# Patient Record
Sex: Female | Born: 1955
Health system: Southern US, Community
[De-identification: ages and names within clinical notes are randomized; demographics above are authoritative.]

## PROBLEM LIST (undated history)

## (undated) DIAGNOSIS — A048 Other specified bacterial intestinal infections: Secondary | ICD-10-CM

## (undated) DIAGNOSIS — H9313 Tinnitus, bilateral: Secondary | ICD-10-CM

## (undated) DIAGNOSIS — Z85038 Personal history of other malignant neoplasm of large intestine: Secondary | ICD-10-CM

## (undated) DIAGNOSIS — M5030 Other cervical disc degeneration, unspecified cervical region: Secondary | ICD-10-CM

## (undated) DIAGNOSIS — K2100 Gastro-esophageal reflux disease with esophagitis, without bleeding: Secondary | ICD-10-CM

## (undated) DIAGNOSIS — S5290XA Unspecified fracture of unspecified forearm, initial encounter for closed fracture: Secondary | ICD-10-CM

## (undated) DIAGNOSIS — C50919 Malignant neoplasm of unspecified site of unspecified female breast: Secondary | ICD-10-CM

## (undated) DIAGNOSIS — C189 Malignant neoplasm of colon, unspecified: Secondary | ICD-10-CM

## (undated) DIAGNOSIS — M51369 Other intervertebral disc degeneration, lumbar region without mention of lumbar back pain or lower extremity pain: Secondary | ICD-10-CM

## (undated) DIAGNOSIS — H409 Unspecified glaucoma: Secondary | ICD-10-CM

## (undated) DIAGNOSIS — K648 Other hemorrhoids: Secondary | ICD-10-CM

## (undated) DIAGNOSIS — M5136 Other intervertebral disc degeneration, lumbar region: Secondary | ICD-10-CM

## (undated) DIAGNOSIS — E785 Hyperlipidemia, unspecified: Secondary | ICD-10-CM

## (undated) DIAGNOSIS — Z86010 Personal history of colonic polyps: Secondary | ICD-10-CM

## (undated) DIAGNOSIS — M47814 Spondylosis without myelopathy or radiculopathy, thoracic region: Secondary | ICD-10-CM

## (undated) HISTORY — DX: Personal history of other malignant neoplasm of large intestine: Z85.038

## (undated) HISTORY — DX: Malignant neoplasm of unspecified site of unspecified female breast: C50.919

## (undated) HISTORY — DX: Gastro-esophageal reflux disease with esophagitis: K21.0

## (undated) HISTORY — PX: TONSILLECTOMY: SUR1361

## (undated) HISTORY — DX: Malignant neoplasm of colon, unspecified: C18.9

## (undated) HISTORY — DX: Gastro-esophageal reflux disease with esophagitis, without bleeding: K21.00

## (undated) HISTORY — DX: Other specified bacterial intestinal infections: A04.8

## (undated) HISTORY — DX: Other hemorrhoids: K64.8

## (undated) HISTORY — DX: Unspecified glaucoma: H40.9

---

## 1998-12-06 ENCOUNTER — Other Ambulatory Visit: Admission: RE | Admit: 1998-12-06 | Discharge: 1998-12-06 | Payer: Self-pay | Admitting: Obstetrics and Gynecology

## 1999-07-05 ENCOUNTER — Inpatient Hospital Stay (HOSPITAL_COMMUNITY): Admission: AD | Admit: 1999-07-05 | Discharge: 1999-07-07 | Payer: Self-pay | Admitting: Obstetrics and Gynecology

## 1999-08-06 ENCOUNTER — Other Ambulatory Visit: Admission: RE | Admit: 1999-08-06 | Discharge: 1999-08-06 | Payer: Self-pay | Admitting: Obstetrics and Gynecology

## 2000-08-12 ENCOUNTER — Other Ambulatory Visit: Admission: RE | Admit: 2000-08-12 | Discharge: 2000-08-12 | Payer: Self-pay | Admitting: Obstetrics and Gynecology

## 2000-08-21 ENCOUNTER — Encounter: Admission: RE | Admit: 2000-08-21 | Discharge: 2000-08-21 | Payer: Self-pay | Admitting: Obstetrics and Gynecology

## 2000-08-21 ENCOUNTER — Encounter: Payer: Self-pay | Admitting: Obstetrics and Gynecology

## 2001-09-13 ENCOUNTER — Other Ambulatory Visit: Admission: RE | Admit: 2001-09-13 | Discharge: 2001-09-13 | Payer: Self-pay | Admitting: Obstetrics and Gynecology

## 2001-12-08 ENCOUNTER — Encounter: Admission: RE | Admit: 2001-12-08 | Discharge: 2001-12-08 | Payer: Self-pay | Admitting: Obstetrics and Gynecology

## 2001-12-08 ENCOUNTER — Encounter: Payer: Self-pay | Admitting: Obstetrics and Gynecology

## 2002-09-26 ENCOUNTER — Other Ambulatory Visit: Admission: RE | Admit: 2002-09-26 | Discharge: 2002-09-26 | Payer: Self-pay | Admitting: Obstetrics and Gynecology

## 2003-05-08 ENCOUNTER — Encounter: Admission: RE | Admit: 2003-05-08 | Discharge: 2003-05-08 | Payer: Self-pay | Admitting: Obstetrics and Gynecology

## 2003-09-22 ENCOUNTER — Encounter: Payer: Self-pay | Admitting: Internal Medicine

## 2003-09-22 DIAGNOSIS — K648 Other hemorrhoids: Secondary | ICD-10-CM | POA: Insufficient documentation

## 2003-12-13 ENCOUNTER — Other Ambulatory Visit: Admission: RE | Admit: 2003-12-13 | Discharge: 2003-12-13 | Payer: Self-pay | Admitting: Obstetrics and Gynecology

## 2004-07-07 DIAGNOSIS — S5290XA Unspecified fracture of unspecified forearm, initial encounter for closed fracture: Secondary | ICD-10-CM

## 2004-07-07 HISTORY — DX: Unspecified fracture of unspecified forearm, initial encounter for closed fracture: S52.90XA

## 2004-07-07 HISTORY — PX: WRIST FRACTURE SURGERY: SHX121

## 2004-09-15 ENCOUNTER — Emergency Department (HOSPITAL_COMMUNITY): Admission: EM | Admit: 2004-09-15 | Discharge: 2004-09-15 | Payer: Self-pay | Admitting: Emergency Medicine

## 2005-09-02 ENCOUNTER — Encounter: Admission: RE | Admit: 2005-09-02 | Discharge: 2005-09-02 | Payer: Self-pay | Admitting: Obstetrics and Gynecology

## 2006-12-15 ENCOUNTER — Encounter: Admission: RE | Admit: 2006-12-15 | Discharge: 2006-12-15 | Payer: Self-pay | Admitting: Obstetrics and Gynecology

## 2007-05-11 ENCOUNTER — Ambulatory Visit: Payer: Self-pay | Admitting: Internal Medicine

## 2007-05-11 LAB — CONVERTED CEMR LAB
ALT: 20 units/L (ref 0–35)
Albumin: 3.4 g/dL — ABNORMAL LOW (ref 3.5–5.2)
Alkaline Phosphatase: 66 units/L (ref 39–117)
Lipase: 27 units/L (ref 11.0–59.0)

## 2007-05-17 ENCOUNTER — Ambulatory Visit: Payer: Self-pay | Admitting: Internal Medicine

## 2007-05-17 ENCOUNTER — Encounter: Payer: Self-pay | Admitting: Internal Medicine

## 2007-05-17 DIAGNOSIS — K2100 Gastro-esophageal reflux disease with esophagitis, without bleeding: Secondary | ICD-10-CM | POA: Insufficient documentation

## 2007-08-16 ENCOUNTER — Ambulatory Visit: Payer: Self-pay | Admitting: Internal Medicine

## 2007-08-27 DIAGNOSIS — K219 Gastro-esophageal reflux disease without esophagitis: Secondary | ICD-10-CM | POA: Insufficient documentation

## 2007-08-27 DIAGNOSIS — A048 Other specified bacterial intestinal infections: Secondary | ICD-10-CM | POA: Insufficient documentation

## 2007-12-22 ENCOUNTER — Encounter: Admission: RE | Admit: 2007-12-22 | Discharge: 2007-12-22 | Payer: Self-pay | Admitting: Obstetrics and Gynecology

## 2009-01-17 ENCOUNTER — Encounter: Admission: RE | Admit: 2009-01-17 | Discharge: 2009-01-17 | Payer: Self-pay | Admitting: Obstetrics and Gynecology

## 2010-06-04 ENCOUNTER — Encounter: Admission: RE | Admit: 2010-06-04 | Discharge: 2010-06-04 | Payer: Self-pay | Admitting: Obstetrics and Gynecology

## 2010-09-25 ENCOUNTER — Encounter: Payer: Self-pay | Admitting: Internal Medicine

## 2010-10-03 NOTE — Letter (Signed)
Summary: Colonoscopy Letter  [facility]  [address] [place], [place] [address]   Phone: [phone]  Fax: [phone]      September 25, 2010 MRN: [id number]   Wendy [name] [address] [place], [place]  [address]   Dear Ms. Hafford,   According to your medical record, it is time for you to schedule a Colonoscopy. The American Cancer Society recommends this procedure as a method to detect early colon cancer. Patients with a family history of colon cancer, or a personal history of colon polyps or inflammatory bowel disease are at increased risk.  This letter has been generated based on the recommendations made at the time of your procedure. If you feel that in your particular situation this may no longer apply, please contact our office.  Please call our office at [phone] to schedule this appointment or to update your records at your earliest convenience.  Thank you for cooperating with [place] to provide you with the very best care possible.   Sincerely,  Hedwig Morton. Juanda Chance, M.D.  [facility] [phone]

## 2010-11-19 NOTE — Assessment & Plan Note (Signed)
[facility]                         GASTROENTEROLOGY OFFICE NOTE   BERNESE, DOFFING                     MRN:          [id number]  DATE:05/11/2007                            DOB:          02/16/1956    GASTROENTEROLOGY CONSULTATION   HISTORY OF PRESENT ILLNESS:  Ms. Scheu is a 55 year old female who is  referred by Dr. Neva Seat for evaluation of epigastric pain of several  months' duration.  Also, it is somewhat right upper quadrant without  radiation to the back.  She was treated for positive H. Pylori antibody  in August of 2008 with double therapy of Biaxin and Amoxicillin.  The  therapy, which lasted two weeks, did not have any impact on the presence  of her pain.  She denies dysphagia, symptoms of reflux, or change in  bowel habits.  The pain is sometimes  crampy and develops within two to  three hours of waking up in the  morning.  It may feel hot and it may  feel rather sensitive.  It is not associated with meals.  She denies any  food intolerance.  There is no family history of gallbladder disease.  Nexium did not help the pain.   MEDICATIONS:  1. Birth control pills.  2. Nexium 40 mg p.o. daily was discontinued.   PAST MEDICAL HISTORY:  Significant for colon cancer in her maternal  grandmother.  She underwent colonoscopy in March of 2005 by me which was  essentially normal.  Her past history is significant for tonsillectomy  and Cesarean section.  She has left arm fracture.   FAMILY HISTORY:  Positive for diabetes in mother and father.  Heart  disease in father.  Colon cancer in maternal grandmother.   SOCIAL HISTORY:  She is divorced with two boys; one of them died two  years ago in a motor vehicle accident.  She is a Engineer, civil (consulting).  She does not  drink alcohol.  She does not smoke.   REVIEW OF SYSTEMS:  Positive for  sleeping problems.   PHYSICAL EXAMINATION:  VITAL SIGNS:  Blood pressure  118/82. Pulse 64.  Weight 201 pounds.  GENERAL  APPEARANCE:  She was in no distress.  NECK:  Supple without lymphadenopathy.  LUNGS:  Clear to auscultation.  CARDIOVASCULAR: Normal S1 and S2.  ABDOMEN:  Soft.  Tender in the midline above the umbilicus without  radiation to left or right upper quadrant.  Liver edge was at costal  margin and there was no tenderness in the right upper quadrant.  No  costovertebral angle tenderness.  Lower abdomen was normal.  There were  no scars.  EXTREMITIES:  No edema.   IMPRESSION:  1. This is a 55 year old female with epigastric discomfort, not      responsive to H. Pylori treatment or acid-suppressing agents.  Rule      out spasm.  Rule out nonfunctional dyspepsia.  Rule out biliary      disease.  The patient thinks she had an upper abdominal ultrasound      in the past which showed normal  gallbladder.  There is no family  history of gallbladder disease.  2. Positive H. Pylori antibody, treated in August 2008.  3. Positive family history of colon cancer in grandparent.  She had a      normal colonoscopy in 2005.  4. Slightly abnormal serum amylase on testing by Dr. Neva Seat showing a      level of 107.   PLAN:  1. Upper endoscopy scheduled.  We will repeat the H. Pylori test.  2. Begin Levbid 0.375 mg p.o. b.i.d. for antispasmodic effect.  3. Depending upon the results of the endoscopy, she may need further      evaluation, repeat serum amylase level     Dora M. Juanda Chance, MD  Electronically Signed    DMB/MedQ  DD: 05/11/2007  DT: 05/12/2007  Job #: [id number]   cc:   Silas Sacramento, M.D.

## 2010-11-19 NOTE — Assessment & Plan Note (Signed)
[facility]                         GASTROENTEROLOGY OFFICE NOTE   Wendy, [name]                     MRN:          [id number]  DATE:08/16/2007                            DOB:          06/01/1956    HISTORY:  [name] is a 55 year old African/American female with a  history of gastroesophageal reflux.  She was treated for H. pylori in  August 2008.  An upper endoscopy in November 2008, showed mild reflux  esophagitis.  Her H. pylori at that time was negative.  She has switched  from Nexium to Protonix 40 mg daily, with good results.  She is  currently essentially asymptomatic.   PHYSICAL EXAMINATION:  VITAL SIGNS:  Blood pressure 130/82, pulse 72,  weight 201 pounds.  The patient was not examined today.   She has felt quite satisfied with her progress and was asking as to  whether she should continue the Protonix indefinitely, or try to taper  it off.  My recommendation is for her to stay on the Protonix daily for  another month, and then slowly taper to three times a week, down to a  two times a week regimen.  In the future, Dr. Asencion Partridge. Neva Seat could  refill her medication. __________ in one year.     Hedwig Morton. Juanda Chance, MD  Electronically Signed    DMB/MedQ  DD: 08/16/2007  DT: 08/17/2007  Job #: [id number]   cc:   Shade Flood, M.D.

## 2010-11-22 NOTE — H&P (Signed)
[facility]  Patient:    Wendy [name]                        MRN: [id number] Adm. Date:  [id number] Attending:  Lenoard Aden CC:         Lenoard Aden, M.D.                         History and Physical  CHIEF COMPLAINT:  Labor.  HISTORY OF PRESENT ILLNESS:  The patient is a 55 year old black female, G3, P0-1-1-1, EDD July 16, 1999 at 38+ weeks who presents in active labor.  PAST OBSTETRIC HISTORY:  Remarkable for preterm premature rupture of membranes nd delivery of 4 pound fetus at 28 weeks by emergency cesarean section.  History of uncomplicated miscarriage in February 2000.  ALLERGIES:  No known drug allergies.  MEDICATIONS ON ADMISSION:  Prenatal vitamins and iron.  FAMILY HISTORY:  Remarkable for hypertension, phlebitis, diabetes, lung cancer.  The patient has a personal history of wisdom tooth removal and tonsillectomy and previous cesarean section.  GYN HISTORY:  Remarkable for HSV and Chlamydia.  PRENATAL LAB DATA:  Remarkable for blood type B+, Rh antibody negative, sickle ell trait normal.  VDRL nonreactive.  Rubella immune.  Hepatitis B surface antigen negative.  HIV nonreactive.  Prenatal course complicated by preterm cervical change and early positive GBS which was treated antepartum and patient had otherwise successful amniocentesis for advanced maternal age.  PHYSICAL EXAMINATION:  GENERAL:  Well-developed, well-nourished white female in no apparent distress.  HEENT:  Normal.  LUNGS:  Clear.  HEART:  Regular rate and rhythm.  ABDOMEN:  Soft, gravid, nontender.  Estimated fetal weight on ultrasound about  pounds.  Cervix is 4 cm, 100% vertex and 0 to -1.  Artificial rupture of membranes reveals meconium stained fluid.  EXTREMITIES:  No cords.  NEUROLOGIC:  Nonfocal.  Fetal heart rate was reactive.  IMPRESSION: 1. 39-week intrauterine pregnancy and active labor. 2. Previous cesarean section  for VBAC.  PLAN:  Proceed with careful monitoring, DeLee suctioning and delivery.  Peds in  attendance, anticipate vaginal delivery. DD:  07/05/99 TD:  07/05/99 Job: [id number]

## 2011-07-08 DIAGNOSIS — H9313 Tinnitus, bilateral: Secondary | ICD-10-CM

## 2011-07-08 HISTORY — DX: Tinnitus, bilateral: H93.13

## 2011-07-25 ENCOUNTER — Encounter (INDEPENDENT_AMBULATORY_CARE_PROVIDER_SITE_OTHER): Payer: BC Managed Care – PPO | Admitting: Family Medicine

## 2011-07-25 DIAGNOSIS — Z0000 Encounter for general adult medical examination without abnormal findings: Secondary | ICD-10-CM

## 2011-07-25 DIAGNOSIS — H61109 Unspecified noninfective disorders of pinna, unspecified ear: Secondary | ICD-10-CM

## 2011-08-14 ENCOUNTER — Other Ambulatory Visit: Payer: Self-pay | Admitting: Otolaryngology

## 2011-08-18 ENCOUNTER — Ambulatory Visit
Admission: RE | Admit: 2011-08-18 | Discharge: 2011-08-18 | Disposition: A | Discharge status: Home / Self Care | Payer: Self-pay | Source: Ambulatory Visit | Attending: Otolaryngology | Admitting: Otolaryngology

## 2011-08-18 MED ORDER — GADOBENATE DIMEGLUMINE 529 MG/ML IV SOLN
20.0000 mL | Freq: Once | INTRAVENOUS | Status: AC | PRN
  Administered 2011-08-18: 20 mL via INTRAVENOUS

## 2011-09-29 ENCOUNTER — Other Ambulatory Visit: Payer: Self-pay | Admitting: Obstetrics and Gynecology

## 2011-09-29 DIAGNOSIS — Z1231 Encounter for screening mammogram for malignant neoplasm of breast: Secondary | ICD-10-CM

## 2011-10-07 ENCOUNTER — Ambulatory Visit
Admission: RE | Admit: 2011-10-07 | Discharge: 2011-10-07 | Disposition: A | Discharge status: Home / Self Care | Payer: Self-pay | Source: Ambulatory Visit | Attending: Obstetrics and Gynecology | Admitting: Obstetrics and Gynecology

## 2011-10-07 DIAGNOSIS — Z1231 Encounter for screening mammogram for malignant neoplasm of breast: Secondary | ICD-10-CM

## 2011-10-12 ENCOUNTER — Telehealth: Payer: Self-pay | Admitting: Family Medicine

## 2011-10-12 NOTE — Telephone Encounter (Signed)
Patient states that Dr Neva Seat wanted her to return to clinic just to get blood drawn for her cholesterol and would like for dr Neva Seat to put in an order for blood work only

## 2011-10-12 NOTE — Telephone Encounter (Signed)
Dr. Neva Seat, Chart in your box

## 2011-10-12 NOTE — Telephone Encounter (Signed)
Pull chart

## 2011-10-15 ENCOUNTER — Telehealth: Payer: Self-pay

## 2011-10-15 DIAGNOSIS — Z79899 Other long term (current) drug therapy: Secondary | ICD-10-CM

## 2011-10-15 DIAGNOSIS — E785 Hyperlipidemia, unspecified: Secondary | ICD-10-CM

## 2011-10-15 NOTE — Telephone Encounter (Signed)
Pt wants to know if Dr. Neva Seat wrote an order for her to have her cholesterol levels tested.   Two, she will bring in a letter for health assessment for completion.   Please call

## 2011-10-19 NOTE — Telephone Encounter (Signed)
See previous phone message. 

## 2011-10-20 NOTE — Telephone Encounter (Signed)
I need her chart and it is not in his box.  Please pull.  No order in epic as of now.

## 2011-10-20 NOTE — Telephone Encounter (Signed)
Please pull chart and put in PA phone message stack and then forward this back to PA pool.

## 2011-10-21 NOTE — Telephone Encounter (Signed)
I have entered an order for Lipid and ALT.  It's possible Dr. Neva Seat may need to see the pt but will start with lab first.

## 2011-10-21 NOTE — Telephone Encounter (Signed)
Gave pt info about labs being ordered. Pt agreed to come in for them

## 2011-10-22 ENCOUNTER — Other Ambulatory Visit (INDEPENDENT_AMBULATORY_CARE_PROVIDER_SITE_OTHER): Payer: BC Managed Care – PPO

## 2011-10-22 DIAGNOSIS — E785 Hyperlipidemia, unspecified: Secondary | ICD-10-CM

## 2011-10-22 DIAGNOSIS — E669 Obesity, unspecified: Secondary | ICD-10-CM

## 2011-10-22 DIAGNOSIS — Z833 Family history of diabetes mellitus: Secondary | ICD-10-CM

## 2011-10-23 LAB — COMPREHENSIVE METABOLIC PANEL
ALT: 11 U/L (ref 0–35)
Albumin: 3.8 g/dL (ref 3.5–5.2)
CO2: 24 mEq/L (ref 19–32)
Glucose, Bld: 96 mg/dL (ref 70–99)
Potassium: 4.2 mEq/L (ref 3.5–5.3)
Sodium: 141 mEq/L (ref 135–145)
Total Protein: 6.8 g/dL (ref 6.0–8.3)

## 2011-10-23 LAB — LIPID PANEL
Cholesterol: 206 mg/dL — ABNORMAL HIGH (ref 0–200)
LDL Cholesterol: 120 mg/dL — ABNORMAL HIGH (ref 0–99)
Triglycerides: 69 mg/dL (ref <150)

## 2011-10-28 NOTE — Telephone Encounter (Signed)
Pt calling clinical nurse back about lab results and trying to find out about if ppwk that she gave is filled out.

## 2011-12-31 ENCOUNTER — Telehealth: Payer: Self-pay

## 2011-12-31 NOTE — Telephone Encounter (Signed)
Pt had dropped off a [facility] [employer] Screening form to be filled out and signed by MD. Wendy [name] pt and LMOM that the form was completed and faxed w/confirmation, and that I left a copy in p/up drawer if she wants her copy.

## 2012-05-25 ENCOUNTER — Telehealth: Payer: Self-pay

## 2012-05-25 DIAGNOSIS — H409 Unspecified glaucoma: Secondary | ICD-10-CM

## 2012-05-25 NOTE — Telephone Encounter (Signed)
Please pull paper chart, pt not seen in epic

## 2012-05-25 NOTE — Telephone Encounter (Signed)
Pt is a patient of dr greene's that has glaucoma and would like for [place] to refer her to [facility] [phone]  Best number  [phone]

## 2012-05-25 NOTE — Telephone Encounter (Signed)
Chart pulled to PA pool at nurses station [phone]

## 2012-05-26 NOTE — Telephone Encounter (Signed)
Referral made 

## 2012-05-26 NOTE — Telephone Encounter (Signed)
Patient advised referral made.

## 2012-07-26 ENCOUNTER — Encounter: Payer: BC Managed Care – PPO | Admitting: Family Medicine

## 2012-08-02 ENCOUNTER — Encounter: Payer: Self-pay | Admitting: Family Medicine

## 2012-08-02 ENCOUNTER — Ambulatory Visit (INDEPENDENT_AMBULATORY_CARE_PROVIDER_SITE_OTHER): Payer: BC Managed Care – PPO | Admitting: Family Medicine

## 2012-08-02 VITALS — BP 132/82 | HR 68 | Temp 98.0°F | Resp 16 | Ht 63.5 in | Wt 205.8 lb

## 2012-08-02 DIAGNOSIS — Z0000 Encounter for general adult medical examination without abnormal findings: Secondary | ICD-10-CM

## 2012-08-02 LAB — COMPREHENSIVE METABOLIC PANEL
ALT: 19 U/L (ref 0–35)
AST: 27 U/L (ref 0–37)
Albumin: 4.2 g/dL (ref 3.5–5.2)
BUN: 11 mg/dL (ref 6–23)
CO2: 27 mEq/L (ref 19–32)
Calcium: 9.4 mg/dL (ref 8.4–10.5)
Chloride: 109 mEq/L (ref 96–112)
Creat: 0.67 mg/dL (ref 0.50–1.10)
Potassium: 4.5 mEq/L (ref 3.5–5.3)

## 2012-08-02 LAB — LIPID PANEL
Cholesterol: 238 mg/dL — ABNORMAL HIGH (ref 0–200)
Total CHOL/HDL Ratio: 3.5 Ratio

## 2012-08-02 LAB — CBC
HCT: 44.2 % (ref 36.0–46.0)
MCH: 28.1 pg (ref 26.0–34.0)
MCV: 83.2 fL (ref 78.0–100.0)
Platelets: 182 10*3/uL (ref 150–400)
RBC: 5.31 MIL/uL — ABNORMAL HIGH (ref 3.87–5.11)
WBC: 6.9 10*3/uL (ref 4.0–10.5)

## 2012-08-02 NOTE — Patient Instructions (Signed)
Your should receive a call or letter about your lab results within the next week to 10 days.  Call your gastroenterologist to make sure your repeat colonoscopy should be in 10 years, and call Dr. Billy Coast about need for Pap testing. Schedule mammogram in March.  Keeping You Healthy  Get These Tests  Blood Pressure- Have your blood pressure checked by your healthcare provider at least once a year.  Normal blood pressure is 120/80.  Weight- Have your body mass index (BMI) calculated to screen for obesity.  BMI is a measure of body fat based on height and weight.  You can calculate your own BMI at https://www.west-esparza.com/  Cholesterol- Have your cholesterol checked every year.  Diabetes- Have your blood sugar checked every year if you have high blood pressure, high cholesterol, a family history of diabetes or if you are overweight.  Pap Smear- Have a pap smear every 1 to 3 years if you have been sexually active.  If you are older than 65 and recent pap smears have been normal you may not need additional pap smears.  In addition, if you have had a hysterectomy  For benign disease additional pap smears are not necessary.  Mammogram-Yearly mammograms are essential for early detection of breast cancer  Screening for Colon Cancer- Colonoscopy starting at age 64. Screening may begin sooner depending on your family history and other health conditions.  Follow up colonoscopy as directed by your Gastroenterologist.  Screening for Osteoporosis- Screening begins at age 59 with bone density scanning, sooner if you are at higher risk for developing Osteoporosis.  Get these medicines  Calcium with Vitamin D- Your body requires 1200-1500 mg of Calcium a day and [phone] IU of Vitamin D a day.  You can only absorb 500 mg of Calcium at a time therefore Calcium must be taken in 2 or 3 separate doses throughout the day.  Hormones- Hormone therapy has been associated with increased risk for certain cancers and  heart disease.  Talk to your healthcare provider about if you need relief from menopausal symptoms.  Aspirin- Ask your healthcare provider about taking Aspirin to prevent Heart Disease and Stroke.  Get these Immuniztions  Flu shot- Every fall  Pneumonia shot- Once after the age of 70; if you are younger ask your healthcare provider if you need a pneumonia shot.  Tetanus- Every ten years.  Zostavax- Once after the age of 12 to prevent shingles.  Take these steps  Don't smoke- Your healthcare provider can help you quit. For tips on how to quit, ask your healthcare provider or go to www.smokefree.gov or call 1-800 QUIT-NOW.  Be physically active- Exercise 5 days a week for a minimum of 30 minutes.  If you are not already physically active, start slow and gradually work up to 30 minutes of moderate physical activity.  Try walking, dancing, bike riding, swimming, etc.  Eat a healthy diet- Eat a variety of healthy foods such as fruits, vegetables, whole grains, low fat milk, low fat cheeses, yogurt, lean meats, chicken, fish, eggs, dried beans, tofu, etc.  For more information go to www.thenutritionsource.org  Dental visit- Brush and floss teeth twice daily; visit your dentist twice a year.  Eye exam- Visit your Optometrist or Ophthalmologist yearly.  Drink alcohol in moderation- Limit alcohol intake to one drink or less a day.  Never drink and drive.  Depression- Your emotional health is as important as your physical health.  If you're feeling down or losing interest in things you  normally enjoy, please talk to your healthcare provider.  Seat Belts- can save your life; always wear one  Smoke/Carbon Monoxide detectors- These detectors need to be installed on the appropriate level of your home.  Replace batteries at least once a year.  Violence- If anyone is threatening or hurting you, please tell your healthcare provider.  Living Will/ Health care power of attorney- Discuss with your  healthcare provider and family.

## 2012-08-02 NOTE — Progress Notes (Signed)
  Subjective:    Patient ID: Wendy [name], female    DOB: 1955-11-16, 57 y.o.   MRN: [id number]  HPI    Review of Systems  Constitutional: Negative.   HENT: Positive for tinnitus.   Eyes: Negative.   Respiratory: Negative.   Cardiovascular: Negative.   Gastrointestinal: Negative.   Genitourinary: Negative.   Musculoskeletal: Negative.   Skin: Negative.   Neurological: Negative.   Hematological: Negative.   Psychiatric/Behavioral: Positive for dysphoric mood.       Occasional       Objective:   Physical Exam        Assessment & Plan:

## 2012-08-02 NOTE — Progress Notes (Signed)
Subjective:    Patient ID: Wendy [name], female    DOB: 09/17/1955, 57 y.o.   MRN: [id number]  HPI Wendy [name] is a 57 y.o. female Here for CPE. Flu vaccine 10/13.  Colonoscopy 5 years ago.  Mammogram 3/13 -normal tdap 8 years ago.  Will be scheduling Pap at Dr. Billy Coast - normal in 2012. (may be able to delay if normal prior - she will call their office). Last colonoscopy 2008 - normal - plan to repeat in 10 years, but she will call to verify this.   Still with [employer] RN.   appt at [facility] - FEb 11th for Glaucoma - currently followed by [facility] optho.   Brother with CHF, father with CAD in 59's but no intervention.  Hyperlipidemia - LDL 139 07/25/11. Lab only visit in April 2013.  Results for orders placed in visit on 10/22/11  COMPREHENSIVE METABOLIC PANEL      Component Value Range   Sodium 141  135 - 145 mEq/L   Potassium 4.2  3.5 - 5.3 mEq/L   Chloride 109  96 - 112 mEq/L   CO2 24  19 - 32 mEq/L   Glucose, Bld 96  70 - 99 mg/dL   BUN 11  6 - 23 mg/dL   Creat 1.47  8.29 - 5.62 mg/dL   Total Bilirubin 0.5  0.3 - 1.2 mg/dL   Alkaline Phosphatase 73  39 - 117 U/L   AST 18  0 - 37 U/L   ALT 11  0 - 35 U/L   Total Protein 6.8  6.0 - 8.3 g/dL   Albumin 3.8  3.5 - 5.2 g/dL   Calcium 9.1  8.4 - 13.0 mg/dL  LIPID PANEL      Component Value Range   Cholesterol 206 (*) 0 - 200 mg/dL   Triglycerides 69  <865 mg/dL   HDL 72  >78 mg/dL   Total CHOL/HDL Ratio 2.9     VLDL 14  0 - 40 mg/dL   LDL Cholesterol 469 (*) 0 - 99 mg/dL   Denies any depression.  Some stressful times, but no depressed mood, no anhedonia.   Tinnitus - followed by Dr. Dorma Russell, CT scan negative. No new medicines - not overly bothersome.    Review of Systems Per CMA note. Otherwise negative on 13 point ros from patient health survey.     Objective:   Physical Exam  Vitals reviewed. Constitutional: She is oriented to person, place, and time. She appears well-developed and well-nourished.    HENT:  Head: Normocephalic and atraumatic.  Right Ear: External ear normal.  Left Ear: External ear normal.  Mouth/Throat: Oropharynx is clear and moist.  Eyes: Conjunctivae normal are normal. Pupils are equal, round, and reactive to light.  Neck: Normal range of motion. Neck supple. No thyromegaly present.  Cardiovascular: Normal rate, regular rhythm, normal heart sounds and intact distal pulses.   No murmur heard. Pulmonary/Chest: Effort normal and breath sounds normal. No respiratory distress. She has no wheezes.  Abdominal: Soft. Bowel sounds are normal. There is no tenderness.  Musculoskeletal: Normal range of motion. She exhibits no edema and no tenderness.  Lymphadenopathy:    She has no cervical adenopathy.  Neurological: She is alert and oriented to person, place, and time.  Skin: Skin is warm and dry. No rash noted.  Psychiatric: She has a normal mood and affect. Her behavior is normal. Thought content normal.       Assessment & Plan:  Wendy [name] is a 57 y.o. female 1. Annual physical exam  Comprehensive metabolic panel, Lipid panel, CBC  labs above, anticipatory guidance reviewed as below.  Plans on follow up with OBGYN to discuss pap testing. Also to clarify follow up schedule for colonoscopy, and to schedule mammogram.  Prior borderline hyperlipidemia - recheck labs.   Patient Instructions  Your should receive a call or letter about your lab results within the next week to 10 days.  Call your gastroenterologist to make sure your repeat colonoscopy should be in 10 years, and call Dr. Billy Coast about need for Pap testing. Schedule mammogram in March.  Keeping You Healthy  Get These Tests  Blood Pressure- Have your blood pressure checked by your healthcare provider at least once a year.  Normal blood pressure is 120/80.  Weight- Have your body mass index (BMI) calculated to screen for obesity.  BMI is a measure of body fat based on height and weight.  You can  calculate your own BMI at https://www.west-esparza.com/  Cholesterol- Have your cholesterol checked every year.  Diabetes- Have your blood sugar checked every year if you have high blood pressure, high cholesterol, a family history of diabetes or if you are overweight.  Pap Smear- Have a pap smear every 1 to 3 years if you have been sexually active.  If you are older than 65 and recent pap smears have been normal you may not need additional pap smears.  In addition, if you have had a hysterectomy  For benign disease additional pap smears are not necessary.  Mammogram-Yearly mammograms are essential for early detection of breast cancer  Screening for Colon Cancer- Colonoscopy starting at age 41. Screening may begin sooner depending on your family history and other health conditions.  Follow up colonoscopy as directed by your Gastroenterologist.  Screening for Osteoporosis- Screening begins at age 44 with bone density scanning, sooner if you are at higher risk for developing Osteoporosis.  Get these medicines  Calcium with Vitamin D- Your body requires 1200-1500 mg of Calcium a day and [phone] IU of Vitamin D a day.  You can only absorb 500 mg of Calcium at a time therefore Calcium must be taken in 2 or 3 separate doses throughout the day.  Hormones- Hormone therapy has been associated with increased risk for certain cancers and heart disease.  Talk to your healthcare provider about if you need relief from menopausal symptoms.  Aspirin- Ask your healthcare provider about taking Aspirin to prevent Heart Disease and Stroke.  Get these Immuniztions  Flu shot- Every fall  Pneumonia shot- Once after the age of 35; if you are younger ask your healthcare provider if you need a pneumonia shot.  Tetanus- Every ten years.  Zostavax- Once after the age of 40 to prevent shingles.  Take these steps  Don't smoke- Your healthcare provider can help you quit. For tips on how to quit, ask your healthcare  provider or go to www.smokefree.gov or call 1-800 QUIT-NOW.  Be physically active- Exercise 5 days a week for a minimum of 30 minutes.  If you are not already physically active, start slow and gradually work up to 30 minutes of moderate physical activity.  Try walking, dancing, bike riding, swimming, etc.  Eat a healthy diet- Eat a variety of healthy foods such as fruits, vegetables, whole grains, low fat milk, low fat cheeses, yogurt, lean meats, chicken, fish, eggs, dried beans, tofu, etc.  For more information go to www.thenutritionsource.org  Dental visit- Brush  and floss teeth twice daily; visit your dentist twice a year.  Eye exam- Visit your Optometrist or Ophthalmologist yearly.  Drink alcohol in moderation- Limit alcohol intake to one drink or less a day.  Never drink and drive.  Depression- Your emotional health is as important as your physical health.  If you're feeling down or losing interest in things you normally enjoy, please talk to your healthcare provider.  Seat Belts- can save your life; always wear one  Smoke/Carbon Monoxide detectors- These detectors need to be installed on the appropriate level of your home.  Replace batteries at least once a year.  Violence- If anyone is threatening or hurting you, please tell your healthcare provider.  Living Will/ Health care power of attorney- Discuss with your healthcare provider and family.

## 2012-08-10 ENCOUNTER — Telehealth: Payer: Self-pay

## 2012-08-10 DIAGNOSIS — E785 Hyperlipidemia, unspecified: Secondary | ICD-10-CM

## 2012-08-10 NOTE — Telephone Encounter (Signed)
Pt called back and I notified her that she can come in for fasting labs only in about 3 mos. Discussed diet exercise for lowering chol.

## 2012-08-10 NOTE — Telephone Encounter (Signed)
LMOM to CB. 

## 2012-08-10 NOTE — Telephone Encounter (Signed)
That is fine.  I will put in the orders for her lipids, CMP for fasting labs in about next 3 months.

## 2012-08-10 NOTE — Telephone Encounter (Signed)
Dr. Neva Seat: patient called to make appt. With you for repeat cholesterol labs.  However, patient doesn't want to actually have an appointment with you, she simply wants to come in sometime in April and have her labs rechecked in the morning.  Please advise if you can put in lab orders for this or if the patient needs to be seen. Thank you! The patients number is [phone]

## 2012-08-10 NOTE — Telephone Encounter (Signed)
PATIENT CALLED AND STATED SHE RECEIVED A MESSAGE TO CALL [facility] ABOUT SOME TEST RESULTS.  PLEASE CALL [phone]

## 2012-08-10 NOTE — Telephone Encounter (Signed)
Lab has already been in contact with her. See lab

## 2012-10-12 ENCOUNTER — Other Ambulatory Visit: Payer: Self-pay

## 2012-10-12 DIAGNOSIS — Z1231 Encounter for screening mammogram for malignant neoplasm of breast: Secondary | ICD-10-CM

## 2012-11-08 ENCOUNTER — Ambulatory Visit
Admission: RE | Admit: 2012-11-08 | Discharge: 2012-11-08 | Disposition: A | Discharge status: Home / Self Care | Payer: BC Managed Care – PPO | Source: Ambulatory Visit

## 2012-11-08 DIAGNOSIS — Z1231 Encounter for screening mammogram for malignant neoplasm of breast: Secondary | ICD-10-CM

## 2013-02-14 ENCOUNTER — Encounter: Payer: Self-pay | Admitting: Family Medicine

## 2013-02-14 ENCOUNTER — Ambulatory Visit (INDEPENDENT_AMBULATORY_CARE_PROVIDER_SITE_OTHER): Payer: BC Managed Care – PPO | Admitting: Family Medicine

## 2013-02-14 VITALS — BP 118/66 | HR 65 | Temp 98.3°F | Resp 16 | Ht 63.5 in | Wt 207.0 lb

## 2013-02-14 DIAGNOSIS — M25569 Pain in unspecified knee: Secondary | ICD-10-CM

## 2013-02-14 DIAGNOSIS — M222X9 Patellofemoral disorders, unspecified knee: Secondary | ICD-10-CM

## 2013-02-14 MED ORDER — MELOXICAM 7.5 MG PO TABS: 7.5000 mg | ORAL_TABLET | Freq: Every day | ORAL | Status: DC

## 2013-02-14 NOTE — Progress Notes (Signed)
  Subjective:    Patient ID: BAHJA BENCE, female    DOB: 03-Aug-1955, 57 y.o.   MRN: [id number]  HPI DANIAL SISLEY is a 57 y.o. female  Here for myalgias/arthralgias.  Started about 2 weeks ago - knees and calves intially, then some in thighs - both sides.  No swelling. No known change in activities. NKI.  Mostly knees and calves - sometimes to thighs. Ache sensation. No prior similar sx's.  Same intensity. Sporadic sx's.  Stiffness in am or after sitting for long time.  No chest pain. No shortness of breath. No fever, no rash. Stress with work/home - relocated to different home. Lost close friend few weeks ago. Some trouble staying asleep - tired at times. Not depressed  No recent prolonged car travel or air travel, no calf swelling. Had rash earlier this summer on hands only - has resolved.  Busy with work, has been packing up house and moving month or so, into new house 2 weeks ago. Now with stairs and 2nd floor. Pain with going down stairs in front of knee.   Tx: ibuprofen, alleve, tylenol. Helps temporarily.   No recent GERD, no hx PUD.   Review of Systems  Constitutional: Positive for fatigue. Negative for fever, chills, activity change and unexpected weight change.       Objective:   Physical Exam  Vitals reviewed. Constitutional: She appears well-developed and well-nourished. No distress.  Musculoskeletal: Normal range of motion.       Right knee: She exhibits normal range of motion, no swelling, no effusion and normal patellar mobility. Tenderness (clark testing/patellar grind. ) found.       Left knee: She exhibits normal range of motion, no swelling, no effusion and normal patellar mobility. Tenderness (patellar grind. ) found.       Right upper leg: She exhibits no tenderness.       Left upper leg: She exhibits no tenderness.       Right lower leg: Normal. She exhibits no swelling.       Left lower leg: Normal. She exhibits no swelling.       Assessment & Plan:   JEFFERY GAMMELL is a 57 y.o. female Knee pain, acute, unspecified laterality - Plan: meloxicam (MOBIC) 7.5 MG tablet  Patellofemoral pain syndrome, unspecified laterality - Plan: meloxicam (MOBIC) 7.5 MG tablet  Suspect PFPS and other myalgias with recent move, and change in activity. Trial of HEP by handout, tylenol as needed, sx care. rtc precautions.   Patient Instructions  Try the exercises, mobic, heat or ice as needed.  If not improving in next month - recheck. Return to the clinic or go to the nearest emergency room if any of your symptoms worsen or new symptoms occur.

## 2013-02-14 NOTE — Patient Instructions (Signed)
Try the exercises, mobic, heat or ice as needed.  If not improving in next month - recheck. Return to the clinic or go to the nearest emergency room if any of your symptoms worsen or new symptoms occur.

## 2013-03-04 ENCOUNTER — Ambulatory Visit (INDEPENDENT_AMBULATORY_CARE_PROVIDER_SITE_OTHER): Payer: BC Managed Care – PPO | Admitting: Family Medicine

## 2013-03-04 ENCOUNTER — Ambulatory Visit: Payer: BC Managed Care – PPO

## 2013-03-04 ENCOUNTER — Encounter: Payer: Self-pay | Admitting: Family Medicine

## 2013-03-04 VITALS — BP 138/90 | HR 65 | Temp 98.4°F | Resp 16 | Ht 64.0 in | Wt 207.0 lb

## 2013-03-04 DIAGNOSIS — M25512 Pain in left shoulder: Secondary | ICD-10-CM

## 2013-03-04 DIAGNOSIS — R079 Chest pain, unspecified: Secondary | ICD-10-CM

## 2013-03-04 DIAGNOSIS — M25519 Pain in unspecified shoulder: Secondary | ICD-10-CM

## 2013-03-04 LAB — POCT SEDIMENTATION RATE: POCT SED RATE: 13 mm/hr (ref 0–22)

## 2013-03-04 MED ORDER — CYCLOBENZAPRINE HCL 10 MG PO TABS: 10.0000 mg | ORAL_TABLET | Freq: Three times a day (TID) | ORAL | Status: DC | PRN

## 2013-03-04 NOTE — Patient Instructions (Signed)
Do not use meloxicam with any other otc pain medication other than tylenol/acetaminophen - so no aleve, ibuprofen, motrin, advil, etc.  It is fine to take 1-2 tabs of tylenol p.m. at night to help sleep and pain.  Lets do a therapeutic trial of a muscle relaxant, cyclobenzaprine, to see if it helps. Try it at night first as it can make you sleepy.

## 2013-03-04 NOTE — Progress Notes (Signed)
Subjective:    Patient ID: Wendy [name], female    DOB: January 24, 1956, 57 y.o.   MRN: [id number] Chief Complaint  Patient presents with  . Pain chest, shoulder and arm    x 1wk   HPI  Intermittent pain over left chest going to left arm and left shoulder, down left arm and to left upper back.  Feels muscular and has tried ice/heat, but doesn't want to go away.  Finds a lot of stress which makes it worse. Tried aleve and tylenol w/o relief.  Is intermittent. Worse when lays on side, feels better when she lays flat on back. Not worse when she moves her arms or lifts anything.  Doing normal activity, not connected to exertion at all - just seems to come on out of the blue - not connected to anything.  After it comes all day will seem to last all day - and steadily worsen with nagging aching pain before fading away after many hours. Ice did help a little a night.  No neck pain.  No right shoulder pain.  Sometimes feels like someone is stabbing her in the back radiating through the chest well and a heaviness occasionally radiating to bicep sporadically.  Wonders if this all could be caused by the stress she has been under recently.  Was seen 2 weeks ago with leg pain - diagnosed with PTFS by Dr. Neva Seat and started on meloxicam which helped immensely.  Pt has no sig h/o MSK pain prior.  Mammogram done in April was nml.  Had stress test through [facility] cardiology around 2012 that was normal.  Past Medical History  Diagnosis Date  . Glaucoma    Allergies  Allergen Reactions  . Vicodin [Hydrocodone-Acetaminophen] Itching    SEVERE     Current Outpatient Prescriptions on File Prior to Visit  Medication Sig Dispense Refill  . dorzolamide (TRUSOPT) 2 % ophthalmic solution Place 1 drop into both eyes 3 (three) times daily.      Marland Kitchen latanoprost (XALATAN) 0.005 % ophthalmic solution Place 1 drop into both eyes at bedtime.      . meloxicam (MOBIC) 7.5 MG tablet Take 1 tablet (7.5 mg total) by mouth  daily.  30 tablet  0  . Multiple Vitamin (MULTIVITAMIN) tablet Take 1 tablet by mouth.      . fish oil-omega-3 fatty acids 1000 MG capsule Take 2 g by mouth daily.       No current facility-administered medications on file prior to visit.   Family History  Problem Relation Age of Onset  . Diabetes Mother   . Brain cancer Mother   . Cancer Mother     breast-right  . Diabetes Father   . Hypertension Father   . Cancer Father     lung  . Heart disease Father   . Diabetes Sister   . Hypertension Sister   . Glaucoma Sister   . Diabetes Brother   . Hypertension Brother   . Heart disease Brother     congestive heart failure  . Cancer Maternal Grandmother     colon    Review of Systems  Constitutional: Negative for fever, chills, diaphoresis, appetite change and fatigue.  Eyes: Negative for visual disturbance.  Respiratory: Negative for cough and shortness of breath.   Cardiovascular: Positive for chest pain. Negative for palpitations and leg swelling.  Genitourinary: Negative for decreased urine volume.  Musculoskeletal: Positive for myalgias and arthralgias. Negative for back pain and gait problem.  Skin: Negative for  rash.  Neurological: Negative for dizziness, syncope, light-headedness and headaches.  Hematological: Does not bruise/bleed easily.  Psychiatric/Behavioral: Positive for sleep disturbance.      BP 138/90  Pulse 65  Temp(Src) 98.4 F (36.9 C) (Oral)  Resp 16  Ht 5\' 4"  (1.626 m)  Wt 207 lb (93.895 kg)  BMI 35.51 kg/m2  SpO2 98% Objective:   Physical Exam  Constitutional: She is oriented to person, place, and time. She appears well-developed and well-nourished. No distress.  HENT:  Head: Normocephalic and atraumatic.  Right Ear: External ear normal.  Left Ear: External ear normal.  Eyes: Conjunctivae are normal. No scleral icterus.  Neck: Normal range of motion. Neck supple. No thyromegaly present.  Cardiovascular: Normal rate, regular rhythm, normal  heart sounds and intact distal pulses.   Pulmonary/Chest: Effort normal and breath sounds normal. No respiratory distress.  Musculoskeletal: She exhibits no edema.       Right shoulder: Normal. She exhibits normal range of motion, no tenderness and no bony tenderness.       Left shoulder: Normal. She exhibits normal range of motion, no tenderness, no bony tenderness, no swelling, no effusion, no crepitus, no pain, no spasm, normal pulse and normal strength.       Cervical back: Normal. She exhibits normal range of motion, no tenderness, no bony tenderness, no pain and no spasm.       Thoracic back: Normal. She exhibits normal range of motion, no tenderness, no bony tenderness, no pain and no spasm.  Lymphadenopathy:    She has no cervical adenopathy.  Neurological: She is alert and oriented to person, place, and time. She has normal strength. She displays no atrophy and no tremor. No sensory deficit. She exhibits normal muscle tone.  Reflex Scores:      Bicep reflexes are 1+ on the right side and 1+ on the left side.      Brachioradialis reflexes are 1+ on the right side and 1+ on the left side. Skin: Skin is warm and dry. She is not diaphoretic. No erythema.  Psychiatric: She has a normal mood and affect. Her behavior is normal.     [facility] reading (PRIMARY) by  Dr. Clelia Croft. CXR: No acute abnormality. Normal lung fields, Diaphragms slightly flattened, mild thoracic spondylosis. EKG: NSR, no ischemic changes Assessment & Plan:  Chest pain - Plan: Ambulatory referral to Cardiology, CBC with Differential, TSH, CK, C-reactive protein, Comprehensive metabolic panel, POCT SEDIMENTATION RATE, EKG 12-Lead, Troponin I - unknown etiology and MSK and neuro exam nml, will check stat trop now since pt is currently having the pain now in office but very reassured by nml EKG so do not suspect this is cardiac in etiology - however, as no other cause can be readily determined and she does have risk factors of age,  HPL, and fam hx will ask for urgent cardiac eval and if worsens -> call 911 and to ER.  Will do therapeutic trial of flexeril while labs and cardiology eval are pending.  Pain in joint, shoulder region, left - Plan: DG Chest 2 View  Meds ordered this encounter  Medications  . cyclobenzaprine (FLEXERIL) 10 MG tablet    Sig: Take 1 tablet (10 mg total) by mouth 3 (three) times daily as needed for muscle spasms.    Dispense:  30 tablet    Refill:  0

## 2013-03-04 NOTE — Addendum Note (Signed)
Addended by: Mervin Kung on: 03/04/2013 01:21 PM   Modules accepted: Orders

## 2013-03-05 ENCOUNTER — Encounter: Payer: Self-pay | Admitting: Family Medicine

## 2013-03-05 LAB — CBC WITH DIFFERENTIAL/PLATELET
Basophils Absolute: 0 10*3/uL (ref 0.0–0.1)
Basophils Relative: 0 % (ref 0–1)
Eosinophils Absolute: 0 10*3/uL (ref 0.0–0.7)
Eosinophils Relative: 1 % (ref 0–5)
HCT: 44.8 % (ref 36.0–46.0)
Hemoglobin: 14.9 g/dL (ref 12.0–15.0)
MCH: 27.6 pg (ref 26.0–34.0)
MCHC: 33.3 g/dL (ref 30.0–36.0)
MCV: 83.1 fL (ref 78.0–100.0)
Monocytes Absolute: 0.5 10*3/uL (ref 0.1–1.0)
Monocytes Relative: 7 % (ref 3–12)
RDW: 13.8 % (ref 11.5–15.5)

## 2013-03-05 LAB — COMPREHENSIVE METABOLIC PANEL
AST: 30 U/L (ref 0–37)
Albumin: 4.4 g/dL (ref 3.5–5.2)
BUN: 13 mg/dL (ref 6–23)
CO2: 28 mEq/L (ref 19–32)
Calcium: 9.4 mg/dL (ref 8.4–10.5)
Chloride: 105 mEq/L (ref 96–112)
Creat: 0.7 mg/dL (ref 0.50–1.10)
Potassium: 4.2 mEq/L (ref 3.5–5.3)

## 2013-03-05 LAB — CK: Total CK: 86 U/L (ref 7–177)

## 2013-03-08 ENCOUNTER — Encounter: Payer: Self-pay | Admitting: *Deleted

## 2013-03-08 ENCOUNTER — Telehealth: Payer: Self-pay

## 2013-03-08 NOTE — Telephone Encounter (Signed)
Pt has questions about why we went ahead with the referral to a cardiologist when dr Clelia Croft told her that everything came back fine  Best number [phone] pt has an appt with cardiologist on Wednesday.

## 2013-03-09 ENCOUNTER — Ambulatory Visit: Payer: BC Managed Care – PPO | Admitting: Cardiovascular Disease

## 2013-03-09 NOTE — Telephone Encounter (Signed)
From the testing we did in the office, I can be reassured that Wendy [name] has not had a heart attack recently - such as earlier that morning.  However, it does not tell us for sure whether her chest pains are signs of IMPENDING heart attack or the heart not getting enough blood (known as angina) without additional cardiology evaluation and stress test.

## 2013-03-09 NOTE — Telephone Encounter (Signed)
Thanks I have called her to advise. Left message for her to call me back.  

## 2013-03-12 NOTE — Telephone Encounter (Signed)
Called pt, explained message from Dr Clelia Croft. Pt understood.

## 2013-03-16 ENCOUNTER — Encounter: Payer: Self-pay | Admitting: Cardiovascular Disease

## 2013-08-11 DIAGNOSIS — H401190 Primary open-angle glaucoma, unspecified eye, stage unspecified: Secondary | ICD-10-CM | POA: Insufficient documentation

## 2013-11-22 ENCOUNTER — Other Ambulatory Visit: Payer: Self-pay

## 2013-11-22 DIAGNOSIS — Z1231 Encounter for screening mammogram for malignant neoplasm of breast: Secondary | ICD-10-CM

## 2013-11-29 ENCOUNTER — Ambulatory Visit
Admission: RE | Admit: 2013-11-29 | Discharge: 2013-11-29 | Disposition: A | Discharge status: Home / Self Care | Payer: BC Managed Care – PPO | Source: Ambulatory Visit

## 2013-11-29 DIAGNOSIS — Z1231 Encounter for screening mammogram for malignant neoplasm of breast: Secondary | ICD-10-CM

## 2013-11-30 ENCOUNTER — Ambulatory Visit: Payer: BC Managed Care – PPO

## 2014-04-11 ENCOUNTER — Ambulatory Visit (INDEPENDENT_AMBULATORY_CARE_PROVIDER_SITE_OTHER): Payer: BC Managed Care – PPO | Admitting: Family Medicine

## 2014-04-11 ENCOUNTER — Encounter: Payer: Self-pay | Admitting: Family Medicine

## 2014-04-11 ENCOUNTER — Ambulatory Visit: Payer: BC Managed Care – PPO | Admitting: Family Medicine

## 2014-04-11 VITALS — BP 142/78 | HR 65 | Temp 98.7°F | Resp 16 | Ht 63.5 in | Wt 216.8 lb

## 2014-04-11 DIAGNOSIS — R6884 Jaw pain: Secondary | ICD-10-CM

## 2014-04-11 MED ORDER — CYCLOBENZAPRINE HCL 10 MG PO TABS: ORAL_TABLET | ORAL | Status: DC

## 2014-04-11 NOTE — Progress Notes (Signed)
   Subjective:    Patient ID: Wendy [name], female    DOB: 15-Jul-1955, 58 y.o.   MRN: [id number]  HPI Patient presents today with right sided neck and jaw pain. It started 2 days ago and was worse last night. The pain has been steady with some tingling. She saw her dentist today and there was no problem found with her teeth (negative xrays/exam). She found a small pimple at the back of her hairline on the right yesterday and thought this might be related. Pain follows jaw up to ear and down neck. She has been grinding her teeth at night which is new for her. She has broken off a piece of her crown and will be having it fixed by her dentist next week.  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 She works long hours, not taking breaks to eat or get up from her desk.   She has regular care with her gyn. She admits that her diet and exercise need some work.  Review of Systems No fever, no chills, no sore throat, no pain with chewing, no popping, feels swollen, no headache.    Objective:   Physical Exam  Vitals reviewed. Constitutional: She is oriented to person, place, and time. She appears well-developed and well-nourished. She appears distressed.  HENT:  Head: Normocephalic and atraumatic.  Right Ear: Ear canal normal. No mastoid tenderness.  Left Ear: Tympanic membrane, external ear and ear canal normal. No mastoid tenderness.  Nose: Nose normal. Right sinus exhibits no maxillary sinus tenderness and no frontal sinus tenderness. Left sinus exhibits no maxillary sinus tenderness and no frontal sinus tenderness.  Mouth/Throat: Uvula is midline, oropharynx is clear and moist and mucous membranes are normal.  Right TM slightly dull. Normal sensation on face. No tenderness, popping with chewing.    Eyes: Conjunctivae and EOM are normal. Pupils are equal, round, and reactive to light.  Neck: Normal range of motion. Neck supple. No spinous process tenderness present. Muscular  tenderness: slightly tender along right jaw line. No edema, no erythema and normal range of motion present.  Cardiovascular: Normal rate, regular rhythm and normal heart sounds.   Pulmonary/Chest: Effort normal and breath sounds normal.  Musculoskeletal: Normal range of motion.  Lymphadenopathy:    She has no cervical adenopathy.  Neurological: She is alert and oriented to person, place, and time. No cranial nerve deficit.  Skin: Skin is warm and dry. She is not diaphoretic.  Psychiatric: She has a normal mood and affect. Her behavior is normal. Judgment and thought content normal.      Assessment & Plan:  1. Jaw pain -Not sure if this is related to new teeth grinding. She prefers to discuss mouth guard with her dentist next week - CBC with Differential - Sedimentation Rate - cyclobenzaprine (FLEXERIL) 10 MG tablet; Take 1/2- 1 tablet as needed at bedtime for muscle spasm/jaw clenching  Dispense: 20 tablet; Refill: 0 -ibuprofen 400-600 mg q8 hours prn pain -RTC if no improvement or if worsening symptoms.  Elby Beck, FNP-BC  [facility]  04/11/2014 4:50 PM

## 2014-04-11 NOTE — Patient Instructions (Signed)
Ibuprofen 2-3 tablets every 8-12 hours as needed Return to clinic if you have any worsening of your symptoms or if new symptoms occur   Teeth Grinding (Bruxism) Many people grind or clench their teeth during the day or night. Most are unaware they are doing it. Teeth grinding may be triggered by teeth being improperly aligned (malocclusion). Other causes may be:  Stress.  Worry.  Sleep disorders. Teeth grinding sometimes causes muscle spasms and headaches. Most often, these headaches are classified as tension type. Teeth grinding may also lead to:  Jaw problems (temporomandibular joint disorder [TMJ]).  Facial pain.  Damaged teeth. Teeth grinding is quite common in children, usually without pain. Most of the time it will disappear by adolescence. TREATMENT  Seek dental care if you or your child has:  Pain.  Trouble when opening the mouth.  Damage to teeth. A dentist often can ease those symptoms by fitting a small splint or mouth guard (appliance) to the upper or lower teeth. It is usually worn during sleep. Medications and other stress reduction techniques are sometimes used. Document Released: 06/26/2003 Document Revised: 09/15/2011 Document Reviewed: 09/19/2009 [facility] Patient Information 2015 [place], [place]. This information is not intended to replace advice given to you by your health care provider. Make sure you discuss any questions you have with your health care provider.

## 2014-04-12 LAB — CBC WITH DIFFERENTIAL/PLATELET
BASOS PCT: 0 % (ref 0–1)
Basophils Absolute: 0 10*3/uL (ref 0.0–0.1)
EOS ABS: 0.1 10*3/uL (ref 0.0–0.7)
Eosinophils Relative: 1 % (ref 0–5)
HEMATOCRIT: 43.6 % (ref 36.0–46.0)
HEMOGLOBIN: 14.7 g/dL (ref 12.0–15.0)
Lymphocytes Relative: 46 % (ref 12–46)
Lymphs Abs: 3.5 10*3/uL (ref 0.7–4.0)
MCH: 28 pg (ref 26.0–34.0)
MCHC: 33.7 g/dL (ref 30.0–36.0)
MCV: 83 fL (ref 78.0–100.0)
MONO ABS: 0.7 10*3/uL (ref 0.1–1.0)
MONOS PCT: 9 % (ref 3–12)
Neutro Abs: 3.4 10*3/uL (ref 1.7–7.7)
Neutrophils Relative %: 44 % (ref 43–77)
Platelets: 211 10*3/uL (ref 150–400)
RBC: 5.25 MIL/uL — ABNORMAL HIGH (ref 3.87–5.11)
RDW: 13.7 % (ref 11.5–15.5)
WBC: 7.7 10*3/uL (ref 4.0–10.5)

## 2014-04-12 LAB — SEDIMENTATION RATE: Sed Rate: 4 mm/hr (ref 0–22)

## 2014-04-29 ENCOUNTER — Other Ambulatory Visit: Payer: Self-pay | Admitting: Family Medicine

## 2014-05-01 NOTE — Telephone Encounter (Signed)
Debbie,do you want to RF for pt?

## 2014-11-16 ENCOUNTER — Other Ambulatory Visit: Payer: Self-pay

## 2014-11-16 DIAGNOSIS — Z1231 Encounter for screening mammogram for malignant neoplasm of breast: Secondary | ICD-10-CM

## 2014-12-01 ENCOUNTER — Ambulatory Visit
Admission: RE | Admit: 2014-12-01 | Discharge: 2014-12-01 | Disposition: A | Discharge status: Home / Self Care | Payer: BLUE CROSS/BLUE SHIELD | Source: Ambulatory Visit

## 2014-12-01 DIAGNOSIS — Z1231 Encounter for screening mammogram for malignant neoplasm of breast: Secondary | ICD-10-CM

## 2015-11-26 ENCOUNTER — Other Ambulatory Visit: Payer: Self-pay

## 2015-11-26 DIAGNOSIS — Z1231 Encounter for screening mammogram for malignant neoplasm of breast: Secondary | ICD-10-CM

## 2015-12-05 ENCOUNTER — Ambulatory Visit
Admission: RE | Admit: 2015-12-05 | Discharge: 2015-12-05 | Disposition: A | Discharge status: Home / Self Care | Payer: 59 | Source: Ambulatory Visit

## 2015-12-05 DIAGNOSIS — Z1231 Encounter for screening mammogram for malignant neoplasm of breast: Secondary | ICD-10-CM

## 2016-01-25 ENCOUNTER — Encounter (HOSPITAL_COMMUNITY): Payer: Self-pay | Admitting: Emergency Medicine

## 2016-01-25 ENCOUNTER — Emergency Department (HOSPITAL_COMMUNITY)
Admission: EM | Admit: 2016-01-25 | Discharge: 2016-01-25 | Disposition: A | Discharge status: Home / Self Care | Payer: 59 | Attending: Emergency Medicine | Admitting: Emergency Medicine

## 2016-01-25 DIAGNOSIS — M542 Cervicalgia: Secondary | ICD-10-CM | POA: Insufficient documentation

## 2016-01-25 DIAGNOSIS — Y92481 Parking lot as the place of occurrence of the external cause: Secondary | ICD-10-CM | POA: Diagnosis not present

## 2016-01-25 DIAGNOSIS — Y999 Unspecified external cause status: Secondary | ICD-10-CM | POA: Insufficient documentation

## 2016-01-25 DIAGNOSIS — Y939 Activity, unspecified: Secondary | ICD-10-CM | POA: Insufficient documentation

## 2016-01-25 DIAGNOSIS — M25571 Pain in right ankle and joints of right foot: Secondary | ICD-10-CM | POA: Diagnosis not present

## 2016-01-25 MED ORDER — METHOCARBAMOL 500 MG PO TABS: 500.0000 mg | ORAL_TABLET | Freq: Two times a day (BID) | ORAL | Status: DC

## 2016-01-25 MED ORDER — IBUPROFEN 400 MG PO TABS
600.0000 mg | ORAL_TABLET | Freq: Once | ORAL | Status: AC
  Administered 2016-01-25: 600 mg via ORAL
  Filled 2016-01-25: qty 1

## 2016-01-25 MED ORDER — IBUPROFEN 600 MG PO TABS: 600.0000 mg | ORAL_TABLET | Freq: Four times a day (QID) | ORAL | Status: DC | PRN
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
## 2016-01-25 NOTE — Discharge Instructions (Signed)
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
## 2016-02-05 ENCOUNTER — Ambulatory Visit (INDEPENDENT_AMBULATORY_CARE_PROVIDER_SITE_OTHER): Payer: 59

## 2016-02-05 ENCOUNTER — Ambulatory Visit (INDEPENDENT_AMBULATORY_CARE_PROVIDER_SITE_OTHER): Payer: 59 | Admitting: Family Medicine

## 2016-02-05 ENCOUNTER — Encounter: Payer: Self-pay | Admitting: Family Medicine

## 2016-02-05 VITALS — BP 138/86 | HR 73 | Temp 98.4°F | Resp 16 | Ht 64.0 in | Wt 217.4 lb

## 2016-02-05 DIAGNOSIS — M542 Cervicalgia: Secondary | ICD-10-CM | POA: Diagnosis not present

## 2016-02-05 DIAGNOSIS — M545 Low back pain, unspecified: Secondary | ICD-10-CM

## 2016-02-05 DIAGNOSIS — M5412 Radiculopathy, cervical region: Secondary | ICD-10-CM | POA: Diagnosis not present

## 2016-02-05 DIAGNOSIS — IMO0002 Reserved for concepts with insufficient information to code with codable children: Secondary | ICD-10-CM

## 2016-02-05 MED ORDER — CYCLOBENZAPRINE HCL 5 MG PO TABS
ORAL_TABLET | ORAL | 0 refills | Status: DC
Start: 2016-02-05 — End: 2016-04-14
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
## 2016-02-12 ENCOUNTER — Ambulatory Visit (INDEPENDENT_AMBULATORY_CARE_PROVIDER_SITE_OTHER): Payer: 59

## 2016-02-12 ENCOUNTER — Ambulatory Visit (INDEPENDENT_AMBULATORY_CARE_PROVIDER_SITE_OTHER): Payer: 59 | Admitting: Family Medicine

## 2016-02-12 VITALS — BP 118/80 | HR 85 | Temp 98.3°F | Resp 17 | Ht 64.0 in | Wt 217.0 lb

## 2016-02-12 DIAGNOSIS — IMO0002 Reserved for concepts with insufficient information to code with codable children: Secondary | ICD-10-CM

## 2016-02-12 DIAGNOSIS — S161XXD Strain of muscle, fascia and tendon at neck level, subsequent encounter: Secondary | ICD-10-CM

## 2016-02-12 DIAGNOSIS — R0789 Other chest pain: Secondary | ICD-10-CM

## 2016-02-12 DIAGNOSIS — M546 Pain in thoracic spine: Secondary | ICD-10-CM | POA: Diagnosis not present

## 2016-02-12 DIAGNOSIS — S39012D Strain of muscle, fascia and tendon of lower back, subsequent encounter: Secondary | ICD-10-CM
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
## 2016-02-12 NOTE — Patient Instructions (Addendum)
I will refer you to physical therapy for the neck and back pain. Okay to continue ibuprofen up to every 6 hours as needed with food. Use lowest effective dose. Muscle relaxant at bedtime if needed. Heat, ice, and range of motion as needed.  See information below on chest wall pain. If this increases, return to discuss further.  Return to the clinic or go to the nearest emergency room if any of your symptoms worsen or new symptoms occur.   Chest Wall Pain Chest wall pain is pain in or around the bones and muscles of your chest. Sometimes, an injury causes this pain. Sometimes, the cause may not be known. This pain may take several weeks or longer to get better. HOME CARE INSTRUCTIONS  Pay attention to any changes in your symptoms. Take these actions to help with your pain:   Rest as told by your health care provider.   Avoid activities that cause pain. These include any activities that use your chest muscles or your abdominal and side muscles to lift heavy items.   If directed, apply ice to the painful area:  Put ice in a plastic bag.  Place a towel between your skin and the bag.  Leave the ice on for 20 minutes, 2-3 times per day.  Take over-the-counter and prescription medicines only as told by your health care provider.  Do not use tobacco products, including cigarettes, chewing tobacco, and e-cigarettes. If you need help quitting, ask your health care provider.  Keep all follow-up visits as told by your health care provider. This is important. SEEK MEDICAL CARE IF:  You have a fever.  Your chest pain becomes worse.  You have new symptoms. SEEK IMMEDIATE MEDICAL CARE IF:  You have nausea or vomiting.  You feel sweaty or light-headed.  You have a cough with phlegm (sputum) or you cough up blood.  You develop shortness of breath.   This information is not intended to replace advice given to you by your health care provider. Make sure you discuss any questions you have  with your health care provider.   Document Released: 06/23/2005 Document Revised: 03/14/2015 Document Reviewed: 09/18/2014 Elsevier Interactive Patient Education 2016 [employer].   IF you received an x-ray today, you will receive an invoice from [facility]. Please contact [facility] at [phone] with questions or concerns regarding your invoice.   IF you received labwork today, you will receive an invoice from [employer]. Please contact [facility] at [phone] with questions or concerns regarding your invoice.   Our billing staff will not be able to assist you with questions regarding bills from these companies.  You will be contacted with the lab results as soon as they are available. The fastest way to get your results is to activate your My Chart account. Instructions are located on the last page of this paperwork. If you have not heard from [place] regarding the results in 2 weeks, please contact this office.

## 2016-02-19 ENCOUNTER — Ambulatory Visit: Payer: 59 | Attending: Family Medicine

## 2016-02-19 DIAGNOSIS — X58XXXA Exposure to other specified factors, initial encounter: Secondary | ICD-10-CM | POA: Diagnosis not present

## 2016-02-19 DIAGNOSIS — M545 Low back pain, unspecified: Secondary | ICD-10-CM

## 2016-02-19 DIAGNOSIS — S161XXA Strain of muscle, fascia and tendon at neck level, initial encounter: Secondary | ICD-10-CM | POA: Diagnosis present

## 2016-02-19 DIAGNOSIS — R252 Cramp and spasm: Secondary | ICD-10-CM | POA: Insufficient documentation

## 2016-02-19 DIAGNOSIS — M542 Cervicalgia: Secondary | ICD-10-CM | POA: Insufficient documentation

## 2016-02-19 DIAGNOSIS — R293 Abnormal posture: Secondary | ICD-10-CM | POA: Diagnosis present

## 2016-02-19 NOTE — Patient Instructions (Signed)
Issude from cabinet posture info , use of support , neck rotation and side bend and LTR and knee to chest  All 3x/day , 3 reps RT and LT 30 sec hold.
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
## 2016-02-25 ENCOUNTER — Ambulatory Visit: Payer: 59 | Admitting: Physical Therapy

## 2016-02-25 DIAGNOSIS — R293 Abnormal posture: Secondary | ICD-10-CM

## 2016-02-25 DIAGNOSIS — M545 Low back pain, unspecified: Secondary | ICD-10-CM

## 2016-02-25 DIAGNOSIS — M542 Cervicalgia: Secondary | ICD-10-CM | POA: Diagnosis not present

## 2016-02-25 DIAGNOSIS — R252 Cramp and spasm: Secondary | ICD-10-CM

## 2016-02-25 NOTE — Patient Instructions (Signed)
Scapular Retraction (Standing)    With arms at sides, pinch shoulder blades together. Repeat ___10-20_ times per set. Do ___1_ sets per session. Do __2__ sessions per day.  http://orth.exer.us/945   Copyright  VHI. All rights reserved.    Piriformis Stretch    Lying on back, pull right knee toward opposite shoulder. Hold __30__ seconds. OR try crossing R knee over L and rotating lower body to the L.  Switch sides.  Repeat __2-3__ times. Do _2___ sessions per day.  http://gt2.exer.us/258   Copyright  VHI. All rights reserved.

## 2016-02-25 NOTE — Therapy (Signed)
[place], [place], [address] Phone: [phone]   Fax:  [phone]  Physical Therapy Treatment  Patient Details  Name: Wendy [name] MRN: [id number] Date of Birth: 1955-12-14 Referring Provider: Corliss Parish, MD  Encounter Date: 02/25/2016      PT End of Session - 02/25/16 1347    Visit Number 2   Number of Visits 12   Date for PT Re-Evaluation 04/01/16   PT Start Time 1332   PT Stop Time 1433   PT Time Calculation (min) 61 min   Activity Tolerance Patient tolerated treatment well   Behavior During Therapy [facility] for tasks assessed/performed      Past Medical History:  Diagnosis Date  . ESOPHAGITIS, REFLUX   . Glaucoma   . HELICOBACTER PYLORI INFECTION   . HEMORRHOIDS, INTERNAL     Past Surgical History:  Procedure Laterality Date  . CESAREAN SECTION  1989  . TONSILLECTOMY     age 60 years old  . WRIST FRACTURE SURGERY  2006    There were no vitals filed for this visit.      Subjective Assessment - 02/25/16 1334    Subjective Does not take meds consistently.  HAs been doing her HEP. Pain in low back with grocery shopping > 1 hour.    How long can you sit comfortably? not a problem up to an hour.    How long can you stand comfortably? 60 min    How long can you walk comfortably? 45-60 min    Currently in Pain? Yes   Pain Score 4    Pain Location Neck   Pain Orientation Posterior;Right;Left  R>L    Pain Descriptors / Indicators Aching   Pain Type Acute pain;Chronic pain   Pain Onset More than a month ago   Pain Frequency Intermittent   Pain Score 3   Pain Location Back   Pain Orientation Right;Lower   Pain Type Acute pain;Chronic pain   Pain Onset More than a month ago   Aggravating Factors  standing reaching, twisting    Pain Relieving Factors heat, meds                          [facility] Adult PT Treatment/Exercise - 02/25/16 1350      Lumbar Exercises:  Stretches   Active Hamstring Stretch 3 reps;30 seconds   Single Knee to Chest Stretch 3 reps;30 seconds   Lower Trunk Rotation 10 seconds   Lower Trunk Rotation Limitations x 10    Piriformis Stretch 2 reps;30 seconds   Piriformis Stretch Limitations R , knee over knee and rotate      Shoulder Exercises: Seated   Retraction Strengthening;Both;10 reps     Moist Heat Therapy   Number Minutes Moist Heat 15 Minutes   Moist Heat Location Cervical;Lumbar Spine     Electrical Stimulation   Electrical Stimulation Location lumbar    Electrical Stimulation Action IFC   Electrical Stimulation Parameters L 7   Electrical Stimulation Goals Pain     Manual Therapy   Manual Therapy Soft tissue mobilization;Myofascial release  in massage chair   Soft tissue mobilization upper traps, levator scap and into Rt. rhomboids, light work on suboccipitals    Myofascial Release neck and upper back      Neck Exercises: Stretches   Upper Trapezius Stretch 2 reps;30 seconds   Other Neck Stretches rotation, AROM C spine  PT Education - 02/25/16 1526    Education provided Yes   Education Details massage, dry needling, ther ex   Person(s) Educated Patient   Methods Explanation;Verbal cues   Comprehension Verbalized understanding;Returned demonstration          PT Short Term Goals - 02/25/16 1529      PT SHORT TERM GOAL #1   Title She will be independent with inital HEP   Status Achieved     PT SHORT TERM GOAL #2   Title She will report pain decr 30% or more with reaching and twisting   Status Achieved           PT Long Term Goals - 02/25/16 1530      PT LONG TERM GOAL #1   Title she will be independent with all HEP issued   Status On-going     PT LONG TERM GOAL #2   Title She will report pain as 1-2 max with all ALD's and with bending and twisting with home tasks and daily activity   Status On-going               Plan - 02/25/16 1527    Clinical  Impression Statement Patient I with HEP for light stretching, added on today.  She felt relief of neck pain with manual, felt more flexible post soft tissue work.  Mod sized trigger point in Rt. levator scap mm.  She would benefit from dry needling but would like to postpone to see how she does without it.    PT Next Visit Plan Manual with modalities and stretching   PT Home Exercise Plan C AROM, scap retract, LTR, piriformis stretch    Consulted and Agree with Plan of Care Patient      Patient will benefit from skilled therapeutic intervention in order to improve the following deficits and impairments:  Pain, Postural dysfunction, Decreased range of motion, Increased muscle spasms  Visit Diagnosis: Cervicalgia  Midline low back pain without sciatica  Cramp and spasm  Abnormal posture     Problem List Patient Active Problem List   Diagnosis Date Noted  . Chronic glaucoma 08/11/2013  . HELICOBACTER PYLORI INFECTION 08/27/2007  . GERD 08/27/2007  . ESOPHAGITIS, REFLUX 05/17/2007  . HEMORRHOIDS, INTERNAL 09/22/2003    Jaxsun Ciampi 02/25/2016, 3:32 PM  [facility] [address] [place], [place], [address] Phone: [phone]   Fax:  [phone]  Name: Wendy [name] MRN: [id number] Date of Birth: 12-29-1955   Raeford Razor, PT 02/25/16 3:32 PM Phone: [phone] Fax: [phone]

## 2016-02-26 ENCOUNTER — Encounter: Payer: 59 | Admitting: Physical Therapy

## 2016-02-28 ENCOUNTER — Encounter: Payer: 59 | Admitting: Physical Therapy

## 2016-03-04 ENCOUNTER — Ambulatory Visit: Payer: 59

## 2016-03-04 DIAGNOSIS — M545 Low back pain, unspecified: Secondary | ICD-10-CM

## 2016-03-04 DIAGNOSIS — M542 Cervicalgia: Secondary | ICD-10-CM | POA: Diagnosis not present

## 2016-03-04 DIAGNOSIS — R293 Abnormal posture: Secondary | ICD-10-CM

## 2016-03-04 DIAGNOSIS — S161XXA Strain of muscle, fascia and tendon at neck level, initial encounter: Secondary | ICD-10-CM

## 2016-03-04 DIAGNOSIS — R252 Cramp and spasm: Secondary | ICD-10-CM

## 2016-03-04 NOTE — Therapy (Signed)
[place], [place], [address] Phone: [phone]   Fax:  [phone]  Physical Therapy Treatment  Patient Details  Name: Wendy [name] MRN: [id number] Date of Birth: 1956-05-09 Referring Provider: Corliss Parish, MD  Encounter Date: 03/04/2016      PT End of Session - 03/04/16 1619    Visit Number 3   Number of Visits 12   Date for PT Re-Evaluation 04/01/16   Authorization Type UHC   PT Start Time 0418   PT Stop Time 0515   PT Time Calculation (min) 57 min   Activity Tolerance Patient tolerated treatment well   Behavior During Therapy [facility] for tasks assessed/performed      Past Medical History:  Diagnosis Date  . ESOPHAGITIS, REFLUX   . Glaucoma   . HELICOBACTER PYLORI INFECTION   . HEMORRHOIDS, INTERNAL     Past Surgical History:  Procedure Laterality Date  . CESAREAN SECTION  1989  . TONSILLECTOMY     age 68 years old  . WRIST FRACTURE SURGERY  2006    There were no vitals filed for this visit.      Subjective Assessment - 03/04/16 1619    Subjective She reports pain improved 15-20%  in neck and back but varies.     Currently in Pain? Yes   Pain Score 3    Pain Location Neck   Pain Orientation Right;Left;Posterior   Pain Descriptors / Indicators Aching   Pain Type Chronic pain   Pain Onset More than a month ago   Pain Frequency Intermittent   Aggravating Factors  extended reach , sit, twist   Pain Relieving Factors heat , meds   Multiple Pain Sites Yes   Pain Score 4   Pain Location Back   Pain Orientation Right;Lower   Pain Descriptors / Indicators Dull;Aching   Pain Type Chronic pain   Pain Onset More than a month ago   Pain Frequency Intermittent  last week   Aggravating Factors  stand reach twist   Pain Relieving Factors heat meds                         [facility] Adult PT Treatment/Exercise - 03/04/16 0001      Neck Exercises: Theraband   Shoulder  External Rotation 10 reps;Limitations;Other (comment)  yellow band   Horizontal ABduction 10 reps   Horizontal ABduction Limitations yellow band     Neck Exercises: Seated   Cervical Rotation Limitations 3 reps RT and Lt  gently   Lateral Flexion Limitations 3 reps gentle RT and LT      Lumbar Exercises: Stretches   Active Hamstring Stretch 2 reps   Single Knee to Chest Stretch 2 reps;30 seconds  RT and LT   Lower Trunk Rotation 10 seconds   Lower Trunk Rotation Limitations x3  to LT    Pelvic Tilt 5 reps   Piriformis Stretch 2 reps;30 seconds   Piriformis Stretch Limitations R , knee over knee and rotate      Moist Heat Therapy   Number Minutes Moist Heat 15 Minutes   Moist Heat Location Cervical;Lumbar Spine     Manual Therapy   Soft tissue mobilization With rock tool to upper traps, levator scap and into Rt. rhomboids,  work on suboccipitals  and RT quadratus and paraspinals    Manual Traction cervical traction 30 sec x 5  PT Short Term Goals - 02/25/16 1529      PT SHORT TERM GOAL #1   Title She will be independent with inital HEP   Status Achieved     PT SHORT TERM GOAL #2   Title She will report pain decr 30% or more with reaching and twisting   Status Achieved           PT Long Term Goals - 02/25/16 1530      PT LONG TERM GOAL #1   Title she will be independent with all HEP issued   Status On-going     PT LONG TERM GOAL #2   Title She will report pain as 1-2 max with all ALD's and with bending and twisting with home tasks and daily activity   Status On-going               Plan - 03/04/16 1718    Clinical Impression Statement She reports improvement in pain and appears to be doing well with exercise and understands how to correct posture.    PT Treatment/Interventions Cryotherapy;Electrical Stimulation;Iontophoresis 4mg /ml Dexamethasone;Moist Heat;Traction;Ultrasound;Passive range of motion;Patient/family  education;Manual techniques;Taping;Therapeutic exercise;Therapeutic activities;Dry needling   PT Next Visit Plan Manual with modalities and stretching, add electric stim if wanted. check ROM neck   Consulted and Agree with Plan of Care Patient      Patient will benefit from skilled therapeutic intervention in order to improve the following deficits and impairments:  Pain, Postural dysfunction, Decreased range of motion, Increased muscle spasms  Visit Diagnosis: Cervicalgia  Midline low back pain without sciatica  Cramp and spasm  Abnormal posture  Neck strain, initial encounter     Problem List Patient Active Problem List   Diagnosis Date Noted  . Chronic glaucoma 08/11/2013  . HELICOBACTER PYLORI INFECTION 08/27/2007  . GERD 08/27/2007  . ESOPHAGITIS, REFLUX 05/17/2007  . HEMORRHOIDS, INTERNAL 09/22/2003    Darrel Hoover PT 03/04/2016, 5:23 PM  [facility] [address] [place], [place], [address] Phone: [phone]   Fax:  [phone]  Name: Wendy [name] MRN: [id number] Date of Birth: [birth year]

## 2016-03-06 ENCOUNTER — Ambulatory Visit: Payer: 59 | Admitting: Physical Therapy

## 2016-03-06 DIAGNOSIS — R252 Cramp and spasm: Secondary | ICD-10-CM

## 2016-03-06 DIAGNOSIS — M542 Cervicalgia: Secondary | ICD-10-CM

## 2016-03-06 DIAGNOSIS — M545 Low back pain, unspecified: Secondary | ICD-10-CM

## 2016-03-06 DIAGNOSIS — S161XXA Strain of muscle, fascia and tendon at neck level, initial encounter: Secondary | ICD-10-CM

## 2016-03-06 DIAGNOSIS — R293 Abnormal posture: Secondary | ICD-10-CM

## 2016-03-06 NOTE — Patient Instructions (Signed)
Scoot hips to side prior to stand to decrease spasm Quadratus. Handout issued from [facility] drawer.

## 2016-03-06 NOTE — Therapy (Signed)
[place], [place], [address] Phone: [phone]   Fax:  [phone]  Physical Therapy Treatment  Patient Details  Name: Wendy [name] MRN: [id number] Date of Birth: 11/19/1955 Referring Provider: Corliss Parish, MD  Encounter Date: 03/06/2016      PT End of Session - 03/06/16 1755    Visit Number 4   Number of Visits 12   Date for PT Re-Evaluation 04/01/16   PT Start Time 1634   PT Stop Time 1735   PT Time Calculation (min) 61 min   Activity Tolerance Patient tolerated treatment well   Behavior During Therapy [facility] for tasks assessed/performed      Past Medical History:  Diagnosis Date  . ESOPHAGITIS, REFLUX   . Glaucoma   . HELICOBACTER PYLORI INFECTION   . HEMORRHOIDS, INTERNAL     Past Surgical History:  Procedure Laterality Date  . CESAREAN SECTION  1989  . TONSILLECTOMY     age 60 years old  . WRIST FRACTURE SURGERY  2006    There were no vitals filed for this visit.      Subjective Assessment - 03/06/16 1638    Subjective Neck and along paraspinals.  Paraspinal pain new, started 1 hour ago.     Currently in Pain? Yes   Pain Score 4    Pain Location Abdomen   Pain Orientation Mid   Pain Descriptors / Indicators Aching   Pain Frequency Intermittent   Aggravating Factors  sitting   Pain Relieving Factors heat ,  lumbar support,  stretching.(Pop)   Pain Score 2   Pain Location Back   Pain Orientation Lower   Pain Descriptors / Indicators Aching;Dull   Aggravating Factors  all day sitting   Pain Relieving Factors stretch 1.5 hours.  getting up and around.    Effect of Pain on Daily Activities limits ADL to 1 hour at a time.              [facility] PT Assessment - 03/06/16 0001      AROM   Cervical Flexion 35  2 fingers width from chest   Cervical Extension 45   Cervical - Right Side Bend 45   Cervical - Left Side Bend 32  2/10 end range   Cervical - Right Rotation 64    Cervical - Left Rotation 62   Lumbar Flexion --  3 inch reach from floor   Lumbar Extension pain end range ,  limited 45 %                     [facility] Adult PT Treatment/Exercise - 03/06/16 0001      Lumbar Exercises: Stretches   Lower Trunk Rotation Limitations Bookopener 3 X stopped due to increased Pain quadratus lumborum   Standing Side Bend Limitations advised to stretch away from right only to open ribs.on right.    discussed sit to stand techniques with scoot  to stand to      Moist Heat Therapy   Number Minutes Moist Heat 15 Minutes   Moist Heat Location Cervical;Lumbar Spine     Electrical Stimulation   Electrical Stimulation Location cervical/lumbar   Electrical Stimulation Action IFC   Electrical Stimulation Parameters 6   Electrical Stimulation Goals Pain     Manual Therapy   Manual therapy comments light pressure tolerated,  any attempt of deeper is met with resistance and grimaces.  Patient does not tell you when pressure is too  much.  Skin clear today,  Instrument assist used initially, but stopped after about 3 minutes and manual continued with myofascial release from neck to hip/glutes.  > less pain and tightness reported post session.                  PT Education - 03/06/16 1755    Education provided Yes   Education Details Scooting   Person(s) Educated Patient   Methods Explanation;Demonstration;Handout   Comprehension Verbalized understanding          PT Short Term Goals - 03/06/16 1805      PT SHORT TERM GOAL #1   Title She will be independent with inital HEP   Time 3   Period Weeks   Status Achieved     PT SHORT TERM GOAL #2   Title She will report pain decr 30% or more with reaching and twisting   Time 3   Period Weeks   Status Achieved           PT Long Term Goals - 03/06/16 1805      PT LONG TERM GOAL #1   Title she will be independent with all HEP issued   Time 6   Period Weeks   Status On-going     PT  LONG TERM GOAL #2   Title She will report pain as 1-2 max with all ALD's and with bending and twisting with home tasks and daily activity   Time 6   Period Weeks   Status On-going               Plan - 03/06/16 1757    Clinical Impression Statement Patient noted she had red skin on neck yesterday from Instrument assist.  Skin was clear non red today.  Light pressure tolerated with myofascial releases from neck to hip RT.  She has a system where she gets up frequently from desk to avoid extra pain.  See ROM for gains. No new goals met   PT Next Visit Plan Manual with modalities and stretching, add electric stim if wanted.    PT Home Exercise Plan continue,  Stretch TRUNK rt only  , to the left   Consulted and Agree with Plan of Care Patient      Patient will benefit from skilled therapeutic intervention in order to improve the following deficits and impairments:     Visit Diagnosis: Cervicalgia  Midline low back pain without sciatica  Cramp and spasm  Abnormal posture  Neck strain, initial encounter     Problem List Patient Active Problem List   Diagnosis Date Noted  . Chronic glaucoma 08/11/2013  . HELICOBACTER PYLORI INFECTION 08/27/2007  . GERD 08/27/2007  . ESOPHAGITIS, REFLUX 05/17/2007  . HEMORRHOIDS, INTERNAL 09/22/2003    [facility] 03/06/2016, 6:09 PM  [facility] [address] [place], [place], [address] Phone: [phone]   Fax:  [phone]  Name: Wendy [name] MRN: [id number] Date of Birth: 04-23-1956   Melvenia Needles, PTA 03/06/16 6:09 PM Phone: [phone] Fax: [phone]

## 2016-03-11 ENCOUNTER — Ambulatory Visit: Payer: 59 | Attending: Family Medicine

## 2016-03-11 ENCOUNTER — Ambulatory Visit (INDEPENDENT_AMBULATORY_CARE_PROVIDER_SITE_OTHER): Payer: 59 | Admitting: Family Medicine

## 2016-03-11 VITALS — BP 128/82 | HR 71 | Temp 98.1°F | Resp 17 | Ht 64.0 in | Wt 214.0 lb

## 2016-03-11 DIAGNOSIS — R293 Abnormal posture: Secondary | ICD-10-CM | POA: Insufficient documentation

## 2016-03-11 DIAGNOSIS — M545 Low back pain, unspecified: Secondary | ICD-10-CM

## 2016-03-11 DIAGNOSIS — S161XXA Strain of muscle, fascia and tendon at neck level, initial encounter: Secondary | ICD-10-CM | POA: Insufficient documentation

## 2016-03-11 DIAGNOSIS — M542 Cervicalgia: Secondary | ICD-10-CM | POA: Diagnosis not present

## 2016-03-11 NOTE — Patient Instructions (Addendum)
Continue physical therapy. Motrin as needed, and Flexeril at bedtime if needed. Follow-up with me in one month, sooner if any worsening of your symptoms. Let me know if you have any questions in the meantime.    IF you received an x-ray today, you will receive an invoice from [facility]. Please contact [facility] at [phone] with questions or concerns regarding your invoice.   IF you received labwork today, you will receive an invoice from [employer]. Please contact [facility] at [phone] with questions or concerns regarding your invoice.   Our billing staff will not be able to assist you with questions regarding bills from these companies.  You will be contacted with the lab results as soon as they are available. The fastest way to get your results is to activate your My Chart account. Instructions are located on the last page of this paperwork. If you have not heard from [place] regarding the results in 2 weeks, please contact this office.
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
## 2016-03-13 ENCOUNTER — Ambulatory Visit: Payer: 59

## 2016-03-18 ENCOUNTER — Ambulatory Visit: Payer: 59 | Admitting: Physical Therapy

## 2016-03-18 DIAGNOSIS — M542 Cervicalgia: Secondary | ICD-10-CM | POA: Diagnosis present

## 2016-03-18 DIAGNOSIS — R293 Abnormal posture: Secondary | ICD-10-CM

## 2016-03-18 DIAGNOSIS — S161XXA Strain of muscle, fascia and tendon at neck level, initial encounter: Secondary | ICD-10-CM | POA: Diagnosis present

## 2016-03-18 DIAGNOSIS — M545 Low back pain: Secondary | ICD-10-CM | POA: Diagnosis present

## 2016-03-18 NOTE — Therapy (Signed)
[place], [place], [address] Phone: [phone]   Fax:  [phone]  Physical Therapy Treatment  Patient Details  Name: Wendy [name] MRN: [id number] Date of Birth: 11/01/1955 Referring Provider: Corliss Parish, MD  Encounter Date: 03/18/2016      PT End of Session - 03/18/16 1739    Visit Number 5   Number of Visits 12   Date for PT Re-Evaluation 04/01/16   PT Start Time [id number]   PT Stop Time 1743   PT Time Calculation (min) 67 min   Activity Tolerance Patient tolerated treatment well   Behavior During Therapy [facility] for tasks assessed/performed      Past Medical History:  Diagnosis Date  . ESOPHAGITIS, REFLUX   . Glaucoma   . HELICOBACTER PYLORI INFECTION   . HEMORRHOIDS, INTERNAL     Past Surgical History:  Procedure Laterality Date  . CESAREAN SECTION  1989  . TONSILLECTOMY     age 19 years old  . WRIST FRACTURE SURGERY  2006    There were no vitals filed for this visit.      Subjective Assessment - 03/18/16 1637    Subjective neck and back were 2/10 until yesterday.  Wether insed to 4/10.   Currently in Pain? Yes   Pain Score 0-No pain   Multiple Pain Sites Yes   Pain Score 4   Pain Location Back   Pain Orientation Lower;Right   Pain Descriptors / Indicators Aching;Dull   Pain Frequency Intermittent   Aggravating Factors  weather, sitting   Pain Relieving Factors moving   Pain Score 4   Pain Location Neck   Pain Orientation Posterior;Mid   Pain Descriptors / Indicators Tender   Pain Radiating Towards right upper trap   Pain Onset In the past 7 days   Aggravating Factors  moving head certain ways.    Pain Relieving Factors not moving,  posture improvements,  avoiding bending too far                         [facility] Adult PT Treatment/Exercise - 03/18/16 0001      Moist Heat Therapy   Number Minutes Moist Heat 15 Minutes   Moist Heat Location  Cervical;Lumbar Spine  sidelying.     Acupuncturist Location cervical rt, lumbar lower   Electrical Stimulation Action IFC   Electrical Stimulation Parameters 8   Electrical Stimulation Goals Pain     Manual Therapy   Manual Therapy Soft tissue mobilization   Manual therapy comments neck mid to lower back rt, and right gluteal soft tissue work,  light pressure to slightly deeper press ure tolerated.  Followed by strumming neck musculature and pec stretch to decrease upper trap pain,  gluteal piriformis strumming in sidelying during stretch.  tissue softened,                  PT Education - 03/18/16 1735    Education provided Yes   Education Details toilet posture   Person(s) Educated Patient   Methods Explanation   Comprehension Verbalized understanding          PT Short Term Goals - 03/18/16 1754      PT SHORT TERM GOAL #1   Title She will be independent with inital HEP   Time 3   Period Weeks   Status Achieved     PT SHORT TERM GOAL #2  Title She will report pain decr 30% or more with reaching and twisting   Baseline pain with this is less % not given   Time 3   Period Weeks   Status On-going           PT Long Term Goals - 03/06/16 1805      PT LONG TERM GOAL #1   Title she will be independent with all HEP issued   Time 6   Period Weeks   Status On-going     PT LONG TERM GOAL #2   Title She will report pain as 1-2 max with all ALD's and with bending and twisting with home tasks and daily activity   Time 6   Period Weeks   Status On-going               Plan - 03/18/16 1740    Clinical Impression Statement Pain had been improving until weather change yesterday.  Pain 2/10 upper trap post ,manual and then decreased after stretch to Pec right.  Tissue softened.  She noted increased effort to defficate (not due to pain)  Posture ed continued. Abdominal pain resolved.    PT Next Visit Plan manual as needed,   check specific goals.    PT Home Exercise Plan continue no new exercises added,  feet on step stool on toilet   Consulted and Agree with Plan of Care Patient      Patient will benefit from skilled therapeutic intervention in order to improve the following deficits and impairments:  Pain, Postural dysfunction, Decreased range of motion, Increased muscle spasms  Visit Diagnosis: Abnormal posture  Neck strain, initial encounter     Problem List Patient Active Problem List   Diagnosis Date Noted  . Chronic glaucoma 08/11/2013  . HELICOBACTER PYLORI INFECTION 08/27/2007  . GERD 08/27/2007  . ESOPHAGITIS, REFLUX 05/17/2007  . HEMORRHOIDS, INTERNAL 09/22/2003    [facility] 03/18/2016, 5:56 PM  [facility] [address] [place], [place], [address] Phone: [phone]   Fax:  [phone]  Name: Wendy [name] MRN: [id number] Date of Birth: 1955/12/16   Melvenia Needles, PTA 03/18/16 5:56 PM Phone: [phone] Fax: [phone]

## 2016-03-20 ENCOUNTER — Ambulatory Visit: Payer: 59

## 2016-04-03 ENCOUNTER — Ambulatory Visit: Payer: 59 | Admitting: Physical Therapy

## 2016-04-03 DIAGNOSIS — M542 Cervicalgia: Secondary | ICD-10-CM

## 2016-04-03 DIAGNOSIS — M545 Low back pain, unspecified: Secondary | ICD-10-CM

## 2016-04-03 DIAGNOSIS — R293 Abnormal posture: Secondary | ICD-10-CM | POA: Diagnosis not present

## 2016-04-03 DIAGNOSIS — S161XXA Strain of muscle, fascia and tendon at neck level, initial encounter: Secondary | ICD-10-CM

## 2016-04-04 NOTE — Therapy (Signed)
[place], [place], [address] Phone: [phone]   Fax:  [phone]  Physical Therapy Treatment  Patient Details  Name: Wendy [name] MRN: [id number] Date of Birth: 02-Sep-1955 Referring Provider: Corliss Parish, MD  Encounter Date: 04/03/2016      PT End of Session - 04/04/16 1236    Visit Number 6   Number of Visits 12   Date for PT Re-Evaluation 05/16/16   Authorization Type [employer]   PT Start Time 0430   PT Stop Time 0525   PT Time Calculation (min) 55 min   Activity Tolerance Patient tolerated treatment well   Behavior During Therapy [facility] for tasks assessed/performed      Past Medical History:  Diagnosis Date  . ESOPHAGITIS, REFLUX   . Glaucoma   . HELICOBACTER PYLORI INFECTION   . HEMORRHOIDS, INTERNAL     Past Surgical History:  Procedure Laterality Date  . CESAREAN SECTION  1989  . TONSILLECTOMY     age 60 years old  . WRIST FRACTURE SURGERY  2006    There were no vitals filed for this visit.      Subjective Assessment - 04/03/16 1637    Subjective Patient's neck pain remains about a 3/10 and her lower back pain is about a 3/10. She feesl the exercises help. she has not been to therapy for a few weeks. She continue to have pain with prolonged poistioning    How long can you sit comfortably? not a problem up to an hour.    How long can you stand comfortably? 60 min    How long can you walk comfortably? 45-60 min    Diagnostic tests xrays: negative    Currently in Pain? Yes   Pain Score 3    Pain Orientation Mid   Pain Descriptors / Indicators Aching   Pain Type Chronic pain   Pain Onset More than a month ago   Pain Frequency Intermittent   Aggravating Factors  sitting    Pain Relieving Factors heat, lumbar support, stretching    Multiple Pain Sites No   Pain Score 3   Pain Location Back   Pain Orientation Right;Lower   Pain Descriptors / Indicators Aching;Dull   Pain Type  Chronic pain   Pain Onset More than a month ago   Pain Frequency Intermittent   Aggravating Factors  weather and sitting    Pain Relieving Factors moving    Effect of Pain on Daily Activities limitsADL time                          [facility] Adult PT Treatment/Exercise - 04/04/16 0001      Lumbar Exercises: Stretches   Single Knee to Chest Stretch 2 reps;30 seconds  RT and LT   Lower Trunk Rotation 10 seconds   Piriformis Stretch 2 reps;30 seconds   Piriformis Stretch Limitations R , knee over knee and rotate      Moist Heat Therapy   Moist Heat Location Cervical;Lumbar Spine  sidelying.     Acupuncturist Location cervical rt, lumbar lower   Electrical Stimulation Goals Pain     Manual Therapy   Manual Therapy Soft tissue mobilization   Manual therapy comments Lumbar soft tissue release; to lumbar spine with focus on the right side.                 PT Education - 04/04/16  1235    Education provided Yes   Education Details continue with HEP    Person(s) Educated Patient   Methods Explanation   Comprehension Verbalized understanding          PT Short Term Goals - 04/03/16 1641      PT SHORT TERM GOAL #1   Title She will be independent with inital HEP   Time 3   Period Weeks   Status Achieved     PT SHORT TERM GOAL #2   Title She will report pain decr 30% or more with reaching and twisting   Baseline No pain with reaching    Time 3   Period Weeks   Status Achieved           PT Long Term Goals - 04/03/16 1642      PT LONG TERM GOAL #1   Title she will be independent with all HEP issued   Time 6   Period Weeks   Status On-going     PT LONG TERM GOAL #2   Baseline continues to have pain when reaching and stretching the lower back   Time 6   Period Weeks   Status On-going               Plan - 04/04/16 1237    Clinical Impression Statement Patient is improving but she is still having  pain. She would benefit from furter therapy for 2w6 to work towards improving cervical and lumbar pain. she has improved cervical ROM, shoulder strength and decreased spasming out the upper traps. She has spasing still on the right side of her lumbar spine.    Rehab Potential Good   PT Frequency 2x / week   PT Duration 8 weeks   PT Treatment/Interventions Cryotherapy;Electrical Stimulation;Iontophoresis 4mg /ml Dexamethasone;Moist Heat;Traction;Ultrasound;Passive range of motion;Patient/family education;Manual techniques;Taping;Therapeutic exercise;Therapeutic activities;Dry needling   PT Next Visit Plan manual as needed,  check specific goals.    PT Home Exercise Plan continue no new exercises added,  feet on step stool on toilet   Consulted and Agree with Plan of Care Patient      Patient will benefit from skilled therapeutic intervention in order to improve the following deficits and impairments:  Pain, Postural dysfunction, Decreased range of motion, Increased muscle spasms  Visit Diagnosis: Abnormal posture - Plan: PT PLAN OF CARE CERT/RE-CERT  Neck strain, initial encounter - Plan: PT PLAN OF CARE CERT/RE-CERT  Cervicalgia - Plan: PT PLAN OF CARE CERT/RE-CERT  Midline low back pain without sciatica - Plan: PT PLAN OF CARE CERT/RE-CERT     Problem List Patient Active Problem List   Diagnosis Date Noted  . Chronic glaucoma 08/11/2013  . HELICOBACTER PYLORI INFECTION 08/27/2007  . GERD 08/27/2007  . ESOPHAGITIS, REFLUX 05/17/2007  . HEMORRHOIDS, INTERNAL 09/22/2003    Carney Living PT DPT  04/04/2016, 12:44 PM  [facility] [address] [place], [place], [address] Phone: [phone]   Fax:  [phone]  Name: Wendy [name] MRN: [id number] Date of Birth: 02/14/1956

## 2016-04-10 ENCOUNTER — Ambulatory Visit: Payer: 59 | Attending: Family Medicine | Admitting: Physical Therapy

## 2016-04-10 DIAGNOSIS — S161XXA Strain of muscle, fascia and tendon at neck level, initial encounter: Secondary | ICD-10-CM | POA: Insufficient documentation

## 2016-04-10 DIAGNOSIS — M542 Cervicalgia: Secondary | ICD-10-CM | POA: Diagnosis present

## 2016-04-10 DIAGNOSIS — R252 Cramp and spasm: Secondary | ICD-10-CM

## 2016-04-10 DIAGNOSIS — M545 Low back pain, unspecified: Secondary | ICD-10-CM

## 2016-04-10 DIAGNOSIS — R293 Abnormal posture: Secondary | ICD-10-CM

## 2016-04-10 NOTE — Therapy (Signed)
[place], [place], [address] Phone: [phone]   Fax:  [phone]  Physical Therapy Treatment  Patient Details  Name: Wendy [name] MRN: [id number] Date of Birth: 18-Aug-1955 Referring Provider: Corliss Parish, MD  Encounter Date: 04/10/2016      PT End of Session - 04/10/16 1733    Visit Number 7   Number of Visits 12   Date for PT Re-Evaluation 05/16/16   PT Start Time 2703   PT Stop Time 1730   PT Time Calculation (min) 48 min   Activity Tolerance Patient tolerated treatment well   Behavior During Therapy [facility] for tasks assessed/performed      Past Medical History:  Diagnosis Date  . ESOPHAGITIS, REFLUX   . Glaucoma   . HELICOBACTER PYLORI INFECTION   . HEMORRHOIDS, INTERNAL     Past Surgical History:  Procedure Laterality Date  . CESAREAN SECTION  1989  . TONSILLECTOMY     age 51 years old  . WRIST FRACTURE SURGERY  2006    There were no vitals filed for this visit.      Subjective Assessment - 04/10/16 1648    Subjective 3-4/10,  Neck   Currently in Pain? Yes   Pain Score 3                          [facility] Adult PT Treatment/Exercise - 04/10/16 0001      Lumbar Exercises: Stretches   Passive Hamstring Stretch 20 seconds;30 seconds   Passive Hamstring Stretch Limitations RT/LT   Single Knee to Chest Stretch 2 reps;20 seconds  AA, ROM much improved   Lower Trunk Rotation 5 reps;10 seconds     Lumbar Exercises: Supine   Ab Set 5 reps   Clam 5 reps   Clam Limitations cues intermittantly for abdominal control   Heel Slides 5 reps   Heel Slides Limitations shorter motions, cues   Bent Knee Raise 5 reps   Bent Knee Raise Limitations cues initially, HEP   Bridge 10 reps   Bridge Limitations HEP small lifts.                PT Education - 04/10/16 1733    Education provided Yes   Education Details exercises   Person(s) Educated Patient    Methods Explanation;Demonstration;Tactile cues;Verbal cues;Handout   Comprehension Verbalized understanding;Returned demonstration          PT Short Term Goals - 04/03/16 1641      PT SHORT TERM GOAL #1   Title She will be independent with inital HEP   Time 3   Period Weeks   Status Achieved     PT SHORT TERM GOAL #2   Title She will report pain decr 30% or more with reaching and twisting   Baseline No pain with reaching    Time 3   Period Weeks   Status Achieved           PT Long Term Goals - 04/10/16 1743      PT LONG TERM GOAL #1   Title she will be independent with all HEP issued   Baseline Able to progress today   Time 6   Period Weeks   Status On-going     PT LONG TERM GOAL #2   Title She will report pain as 1-2 max with all ALD's and with bending and twisting with home tasks and daily activity   Baseline  3-4/10 lower back.  No pain with reaching   Time 6   Period Weeks   Status Partially Met               Plan - 04/10/16 1734    Clinical Impression Statement Pain now mostly at the end of the day and with sitting too long.  Able to progress home exercises for stabilization. Patient moved with less guarding.  Left hamstrings 90 PROM,  RT 85 PROM.  Hip flexion improved at least 8-10 degrees bilaterally.  Pain has increased " A little" in low back at the end of the session.  (From standing bAND work)  She feels she has all her cervical ROM now. LTG#2 partially met.   PT Next Visit Plan review home ex,  Measure ROM   PT Home Exercise Plan bridge, Lumbar stabilization 1, rowsred/blue band   Consulted and Agree with Plan of Care Patient      Patient will benefit from skilled therapeutic intervention in order to improve the following deficits and impairments:  Pain, Postural dysfunction, Decreased range of motion, Increased muscle spasms  Visit Diagnosis: Abnormal posture  Cervicalgia  Midline low back pain without sciatica, unspecified  chronicity  Neck strain, initial encounter  Cramp and spasm     Problem List Patient Active Problem List   Diagnosis Date Noted  . Chronic glaucoma 08/11/2013  . HELICOBACTER PYLORI INFECTION 08/27/2007  . GERD 08/27/2007  . ESOPHAGITIS, REFLUX 05/17/2007  . HEMORRHOIDS, INTERNAL 09/22/2003    Anapaola Kinsel PTA 04/10/2016, 5:46 PM  [facility] [address] [place], [place], [address] Phone: [phone]   Fax:  [phone]  Name: AAMIRA BISCHOFF MRN: [id number] Date of Birth: May 07, 1956

## 2016-04-10 NOTE — Patient Instructions (Signed)
From Exercise drawer: Bridge 10 X  Lumbar stabilization 1  Row bent- blue band, straight - red band   Daily to 3-4 X a week

## 2016-04-14 ENCOUNTER — Ambulatory Visit (INDEPENDENT_AMBULATORY_CARE_PROVIDER_SITE_OTHER): Payer: 59 | Admitting: Family Medicine

## 2016-04-14 VITALS — BP 122/74 | HR 80 | Temp 98.1°F | Resp 16 | Ht 63.5 in | Wt 216.0 lb

## 2016-04-14 DIAGNOSIS — M545 Low back pain, unspecified: Secondary | ICD-10-CM

## 2016-04-14 DIAGNOSIS — R202 Paresthesia of skin: Secondary | ICD-10-CM

## 2016-04-14 DIAGNOSIS — M542 Cervicalgia: Secondary | ICD-10-CM

## 2016-04-14 DIAGNOSIS — R200 Anesthesia of skin: Secondary | ICD-10-CM

## 2016-04-14 MED ORDER — CYCLOBENZAPRINE HCL 5 MG PO TABS
ORAL_TABLET | ORAL | 0 refills | Status: DC
Start: 2016-04-14 — End: 2018-04-30
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
## 2016-04-14 NOTE — Patient Instructions (Addendum)
  Okay to continue ibuprofen as needed, Flexeril as needed at bedtime. Continue physical therapy for now but I will refer you to orthopedics for further evaluation of the continued neck and back pain, numbness in your arm and leg, as well as ankle pain. If you have any pain with weightbearing of ankle, return for x-rays sooner.    IF you received an x-ray today, you will receive an invoice from [facility]. Please contact [facility] at [phone] with questions or concerns regarding your invoice.   IF you received labwork today, you will receive an invoice from [employer]. Please contact [facility] at [phone] with questions or concerns regarding your invoice.   Our billing staff will not be able to assist you with questions regarding bills from these companies.  You will be contacted with the lab results as soon as they are available. The fastest way to get your results is to activate your My Chart account. Instructions are located on the last page of this paperwork. If you have not heard from [place] regarding the results in 2 weeks, please contact this office.

## 2016-04-15 ENCOUNTER — Ambulatory Visit: Payer: 59 | Admitting: Physical Therapy

## 2016-04-22 ENCOUNTER — Ambulatory Visit: Payer: 59 | Admitting: Physical Therapy

## 2016-04-24 ENCOUNTER — Ambulatory Visit: Payer: 59 | Admitting: Physical Therapy

## 2016-04-24 DIAGNOSIS — R293 Abnormal posture: Secondary | ICD-10-CM | POA: Diagnosis not present

## 2016-04-24 DIAGNOSIS — R252 Cramp and spasm: Secondary | ICD-10-CM

## 2016-04-24 DIAGNOSIS — M545 Low back pain, unspecified: Secondary | ICD-10-CM

## 2016-04-24 DIAGNOSIS — M542 Cervicalgia: Secondary | ICD-10-CM

## 2016-04-24 DIAGNOSIS — S161XXA Strain of muscle, fascia and tendon at neck level, initial encounter: Secondary | ICD-10-CM

## 2016-04-24 NOTE — Patient Instructions (Addendum)
Over Head Pull: Narrow Grip       On back, knees bent, feet flat, band across thighs, elbows straight but relaxed. Pull hands apart (start). Keeping elbows straight, bring arms up and over head, hands toward floor. Keep pull steady on band. Hold momentarily. Return slowly, keeping pull steady, back to start. Repeat _10__ times. Band color yellow______   Side Pull: Double Arm   On back, knees bent, feet flat. Arms perpendicular to body, shoulder level, elbows straight but relaxed. Pull arms out to sides, elbows straight. Resistance band comes across collarbones, hands toward floor. Hold momentarily. Slowly return to starting position. Repeat _5-10__ times. Band color ___yellow__   Sash   On back, knees bent, feet flat, left hand on left hip, right hand above left. Pull right arm DIAGONALLY (hip to shoulder) across chest. Bring right arm along head toward floor. Hold momentarily. Slowly return to starting position. Repeat 5-10 ___ times. Do with left arm. Band color __yellow____   Shoulder Rotation: Double Arm   On back, knees bent, feet flat, elbows tucked at sides, bent 90, hands palms up. Pull hands apart and down toward floor, keeping elbows near sides. Hold momentarily. Slowly return to starting position. Repeat _5 -10__ times. Band color _yellow_____    Remove tape if irritating.

## 2016-04-24 NOTE — Therapy (Addendum)
[place], [place], [address] Phone: [phone]   Fax:  [phone]  Physical Therapy Treatment/ Discharge   Patient Details  Name: Wendy [name] MRN: [id number] Date of Birth: [birth year] Referring Provider: Corliss Parish, MD  Encounter Date: 04/24/2016      PT End of Session - 04/24/16 1753    Visit Number 8   Number of Visits 12   Date for PT Re-Evaluation 05/16/16   PT Start Time 6553   PT Stop Time 1740   PT Time Calculation (min) 65 min   Activity Tolerance Patient tolerated treatment well   Behavior During Therapy [employer] for tasks assessed/performed      Past Medical History:  Diagnosis Date  . ESOPHAGITIS, REFLUX   . Glaucoma   . HELICOBACTER PYLORI INFECTION   . HEMORRHOIDS, INTERNAL     Past Surgical History:  Procedure Laterality Date  . CESAREAN SECTION  1989  . TONSILLECTOMY     age 47 years old  . WRIST FRACTURE SURGERY  2006    There were no vitals filed for this visit.      Subjective Assessment - 04/24/16 1638    Subjective Neck 2/10,  Back 1/10 .  Does her exercises, working on her posture at work. Ankle pain 2-3/10 intermittant. worse with uneven walking , walking a lot,  using steps at work.  getting off it.    Currently in Pain? No/denies   Pain Location Abdomen   Pain Score 1   Pain Location Back   Pain Orientation Posterior   Pain Descriptors / Indicators Heaviness   Pain Frequency Intermittent   Aggravating Factors  end of the day worse,  vacuming, scrubbing floor.   Pain Relieving Factors heat, rest Motrin, flexeril   Effect of Pain on Daily Activities limits time she can do things.  Needs frequent rests   Pain Score 2   Pain Location Neck   Pain Orientation Mid;Posterior;Right   Pain Descriptors / Indicators Stabbing;Tingling  pain that catches me   Pain Radiating Towards wrist, not everyday  Occasionallyinto left to forearm.    Pain Frequency --   intermittantly rt, occasionally left   Aggravating Factors  using arms,  end of day, sometimes driving.    Pain Relieving Factors medication,  rest  change of posture stretching exerciseds                         [facility] Adult PT Treatment/Exercise - 04/24/16 0001      Neck Exercises: Supine   Neck Retraction 5 reps   Other Supine Exercise supine scapular stabilization series,: Narrow grip flexion, ER, Sash, Horizontal pulls 5-10 x each with yellow band.  Red band issued for self progression   Other Supine Exercise Cervical stabilization 1 series,  chin tuck, tuck with eyese over head, tongue to roof of mouth,.  tuck with shoulder supination/pronation,  Head press with Fist to hand press  5X each, cues. mild pain intermittantly.     Manual Therapy   Manual Therapy Soft tissue mobilization   Soft tissue mobilization upper back, neck musculature.  Lymph system activation to help decrease edema in area tape to decrease edema and to decrease pain with face down, inhibition.                  PT Education - 04/24/16 1713    Education provided Yes   Education Details supine scapular stabilization  Person(s) Educated Patient   Methods Explanation;Demonstration;Tactile cues;Verbal cues;Handout   Comprehension Verbalized understanding;Returned demonstration          PT Short Term Goals - 04/03/16 1641      PT SHORT TERM GOAL #1   Title She will be independent with inital HEP   Time 3   Period Weeks   Status Achieved     PT SHORT TERM GOAL #2   Title She will report pain decr 30% or more with reaching and twisting   Baseline No pain with reaching    Time 3   Period Weeks   Status Achieved           PT Long Term Goals - 04/24/16 1800      PT LONG TERM GOAL #1   Title she will be independent with all HEP issued   Baseline Able to progress today   Time 6   Period Weeks   Status On-going     PT LONG TERM GOAL #2   Title She will report pain as 1-2  max with all ALD's and with bending and twisting with home tasks and daily activity   Baseline 3-4/10 lower back.  No pain with reaching most of the time.   Time 6   Period Weeks   Status Partially Met               Plan - 04/24/16 1754    Clinical Impression Statement Grip LT 53 LBS,  RT 49 LBS.  Progress toward home exercise goals.  Patient is able to grade herself with Body mechanics:  B. Gradual improvement with pain in nesc and back.  Numbness intermittantly in to forearms .  She is scheduled to see an Ortho MD, DR Cay Schillings on the 30th.   No new goals met,   PT Next Visit Plan review home ex,  Measure ROM   PT Home Exercise Plan bridge, Lumbar stabilization 1, rowsred/blue band, (04/24/2016)  supine scapular stabilization.   Consulted and Agree with Plan of Care Patient      Patient will benefit from skilled therapeutic intervention in order to improve the following deficits and impairments:  Pain, Postural dysfunction, Decreased range of motion, Increased muscle spasms  Visit Diagnosis: Abnormal posture  Cervicalgia  Neck strain, initial encounter  Cramp and spasm  Midline low back pain without sciatica, unspecified chronicity  PHYSICAL THERAPY DISCHARGE SUMMARY  Visits from Start of Care:8  Current functional level related to goals / functional outcomes: Improved function but still has pain    Remaining deficits: unknown    Education / Equipment: Unknown  Plan: Patient agrees to discharge.  Patient goals were not met. Patient is being discharged due to not returning since the last visit.  ?????       Problem List Patient Active Problem List   Diagnosis Date Noted  . Chronic glaucoma 08/11/2013  . HELICOBACTER PYLORI INFECTION 08/27/2007  . GERD 08/27/2007  . ESOPHAGITIS, REFLUX 05/17/2007  . HEMORRHOIDS, INTERNAL 09/22/2003    Burnis Medin PT DPT 09/16/2016  1402 HARRIS,KAREN PTA 04/24/2016, 6:03 PM  [facility] [address] [place], [place], [address] Phone: [phone]   Fax:  [phone]  Name: SHARESE MANRIQUE MRN: [id number] Date of Birth: July 28, 1955

## 2016-08-08 DIAGNOSIS — Z1159 Encounter for screening for other viral diseases: Secondary | ICD-10-CM | POA: Diagnosis not present

## 2016-08-08 DIAGNOSIS — Z01419 Encounter for gynecological examination (general) (routine) without abnormal findings: Secondary | ICD-10-CM | POA: Diagnosis not present

## 2016-08-20 DIAGNOSIS — H25013 Cortical age-related cataract, bilateral: Secondary | ICD-10-CM | POA: Diagnosis not present

## 2016-08-20 DIAGNOSIS — H2513 Age-related nuclear cataract, bilateral: Secondary | ICD-10-CM | POA: Diagnosis not present

## 2016-08-20 DIAGNOSIS — H401121 Primary open-angle glaucoma, left eye, mild stage: Secondary | ICD-10-CM | POA: Diagnosis not present

## 2016-11-05 DIAGNOSIS — H401121 Primary open-angle glaucoma, left eye, mild stage: Secondary | ICD-10-CM | POA: Diagnosis not present

## 2016-11-05 DIAGNOSIS — H401113 Primary open-angle glaucoma, right eye, severe stage: Secondary | ICD-10-CM | POA: Diagnosis not present

## 2016-11-05 DIAGNOSIS — H04123 Dry eye syndrome of bilateral lacrimal glands: Secondary | ICD-10-CM | POA: Diagnosis not present

## 2017-02-23 DIAGNOSIS — H401121 Primary open-angle glaucoma, left eye, mild stage: Secondary | ICD-10-CM | POA: Diagnosis not present

## 2017-02-23 DIAGNOSIS — H47233 Glaucomatous optic atrophy, bilateral: Secondary | ICD-10-CM | POA: Diagnosis not present

## 2017-02-23 DIAGNOSIS — H2513 Age-related nuclear cataract, bilateral: Secondary | ICD-10-CM | POA: Diagnosis not present

## 2017-03-11 ENCOUNTER — Other Ambulatory Visit: Payer: Self-pay | Admitting: Obstetrics and Gynecology

## 2017-03-11 DIAGNOSIS — Z1231 Encounter for screening mammogram for malignant neoplasm of breast: Secondary | ICD-10-CM

## 2017-04-13 ENCOUNTER — Ambulatory Visit
Admission: RE | Admit: 2017-04-13 | Discharge: 2017-04-13 | Disposition: A | Discharge status: Home / Self Care | Payer: 59 | Source: Ambulatory Visit | Attending: Obstetrics and Gynecology | Admitting: Obstetrics and Gynecology

## 2017-04-13 DIAGNOSIS — Z1231 Encounter for screening mammogram for malignant neoplasm of breast: Secondary | ICD-10-CM | POA: Diagnosis not present

## 2017-06-10 DIAGNOSIS — H353131 Nonexudative age-related macular degeneration, bilateral, early dry stage: Secondary | ICD-10-CM | POA: Diagnosis not present

## 2017-06-10 DIAGNOSIS — H401121 Primary open-angle glaucoma, left eye, mild stage: Secondary | ICD-10-CM | POA: Diagnosis not present

## 2017-06-10 DIAGNOSIS — H401113 Primary open-angle glaucoma, right eye, severe stage: Secondary | ICD-10-CM | POA: Diagnosis not present

## 2017-07-07 DIAGNOSIS — Z860100 Personal history of colon polyps, unspecified: Secondary | ICD-10-CM

## 2017-07-07 HISTORY — DX: Personal history of colonic polyps: Z86.010

## 2017-07-07 HISTORY — PX: COLONOSCOPY: SHX5424

## 2017-07-07 HISTORY — DX: Personal history of colon polyps, unspecified: Z86.0100

## 2017-08-31 ENCOUNTER — Other Ambulatory Visit: Payer: Self-pay

## 2017-08-31 ENCOUNTER — Ambulatory Visit (INDEPENDENT_AMBULATORY_CARE_PROVIDER_SITE_OTHER): Payer: 59 | Admitting: Family Medicine

## 2017-08-31 ENCOUNTER — Telehealth: Payer: Self-pay | Admitting: *Deleted

## 2017-08-31 ENCOUNTER — Encounter: Payer: Self-pay | Admitting: Family Medicine

## 2017-08-31 VITALS — BP 122/78 | HR 78 | Temp 98.2°F | Resp 16 | Ht 63.5 in | Wt 211.4 lb

## 2017-08-31 DIAGNOSIS — Z23 Encounter for immunization: Secondary | ICD-10-CM | POA: Diagnosis not present

## 2017-08-31 DIAGNOSIS — Z0000 Encounter for general adult medical examination without abnormal findings: Secondary | ICD-10-CM | POA: Diagnosis not present

## 2017-08-31 DIAGNOSIS — Z1211 Encounter for screening for malignant neoplasm of colon: Secondary | ICD-10-CM | POA: Diagnosis not present

## 2017-08-31 DIAGNOSIS — Z1329 Encounter for screening for other suspected endocrine disorder: Secondary | ICD-10-CM | POA: Diagnosis not present

## 2017-08-31 DIAGNOSIS — E785 Hyperlipidemia, unspecified: Secondary | ICD-10-CM | POA: Diagnosis not present

## 2017-08-31 DIAGNOSIS — Z131 Encounter for screening for diabetes mellitus: Secondary | ICD-10-CM | POA: Diagnosis not present

## 2017-08-31 DIAGNOSIS — Z130 Encounter for screening for diseases of the blood and blood-forming organs and certain disorders involving the immune mechanism: Secondary | ICD-10-CM

## 2017-08-31 MED ORDER — ZOSTER VAC RECOMB ADJUVANTED 50 MCG/0.5ML IM SUSR: 0.5000 mL | Freq: Once | INTRAMUSCULAR | 1 refills | Status: AC

## 2017-08-31 NOTE — Progress Notes (Signed)
Subjective:  This chart was scribed for Wendy Agreste, MD by Tamsen Roers, at [place] at [facility].  This patient was seen in room 10 and the patient's care was started at 8:50 AM.   Chief Complaint  Patient presents with  . Annual Exam     Patient ID: Wendy [name], female    DOB: 1955-09-01, 62 y.o.   MRN: [id number]  HPI HPI Comments: Wendy [name] is a 62 y.o. female who presents to Primary Care at [facility] for a complete physical.    Cancer screening:  Colon cancer screening: last colonoscopy: March 2005. Dr. Olevia Perches- gastroenterologist (now retired). Patients maternal grandmother had colon cancer.  She would like to be referred to Dr. Therisa Doyne [facility]).  Breast cancer screening: mammogram 04-13-17. Negative, repeat in one year.  Cervical cancer screening: gynecologist: Dr. Ronita Hipps, patient had a pap smear last year (normal) and will be having one again this year.    Immunizations:  Immunization History  Administered Date(s) Administered  . Influenza Split 04/06/2012  . Influenza-Unspecified 04/18/2015  Patient would like a tetanus, flu and shingles vaccine today.    Vision: Patient has an eye doctor.  She is currently using eye drops for glaucoma and cataracts.   Visual Acuity Screening   Right eye Left eye Both eyes  Without correction: 20/25 20/40 20/25   With correction:       Depression:  Depression screen [facility] 2/9 08/31/2017 04/14/2016 03/11/2016 02/12/2016 02/05/2016  Decreased Interest 0 0 0 0 0  Down, Depressed, Hopeless 0 0 0 0 0  PHQ - 2 Score 0 0 0 0 0    Exercise/diet:Patient does not exercise. She would like to push herself to find time to be more physically active. She doesn't drink sodas or sweet teas. She cooks at home as often as she can and drinks plenty of water.    Dentist: She is planning on making an appointment with her dentist.    Lipid screening: She has not had testing in the last five years.  Lab Results  Component Value Date   CHOL 238 (H) 08/02/2012   HDL 68 08/02/2012   LDLCALC 156 (H) 08/02/2012   TRIG 69 08/02/2012   CHOLHDL 3.5 08/02/2012    Occasional joint pain: Patient has joint pain periodically.  She has not tried chondroitin or glucosamine.    Patient has an 47 year old son.    Patient Active Problem List   Diagnosis Date Noted  . Chronic glaucoma 08/11/2013  . HELICOBACTER PYLORI INFECTION 08/27/2007  . GERD 08/27/2007  . ESOPHAGITIS, REFLUX 05/17/2007  . HEMORRHOIDS, INTERNAL 09/22/2003   Past Medical History:  Diagnosis Date  . ESOPHAGITIS, REFLUX   . Glaucoma   . HELICOBACTER PYLORI INFECTION   . HEMORRHOIDS, INTERNAL    Past Surgical History:  Procedure Laterality Date  . CESAREAN SECTION  1989  . TONSILLECTOMY     age 16 years old  . WRIST FRACTURE SURGERY  2006   Allergies  Allergen Reactions  . Vicodin [Hydrocodone-Acetaminophen] Itching    SEVERE   . Brimonidine Rash   Prior to Admission medications   Medication Sig Start Date End Date Taking? Authorizing Provider  dorzolamide-timolol (COSOPT) 22.3-6.8 MG/ML ophthalmic solution PLACE 1 DROP INTO BOTH EYES 2 (TWO) TIMES DAILY. 09/28/13  Yes [provider]  fish oil-omega-3 fatty acids 1000 MG capsule Take 2 g by mouth daily.   Yes [provider]  ibuprofen (ADVIL,MOTRIN) 600 MG tablet Take 1 tablet (  600 mg total) by mouth every 6 (six) hours as needed. 01/25/16  Yes [name], Serena Y, PA-C  Latanoprostene Bunod (VYZULTA) 0.024 % SOLN Apply to eye at bedtime.   Yes [provider]  Multiple Vitamin (MULTIVITAMIN) tablet Take 1 tablet by mouth.   Yes [provider]  cyclobenzaprine (FLEXERIL) 5 MG tablet 1 pill by mouth up to every 8 hours as needed. Start with one pill by mouth each bedtime as needed due to sedation Patient not taking: Reported on 08/31/2017 04/14/16   Wendy Agreste, MD  dorzolamide (TRUSOPT) 2 % ophthalmic solution Place 1 drop into both eyes 3 (three) times daily.     [provider]  latanoprost (XALATAN) 0.005 % ophthalmic solution Place 1 drop into both eyes at bedtime.    [provider]   Social History   Socioeconomic History  . Marital status: Divorced    Spouse name: Not on file  . Number of children: Not on file  . Years of education: Not on file  . Highest education level: Not on file  Social Needs  . Financial resource strain: Not on file  . Food insecurity - worry: Not on file  . Food insecurity - inability: Not on file  . Transportation needs - medical: Not on file  . Transportation needs - non-medical: Not on file  Occupational History  . Occupation: medical    Employer: [employer]  Tobacco Use  . Smoking status: Never Smoker  . Smokeless tobacco: Never Used  Substance and Sexual Activity  . Alcohol use: No  . Drug use: No  . Sexual activity: No  Other Topics Concern  . Not on file  Social History Narrative  . Not on file    Review of Systems  HENT: Positive for tinnitus.   Musculoskeletal: Positive for arthralgias and back pain.  All other systems reviewed and are negative.      Objective:   Physical Exam  Constitutional: She is oriented to person, place, and time. She appears well-developed and well-nourished.  HENT:  Head: Normocephalic and atraumatic.  Right Ear: External ear normal.  Left Ear: External ear normal.  Mouth/Throat: Oropharynx is clear and moist.  Eyes: Conjunctivae are normal. Pupils are equal, round, and reactive to light.  Neck: Normal range of motion. Neck supple. No thyromegaly present.  Cardiovascular: Normal rate, regular rhythm, normal heart sounds and intact distal pulses.  No murmur heard. Trace pedal edema  Pulmonary/Chest: Effort normal and breath sounds normal. No respiratory distress. She has no wheezes.  Abdominal: Soft. Bowel sounds are normal. There is no tenderness.  Musculoskeletal: Normal range of motion. She exhibits no edema or tenderness.    Lymphadenopathy:    She has no cervical adenopathy.  Neurological: She is alert and oriented to person, place, and time.  Skin: Skin is warm and dry. No rash noted.  Psychiatric: She has a normal mood and affect. Her behavior is normal. Thought content normal.      Vitals:   08/31/17 0834  BP: 122/78  Pulse: 78  Resp: 16  Temp: 98.2 F (36.8 C)  TempSrc: Oral  SpO2: 97%  Weight: 211 lb 6.4 oz (95.9 kg)  Height: 5' 3.5" (1.613 m)      Assessment & Plan:   NYAZIA CANEVARI is a 62 y.o. female Annual physical exam  - -anticipatory guidance as below in AVS, screening labs above. Health maintenance items as above in HPI discussed/recommended as applicable.   Needs  flu shot - Plan: Flu Vaccine QUAD 36+ mos IM  Need for Tdap vaccination - Plan: Tdap vaccine greater than or equal to 7yo IM  Need for shingles vaccine - Plan: Zoster Vaccine Adjuvanted [facility]) injection  Special screening for malignant neoplasms, colon - Plan: Ambulatory referral to Gastroenterology  Hyperlipidemia, unspecified hyperlipidemia type - Plan: Comprehensive metabolic panel, Lipid panel  - Last tested years ago, elevated at that time. Diet/exercise discussed as part of treatment, but can decide on statin based on ASCVD risk  Screening for diabetes mellitus - Plan: Comprehensive metabolic panel  Screening, anemia, deficiency, iron - Plan: CBC  Screening for thyroid disorder - Plan: TSH  Hx mild arthralgias, osteoarthritis by history.  - over-the-counter Tylenol, trial of glucosamine/chondroitin for 6 weeks, follow-up if one specific area or sore/swollen. Meds ordered this encounter  Medications  . Zoster Vaccine Adjuvanted [facility]) injection    Sig: Inject 0.5 mLs into the muscle once for 1 dose. Repeat in 2-6 months.    Dispense:  0.5 mL    Refill:  1   Patient Instructions    Thank you for coming in today.  Shingles vaccine was sent to your pharmacy. Flu vaccine and tdap were given  today.  I will check blood work for diabetes screening, anemia screening, thyroid testing, electrolytes, and cholesterol tests.   Some form of activity/exercise most days per week with minimum goal for 150 minutes per week.  Tylenol if needed for episodic joint pain, and can try glucosamine/chondroitin over-the-counter. If that is not helping in 6 weeks, would stop that medication. Follow-up if any specific areas more sore or limiting activity.  Recheck 6 months, sooner if needed.  Keeping You Healthy  Get These Tests  Blood Pressure- Have your blood pressure checked by your healthcare provider at least once a year.  Normal blood pressure is 120/80.  Weight- Have your body mass index (BMI) calculated to screen for obesity.  BMI is a measure of body fat based on height and weight.  You can calculate your own BMI at [employer].it  Cholesterol- Have your cholesterol checked every year.  Diabetes- Have your blood sugar checked every year if you have high blood pressure, high cholesterol, a family history of diabetes or if you are overweight.  Pap Test - Have a pap test every 1 to 5 years if you have been sexually active.  If you are older than 65 and recent pap tests have been normal you may not need additional pap tests.  In addition, if you have had a hysterectomy  for benign disease additional pap tests are not necessary.  Mammogram-Yearly mammograms are essential for early detection of breast cancer  Screening for Colon Cancer- Colonoscopy starting at age 42. Screening may begin sooner depending on your family history and other health conditions.  Follow up colonoscopy as directed by your Gastroenterologist.  Screening for Osteoporosis- Screening begins at age 80 with bone density scanning, sooner if you are at higher risk for developing Osteoporosis.  Get these medicines  Calcium with Vitamin D- Your body requires 1200-1500 mg of Calcium a day and [phone] IU of Vitamin D  a day.  You can only absorb 500 mg of Calcium at a time therefore Calcium must be taken in 2 or 3 separate doses throughout the day.  Hormones- Hormone therapy has been associated with increased risk for certain cancers and heart disease.  Talk to your healthcare provider about if you need relief from menopausal symptoms.  Aspirin- Ask  your healthcare provider about taking Aspirin to prevent Heart Disease and Stroke.  Get these Immuniztions  Flu shot- Every fall  Pneumonia shot- Once after the age of 46; if you are younger ask your healthcare provider if you need a pneumonia shot.  Tetanus- Every ten years.  Zostavax- Once after the age of 28 to prevent shingles.  Take these steps  Don't smoke- Your healthcare provider can help you quit. For tips on how to quit, ask your healthcare provider or go to www.smokefree.gov or call 1-800 QUIT-NOW.  Be physically active- Exercise 5 days a week for a minimum of 30 minutes.  If you are not already physically active, start slow and gradually work up to 30 minutes of moderate physical activity.  Try walking, dancing, bike riding, swimming, etc.  Eat a healthy diet- Eat a variety of healthy foods such as fruits, vegetables, whole grains, low fat milk, low fat cheeses, yogurt, lean meats, chicken, fish, eggs, dried beans, tofu, etc.  For more information go to www.thenutritionsource.org  Dental visit- Brush and floss teeth twice daily; visit your dentist twice a year.  Eye exam- Visit your Optometrist or Ophthalmologist yearly.  Drink alcohol in moderation- Limit alcohol intake to one drink or less a day.  Never drink and drive.  Depression- Your emotional health is as important as your physical health.  If you're feeling down or losing interest in things you normally enjoy, please talk to your healthcare provider.  Seat Belts- can save your life; always wear one  Smoke/Carbon Monoxide detectors- These detectors need to be installed on the  appropriate level of your home.  Replace batteries at least once a year.  Violence- If anyone is threatening or hurting you, please tell your healthcare provider.  Living Will/ Health care power of attorney- Discuss with your healthcare provider and family.    IF you received an x-ray today, you will receive an invoice from [facility]. Please contact [facility] at [phone] with questions or concerns regarding your invoice.   IF you received labwork today, you will receive an invoice from [place]. Please contact [facility] at [phone] with questions or concerns regarding your invoice.   Our billing staff will not be able to assist you with questions regarding bills from these companies.  You will be contacted with the lab results as soon as they are available. The fastest way to get your results is to activate your My Chart account. Instructions are located on the last page of this paperwork. If you have not heard from [place] regarding the results in 2 weeks, please contact this office.      I personally performed the services described in this documentation, which was scribed in my presence. The recorded information has been reviewed and considered for accuracy and completeness, addended by me as needed, and agree with information above.  Signed,   [name], MD Primary Care at [place].  08/31/17 10:17 AM

## 2017-08-31 NOTE — Telephone Encounter (Signed)
Called [facility] on [address] spoke to [place] to get mediation name of Rx. The patient is taking estradiol-noreth 0.5-0.1 mg and entered on medication list in Epic. Dr Carlota Raspberry advised.

## 2017-08-31 NOTE — Patient Instructions (Addendum)
Thank you for coming in today.  Shingles vaccine was sent to your pharmacy. Flu vaccine and tdap were given today.  I will check blood work for diabetes screening, anemia screening, thyroid testing, electrolytes, and cholesterol tests.   Some form of activity/exercise most days per week with minimum goal for 150 minutes per week.  Tylenol if needed for episodic joint pain, and can try glucosamine/chondroitin over-the-counter. If that is not helping in 6 weeks, would stop that medication. Follow-up if any specific areas more sore or limiting activity.  Recheck 6 months, sooner if needed.  Keeping You Healthy  Get These Tests  Blood Pressure- Have your blood pressure checked by your healthcare provider at least once a year.  Normal blood pressure is 120/80.  Weight- Have your body mass index (BMI) calculated to screen for obesity.  BMI is a measure of body fat based on height and weight.  You can calculate your own BMI at [employer]  Cholesterol- Have your cholesterol checked every year.  Diabetes- Have your blood sugar checked every year if you have high blood pressure, high cholesterol, a family history of diabetes or if you are overweight.  Pap Test - Have a pap test every 1 to 5 years if you have been sexually active.  If you are older than 65 and recent pap tests have been normal you may not need additional pap tests.  In addition, if you have had a hysterectomy  for benign disease additional pap tests are not necessary.  Mammogram-Yearly mammograms are essential for early detection of breast cancer  Screening for Colon Cancer- Colonoscopy starting at age 24. Screening may begin sooner depending on your family history and other health conditions.  Follow up colonoscopy as directed by your Gastroenterologist.  Screening for Osteoporosis- Screening begins at age 16 with bone density scanning, sooner if you are at higher risk for developing Osteoporosis.  Get these  medicines  Calcium with Vitamin D- Your body requires 1200-1500 mg of Calcium a day and [phone] IU of Vitamin D a day.  You can only absorb 500 mg of Calcium at a time therefore Calcium must be taken in 2 or 3 separate doses throughout the day.  Hormones- Hormone therapy has been associated with increased risk for certain cancers and heart disease.  Talk to your healthcare provider about if you need relief from menopausal symptoms.  Aspirin- Ask your healthcare provider about taking Aspirin to prevent Heart Disease and Stroke.  Get these Immuniztions  Flu shot- Every fall  Pneumonia shot- Once after the age of 43; if you are younger ask your healthcare provider if you need a pneumonia shot.  Tetanus- Every ten years.  Zostavax- Once after the age of 58 to prevent shingles.  Take these steps  Don't smoke- Your healthcare provider can help you quit. For tips on how to quit, ask your healthcare provider or go to www.smokefree.gov or call 1-800 QUIT-NOW.  Be physically active- Exercise 5 days a week for a minimum of 30 minutes.  If you are not already physically active, start slow and gradually work up to 30 minutes of moderate physical activity.  Try walking, dancing, bike riding, swimming, etc.  Eat a healthy diet- Eat a variety of healthy foods such as fruits, vegetables, whole grains, low fat milk, low fat cheeses, yogurt, lean meats, chicken, fish, eggs, dried beans, tofu, etc.  For more information go to www.thenutritionsource.org  Dental visit- Brush and floss teeth twice daily; visit your dentist twice a  year.  Eye exam- Visit your Optometrist or Ophthalmologist yearly.  Drink alcohol in moderation- Limit alcohol intake to one drink or less a day.  Never drink and drive.  Depression- Your emotional health is as important as your physical health.  If you're feeling down or losing interest in things you normally enjoy, please talk to your healthcare provider.  Seat Belts- can  save your life; always wear one  Smoke/Carbon Monoxide detectors- These detectors need to be installed on the appropriate level of your home.  Replace batteries at least once a year.  Violence- If anyone is threatening or hurting you, please tell your healthcare provider.  Living Will/ Health care power of attorney- Discuss with your healthcare provider and family.    IF you received an x-ray today, you will receive an invoice from [facility]. Please contact [facility] at [phone] with questions or concerns regarding your invoice.   IF you received labwork today, you will receive an invoice from [place]. Please contact [facility] at [phone] with questions or concerns regarding your invoice.   Our billing staff will not be able to assist you with questions regarding bills from these companies.  You will be contacted with the lab results as soon as they are available. The fastest way to get your results is to activate your My Chart account. Instructions are located on the last page of this paperwork. If you have not heard from [place] regarding the results in 2 weeks, please contact this office.

## 2017-09-01 LAB — LIPID PANEL
Chol/HDL Ratio: 3 ratio (ref 0.0–4.4)
Cholesterol, Total: 225 mg/dL — ABNORMAL HIGH (ref 100–199)
HDL: 75 mg/dL (ref >39)
LDL Calculated: 137 mg/dL — ABNORMAL HIGH (ref 0–99)
Triglycerides: 67 mg/dL (ref 0–149)
VLDL Cholesterol Cal: 13 mg/dL (ref 5–40)

## 2017-09-01 LAB — CBC
HEMATOCRIT: 45.7 % (ref 34.0–46.6)
Hemoglobin: 15.3 g/dL (ref 11.1–15.9)
MCH: 28.2 pg (ref 26.6–33.0)
MCHC: 33.5 g/dL (ref 31.5–35.7)
MCV: 84 fL (ref 79–97)
PLATELETS: 208 10*3/uL (ref 150–379)
RBC: 5.42 x10E6/uL — ABNORMAL HIGH (ref 3.77–5.28)
RDW: 14.2 % (ref 12.3–15.4)
WBC: 6.2 10*3/uL (ref 3.4–10.8)

## 2017-09-01 LAB — COMPREHENSIVE METABOLIC PANEL
ALT: 11 IU/L (ref 0–32)
AST: 17 IU/L (ref 0–40)
Albumin/Globulin Ratio: 1.4 (ref 1.2–2.2)
Albumin: 4.3 g/dL (ref 3.5–5.5)
Alkaline Phosphatase: 77 IU/L (ref 39–117)
BILIRUBIN TOTAL: 0.3 mg/dL (ref 0.0–1.2)
BUN/Creatinine Ratio: 14 (ref 9–23)
BUN: 10 mg/dL (ref 6–24)
CALCIUM: 9.4 mg/dL (ref 8.7–10.2)
CHLORIDE: 106 mmol/L (ref 96–106)
CO2: 21 mmol/L (ref 20–29)
Creatinine, Ser: 0.72 mg/dL (ref 0.57–1.00)
GFR calc Af Amer: 106 mL/min/{1.73_m2} (ref >59)
GFR calc non Af Amer: 92 mL/min/{1.73_m2} (ref >59)
Globulin, Total: 3 g/dL (ref 1.5–4.5)
Glucose: 98 mg/dL (ref 65–99)
POTASSIUM: 4.1 mmol/L (ref 3.5–5.2)
Sodium: 140 mmol/L (ref 134–144)
Total Protein: 7.3 g/dL (ref 6.0–8.5)

## 2017-09-01 LAB — TSH: TSH: 1.38 u[IU]/mL (ref 0.450–4.500)

## 2017-09-16 DIAGNOSIS — H04123 Dry eye syndrome of bilateral lacrimal glands: Secondary | ICD-10-CM | POA: Diagnosis not present

## 2017-09-16 DIAGNOSIS — H401113 Primary open-angle glaucoma, right eye, severe stage: Secondary | ICD-10-CM | POA: Diagnosis not present

## 2017-09-16 DIAGNOSIS — H25013 Cortical age-related cataract, bilateral: Secondary | ICD-10-CM | POA: Diagnosis not present

## 2017-10-06 DIAGNOSIS — Z1211 Encounter for screening for malignant neoplasm of colon: Secondary | ICD-10-CM | POA: Diagnosis not present

## 2017-10-30 ENCOUNTER — Encounter: Payer: Self-pay | Admitting: Family Medicine

## 2017-10-30 DIAGNOSIS — K648 Other hemorrhoids: Secondary | ICD-10-CM | POA: Diagnosis not present

## 2017-10-30 DIAGNOSIS — R933 Abnormal findings on diagnostic imaging of other parts of digestive tract: Secondary | ICD-10-CM | POA: Insufficient documentation

## 2017-10-30 DIAGNOSIS — Z1211 Encounter for screening for malignant neoplasm of colon: Secondary | ICD-10-CM | POA: Diagnosis not present

## 2017-10-30 DIAGNOSIS — D126 Benign neoplasm of colon, unspecified: Secondary | ICD-10-CM | POA: Diagnosis not present

## 2017-11-05 DIAGNOSIS — Z01419 Encounter for gynecological examination (general) (routine) without abnormal findings: Secondary | ICD-10-CM | POA: Diagnosis not present

## 2017-11-13 ENCOUNTER — Encounter: Payer: Self-pay | Admitting: Family Medicine

## 2017-12-04 DIAGNOSIS — H401113 Primary open-angle glaucoma, right eye, severe stage: Secondary | ICD-10-CM | POA: Diagnosis not present

## 2017-12-04 DIAGNOSIS — H2513 Age-related nuclear cataract, bilateral: Secondary | ICD-10-CM | POA: Diagnosis not present

## 2017-12-04 DIAGNOSIS — H04123 Dry eye syndrome of bilateral lacrimal glands: Secondary | ICD-10-CM | POA: Diagnosis not present

## 2017-12-23 DIAGNOSIS — H401113 Primary open-angle glaucoma, right eye, severe stage: Secondary | ICD-10-CM | POA: Diagnosis not present

## 2017-12-23 DIAGNOSIS — H01009 Unspecified blepharitis unspecified eye, unspecified eyelid: Secondary | ICD-10-CM | POA: Diagnosis not present

## 2017-12-23 DIAGNOSIS — H47233 Glaucomatous optic atrophy, bilateral: Secondary | ICD-10-CM | POA: Diagnosis not present

## 2018-01-06 DIAGNOSIS — K648 Other hemorrhoids: Secondary | ICD-10-CM | POA: Diagnosis not present

## 2018-01-06 DIAGNOSIS — Z85038 Personal history of other malignant neoplasm of large intestine: Secondary | ICD-10-CM | POA: Diagnosis not present

## 2018-01-06 DIAGNOSIS — D126 Benign neoplasm of colon, unspecified: Secondary | ICD-10-CM | POA: Diagnosis not present

## 2018-03-01 ENCOUNTER — Ambulatory Visit: Payer: 59 | Admitting: Family Medicine

## 2018-03-17 DIAGNOSIS — N950 Postmenopausal bleeding: Secondary | ICD-10-CM | POA: Diagnosis not present

## 2018-04-02 DIAGNOSIS — N950 Postmenopausal bleeding: Secondary | ICD-10-CM | POA: Diagnosis not present

## 2018-04-06 DIAGNOSIS — H47233 Glaucomatous optic atrophy, bilateral: Secondary | ICD-10-CM | POA: Diagnosis not present

## 2018-04-06 DIAGNOSIS — H401121 Primary open-angle glaucoma, left eye, mild stage: Secondary | ICD-10-CM | POA: Diagnosis not present

## 2018-04-06 DIAGNOSIS — H401113 Primary open-angle glaucoma, right eye, severe stage: Secondary | ICD-10-CM | POA: Diagnosis not present

## 2018-04-10 ENCOUNTER — Telehealth: Payer: Self-pay | Admitting: Family Medicine

## 2018-04-10 NOTE — Telephone Encounter (Signed)
Copied from [place] [phone]. Topic: General - Other >> Apr 09, 2018  5:23 PM Keene Breath wrote: Reason for CRM: Patient wanted to ask nurse or doctor if she could go to an independent lab to have the blood work instead of coming in for labs.  Please advise.  CB# [phone]

## 2018-04-12 ENCOUNTER — Ambulatory Visit: Payer: 59 | Admitting: Family Medicine

## 2018-04-19 NOTE — Telephone Encounter (Signed)
Pt message sent to Dr. Carlota Raspberry. Pt questioned if she should go to independent lab for blood work?

## 2018-04-20 NOTE — Telephone Encounter (Signed)
Tried to call pt with on success and VM is full, will try again later.

## 2018-04-20 NOTE — Telephone Encounter (Signed)
More info - why independent lab? Due for repeat labs, and has appt scheduled on the 25th.

## 2018-04-21 NOTE — Telephone Encounter (Signed)
Attempted to call pt. Mailbox full Called work and no answer

## 2018-04-25 NOTE — Telephone Encounter (Signed)
Finally able to leave VM for pt with Dr. Vonna Kotyk message Why does she feel the need to go to independent lab?  Due for follow up labs.  Asked pt to return call to the office with her response.  Advised we could do labs and send to [facility] or [facility] depending on her insur participation.

## 2018-04-29 NOTE — Telephone Encounter (Signed)
Pt calling back to advise she thinks she was misunderstood. Pt states she is closer to a lab that is over near [facility]. (off [address], [facility]?) pt has gotten labs there before. Pt states she does not necessarily not want to come in.  Pt states she is not symptomatic, does not really need an appt, just repeat lab) Wants to know if that is OK? Please call the pt back asap, because she has the fup tomorrow.  Pt will follow up after labs if needed, but a call back with results would be good.

## 2018-04-29 NOTE — Telephone Encounter (Signed)
Returned call to pt.  Advised she needed to be seen (overdue since August 2019) for evaluation. Dr. Carlota Raspberry will review her diet, exercise and labs will be drawn to evaluate cholesterol.  Pt states she wants labs drawn and doesn't want office visit.  Later states labs will not be available at an evaluation if drawn on the day of the evaluation.   Offered appt or if pt unhappy with provider would need to be evaluated by another provider. Pt continued to state she wanted labs drawn before appt.  Later states she has appt tomorrow 2:40pm Offered to have pt come in at 8am and get labs drawn.  Pt challenged why that would be offered- had not been offered previously.  Offered when an appt is as late as 2:40pm we offer to have pt come in early am for labs so they will not be NPO for so long.  Pt continued to challenge that offer and decided to come in for appt only.

## 2018-04-30 ENCOUNTER — Encounter: Payer: Self-pay | Admitting: Family Medicine

## 2018-04-30 ENCOUNTER — Ambulatory Visit (INDEPENDENT_AMBULATORY_CARE_PROVIDER_SITE_OTHER): Payer: 59 | Admitting: Family Medicine

## 2018-04-30 ENCOUNTER — Other Ambulatory Visit: Payer: Self-pay

## 2018-04-30 VITALS — BP 136/84 | HR 76 | Temp 97.7°F | Resp 16 | Ht 63.39 in | Wt 212.0 lb

## 2018-04-30 DIAGNOSIS — E785 Hyperlipidemia, unspecified: Secondary | ICD-10-CM | POA: Diagnosis not present

## 2018-04-30 DIAGNOSIS — Z23 Encounter for immunization: Secondary | ICD-10-CM

## 2018-04-30 DIAGNOSIS — Z1159 Encounter for screening for other viral diseases: Secondary | ICD-10-CM | POA: Diagnosis not present

## 2018-04-30 NOTE — Patient Instructions (Addendum)
I will check cholesterol levels again today, but based on last test numbers would indicate need for statin.  As we discussed meeting with a lipid specialist is an option given your family history of heart issues, but without hypertension or diabetes may be less of a risk for you.  Depending on today's readings, can repeat testing again in 6 months.  I placed the future lab orders for that time so we can have those to review in office.    If you have lab work done today you will be contacted with your lab results within the next 2 weeks.  If you have not heard from [place] then please contact us. The fastest way to get your results is to register for My Chart.   IF you received an x-ray today, you will receive an invoice from [facility]. Please contact [facility] at [phone] with questions or concerns regarding your invoice.   IF you received labwork today, you will receive an invoice from [place]. Please contact [facility] at [phone] with questions or concerns regarding your invoice.   Our billing staff will not be able to assist you with questions regarding bills from these companies.  You will be contacted with the lab results as soon as they are available. The fastest way to get your results is to activate your My Chart account. Instructions are located on the last page of this paperwork. If you have not heard from [place] regarding the results in 2 weeks, please contact this office.

## 2018-04-30 NOTE — Telephone Encounter (Signed)
Patient seen in office today, was planning on just having blood work done prior to today's visit.  Labs ordered, and future order placed for next visit.

## 2018-04-30 NOTE — Progress Notes (Signed)
Subjective:  .By signing my name below, I, Wendy [name], attest that this documentation has been prepared under the direction and in the presence of Wendy Ray, MD. Electronically Signed: Moises [name], [place]. 04/30/2018 , 3:42 PM .  Patient was seen in Room 3 .   Patient ID: Wendy [name], female    DOB: June 30, 1956, 62 y.o.   MRN: [id number] Chief Complaint  Patient presents with  . Hyperlipidemia    follow-up    HPI Wendy [name] is a 62 y.o. female Here for follow up of hyperlipidemia. There was a misunderstanding with the patient per phone note regarding having labs drawn at another location proactively; resolved today.   Hyperlipidemia Lab Results  Component Value Date   CHOL 225 (H) 08/31/2017   HDL 75 08/31/2017   LDLCALC 137 (H) 08/31/2017   TRIG 67 08/31/2017   CHOLHDL 3.0 08/31/2017   Lab Results  Component Value Date   ALT 11 08/31/2017   AST 17 08/31/2017   ALKPHOS 77 08/31/2017   BILITOT 0.3 08/31/2017   Her lipids were elevated in February, but her LDL had improved from 156 to 137. She is currently on fish oil and omega-3's; not on a statin.   She hasn't been as diligent with her fish oil and omega-3's recently. She mentions her sister having mixed hyperlipidemia with heart disease (blockage, open heart), and brother has heart issues too; both siblings are on statin, as well as having HTN and diabetes.   The 10-year ASCVD risk score Wendy [name] DC Wendy [name]., et al., 2013) is: 5.7%   Values used to calculate the score:     Age: 6 years     Sex: Female     Is Non-Hispanic African American: Yes     Diabetic: No     Tobacco smoker: No     Systolic [name] Pressure: 546 mmHg     Is BP treated: No     HDL Cholesterol: 75 mg/dL     Total Cholesterol: 225 mg/dL   Health maintenance Hep C screening: added to [name] work today.   HIV screening: believes she had it done about 3 years ago, negative/non-reactive with OBGYN. Also had it checked when her son was  born about 18 years ago.   Cervical cancer screening: followed by OBGYN, Dr. Ronita Hipps; had abnormal bleeding a month ago, thought due to estradiol. She had hysterogram and biopsy that were negative.   Colonoscopy: due next year.   Patient Active Problem List   Diagnosis Date Noted  . Abnormal colonoscopy 10/30/2017  . Chronic glaucoma 08/11/2013  . HELICOBACTER PYLORI INFECTION 08/27/2007  . GERD 08/27/2007  . ESOPHAGITIS, REFLUX 05/17/2007  . HEMORRHOIDS, INTERNAL 09/22/2003   Past Medical History:  Diagnosis Date  . ESOPHAGITIS, REFLUX   . Glaucoma   . HELICOBACTER PYLORI INFECTION   . HEMORRHOIDS, INTERNAL    Past Surgical History:  Procedure Laterality Date  . CESAREAN SECTION  1989  . TONSILLECTOMY     age 54 years old  . WRIST FRACTURE SURGERY  2006   Allergies  Allergen Reactions  . Vicodin [Hydrocodone-Acetaminophen] Itching    SEVERE   . Brimonidine Rash   Prior to Admission medications   Medication Sig Start Date End Date Taking? Authorizing Provider  cyclobenzaprine (FLEXERIL) 5 MG tablet 1 pill by mouth up to every 8 hours as needed. Start with one pill by mouth each bedtime as needed due to sedation Patient not taking: Reported on 08/31/2017 04/14/16  [name], MD  dorzolamide (TRUSOPT) 2 % ophthalmic solution Place 1 drop into both eyes 3 (three) times daily.    [provider]  dorzolamide-timolol (COSOPT) 22.3-6.8 MG/ML ophthalmic solution PLACE 1 DROP INTO BOTH EYES 2 (TWO) TIMES DAILY. 09/28/13   [provider]  Estradiol-Norethindrone Acet 0.5-0.1 MG tablet Take 1 tablet by mouth daily.    [provider]  fish oil-omega-3 fatty acids 1000 MG capsule Take 2 g by mouth daily.    [provider]  ibuprofen (ADVIL,MOTRIN) 600 MG tablet Take 1 tablet (600 mg total) by mouth every 6 (six) hours as needed. 01/25/16   [name], [name], PA-C  latanoprost (XALATAN) 0.005 % ophthalmic solution Place 1 drop into both eyes at  bedtime.    [provider]  Latanoprostene Bunod (VYZULTA) 0.024 % SOLN Apply to eye at bedtime.    [provider]  Multiple Vitamin (MULTIVITAMIN) tablet Take 1 tablet by mouth.    [provider]   Social History   Socioeconomic History  . Marital status: Divorced    Spouse name: Not on file  . Number of children: Not on file  . Years of education: Not on file  . Highest education level: Not on file  Occupational History  . Occupation: Sport and exercise psychologist: [employer]  . Financial resource strain: Not on file  . Food insecurity:    Worry: Not on file    Inability: Not on file  . Transportation needs:    Medical: Not on file    Non-medical: Not on file  Tobacco Use  . Smoking status: Never Smoker  . Smokeless tobacco: Never Used  Substance and Sexual Activity  . Alcohol use: No  . Drug use: No  . Sexual activity: Never  Lifestyle  . Physical activity:    Days per week: Not on file    Minutes per session: Not on file  . Stress: Not on file  Relationships  . Social connections:    Talks on phone: Not on file    Gets together: Not on file    Attends religious service: Not on file    Active member of club or organization: Not on file    Attends meetings of clubs or organizations: Not on file    Relationship status: Not on file  . Intimate partner violence:    Fear of current or ex partner: Not on file    Emotionally abused: Not on file    Physically abused: Not on file    Forced sexual activity: Not on file  Other Topics Concern  . Not on file  Social History Narrative  . Not on file   Review of Systems  Constitutional: Negative for chills, fatigue, fever and unexpected weight change.  Respiratory: Negative for cough.   Gastrointestinal: Negative for constipation, diarrhea, nausea and vomiting.  Skin: Negative for rash and wound.  Neurological: Negative for dizziness, weakness and headaches.         Objective:   Physical Exam  Constitutional: She is oriented to person, place, and time. She appears well-developed and well-nourished. No distress.  HENT:  Head: Normocephalic and atraumatic.  Eyes: Pupils are equal, round, and reactive to light. EOM are normal.  Neck: Neck supple.  Cardiovascular: Normal rate.  Pulmonary/Chest: Effort normal. No respiratory distress.  Musculoskeletal: Normal range of motion.  Neurological: She is alert and oriented to person, place, and time.  Skin: Skin is warm  and dry.  Psychiatric: She has a normal mood and affect. Her behavior is normal.  Nursing note and vitals reviewed.   Vitals:   04/30/18 1504  BP: 136/84  Pulse: 76  Resp: 16  Temp: 97.7 F (36.5 C)  TempSrc: Oral  SpO2: 95%  Weight: 212 lb (96.2 kg)  Height: 5' 3.39" (1.61 m)        Assessment & Plan:   RONNETTA CURRINGTON is a 62 y.o. female Hyperlipidemia, unspecified hyperlipidemia type - Plan: Comprehensive metabolic panel, Lipid panel, Lipid panel, Comprehensive metabolic panel  -Hyperlipidemia, but great HDL level.  Previous ASCVD risk did not indicate need for statin.  Family history of cardiac disease in siblings but they have other risk factors including diabetes and hypertension.  Option of meeting with lipid specialist to decide on statin discussed, but will start with repeat lipid testing today, then recheck in 6 months.  Need for prophylactic vaccination and inoculation against influenza - Plan: Flu Vaccine QUAD 36+ mos IM  Need for hepatitis C screening test - Plan: Hepatitis C antibody   No orders of the defined types were placed in this encounter.  Patient Instructions   I will check cholesterol levels again today, but based on last test numbers would indicate need for statin.  As we discussed meeting with a lipid specialist is an option given your family history of heart issues, but without hypertension or diabetes may be less of a risk for you.  Depending on  today's readings, can repeat testing again in 6 months.  I placed the future lab orders for that time so we can have those to review in office.    If you have lab work done today you will be contacted with your lab results within the next 2 weeks.  If you have not heard from [place] then please contact us. The fastest way to get your results is to register for My Chart.   IF you received an x-[name] today, you will receive an invoice from [facility]. Please contact [facility] at [phone] with questions or concerns regarding your invoice.   IF you received labwork today, you will receive an invoice from [place]. Please contact [facility] at [phone] with questions or concerns regarding your invoice.   Our billing staff will not be able to assist you with questions regarding bills from these companies.  You will be contacted with the lab results as soon as they are available. The fastest way to get your results is to activate your My Chart account. Instructions are located on the last page of this paperwork. If you have not heard from [place] regarding the results in 2 weeks, please contact this office.       I personally performed the services described in this documentation, which was scribed in my presence. The recorded information has been reviewed and considered for accuracy and completeness, addended by me as needed, and agree with information above.  Signed,   Wendy Ray, MD [facility].  04/30/18 3:59 PM

## 2018-05-01 LAB — LIPID PANEL
CHOL/HDL RATIO: 3.3 ratio (ref 0.0–4.4)
Cholesterol, Total: 217 mg/dL — ABNORMAL HIGH (ref 100–199)
HDL: 66 mg/dL (ref >39)
LDL Calculated: 132 mg/dL — ABNORMAL HIGH (ref 0–99)
TRIGLYCERIDES: 95 mg/dL (ref 0–149)
VLDL Cholesterol Cal: 19 mg/dL (ref 5–40)

## 2018-05-01 LAB — COMPREHENSIVE METABOLIC PANEL
ALT: 12 IU/L (ref 0–32)
AST: 22 IU/L (ref 0–40)
Albumin/Globulin Ratio: 1.5 (ref 1.2–2.2)
Albumin: 4.2 g/dL (ref 3.6–4.8)
Alkaline Phosphatase: 74 IU/L (ref 39–117)
BUN/Creatinine Ratio: 12 (ref 12–28)
BUN: 9 mg/dL (ref 8–27)
Bilirubin Total: 0.6 mg/dL (ref 0.0–1.2)
CALCIUM: 9.2 mg/dL (ref 8.7–10.3)
CO2: 24 mmol/L (ref 20–29)
Chloride: 103 mmol/L (ref 96–106)
Creatinine, Ser: 0.75 mg/dL (ref 0.57–1.00)
GFR, EST AFRICAN AMERICAN: 100 mL/min/{1.73_m2} (ref >59)
GFR, EST NON AFRICAN AMERICAN: 87 mL/min/{1.73_m2} (ref >59)
Globulin, Total: 2.8 g/dL (ref 1.5–4.5)
Glucose: 91 mg/dL (ref 65–99)
Potassium: 3.7 mmol/L (ref 3.5–5.2)
Sodium: 142 mmol/L (ref 134–144)
TOTAL PROTEIN: 7 g/dL (ref 6.0–8.5)

## 2018-05-01 LAB — HEPATITIS C ANTIBODY: Hep C Virus Ab: 0.1 s/co ratio (ref 0.0–0.9)

## 2018-05-17 ENCOUNTER — Encounter: Payer: Self-pay | Admitting: Family Medicine

## 2018-06-01 ENCOUNTER — Other Ambulatory Visit: Payer: Self-pay | Admitting: Obstetrics and Gynecology

## 2018-06-01 DIAGNOSIS — Z1231 Encounter for screening mammogram for malignant neoplasm of breast: Secondary | ICD-10-CM

## 2018-06-21 ENCOUNTER — Other Ambulatory Visit: Payer: Self-pay | Admitting: Obstetrics and Gynecology

## 2018-07-12 ENCOUNTER — Ambulatory Visit
Admission: RE | Admit: 2018-07-12 | Discharge: 2018-07-12 | Disposition: A | Discharge status: Home / Self Care | Payer: 59 | Source: Ambulatory Visit | Attending: Obstetrics and Gynecology | Admitting: Obstetrics and Gynecology

## 2018-07-12 DIAGNOSIS — Z1231 Encounter for screening mammogram for malignant neoplasm of breast: Secondary | ICD-10-CM

## 2018-08-26 ENCOUNTER — Other Ambulatory Visit: Payer: Self-pay | Admitting: Obstetrics and Gynecology

## 2018-09-13 ENCOUNTER — Encounter (HOSPITAL_BASED_OUTPATIENT_CLINIC_OR_DEPARTMENT_OTHER): Payer: Self-pay | Admitting: *Deleted

## 2018-09-14 ENCOUNTER — Encounter (HOSPITAL_BASED_OUTPATIENT_CLINIC_OR_DEPARTMENT_OTHER): Payer: Self-pay

## 2018-09-15 ENCOUNTER — Encounter (HOSPITAL_BASED_OUTPATIENT_CLINIC_OR_DEPARTMENT_OTHER): Payer: Self-pay | Admitting: *Deleted

## 2018-09-15 ENCOUNTER — Other Ambulatory Visit: Payer: Self-pay

## 2018-09-15 NOTE — Progress Notes (Signed)
Spoke with patient via telephone for pre op interview. No solids after MN but can have clear liquids until 0700 DOS. Patient verbalized understanding of clear liquid diet and no milk products. No medications AM of surgery. Arrival time 1100.

## 2018-09-17 ENCOUNTER — Other Ambulatory Visit: Payer: Self-pay

## 2018-09-17 ENCOUNTER — Ambulatory Visit (HOSPITAL_BASED_OUTPATIENT_CLINIC_OR_DEPARTMENT_OTHER): Payer: 59 | Admitting: Anesthesiology

## 2018-09-17 ENCOUNTER — Ambulatory Visit (HOSPITAL_BASED_OUTPATIENT_CLINIC_OR_DEPARTMENT_OTHER)
Admission: RE | Admit: 2018-09-17 | Discharge: 2018-09-17 | Disposition: A | Discharge status: Home / Self Care | Payer: 59 | Attending: Obstetrics and Gynecology | Admitting: Obstetrics and Gynecology

## 2018-09-17 ENCOUNTER — Encounter (HOSPITAL_BASED_OUTPATIENT_CLINIC_OR_DEPARTMENT_OTHER): Payer: Self-pay | Admitting: Emergency Medicine

## 2018-09-17 ENCOUNTER — Encounter (HOSPITAL_BASED_OUTPATIENT_CLINIC_OR_DEPARTMENT_OTHER)
Admission: RE | Disposition: A | Discharge status: Home / Self Care | Payer: Self-pay | Source: Home / Self Care | Attending: Obstetrics and Gynecology

## 2018-09-17 DIAGNOSIS — N840 Polyp of corpus uteri: Secondary | ICD-10-CM | POA: Diagnosis not present

## 2018-09-17 DIAGNOSIS — N950 Postmenopausal bleeding: Secondary | ICD-10-CM | POA: Diagnosis not present

## 2018-09-17 DIAGNOSIS — Z6837 Body mass index (BMI) 37.0-37.9, adult: Secondary | ICD-10-CM | POA: Diagnosis not present

## 2018-09-17 HISTORY — DX: Hyperlipidemia, unspecified: E78.5

## 2018-09-17 HISTORY — PX: HYSTEROSCOPY WITH D & C: SHX1775

## 2018-09-17 HISTORY — DX: Other cervical disc degeneration, unspecified cervical region: M50.30

## 2018-09-17 HISTORY — DX: Other intervertebral disc degeneration, lumbar region: M51.36

## 2018-09-17 HISTORY — DX: Unspecified fracture of unspecified forearm, initial encounter for closed fracture: S52.90XA

## 2018-09-17 HISTORY — DX: Spondylosis without myelopathy or radiculopathy, thoracic region: M47.814

## 2018-09-17 HISTORY — DX: Other intervertebral disc degeneration, lumbar region without mention of lumbar back pain or lower extremity pain: M51.369

## 2018-09-17 HISTORY — DX: Tinnitus, bilateral: H93.13

## 2018-09-17 HISTORY — DX: Personal history of colonic polyps: Z86.010

## 2018-09-17 LAB — CBC
HCT: 47.7 % — ABNORMAL HIGH (ref 36.0–46.0)
Hemoglobin: 14.4 g/dL (ref 12.0–15.0)
MCH: 27.1 pg (ref 26.0–34.0)
MCHC: 30.2 g/dL (ref 30.0–36.0)
MCV: 89.8 fL (ref 80.0–100.0)
Platelets: 179 10*3/uL (ref 150–400)
RBC: 5.31 MIL/uL — ABNORMAL HIGH (ref 3.87–5.11)
RDW: 13.8 % (ref 11.5–15.5)
WBC: 6.3 10*3/uL (ref 4.0–10.5)
nRBC: 0 % (ref 0.0–0.2)

## 2018-09-17 SURGERY — DILATATION AND CURETTAGE /HYSTEROSCOPY
Anesthesia: General

## 2018-09-17 MED ORDER — FENTANYL CITRATE (PF) 100 MCG/2ML IJ SOLN
INTRAMUSCULAR | Status: AC
  Filled 2018-09-17: qty 2

## 2018-09-17 MED ORDER — CEFAZOLIN SODIUM-DEXTROSE 2-4 GM/100ML-% IV SOLN
2.0000 g | INTRAVENOUS | Status: AC
  Administered 2018-09-17: 2 g via INTRAVENOUS
  Filled 2018-09-17: qty 100

## 2018-09-17 MED ORDER — DEXAMETHASONE SODIUM PHOSPHATE 10 MG/ML IJ SOLN
INTRAMUSCULAR | Status: AC
  Filled 2018-09-17: qty 1

## 2018-09-17 MED ORDER — LACTATED RINGERS IV SOLN
INTRAVENOUS | Status: DC
  Administered 2018-09-17 (×2): via INTRAVENOUS
  Filled 2018-09-17: qty 1000

## 2018-09-17 MED ORDER — VASOPRESSIN 20 UNIT/ML IV SOLN
INTRAVENOUS | Status: DC | PRN
  Administered 2018-09-17: 18 mL via INTRAMUSCULAR

## 2018-09-17 MED ORDER — DEXAMETHASONE SODIUM PHOSPHATE 4 MG/ML IJ SOLN
INTRAMUSCULAR | Status: DC | PRN
  Administered 2018-09-17: 8 mg via INTRAVENOUS

## 2018-09-17 MED ORDER — MIDAZOLAM HCL 2 MG/2ML IJ SOLN
0.5000 mg | Freq: Once | INTRAMUSCULAR | Status: DC | PRN
  Filled 2018-09-17: qty 2

## 2018-09-17 MED ORDER — FENTANYL CITRATE (PF) 100 MCG/2ML IJ SOLN
25.0000 ug | INTRAMUSCULAR | Status: DC | PRN
  Administered 2018-09-17: 25 ug via INTRAVENOUS
  Filled 2018-09-17: qty 1

## 2018-09-17 MED ORDER — LIDOCAINE 2% (20 MG/ML) 5 ML SYRINGE
INTRAMUSCULAR | Status: AC
  Filled 2018-09-17: qty 5

## 2018-09-17 MED ORDER — FENTANYL CITRATE (PF) 100 MCG/2ML IJ SOLN
INTRAMUSCULAR | Status: DC | PRN
  Administered 2018-09-17 (×2): 25 ug via INTRAVENOUS
  Administered 2018-09-17: 50 ug via INTRAVENOUS

## 2018-09-17 MED ORDER — PROPOFOL 10 MG/ML IV BOLUS
INTRAVENOUS | Status: AC
  Filled 2018-09-17: qty 20

## 2018-09-17 MED ORDER — ARTIFICIAL TEARS OPHTHALMIC OINT
TOPICAL_OINTMENT | OPHTHALMIC | Status: AC
  Filled 2018-09-17: qty 3.5

## 2018-09-17 MED ORDER — LIDOCAINE 2% (20 MG/ML) 5 ML SYRINGE
INTRAMUSCULAR | Status: DC | PRN
  Administered 2018-09-17 (×2): 40 mg via INTRAVENOUS

## 2018-09-17 MED ORDER — PROPOFOL 10 MG/ML IV BOLUS
INTRAVENOUS | Status: DC | PRN
  Administered 2018-09-17: 30 mg via INTRAVENOUS
  Administered 2018-09-17: 150 mg via INTRAVENOUS
  Administered 2018-09-17: 20 mg via INTRAVENOUS

## 2018-09-17 MED ORDER — ONDANSETRON HCL 4 MG/2ML IJ SOLN
INTRAMUSCULAR | Status: AC
  Filled 2018-09-17: qty 2

## 2018-09-17 MED ORDER — MIDAZOLAM HCL 2 MG/2ML IJ SOLN
INTRAMUSCULAR | Status: AC
  Filled 2018-09-17: qty 2

## 2018-09-17 MED ORDER — MEPERIDINE HCL 25 MG/ML IJ SOLN
6.2500 mg | INTRAMUSCULAR | Status: DC | PRN
  Filled 2018-09-17: qty 1

## 2018-09-17 MED ORDER — ONDANSETRON HCL 4 MG/2ML IJ SOLN
INTRAMUSCULAR | Status: DC | PRN
  Administered 2018-09-17: 4 mg via INTRAVENOUS

## 2018-09-17 MED ORDER — CEFAZOLIN SODIUM-DEXTROSE 2-4 GM/100ML-% IV SOLN
INTRAVENOUS | Status: AC
  Filled 2018-09-17: qty 100

## 2018-09-17 MED ORDER — KETOROLAC TROMETHAMINE 30 MG/ML IJ SOLN
INTRAMUSCULAR | Status: DC | PRN
  Administered 2018-09-17 (×2): 15 mg via INTRAVENOUS

## 2018-09-17 MED ORDER — BUPIVACAINE HCL (PF) 0.25 % IJ SOLN
INTRAMUSCULAR | Status: DC | PRN
  Administered 2018-09-17: 20 mL

## 2018-09-17 MED ORDER — TRAMADOL HCL 50 MG PO TABS: 50.0000 mg | ORAL_TABLET | Freq: Four times a day (QID) | ORAL | 0 refills | Status: DC | PRN

## 2018-09-17 MED ORDER — KETOROLAC TROMETHAMINE 30 MG/ML IJ SOLN
INTRAMUSCULAR | Status: AC
  Filled 2018-09-17: qty 1

## 2018-09-17 MED ORDER — MIDAZOLAM HCL 5 MG/5ML IJ SOLN
INTRAMUSCULAR | Status: DC | PRN
  Administered 2018-09-17: 2 mg via INTRAVENOUS

## 2018-09-17 MED ORDER — PROMETHAZINE HCL 25 MG/ML IJ SOLN
6.2500 mg | INTRAMUSCULAR | Status: DC | PRN
  Filled 2018-09-17: qty 1

## 2018-09-17 SURGICAL SUPPLY — 16 items
CATH ROBINSON RED A/P 16FR (CATHETERS) ×2 IMPLANT
DECANTER SPIKE VIAL GLASS SM (MISCELLANEOUS) ×4 IMPLANT
DEVICE MYOSURE LITE (MISCELLANEOUS) ×2 IMPLANT
DEVICE MYOSURE REACH (MISCELLANEOUS) IMPLANT
GLOVE BIO SURGEON STRL SZ7.5 (GLOVE) ×2 IMPLANT
GLOVE BIOGEL PI IND STRL 7.0 (GLOVE) ×1 IMPLANT
GLOVE BIOGEL PI INDICATOR 7.0 (GLOVE) ×1
GOWN STRL REUS W/TWL LRG LVL3 (GOWN DISPOSABLE) ×4 IMPLANT
KIT PROCEDURE FLUENT (KITS) ×2 IMPLANT
NEEDLE SPNL 22GX3.5 QUINCKE BK (NEEDLE) ×2 IMPLANT
PACK VAGINAL MINOR WOMEN LF (CUSTOM PROCEDURE TRAY) ×2 IMPLANT
PAD OB MATERNITY 4.3X12.25 (PERSONAL CARE ITEMS) ×2 IMPLANT
SEAL ROD LENS SCOPE MYOSURE (ABLATOR) ×2 IMPLANT
SYR CONTROL 10ML LL (SYRINGE) ×2 IMPLANT
SYRINGE 1CC 25X5/8 TB ECLIPSE (MISCELLANEOUS) ×2 IMPLANT
TOWEL OR 17X26 10 PK STRL BLUE (TOWEL DISPOSABLE) ×4 IMPLANT

## 2018-09-17 NOTE — Anesthesia Procedure Notes (Signed)
Procedure Name: LMA Insertion Date/Time: 09/17/2018 1:11 PM Performed by: Annye Asa, MD Pre-anesthesia Checklist: Patient identified, Emergency Drugs available, Suction available and Patient being monitored Patient Re-evaluated:Patient Re-evaluated prior to induction Oxygen Delivery Method: Circle system utilized Preoxygenation: Pre-oxygenation with 100% oxygen Induction Type: IV induction Ventilation: Mask ventilation without difficulty LMA: LMA inserted LMA Size: 4.0 Number of attempts: 1 Airway Equipment and Method: Bite block Placement Confirmation: positive ETCO2 Tube secured with: Tape Dental Injury: Teeth and Oropharynx as per pre-operative assessment

## 2018-09-17 NOTE — Op Note (Signed)
09/17/2018  1:32 PM  PATIENT:  Wendy [name]  63 y.o. female  PRE-OPERATIVE DIAGNOSIS:  Postmenopausal Bleeding  POST-OPERATIVE DIAGNOSIS:  Endometrial polyp x 2  PROCEDURE:  Procedure(s): DILATATION AND CURETTAGE DIAGNOSTIC HYSTEROSCOPY MYOSURE RESECTION OF ENDOMETRIAL POLYPS  SURGEON:  Surgeon(s): Brien Few, MD  ASSISTANTS: none   ANESTHESIA:   local and general  ESTIMATED BLOOD LOSS: 5 mL   DRAINS: none   LOCAL MEDICATIONS USED:  MARCAINE    and Amount: 20 ml  SPECIMEN:  Source of Specimen:  EMC AND POLYPS  DISPOSITION OF SPECIMEN:  PATHOLOGY  COUNTS:  YES  DICTATION #: [id number]  PLAN OF CARE: DC HOME  PATIENT DISPOSITION:  PACU - hemodynamically stable.

## 2018-09-17 NOTE — Discharge Instructions (Signed)
DISCHARGE INSTRUCTIONS: HYSTEROSCOPY  The following instructions have been prepared to help you care for yourself upon your return home.   May take Ibuprofen after 7:15pm today.  May take stool softner while taking narcotic pain medication to prevent constipation.  Drink plenty of water.  Personal hygiene:  Use sanitary pads for vaginal drainage, not tampons.  Shower the day after your procedure.  NO tub baths, pools or Jacuzzis for 2-3 weeks.  Wipe front to back after using the bathroom.  Activity and limitations:  Do NOT drive or operate any equipment for 24 hours. The effects of anesthesia are still present and drowsiness may result.  Do NOT rest in bed all day.  Walking is encouraged.  Walk up and down stairs slowly.  You may resume your normal activity in one to two days or as indicated by your physician. Sexual activity: NO intercourse for at least 2 weeks after the procedure, or as indicated by your Doctor.  Diet: Eat a light meal as desired this evening. You may resume your usual diet tomorrow.  Return to Work: You may resume your work activities in one to two days or as indicated by Marine scientist.  What to expect after your surgery: Expect to have vaginal bleeding/discharge for 2-3 days and spotting for up to 10 days. It is not unusual to have soreness for up to 1-2 weeks. You may have a slight burning sensation when you urinate for the first day. Mild cramps may continue for a couple of days. You may have a regular period in 2-6 weeks.  Call your doctor for any of the following:  Excessive vaginal bleeding or clotting, saturating and changing one pad every hour.  Inability to urinate 6 hours after discharge from hospital.  Pain not relieved by pain medication.  Fever of 100.4 F or greater.  Unusual vaginal discharge or odor.  Return to office _________________Call for an appointment ___________________ Patients signature:  ______________________ Nurses signature ________________________  Post Anesthesia Care Unit [phone]    Post Anesthesia Home Care Instructions  Activity: Get plenty of rest for the remainder of the day. A responsible individual must stay with you for 24 hours following the procedure.  For the next 24 hours, DO NOT: -Drive a car -Paediatric nurse -Drink alcoholic beverages -Take any medication unless instructed by your physician -Make any legal decisions or sign important papers.  Meals: Start with liquid foods such as gelatin or soup. Progress to regular foods as tolerated. Avoid greasy, spicy, heavy foods. If nausea and/or vomiting occur, drink only clear liquids until the nausea and/or vomiting subsides. Call your physician if vomiting continues.  Special Instructions/Symptoms: Your throat may feel dry or sore from the anesthesia or the breathing tube placed in your throat during surgery. If this causes discomfort, gargle with warm salt water. The discomfort should disappear within 24 hours.  If you had a scopolamine patch placed behind your ear for the management of post- operative nausea and/or vomiting:  1. The medication in the patch is effective for 72 hours, after which it should be removed.  Wrap patch in a tissue and discard in the trash. Wash hands thoroughly with soap and water. 2. You may remove the patch earlier than 72 hours if you experience unpleasant side effects which may include dry mouth, dizziness or visual disturbances. 3. Avoid touching the patch. Wash your hands with soap and water after contact with the patch.

## 2018-09-17 NOTE — Anesthesia Preprocedure Evaluation (Addendum)
Anesthesia Evaluation  Patient identified by MRN, date of birth, ID band Patient awake    Reviewed: Allergy & Precautions, NPO status , Patient's Chart, lab work & pertinent test results  History of Anesthesia Complications Negative for: history of anesthetic complications  Airway Mallampati: IV  TM Distance: >3 FB Neck ROM: Full    Dental  (+) Dental Advisory Given   Pulmonary neg pulmonary ROS,    breath sounds clear to auscultation       Cardiovascular (-) hypertension(-) anginanegative cardio ROS   Rhythm:Regular Rate:Normal     Neuro/Psych DDD glaucoma    GI/Hepatic Neg liver ROS, GERD  Controlled,  Endo/Other  Morbid obesity  Renal/GU negative Renal ROS     Musculoskeletal  (+) Arthritis ,   Abdominal (+) + obese,   Peds  Hematology negative hematology ROS (+)   Anesthesia Other Findings   Reproductive/Obstetrics                            Anesthesia Physical Anesthesia Plan  ASA: II  Anesthesia Plan: General   Post-op Pain Management:    Induction: Intravenous  PONV Risk Score and Plan: 3 and Ondansetron, Dexamethasone and Treatment may vary due to age or medical condition  Airway Management Planned: LMA  Additional Equipment:   Intra-op Plan:   Post-operative Plan:   Informed Consent: I have reviewed the patients History and Physical, chart, labs and discussed the procedure including the risks, benefits and alternatives for the proposed anesthesia with the patient or authorized representative who has indicated his/her understanding and acceptance.     Dental advisory given  Plan Discussed with: CRNA and Surgeon  Anesthesia Plan Comments:         Anesthesia Quick Evaluation

## 2018-09-17 NOTE — H&P (Signed)
Wendy [name] is an 63 y.o. female. Recurrent PMB for surgical evaluation  Pertinent Gynecological History: Menses: flow is light Bleeding: post menopausal bleeding Contraception: none DES exposure: denies Blood transfusions: none Sexually transmitted diseases: no past history Previous GYN Procedures: DNC  Last mammogram: normal Date: 2020 Last pap: normal Date: 2020 OB History: G1, P1   Menstrual History: Menarche age: 47 No LMP recorded. Patient is postmenopausal.    Past Medical History:  Diagnosis Date  . DDD (degenerative disc disease), cervical   . DDD (degenerative disc disease), lumbar   . ESOPHAGITIS, REFLUX   . Glaucoma   . HELICOBACTER PYLORI INFECTION   . HEMORRHOIDS, INTERNAL   . History of colon polyps 2019  . Hyperlipidemia   . Radius fracture 2006   Left, Distal  . Thoracic spondylosis   . Tinnitus, bilateral 2013    Past Surgical History:  Procedure Laterality Date  . CESAREAN SECTION  1989  . COLONOSCOPY  2019  . TONSILLECTOMY     age 61 years old  . WRIST FRACTURE SURGERY  2006    Family History  Problem Relation Age of Onset  . Diabetes Mother   . Brain cancer Mother   . Cancer Mother        breast-right  . Breast cancer Mother   . Diabetes Father   . Hypertension Father   . Cancer Father        lung  . Heart disease Father   . Diabetes Sister   . Hypertension Sister   . Glaucoma Sister   . Diabetes Brother   . Hypertension Brother   . Heart disease Brother        congestive heart failure  . Cancer Maternal Grandmother        colon    Social History:  reports that she has never smoked. She has never used smokeless tobacco. She reports that she does not drink alcohol or use drugs.  Allergies:  Allergies  Allergen Reactions  . Vicodin [Hydrocodone-Acetaminophen] Itching    SEVERE   . Brimonidine Rash    No medications prior to admission.    Review of Systems  Constitutional: Negative.   All other systems reviewed  and are negative.   Height 5\' 3"  (1.6 m), weight 95.3 kg. Physical Exam  Nursing note and vitals reviewed. Constitutional: She is oriented to person, place, and time. She appears well-developed and well-nourished.  HENT:  Head: Normocephalic and atraumatic.  Neck: Normal range of motion. Neck supple.  Cardiovascular: Normal rate and regular rhythm.  Respiratory: Effort normal and breath sounds normal.  GI: Soft. Bowel sounds are normal.  Genitourinary:    Vagina and uterus normal.   Musculoskeletal: Normal range of motion.  Neurological: She is alert and oriented to person, place, and time. She has normal reflexes.  Skin: Skin is warm and dry.  Psychiatric: She has a normal mood and affect.    No results found for this or any previous visit (from the past 24 hour(s)).  No results found.  Assessment/Plan: PMB Diag HS D&C Consent done  Abdon Petrosky J 09/17/2018, 6:44 AM

## 2018-09-17 NOTE — Anesthesia Postprocedure Evaluation (Signed)
Anesthesia Post Note  Patient: Wendy [name]  Procedure(s) Performed: DILATATION AND CURETTAGE Marlene Bast (N/A )     Patient location during evaluation: PACU Anesthesia Type: General Level of consciousness: awake and alert, oriented and patient cooperative Pain management: pain level controlled Vital Signs Assessment: post-procedure vital signs reviewed and stable Respiratory status: spontaneous breathing, nonlabored ventilation and respiratory function stable Cardiovascular status: blood pressure returned to baseline and stable Postop Assessment: no apparent nausea or vomiting Anesthetic complications: no    Last Vitals:  Vitals:   09/17/18 1430 09/17/18 1445  BP: (!) 117/92 120/86  Pulse: (!) 54 (!) 51  Resp: 13 16  Temp:  36.5 C  SpO2: 98% 99%    Last Pain:  Vitals:   09/17/18 1445  TempSrc:   PainSc: 2                  Savahna Casados,E. Shanena Pellegrino

## 2018-09-17 NOTE — Progress Notes (Signed)
Patient seen and examined. Consent witnessed and signed. No changes noted. Update completed. BP (!) 167/95   Pulse 76   Temp 97.8 F (36.6 C) (Oral)   Resp 16   Ht 5\' 3"  (1.6 m)   Wt 95.3 kg   SpO2 100%   BMI 37.24 kg/m   CBC    Component Value Date/Time   WBC 6.3 09/17/2018 1100   RBC 5.31 (H) 09/17/2018 1100   HGB 14.4 09/17/2018 1100   HGB 15.3 08/31/2017 1146   HCT 47.7 (H) 09/17/2018 1100   HCT 45.7 08/31/2017 1146   PLT 179 09/17/2018 1100   PLT 208 08/31/2017 1146   MCV 89.8 09/17/2018 1100   MCV 84 08/31/2017 1146   MCH 27.1 09/17/2018 1100   MCHC 30.2 09/17/2018 1100   RDW 13.8 09/17/2018 1100   RDW 14.2 08/31/2017 1146   LYMPHSABS 3.5 04/11/2014 1632   MONOABS 0.7 04/11/2014 1632   EOSABS 0.1 04/11/2014 1632   BASOSABS 0.0 04/11/2014 1632

## 2018-09-17 NOTE — Op Note (Signed)
NAME: Wendy [name], Wendy [name] MEDICAL RECORD [id number] ACCOUNT [id number] DATE OF BIRTH:[birth year] FACILITY: [facility] LOCATION: [facility] PHYSICIAN:Shaquill Iseman J. Ronita Hipps, MD  OPERATIVE REPORT  DATE OF PROCEDURE:  09/17/2018  PREOPERATIVE DIAGNOSIS:  Postmenopausal bleeding.  POSTOPERATIVE DIAGNOSES:  Postmenopausal bleeding, multiple endometrial polyps.  PROCEDURE:  Diagnostic hysteroscopy, dilatation and curettage, MyoSure resection of endometrial polyps.  SURGEON:  Brien Few, MD  ASSISTANT:  None.  FLUID DEFICIT:  50 mL.  COMPLICATIONS:  None.  DRAINS:  None.  COUNTS:  Correct.  DISPOSITION:  The patient was taken to recovery in good condition.  SPECIMENS:  Endometrial curettings and polyp to pathology.  BRIEF OPERATIVE NOTE:  After being apprised of the risks of anesthesia, infection, bleeding, and surrounding organs, possible need for repair, delayed versus immediate complications including bowel and bladder injury, possible need for repair, the  patient was brought to the operating room where she was administered general anesthetic without complications, prepped and draped in usual sterile fashion.  Feet were placed in [place].  Exam under anesthesia revealed a small anteflexed uterus  and no adnexal masses.  Dilute Marcaine solution was then placed.  Standard paracervical block 20 mL total.  Dilute Pitressin solution placed 3 and 9 o'clock, 18 mL total.  Cervix easily dilated to 23-Pratt dilator.  Hysteroscope placed.  Visualization  revealed a posterior wall endometrial polyp and a sessile polyp in the fundal area.  MyoSure device was placed and both areas resected in their entirety without difficulty.  D and C then performed using sharp curettage in a 4-quadrant method.  Good  hemostasis was noted.  All instruments were removed.  The patient tolerated the procedure well, was awakened and transferred to recovery in good condition.  LN/NUANCE  D:09/17/2018  T:09/17/2018 JOB:[id number]

## 2018-09-17 NOTE — Transfer of Care (Signed)
  Last Vitals:  Vitals Value Taken Time  BP 131/87 09/17/2018  1:50 PM  Temp    Pulse 62 09/17/2018  1:55 PM  Resp 15 09/17/2018  1:55 PM  SpO2 99 % 09/17/2018  1:55 PM  Vitals shown include unvalidated device data.  Last Pain:  Vitals:   09/17/18 1114  TempSrc: Oral         Immediate Anesthesia Transfer of Care Note  Patient: Wendy [name]  Procedure(s) Performed: Procedure(s) (LRB): DILATATION AND CURETTAGE /HYSTEROSCOPY MYOSURE (N/A)  Patient Location: PACU  Anesthesia Type: General  Level of Consciousness: awake, alert  and oriented  Airway & Oxygen Therapy: Patient Spontanous Breathing and Patient connected to nasal cannula oxygen  Post-op Assessment: Report given to PACU RN and Post -op Vital signs reviewed and stable  Post vital signs: Reviewed and stable  Complications: No apparent anesthesia complications

## 2018-09-20 ENCOUNTER — Encounter (HOSPITAL_BASED_OUTPATIENT_CLINIC_OR_DEPARTMENT_OTHER): Payer: Self-pay | Admitting: Obstetrics and Gynecology

## 2018-10-01 DIAGNOSIS — Z09 Encounter for follow-up examination after completed treatment for conditions other than malignant neoplasm: Secondary | ICD-10-CM | POA: Diagnosis not present

## 2018-11-19 DIAGNOSIS — K635 Polyp of colon: Secondary | ICD-10-CM | POA: Diagnosis not present

## 2018-11-19 DIAGNOSIS — Z85038 Personal history of other malignant neoplasm of large intestine: Secondary | ICD-10-CM | POA: Diagnosis not present

## 2019-04-29 ENCOUNTER — Other Ambulatory Visit: Payer: Self-pay | Admitting: Obstetrics and Gynecology

## 2019-05-02 ENCOUNTER — Other Ambulatory Visit: Payer: Self-pay | Admitting: Obstetrics and Gynecology

## 2019-05-02 DIAGNOSIS — N6310 Unspecified lump in the right breast, unspecified quadrant: Secondary | ICD-10-CM

## 2019-05-09 ENCOUNTER — Other Ambulatory Visit: Payer: Self-pay

## 2019-05-09 ENCOUNTER — Ambulatory Visit (HOSPITAL_COMMUNITY)
Admission: EM | Admit: 2019-05-09 | Discharge: 2019-05-09 | Disposition: A | Discharge status: Home / Self Care | Payer: 59 | Attending: Emergency Medicine | Admitting: Emergency Medicine

## 2019-05-09 ENCOUNTER — Encounter (HOSPITAL_COMMUNITY): Payer: Self-pay

## 2019-05-09 DIAGNOSIS — S161XXA Strain of muscle, fascia and tendon at neck level, initial encounter: Secondary | ICD-10-CM

## 2019-05-09 DIAGNOSIS — M25531 Pain in right wrist: Secondary | ICD-10-CM

## 2019-05-09 DIAGNOSIS — S39012A Strain of muscle, fascia and tendon of lower back, initial encounter: Secondary | ICD-10-CM

## 2019-05-09 MED ORDER — IBUPROFEN 600 MG PO TABS: 600.0000 mg | ORAL_TABLET | Freq: Four times a day (QID) | ORAL | 0 refills | Status: DC | PRN

## 2019-05-09 MED ORDER — TIZANIDINE HCL 4 MG PO TABS: 4.0000 mg | ORAL_TABLET | Freq: Three times a day (TID) | ORAL | 0 refills | Status: DC | PRN
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
## 2019-05-09 NOTE — Discharge Instructions (Addendum)
°  People tend to feel worse over the next several days, but most people are back to normal in 1 week. A small number of people will have persistent pain for up to six weeks. Take the 600 mg of ibuprofen with 1 gram of tylenol 3-4 times a day.  Zanaflex for muscle spasms.  I would advise deep tissue massage over the next several days.  Go to www.goodrx.com to look up your medications. This will give you a list of where you can find your prescriptions at the most affordable prices. Or ask the pharmacist what the cash price is, or if they have any other discount programs available to help make your medication more affordable. This can be less expensive than what you would pay with insurance.

## 2019-05-13 ENCOUNTER — Other Ambulatory Visit: Payer: Self-pay | Admitting: Obstetrics and Gynecology

## 2019-05-13 ENCOUNTER — Ambulatory Visit (INDEPENDENT_AMBULATORY_CARE_PROVIDER_SITE_OTHER): Payer: 59 | Admitting: Family Medicine

## 2019-05-13 ENCOUNTER — Ambulatory Visit
Admission: RE | Admit: 2019-05-13 | Discharge: 2019-05-13 | Disposition: A | Discharge status: Home / Self Care | Payer: 59 | Source: Ambulatory Visit | Attending: Obstetrics and Gynecology | Admitting: Obstetrics and Gynecology

## 2019-05-13 ENCOUNTER — Other Ambulatory Visit: Payer: Self-pay

## 2019-05-13 ENCOUNTER — Encounter: Payer: Self-pay | Admitting: Family Medicine

## 2019-05-13 DIAGNOSIS — N6310 Unspecified lump in the right breast, unspecified quadrant: Secondary | ICD-10-CM

## 2019-05-13 DIAGNOSIS — Z23 Encounter for immunization: Secondary | ICD-10-CM

## 2019-05-13 DIAGNOSIS — L819 Disorder of pigmentation, unspecified: Secondary | ICD-10-CM | POA: Diagnosis not present

## 2019-05-13 DIAGNOSIS — M545 Low back pain, unspecified: Secondary | ICD-10-CM

## 2019-05-13 DIAGNOSIS — S161XXD Strain of muscle, fascia and tendon at neck level, subsequent encounter: Secondary | ICD-10-CM | POA: Diagnosis not present

## 2019-05-13 DIAGNOSIS — R599 Enlarged lymph nodes, unspecified: Secondary | ICD-10-CM
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
## 2019-05-18 ENCOUNTER — Other Ambulatory Visit: Payer: 59

## 2019-05-20 ENCOUNTER — Ambulatory Visit
Admission: RE | Admit: 2019-05-20 | Discharge: 2019-05-20 | Disposition: A | Discharge status: Home / Self Care | Payer: 59 | Source: Ambulatory Visit | Attending: Obstetrics and Gynecology | Admitting: Obstetrics and Gynecology

## 2019-05-20 ENCOUNTER — Other Ambulatory Visit: Payer: Self-pay

## 2019-05-20 DIAGNOSIS — N6310 Unspecified lump in the right breast, unspecified quadrant: Secondary | ICD-10-CM

## 2019-05-20 DIAGNOSIS — R599 Enlarged lymph nodes, unspecified: Secondary | ICD-10-CM

## 2019-05-27 ENCOUNTER — Telehealth: Payer: Self-pay | Admitting: Hematology and Oncology

## 2019-05-27 ENCOUNTER — Telehealth: Payer: Self-pay | Admitting: Oncology

## 2019-05-27 NOTE — Telephone Encounter (Signed)
Received a msg to schedule Wendy [name] for an urgent appt for dx of breast cancer. Ms. Robarge has been cld and scheduled to see Dr. Jana Hakim on 11/23 at 1:30pm w/labs at 1pm. I cld and lft the appt date and time on the pt's vm. Breast navigators have been updated.

## 2019-05-27 NOTE — Telephone Encounter (Signed)
Ms. Geelan returned my call and has been rescheduled to see Dr. Lindi Adie on 11/25 at 1pm and scheduled for a virtual visit w/Karen Florene Glen on 11/25 at 10am. She's been made aware someone will call her to setup the appt.

## 2019-05-30 ENCOUNTER — Ambulatory Visit: Payer: 59 | Admitting: Oncology

## 2019-05-30 ENCOUNTER — Other Ambulatory Visit: Payer: 59

## 2019-05-31 ENCOUNTER — Encounter: Payer: Self-pay | Admitting: Genetic Counselor

## 2019-05-31 NOTE — Progress Notes (Signed)
[place] CONSULT NOTE  Patient Care Team: [name], MD as PCP - General (Family Medicine)  CHIEF COMPLAINTS/PURPOSE OF CONSULTATION:  Newly diagnosed breast cancer  HISTORY OF PRESENTING ILLNESS:  Wendy [name] 63 y.o. female is here because of recent diagnosis of invasive ductal carcinoma of the right breast. The patient palpated a right breast mass. Diagnostic mammogram and [place] on 05/13/19 showed a 1.7cm mass at the 11 o'clock position in the right breast with 1 axillary lymph node with cortical thickening measuring 0.6cm. Biopsy on 05/23/19 showed invasive ductal carcinoma with DCIS, grade 3, HER-2 negative (0), ER+ 100%, PR+ 30%, Ki67 20% in the breast and lymph node. She presents to the clinic today for initial evaluation and discussion of treatment options.   I reviewed her records extensively and collaborated the history with the patient.  SUMMARY OF ONCOLOGIC HISTORY: Oncology History  Malignant neoplasm of upper-outer quadrant of right breast in female, estrogen receptor positive ([place])  05/20/2019 Initial Diagnosis   Palpable right breast mass superficial, ultrasound 11 o'clock position 1.7 cm: Biopsy revealed grade 3 IDC with DCIS ER 100%, PR 30%, Ki-67 20%, HER-2 by IHC negative, axilla lymph node biopsy positive   06/01/2019 Cancer Staging   Staging form: Breast, AJCC 8th Edition - Clinical stage from 06/01/2019: Stage IIA (cT1c, cN1, cM0, G3, ER+, PR+, HER2-) - Signed by Nicholas Lose, MD on 06/01/2019     MEDICAL HISTORY:  Past Medical History:  Diagnosis Date  . Breast cancer ([place])   . Colon cancer ([place])   . DDD (degenerative disc disease), cervical   . DDD (degenerative disc disease), lumbar   . ESOPHAGITIS, REFLUX   . Glaucoma   . HELICOBACTER PYLORI INFECTION   . HEMORRHOIDS, INTERNAL   . History of colon cancer   . History of colon polyps 2019  . Hyperlipidemia   . Radius fracture 2006   Left, Distal  . Thoracic spondylosis    . Tinnitus, bilateral 2013    SURGICAL HISTORY: Past Surgical History:  Procedure Laterality Date  . CESAREAN SECTION  1989  . COLONOSCOPY  2019  . HYSTEROSCOPY W/D&C N/A 09/17/2018   Procedure: DILATATION AND CURETTAGE Marlene Bast;  Surgeon: Brien Few, MD;  [facility]: [facility];  Service: Gynecology;  Laterality: N/A;  . TONSILLECTOMY     age 79 years old  . WRIST FRACTURE SURGERY  2006    SOCIAL HISTORY: Social History   Socioeconomic History  . Marital status: Significant Other    Spouse name: Not on file  . Number of children: Not on file  . Years of education: Not on file  . Highest education level: Not on file  Occupational History  . Occupation: Sport and exercise psychologist: [employer]  . Financial resource strain: Not on file  . Food insecurity    Worry: Not on file    Inability: Not on file  . Transportation needs    Medical: Not on file    Non-medical: Not on file  Tobacco Use  . Smoking status: Never Smoker  . Smokeless tobacco: Never Used  Substance and Sexual Activity  . Alcohol use: No  . Drug use: No  . Sexual activity: Not Currently  Lifestyle  . Physical activity    Days per week: Not on file    Minutes per session: Not on file  . Stress: Not on file  Relationships  . Social Herbalist on  phone: Not on file    Gets together: Not on file    Attends religious service: Not on file    Active member of club or organization: Not on file    Attends meetings of clubs or organizations: Not on file    Relationship status: Not on file  . Intimate partner violence    Fear of current or ex partner: Not on file    Emotionally abused: Not on file    Physically abused: Not on file    Forced sexual activity: Not on file  Other Topics Concern  . Not on file  Social History Narrative  . Not on file    FAMILY HISTORY: Family History  Problem Relation Age of Onset  . Diabetes Mother   .  Brain cancer Mother        mets  . Cancer Mother        breast-right  . Breast cancer Mother        d. 61  . Diabetes Father   . Hypertension Father   . Cancer Father        lung  . Heart disease Father   . Lung cancer Father   . Diabetes Sister   . Hypertension Sister   . Glaucoma Sister   . Diabetes Brother   . Hypertension Brother   . Heart disease Brother        congestive heart failure  . Other Son 16       car accident  . Colon cancer Maternal Grandmother 11       d. 29  . Dementia Paternal Grandfather   . Breast cancer Other        MGMs mother  . Other Maternal Uncle        possibly drowning  . Brain cancer Niece 1       d. 2 years    ALLERGIES:  is allergic to vicodin [hydrocodone-acetaminophen] and brimonidine.  MEDICATIONS:  Current Outpatient Medications  Medication Sig Dispense Refill  . ALPRAZolam (XANAX) 0.25 MG tablet Take 0.25 mg by mouth at bedtime as needed for anxiety.    . dorzolamide-timolol (COSOPT) 22.3-6.8 MG/ML ophthalmic solution PLACE 1 DROP INTO BOTH EYES 2 (TWO) TIMES DAILY.    . fish oil-omega-3 fatty acids 1000 MG capsule Take 2 g by mouth daily.    Marland Kitchen ibuprofen (ADVIL) 600 MG tablet Take 1 tablet (600 mg total) by mouth every 6 (six) hours as needed. 30 tablet 0  . latanoprost (XALATAN) 0.005 % ophthalmic solution 1 drop at bedtime.    . Multiple Vitamin (MULTIVITAMIN) tablet Take 1 tablet by mouth.    Marland Kitchen tiZANidine (ZANAFLEX) 4 MG tablet Take 1 tablet (4 mg total) by mouth every 8 (eight) hours as needed for muscle spasms. 30 tablet 0   No current facility-administered medications for this visit.     REVIEW OF SYSTEMS:   Constitutional: Denies fevers, chills or abnormal night sweats Eyes: Denies blurriness of vision, double vision or watery eyes Ears, nose, mouth, throat, and face: Denies mucositis or sore throat Respiratory: Denies cough, dyspnea or wheezes Cardiovascular: Denies palpitation, chest discomfort or lower extremity  swelling Gastrointestinal:  Denies nausea, heartburn or change in bowel habits Skin: Denies abnormal skin rashes Lymphatics: Denies new lymphadenopathy or easy bruising Neurological:Denies numbness, tingling or new weaknesses Behavioral/Psych: Mood is stable, no new changes  Breast: (+) Palpable right breast mass All other systems were reviewed with the patient and are negative.  PHYSICAL EXAMINATION: ECOG  PERFORMANCE STATUS: 1 - Symptomatic but completely ambulatory  Vitals:   06/01/19 1303  BP: (!) 149/70  Pulse: (!) 58  Resp: 18  Temp: 98 F (36.7 C)  SpO2: 100%   Filed Weights   06/01/19 1303  Weight: 217 lb 3.2 oz (98.5 kg)    GENERAL:alert, no distress and comfortable SKIN: skin color, texture, turgor are normal, no rashes or significant lesions EYES: normal, conjunctiva are pink and non-injected, sclera clear OROPHARYNX:no exudate, no erythema and lips, buccal mucosa, and tongue normal  NECK: supple, thyroid normal size, non-tender, without nodularity LYMPH:  no palpable lymphadenopathy in the cervical, axillary or inguinal LUNGS: clear to auscultation and percussion with normal breathing effort HEART: regular rate & rhythm and no murmurs and no lower extremity edema ABDOMEN:abdomen soft, non-tender and normal bowel sounds Musculoskeletal:no cyanosis of digits and no clubbing  PSYCH: alert & oriented x 3 with fluent speech NEURO: no focal motor/sensory deficits BREAST: No palpable nodules in breast. No palpable axillary or supraclavicular lymphadenopathy (exam performed in the presence of a chaperone)   LABORATORY DATA:  I have reviewed the data as listed Lab Results  Component Value Date   WBC 6.3 09/17/2018   HGB 14.4 09/17/2018   HCT 47.7 (H) 09/17/2018   MCV 89.8 09/17/2018   PLT 179 09/17/2018   Lab Results  Component Value Date   NA 142 04/30/2018   K 3.7 04/30/2018   CL 103 04/30/2018   CO2 24 04/30/2018    RADIOGRAPHIC STUDIES: I have  personally reviewed the radiological reports and agreed with the findings in the report.  ASSESSMENT AND PLAN:  Malignant neoplasm of upper-outer quadrant of right breast in female, estrogen receptor positive ([place]) 05/20/2019:Palpable right breast mass superficial, ultrasound 11 o'clock position 1.7 cm: Biopsy revealed grade 3 IDC with DCIS ER 100%, PR 30%, Ki-67 20%, HER-2 by IHC negative, axilla lymph node biopsy positive T1CN1A stage IIa clinical stage  Pathology and radiology counseling: Discussed with the patient, the details of pathology including the type of breast cancer,the clinical staging, the significance of ER, PR and HER-2/neu receptors and the implications for treatment. After reviewing the pathology in detail, we proceeded to discuss the different treatment options between surgery, radiation, chemotherapy, antiestrogen therapies.  Recommendation: 1.  Breast conserving surgery with targeted node dissection 2.  MammaPrint testing to determine if she would benefit from chemotherapy 3.  Adjuvant radiation therapy 4.  Follow-up adjuvant antiestrogen therapy Genetic testing  Mammaprint counseling: MINDACT is a prospective, randomized phase III controlled trial that investigates the clinical utility of MammaPrint, when compared to standard clinical pathological criteria, with 6,693 patients enrolled from over 111 institutions. Clinical high-risk patients with a Low Risk MammaPrint result, including 48% node-positive, had 5-year distant metastasis-free survival rate in excess of 94 percent, whether randomized to receive adjuvant chemotherapy or not proving MammaPrint's ability to safely identify Low Risk patients.  Return to clinic after surgery to discuss the final pathology report.   All questions were answered. The patient knows to call the clinic with any problems, questions or concerns.   Rulon Eisenmenger, MD, MPH 06/01/2019    I, Molly Dorshimer, am acting as scribe for Nicholas Lose, MD.  I have reviewed the above documentation for accuracy and completeness, and I agree with the above.

## 2019-06-01 ENCOUNTER — Encounter: Payer: Self-pay | Admitting: Genetic Counselor

## 2019-06-01 ENCOUNTER — Inpatient Hospital Stay: Payer: 59

## 2019-06-01 ENCOUNTER — Other Ambulatory Visit: Payer: Self-pay | Admitting: Genetic Counselor

## 2019-06-01 ENCOUNTER — Inpatient Hospital Stay (HOSPITAL_BASED_OUTPATIENT_CLINIC_OR_DEPARTMENT_OTHER): Payer: 59 | Admitting: Genetic Counselor

## 2019-06-01 ENCOUNTER — Other Ambulatory Visit: Payer: Self-pay

## 2019-06-01 ENCOUNTER — Inpatient Hospital Stay: Payer: 59 | Attending: Oncology | Admitting: Hematology and Oncology

## 2019-06-01 ENCOUNTER — Encounter: Payer: 59 | Admitting: Genetic Counselor

## 2019-06-01 DIAGNOSIS — Z808 Family history of malignant neoplasm of other organs or systems: Secondary | ICD-10-CM

## 2019-06-01 DIAGNOSIS — C50411 Malignant neoplasm of upper-outer quadrant of right female breast: Secondary | ICD-10-CM | POA: Diagnosis not present

## 2019-06-01 DIAGNOSIS — C773 Secondary and unspecified malignant neoplasm of axilla and upper limb lymph nodes: Secondary | ICD-10-CM

## 2019-06-01 DIAGNOSIS — Z170 Estrogen receptor positive status [ER+]: Secondary | ICD-10-CM

## 2019-06-01 DIAGNOSIS — Z803 Family history of malignant neoplasm of breast: Secondary | ICD-10-CM

## 2019-06-01 DIAGNOSIS — Z801 Family history of malignant neoplasm of trachea, bronchus and lung: Secondary | ICD-10-CM

## 2019-06-01 DIAGNOSIS — Z800 Family history of malignant neoplasm of digestive organs: Secondary | ICD-10-CM

## 2019-06-01 DIAGNOSIS — Z85038 Personal history of other malignant neoplasm of large intestine: Secondary | ICD-10-CM | POA: Diagnosis not present

## 2019-06-01 NOTE — Assessment & Plan Note (Signed)
05/20/2019:Palpable right breast mass superficial, ultrasound 11 o'clock position 1.7 cm: Biopsy revealed grade 3 IDC with DCIS ER 100%, PR 30%, Ki-67 20%, HER-2 by IHC negative, axilla lymph node biopsy positive T1CN1A stage IIa clinical stage  Pathology and radiology counseling: Discussed with the patient, the details of pathology including the type of breast cancer,the clinical staging, the significance of ER, PR and HER-2/neu receptors and the implications for treatment. After reviewing the pathology in detail, we proceeded to discuss the different treatment options between surgery, radiation, chemotherapy, antiestrogen therapies.  Recommendation: 1.  Breast conserving surgery with targeted node dissection 2.  MammaPrint testing to determine if she would benefit from chemotherapy 3.  Adjuvant radiation therapy 4.  Follow-up adjuvant antiestrogen therapy  Mammaprint counseling: MINDACT is a prospective, randomized phase III controlled trial that investigates the clinical utility of MammaPrint, when compared to standard clinical pathological criteria, with 6,693 patients enrolled from over 111 institutions. Clinical high-risk patients with a Low Risk MammaPrint result, including 48% node-positive, had 5-year distant metastasis-free survival rate in excess of 94 percent, whether randomized to receive adjuvant chemotherapy or not proving MammaPrint's ability to safely identify Low Risk patients.  Return to clinic after surgery to discuss the final pathology report.

## 2019-06-01 NOTE — Progress Notes (Signed)
REFERRING PROVIDER: Nicholas Lose, MD [address] [place],  [place] [address]  PRIMARY PROVIDER:  [name], MD  PRIMARY REASON FOR VISIT:  1. Malignant neoplasm of upper-outer quadrant of right breast in female, estrogen receptor positive ([place])   2. History of colon cancer   3. Family history of breast cancer   4. Family history of colon cancer   5. Family history of brain cancer      HISTORY OF PRESENT ILLNESS:  I connected with  Wendy [name] on 06/01/2019 at 10 AM EDT by [place] video conference and verified that I am speaking with the correct person using two identifiers.   Patient location: Home Provider location: [facility]  Wendy [name], a 63 y.o. female, was seen for a [place] cancer genetics consultation at the request of Dr. Lindi Adie due to a personal and family history of breast and colon cancer.  Wendy [name] presents to clinic today to discuss the possibility of a hereditary predisposition to cancer, genetic testing, and to further clarify her future cancer risks, as well as potential cancer risks for family members.   In June 2019, at the age of 12, Wendy [name] was diagnosed with a cancerous polyp of the colon. The treatment plan included removal of the polyp, but she did not need treatment for colon cancer.  In November 2020, at the age of 48, Wendy [name] was diagnosed with breast cancer.        CANCER HISTORY:  Oncology History  Malignant neoplasm of upper-outer quadrant of right breast in female, estrogen receptor positive ([place])  05/20/2019 Initial Diagnosis   Palpable right breast mass superficial, ultrasound 11 o'clock position 1.7 cm: Biopsy revealed grade 3 IDC with DCIS ER 100%, PR 30%, Ki-67 20%, HER-2 by IHC negative, axilla lymph node biopsy positive   06/01/2019 Cancer Staging   Staging form: Breast, AJCC 8th Edition - Clinical stage from 06/01/2019: Stage IIA (cT1c, cN1, cM0, G3, ER+, PR+, HER2-) - Signed by Nicholas Lose, MD on 06/01/2019       RISK FACTORS:  Menarche was at age 55.  First live birth at age 83.  Ovaries intact: yes.  Hysterectomy: no.  Menopausal status: postmenopausal.  HRT use: 1 years. Colonoscopy: yes; cancerous polyp identified in June 2020. Mammogram within the last year: yes. Number of breast biopsies: 1. Up to date with pelvic exams: yes. Any excessive radiation exposure in the past: no  Past Medical History:  Diagnosis Date  . Breast cancer ([place])   . Colon cancer ([place])   . DDD (degenerative disc disease), cervical   . DDD (degenerative disc disease), lumbar   . ESOPHAGITIS, REFLUX   . Glaucoma   . HELICOBACTER PYLORI INFECTION   . HEMORRHOIDS, INTERNAL   . History of colon cancer   . History of colon polyps 2019  . Hyperlipidemia   . Radius fracture 2006   Left, Distal  . Thoracic spondylosis   . Tinnitus, bilateral 2013    Past Surgical History:  Procedure Laterality Date  . CESAREAN SECTION  1989  . COLONOSCOPY  2019  . HYSTEROSCOPY W/D&C N/A 09/17/2018   Procedure: DILATATION AND CURETTAGE Marlene Bast;  Surgeon: Brien Few, MD;  Location: [facility];  Service: Gynecology;  Laterality: N/A;  . TONSILLECTOMY     age 38 years old  . WRIST FRACTURE SURGERY  2006    Social History   Socioeconomic History  . Marital status: Significant Other    Spouse name: Not  on file  . Number of children: Not on file  . Years of education: Not on file  . Highest education level: Not on file  Occupational History  . Occupation: Sport and exercise psychologist: [employer]  . Financial resource strain: Not on file  . Food insecurity    Worry: Not on file    Inability: Not on file  . Transportation needs    Medical: Not on file    Non-medical: Not on file  Tobacco Use  . Smoking status: Never Smoker  . Smokeless tobacco: Never Used  Substance and Sexual Activity  . Alcohol use: No  . Drug use: No  . Sexual activity: Not Currently   Lifestyle  . Physical activity    Days per week: Not on file    Minutes per session: Not on file  . Stress: Not on file  Relationships  . Social Herbalist on phone: Not on file    Gets together: Not on file    Attends religious service: Not on file    Active member of club or organization: Not on file    Attends meetings of clubs or organizations: Not on file    Relationship status: Not on file  Other Topics Concern  . Not on file  Social History Narrative  . Not on file     FAMILY HISTORY:  We obtained a detailed, 4-generation family history.  Significant diagnoses are listed below: Family History  Problem Relation Age of Onset  . Diabetes Mother   . Brain cancer Mother        mets  . Cancer Mother        breast-right  . Breast cancer Mother        d. 64  . Diabetes Father   . Hypertension Father   . Cancer Father        lung  . Heart disease Father   . Lung cancer Father   . Diabetes Sister   . Hypertension Sister   . Glaucoma Sister   . Diabetes Brother   . Hypertension Brother   . Heart disease Brother        congestive heart failure  . Other Son 16       car accident  . Colon cancer Maternal Grandmother 18       d. 26  . Dementia Paternal Grandfather   . Breast cancer Other        MGMs mother  . Other Maternal Uncle        possibly drowning  . Brain cancer Niece 1       d. 2 years    The patient had two sons, one who died in a car accident at age 59.  She has two brothers and a sister.  One brother died of heart disease. This brother had a daughter who had a brain tumor at age 102 and died at 33.  Both of her parents are deceased.  The patient's mother had breast cancer and died at 3.  She had one brother who died from possible drowning.  Her mother had colon cancer at 43 and died at 84 and the patient's MGM's mother had breast cancer.  The patient's father died of lung cancer at 74.  He had four sisters and a brother who were cancer free.   His parents died of non-cancer related issues.  Wendy [name] is unaware of previous family history of genetic testing  for hereditary cancer risks. Patient's maternal ancestors are of African American descent, and paternal ancestors are of African American descent. There is no reported Ashkenazi Jewish ancestry. There is no known consanguinity.    GENETIC COUNSELING ASSESSMENT: Wendy [name] is a 63 y.o. female with a personal and family history of breast and colon cancer which is somewhat suggestive of a hereditary cancer syndrome and predisposition to cancer given the number of people in the family with breast and colon cancer. We, therefore, discussed and recommended the following at today's visit.   DISCUSSION: We discussed that 5 - 10% of breast cancer is hereditary, with most cases associated with BRCA mutations.  There are other genes that can be associated with hereditary breast cancer syndromes, and specifically genes associated with breast and colon cancer.  These include the Lynch syndrome genes and CHEK2.  We discussed that testing is beneficial for several reasons including knowing how to follow individuals after completing their treatment, identifying whether potential treatment options such as PARP inhibitors would be beneficial, and understand if other family members could be at risk for cancer and allow them to undergo genetic testing.   We reviewed the characteristics, features and inheritance patterns of hereditary cancer syndromes. We also discussed genetic testing, including the appropriate family members to test, the process of testing, insurance coverage and turn-around-time for results. We discussed the implications of a negative, positive and/or variant of uncertain significant result. In order to get genetic test results in a timely manner so that Wendy [name] can use these genetic test results for surgical decisions, we recommended Wendy [name] pursue genetic testing for the 9-gene STAT panel.  Once complete, we recommend Wendy [name] pursue reflex genetic testing to the common hereditary cancer panel gene panel. The Common Hereditary Gene Panel offered by [facility] includes sequencing and/or deletion duplication testing of the following 48 genes: APC, ATM, AXIN2, BARD1, BMPR1A, BRCA1, BRCA2, BRIP1, CDH1, CDK4, CDKN2A (p14ARF), CDKN2A (p16INK4a), CHEK2, CTNNA1, DICER1, EPCAM (Deletion/duplication testing only), GREM1 (promoter region deletion/duplication testing only), KIT, MEN1, MLH1, MSH2, MSH3, MSH6, MUTYH, NBN, NF1, NHTL1, PALB2, PDGFRA, PMS2, POLD1, POLE, PTEN, RAD50, RAD51C, RAD51D, RNF43, SDHB, SDHC, SDHD, SMAD4, SMARCA4. STK11, TP53, TSC1, TSC2, and VHL.  The following genes were evaluated for sequence changes only: SDHA and HOXB13 c.251G>A variant only.   Based on Wendy [name]'s personal and family history of cancer, she meets medical criteria for genetic testing. Despite that she meets criteria, she may still have an out of pocket cost.   PLAN: After considering the risks, benefits, and limitations, Wendy [name] provided informed consent to pursue genetic testing and the blood sample was sent to [facility] for analysis of the STAT panel and the common hereditary cancer panel. Results should be available within approximately 10-12 days and the remainder of the testing could take up to an additional 2 weeks' time, at which point they will be disclosed by telephone to Wendy [name], as will any additional recommendations warranted by these results. Wendy [name] will receive a summary of her genetic counseling visit and a copy of her results once available. This information will also be available in Epic.   Lastly, we encouraged Wendy [name] to remain in contact with cancer genetics annually so that we can continuously update the family history and inform her of any changes in cancer genetics and testing that may be of benefit for this family.   Wendy [name] questions were answered to her satisfaction  today. Our contact information was provided should additional  questions or concerns arise. Thank you for the referral and allowing [place] to share in the care of your patient.   Kodie Kishi P. [place]ne [place], [place], [facility] Licensed, Insurance risk surveyor [email or web address] phone: [phone]  The patient was seen for a total of 45 minutes in face-to-face genetic counseling.  This patient was discussed with Drs. Magrinat, Lindi Adie and/or Burr Medico who agrees with the above.    _______________________________________________________________________ For Office Staff:  Number of people involved in session: 1 Was an Intern/ student involved with case: no

## 2019-06-03 ENCOUNTER — Telehealth: Payer: Self-pay | Admitting: *Deleted

## 2019-06-03 LAB — GENETIC SCREENING ORDER

## 2019-06-03 NOTE — Telephone Encounter (Signed)
Left message to assess navigation needs.  Left Contact information on her voicemail.

## 2019-06-07 ENCOUNTER — Ambulatory Visit: Payer: Self-pay | Admitting: General Surgery

## 2019-06-07 DIAGNOSIS — Z170 Estrogen receptor positive status [ER+]: Secondary | ICD-10-CM

## 2019-06-07 DIAGNOSIS — C50411 Malignant neoplasm of upper-outer quadrant of right female breast: Secondary | ICD-10-CM

## 2019-06-10 ENCOUNTER — Encounter: Payer: Self-pay | Admitting: Genetic Counselor

## 2019-06-10 ENCOUNTER — Encounter: Payer: Self-pay | Admitting: *Deleted

## 2019-06-10 ENCOUNTER — Other Ambulatory Visit: Payer: Self-pay | Admitting: General Surgery

## 2019-06-10 DIAGNOSIS — Z1379 Encounter for other screening for genetic and chromosomal anomalies: Secondary | ICD-10-CM | POA: Insufficient documentation

## 2019-06-10 DIAGNOSIS — C50411 Malignant neoplasm of upper-outer quadrant of right female breast: Secondary | ICD-10-CM

## 2019-06-10 DIAGNOSIS — Z170 Estrogen receptor positive status [ER+]: Secondary | ICD-10-CM

## 2019-06-13 ENCOUNTER — Telehealth: Payer: Self-pay | Admitting: Genetic Counselor

## 2019-06-13 NOTE — Telephone Encounter (Signed)
Revealed negative testing on the 9-gene STAT panel.  Discussed that the remainder of the panel will let us know if there are other concerns, but this testing indicates that the genes associated with surgical changes were negative.  Therefore, her surgery will not be determined based on this testing.

## 2019-06-14 ENCOUNTER — Other Ambulatory Visit (HOSPITAL_COMMUNITY): Payer: 59

## 2019-06-15 ENCOUNTER — Telehealth: Payer: Self-pay | Admitting: Hematology and Oncology

## 2019-06-15 NOTE — Telephone Encounter (Signed)
Called per 12/8 sch message - pt is aware of appt date and time .

## 2019-06-16 ENCOUNTER — Other Ambulatory Visit: Payer: 59

## 2019-06-21 ENCOUNTER — Encounter (HOSPITAL_BASED_OUTPATIENT_CLINIC_OR_DEPARTMENT_OTHER): Payer: Self-pay | Admitting: General Surgery

## 2019-06-21 ENCOUNTER — Other Ambulatory Visit: Payer: Self-pay

## 2019-06-23 ENCOUNTER — Other Ambulatory Visit (HOSPITAL_COMMUNITY)
Admission: RE | Admit: 2019-06-23 | Discharge: 2019-06-23 | Disposition: A | Discharge status: Home / Self Care | Payer: 59 | Source: Ambulatory Visit | Attending: General Surgery | Admitting: General Surgery

## 2019-06-23 DIAGNOSIS — Z01812 Encounter for preprocedural laboratory examination: Secondary | ICD-10-CM | POA: Insufficient documentation

## 2019-06-23 DIAGNOSIS — Z20828 Contact with and (suspected) exposure to other viral communicable diseases: Secondary | ICD-10-CM | POA: Insufficient documentation

## 2019-06-23 LAB — SARS CORONAVIRUS 2 (TAT 6-24 HRS): SARS Coronavirus 2: NEGATIVE

## 2019-06-23 NOTE — Progress Notes (Signed)
      Enhanced Recovery after Surgery for Orthopedics Enhanced Recovery after Surgery is a protocol used to improve the stress on your body and your recovery after surgery.  Patient Instructions  . The night before surgery:  o No food after midnight. ONLY clear liquids after midnight  . The day of surgery (if you do NOT have diabetes):  o Drink ONE (1) Pre-Surgery Clear Ensure as directed.   o This drink was given to you during your hospital  pre-op appointment visit. o The pre-op nurse will instruct you on the time to drink the  Pre-Surgery Ensure depending on your surgery time. o Finish the drink at the designated time by the pre-op nurse.  o Nothing else to drink after completing the  Pre-Surgery Clear Ensure.  . The day of surgery (if you have diabetes): o Drink ONE (1) Gatorade 2 (G2) as directed. o This drink was given to you during your hospital  pre-op appointment visit.  o The pre-op nurse will instruct you on the time to drink the   Gatorade 2 (G2) depending on your surgery time. o Color of the Gatorade may vary. Red is not allowed. o Nothing else to drink after completing the  Gatorade 2 (G2).         If you have questions, please contact your surgeon's office.  Finish drink by 1030. Use hibiclens night before and morning of surgery. No lotions creams or deotarents

## 2019-06-27 ENCOUNTER — Other Ambulatory Visit: Payer: Self-pay

## 2019-06-27 ENCOUNTER — Ambulatory Visit
Admission: RE | Admit: 2019-06-27 | Discharge: 2019-06-27 | Disposition: A | Discharge status: Home / Self Care | Payer: 59 | Source: Ambulatory Visit | Attending: General Surgery | Admitting: General Surgery

## 2019-06-27 ENCOUNTER — Other Ambulatory Visit: Payer: Self-pay | Admitting: General Surgery

## 2019-06-27 ENCOUNTER — Ambulatory Visit (HOSPITAL_BASED_OUTPATIENT_CLINIC_OR_DEPARTMENT_OTHER): Payer: No Typology Code available for payment source | Admitting: Certified Registered Nurse Anesthetist

## 2019-06-27 ENCOUNTER — Encounter (HOSPITAL_BASED_OUTPATIENT_CLINIC_OR_DEPARTMENT_OTHER)
Admission: RE | Disposition: A | Discharge status: Home / Self Care | Payer: Self-pay | Source: Home / Self Care | Attending: General Surgery

## 2019-06-27 ENCOUNTER — Encounter (HOSPITAL_COMMUNITY)
Admission: RE | Admit: 2019-06-27 | Discharge: 2019-06-27 | Disposition: A | Discharge status: Home / Self Care | Payer: No Typology Code available for payment source | Source: Ambulatory Visit | Attending: General Surgery | Admitting: General Surgery

## 2019-06-27 ENCOUNTER — Encounter (HOSPITAL_BASED_OUTPATIENT_CLINIC_OR_DEPARTMENT_OTHER): Payer: Self-pay | Admitting: General Surgery

## 2019-06-27 ENCOUNTER — Encounter: Payer: Self-pay | Admitting: Hematology and Oncology

## 2019-06-27 ENCOUNTER — Ambulatory Visit (HOSPITAL_BASED_OUTPATIENT_CLINIC_OR_DEPARTMENT_OTHER)
Admission: RE | Admit: 2019-06-27 | Discharge: 2019-06-27 | Disposition: A | Discharge status: Home / Self Care | Payer: No Typology Code available for payment source | Attending: General Surgery | Admitting: General Surgery

## 2019-06-27 DIAGNOSIS — Z886 Allergy status to analgesic agent status: Secondary | ICD-10-CM | POA: Diagnosis not present

## 2019-06-27 DIAGNOSIS — Z170 Estrogen receptor positive status [ER+]: Secondary | ICD-10-CM

## 2019-06-27 DIAGNOSIS — C50411 Malignant neoplasm of upper-outer quadrant of right female breast: Secondary | ICD-10-CM

## 2019-06-27 DIAGNOSIS — H409 Unspecified glaucoma: Secondary | ICD-10-CM | POA: Diagnosis not present

## 2019-06-27 DIAGNOSIS — Z885 Allergy status to narcotic agent status: Secondary | ICD-10-CM | POA: Insufficient documentation

## 2019-06-27 DIAGNOSIS — E669 Obesity, unspecified: Secondary | ICD-10-CM | POA: Insufficient documentation

## 2019-06-27 DIAGNOSIS — Z6838 Body mass index (BMI) 38.0-38.9, adult: Secondary | ICD-10-CM | POA: Insufficient documentation

## 2019-06-27 DIAGNOSIS — M1990 Unspecified osteoarthritis, unspecified site: Secondary | ICD-10-CM | POA: Diagnosis not present

## 2019-06-27 DIAGNOSIS — C50911 Malignant neoplasm of unspecified site of right female breast: Secondary | ICD-10-CM | POA: Diagnosis present

## 2019-06-27 DIAGNOSIS — C773 Secondary and unspecified malignant neoplasm of axilla and upper limb lymph nodes: Secondary | ICD-10-CM | POA: Insufficient documentation

## 2019-06-27 DIAGNOSIS — Z803 Family history of malignant neoplasm of breast: Secondary | ICD-10-CM | POA: Diagnosis not present

## 2019-06-27 HISTORY — PX: BREAST LUMPECTOMY WITH RADIOACTIVE SEED AND SENTINEL LYMPH NODE BIOPSY: SHX6550

## 2019-06-27 SURGERY — BREAST LUMPECTOMY WITH RADIOACTIVE SEED AND SENTINEL LYMPH NODE BIOPSY
Anesthesia: General | Laterality: Right

## 2019-06-27 MED ORDER — LIDOCAINE 2% (20 MG/ML) 5 ML SYRINGE
INTRAMUSCULAR | Status: AC
  Filled 2019-06-27: qty 5

## 2019-06-27 MED ORDER — FENTANYL CITRATE (PF) 100 MCG/2ML IJ SOLN
INTRAMUSCULAR | Status: DC | PRN
  Administered 2019-06-27 (×4): 25 ug via INTRAVENOUS

## 2019-06-27 MED ORDER — MIDAZOLAM HCL 2 MG/2ML IJ SOLN
INTRAMUSCULAR | Status: AC
  Filled 2019-06-27: qty 2

## 2019-06-27 MED ORDER — SODIUM CHLORIDE (PF) 0.9 % IJ SOLN
INTRAMUSCULAR | Status: AC
  Filled 2019-06-27: qty 10

## 2019-06-27 MED ORDER — PROPOFOL 10 MG/ML IV BOLUS
INTRAVENOUS | Status: DC | PRN
  Administered 2019-06-27: 200 mg via INTRAVENOUS
  Administered 2019-06-27: 20 mg via INTRAVENOUS

## 2019-06-27 MED ORDER — CELECOXIB 200 MG PO CAPS
ORAL_CAPSULE | ORAL | Status: AC
  Filled 2019-06-27: qty 1

## 2019-06-27 MED ORDER — CHLORHEXIDINE GLUCONATE CLOTH 2 % EX PADS: 6.0000 | MEDICATED_PAD | Freq: Once | CUTANEOUS | Status: DC

## 2019-06-27 MED ORDER — PHENYLEPHRINE 40 MCG/ML (10ML) SYRINGE FOR IV PUSH (FOR BLOOD PRESSURE SUPPORT)
PREFILLED_SYRINGE | INTRAVENOUS | Status: AC
  Filled 2019-06-27: qty 10

## 2019-06-27 MED ORDER — PHENYLEPHRINE 40 MCG/ML (10ML) SYRINGE FOR IV PUSH (FOR BLOOD PRESSURE SUPPORT)
PREFILLED_SYRINGE | INTRAVENOUS | Status: DC | PRN
  Administered 2019-06-27: 80 ug via INTRAVENOUS
  Administered 2019-06-27: 120 ug via INTRAVENOUS

## 2019-06-27 MED ORDER — ACETAMINOPHEN 10 MG/ML IV SOLN
INTRAVENOUS | Status: AC
  Filled 2019-06-27: qty 100

## 2019-06-27 MED ORDER — ONDANSETRON HCL 4 MG/2ML IJ SOLN
INTRAMUSCULAR | Status: AC
  Filled 2019-06-27: qty 2

## 2019-06-27 MED ORDER — BUPIVACAINE HCL 0.5 % IJ SOLN
INTRAMUSCULAR | Status: DC | PRN
  Administered 2019-06-27: 15 mL

## 2019-06-27 MED ORDER — DEXAMETHASONE SODIUM PHOSPHATE 4 MG/ML IJ SOLN
INTRAMUSCULAR | Status: DC | PRN
  Administered 2019-06-27: 10 mg via INTRAVENOUS

## 2019-06-27 MED ORDER — METHYLENE BLUE 0.5 % INJ SOLN
INTRAVENOUS | Status: AC
  Filled 2019-06-27: qty 10

## 2019-06-27 MED ORDER — FENTANYL CITRATE (PF) 100 MCG/2ML IJ SOLN
INTRAMUSCULAR | Status: AC
  Filled 2019-06-27: qty 2

## 2019-06-27 MED ORDER — SODIUM CHLORIDE (PF) 0.9 % IJ SOLN
INTRAVENOUS | Status: DC | PRN
  Administered 2019-06-27: 13:00:00 5 mL via INTRADERMAL

## 2019-06-27 MED ORDER — TRAMADOL HCL 50 MG PO TABS: 50.0000 mg | ORAL_TABLET | Freq: Four times a day (QID) | ORAL | 1 refills | Status: DC | PRN

## 2019-06-27 MED ORDER — BUPIVACAINE LIPOSOME 1.3 % IJ SUSP
INTRAMUSCULAR | Status: DC | PRN
  Administered 2019-06-27: 10 mL via PERINEURAL

## 2019-06-27 MED ORDER — CEFAZOLIN SODIUM-DEXTROSE 2-4 GM/100ML-% IV SOLN
2.0000 g | INTRAVENOUS | Status: AC
  Administered 2019-06-27: 2 g via INTRAVENOUS

## 2019-06-27 MED ORDER — ONDANSETRON HCL 4 MG/2ML IJ SOLN: 4.0000 mg | Freq: Once | INTRAMUSCULAR | Status: DC | PRN

## 2019-06-27 MED ORDER — PROMETHAZINE HCL 25 MG/ML IJ SOLN
INTRAMUSCULAR | Status: AC
  Filled 2019-06-27: qty 1

## 2019-06-27 MED ORDER — FENTANYL CITRATE (PF) 100 MCG/2ML IJ SOLN
50.0000 ug | INTRAMUSCULAR | Status: DC | PRN
  Administered 2019-06-27 (×2): 50 ug via INTRAVENOUS

## 2019-06-27 MED ORDER — ONDANSETRON HCL 4 MG/2ML IJ SOLN
INTRAMUSCULAR | Status: DC | PRN
  Administered 2019-06-27: 4 mg via INTRAVENOUS

## 2019-06-27 MED ORDER — PROPOFOL 500 MG/50ML IV EMUL
INTRAVENOUS | Status: AC
  Filled 2019-06-27: qty 100

## 2019-06-27 MED ORDER — MIDAZOLAM HCL 2 MG/2ML IJ SOLN
1.0000 mg | INTRAMUSCULAR | Status: DC | PRN
  Administered 2019-06-27: 2 mg via INTRAVENOUS

## 2019-06-27 MED ORDER — ACETAMINOPHEN 10 MG/ML IV SOLN
1000.0000 mg | Freq: Once | INTRAVENOUS | Status: AC
  Administered 2019-06-27: 1000 mg via INTRAVENOUS

## 2019-06-27 MED ORDER — CELECOXIB 200 MG PO CAPS
200.0000 mg | ORAL_CAPSULE | ORAL | Status: AC
  Administered 2019-06-27: 200 mg via ORAL

## 2019-06-27 MED ORDER — LACTATED RINGERS IV SOLN: INTRAVENOUS | Status: DC

## 2019-06-27 MED ORDER — FENTANYL CITRATE (PF) 100 MCG/2ML IJ SOLN
25.0000 ug | INTRAMUSCULAR | Status: DC | PRN
  Administered 2019-06-27 (×2): 50 ug via INTRAVENOUS

## 2019-06-27 MED ORDER — GABAPENTIN 300 MG PO CAPS
300.0000 mg | ORAL_CAPSULE | ORAL | Status: AC
  Administered 2019-06-27: 12:00:00 300 mg via ORAL

## 2019-06-27 MED ORDER — SUCCINYLCHOLINE CHLORIDE 200 MG/10ML IV SOSY
PREFILLED_SYRINGE | INTRAVENOUS | Status: AC
  Filled 2019-06-27: qty 10

## 2019-06-27 MED ORDER — EPHEDRINE 5 MG/ML INJ
INTRAVENOUS | Status: AC
  Filled 2019-06-27: qty 10

## 2019-06-27 MED ORDER — OXYCODONE HCL 5 MG PO TABS
5.0000 mg | ORAL_TABLET | Freq: Once | ORAL | Status: AC
  Administered 2019-06-27: 5 mg via ORAL

## 2019-06-27 MED ORDER — OXYCODONE HCL 5 MG PO TABS
ORAL_TABLET | ORAL | Status: AC
  Filled 2019-06-27: qty 1

## 2019-06-27 MED ORDER — CEFAZOLIN SODIUM-DEXTROSE 2-4 GM/100ML-% IV SOLN
INTRAVENOUS | Status: AC
  Filled 2019-06-27: qty 100

## 2019-06-27 MED ORDER — GABAPENTIN 300 MG PO CAPS
ORAL_CAPSULE | ORAL | Status: AC
  Filled 2019-06-27: qty 1

## 2019-06-27 MED ORDER — TECHNETIUM TC 99M SULFUR COLLOID FILTERED
1.0000 | Freq: Once | INTRAVENOUS | Status: AC | PRN
  Administered 2019-06-27: 1 via INTRADERMAL

## 2019-06-27 MED ORDER — PROMETHAZINE HCL 25 MG/ML IJ SOLN
12.5000 mg | Freq: Four times a day (QID) | INTRAMUSCULAR | Status: DC | PRN
  Administered 2019-06-27: 12.5 mg via INTRAVENOUS

## 2019-06-27 MED ORDER — BUPIVACAINE HCL (PF) 0.25 % IJ SOLN
INTRAMUSCULAR | Status: DC | PRN
  Administered 2019-06-27: 20 mL

## 2019-06-27 MED ORDER — MEPERIDINE HCL 25 MG/ML IJ SOLN: 6.2500 mg | INTRAMUSCULAR | Status: DC | PRN

## 2019-06-27 SURGICAL SUPPLY — 44 items
APPLIER CLIP 9.375 MED OPEN (MISCELLANEOUS) ×4
BLADE SURG 15 STRL LF DISP TIS (BLADE) ×1 IMPLANT
BLADE SURG 15 STRL SS (BLADE) ×1
CANISTER SUC SOCK COL 7IN (MISCELLANEOUS) IMPLANT
CANISTER SUCT 1200ML W/VALVE (MISCELLANEOUS) ×2 IMPLANT
CHLORAPREP W/TINT 26 (MISCELLANEOUS) ×2 IMPLANT
CLIP APPLIE 9.375 MED OPEN (MISCELLANEOUS) ×2 IMPLANT
COVER BACK TABLE REUSABLE LG (DRAPES) ×2 IMPLANT
COVER MAYO STAND REUSABLE (DRAPES) ×2 IMPLANT
COVER PROBE W GEL 5X96 (DRAPES) ×2 IMPLANT
COVER WAND RF STERILE (DRAPES) IMPLANT
DECANTER SPIKE VIAL GLASS SM (MISCELLANEOUS) IMPLANT
DERMABOND ADVANCED (GAUZE/BANDAGES/DRESSINGS) ×1
DERMABOND ADVANCED .7 DNX12 (GAUZE/BANDAGES/DRESSINGS) ×1 IMPLANT
DRAPE LAPAROSCOPIC ABDOMINAL (DRAPES) ×2 IMPLANT
DRAPE UTILITY XL STRL (DRAPES) ×2 IMPLANT
ELECT COATED BLADE 2.86 ST (ELECTRODE) ×2 IMPLANT
ELECT REM PT RETURN 9FT ADLT (ELECTROSURGICAL) ×2
ELECTRODE REM PT RTRN 9FT ADLT (ELECTROSURGICAL) ×1 IMPLANT
GLOVE BIO SURGEON STRL SZ7 (GLOVE) ×2 IMPLANT
GLOVE BIO SURGEON STRL SZ7.5 (GLOVE) ×6 IMPLANT
GLOVE BIOGEL PI IND STRL 7.0 (GLOVE) ×1 IMPLANT
GLOVE BIOGEL PI INDICATOR 7.0 (GLOVE) ×1
GOWN STRL REUS W/ TWL LRG LVL3 (GOWN DISPOSABLE) ×2 IMPLANT
GOWN STRL REUS W/TWL LRG LVL3 (GOWN DISPOSABLE) ×2
ILLUMINATOR WAVEGUIDE N/F (MISCELLANEOUS) IMPLANT
KIT MARKER MARGIN INK (KITS) ×2 IMPLANT
LIGHT WAVEGUIDE WIDE FLAT (MISCELLANEOUS) IMPLANT
NDL SAFETY ECLIPSE 18X1.5 (NEEDLE) IMPLANT
NEEDLE HYPO 18GX1.5 SHARP (NEEDLE)
NEEDLE HYPO 25X1 1.5 SAFETY (NEEDLE) ×4 IMPLANT
NS IRRIG 1000ML POUR BTL (IV SOLUTION) IMPLANT
PACK BASIN DAY SURGERY FS (CUSTOM PROCEDURE TRAY) ×2 IMPLANT
PENCIL SMOKE EVACUATOR (MISCELLANEOUS) ×2 IMPLANT
SLEEVE SCD COMPRESS KNEE MED (MISCELLANEOUS) ×2 IMPLANT
SPONGE LAP 18X18 RF (DISPOSABLE) ×2 IMPLANT
SUT MON AB 4-0 PC3 18 (SUTURE) ×4 IMPLANT
SUT SILK 2 0 SH (SUTURE) IMPLANT
SUT VICRYL 3-0 CR8 SH (SUTURE) ×2 IMPLANT
SYR CONTROL 10ML LL (SYRINGE) ×4 IMPLANT
TOWEL GREEN STERILE FF (TOWEL DISPOSABLE) ×2 IMPLANT
TRAY FAXITRON CT DISP (TRAY / TRAY PROCEDURE) ×2 IMPLANT
TUBE CONNECTING 20X1/4 (TUBING) ×2 IMPLANT
YANKAUER SUCT BULB TIP NO VENT (SUCTIONS) ×2 IMPLANT

## 2019-06-27 NOTE — Anesthesia Preprocedure Evaluation (Signed)
Anesthesia Evaluation  Patient identified by MRN, date of birth, ID band Patient awake    Reviewed: Allergy & Precautions, NPO status , Patient's Chart, lab work & pertinent test results  Airway Mallampati: IV  TM Distance: >3 FB Neck ROM: Full    Dental no notable dental hx. (+) Teeth Intact   Pulmonary neg pulmonary ROS,    Pulmonary exam normal breath sounds clear to auscultation       Cardiovascular negative cardio ROS Normal cardiovascular exam Rhythm:Regular Rate:Normal     Neuro/Psych Glaucoma negative neurological ROS  negative psych ROS   GI/Hepatic Neg liver ROS, GERD  Medicated and Controlled,Hx/o Colon Ca   Endo/Other  Obesity Right breast Ca  Renal/GU negative Renal ROS  negative genitourinary   Musculoskeletal  (+) Arthritis , Osteoarthritis,    Abdominal (+) + obese,   Peds  Hematology negative hematology ROS (+)   Anesthesia Other Findings   Reproductive/Obstetrics                             Anesthesia Physical Anesthesia Plan  ASA: III  Anesthesia Plan: General   Post-op Pain Management:    Induction: Intravenous  PONV Risk Score and Plan: Treatment may vary due to age or medical condition, Ondansetron and Propofol infusion  Airway Management Planned: Oral ETT  Additional Equipment:   Intra-op Plan:   Post-operative Plan: Extubation in OR  Informed Consent: I have reviewed the patients History and Physical, chart, labs and discussed the procedure including the risks, benefits and alternatives for the proposed anesthesia with the patient or authorized representative who has indicated his/her understanding and acceptance.     Dental advisory given  Plan Discussed with: CRNA and Surgeon  Anesthesia Plan Comments:         Anesthesia Quick Evaluation

## 2019-06-27 NOTE — H&P (Signed)
Wendy [name]  Location: [facility] Surgery Patient #: [id number] DOB: 1956-01-22 Divorced / Language: English / Race: Black or African American Female   History of Present Illness The patient is a 63 year old female who presents with breast cancer. We are asked to see the patient in consultation by Dr. Audria Nine to evaluate her for a new right breast cancer. The patient is a 63 year old black female who first developed a lump in the upper outer right breast about one month ago. She brought this to her medical doctors tension. She was worked up with mammogram and ultrasound and found to have a 1.8 cm mass in the upper outer quadrant of the right breast and one abnormal looking lymph node. Both of these were biopsied and both came back as an invasive breast cancer that was ER and PR positive and HER-2 negative with a Ki-67 of 30%. She is otherwise healthy and does not smoke. She does not take estrogen replacement. She does have a family history of breast cancer in her mother and great-grandmother and colon cancer in her grandmother and lung cancer in her father.   Past Surgical History  Breast Biopsy  Right. Cesarean Section - 1  Colon Polyp Removal - Colonoscopy  Tonsillectomy   Diagnostic Studies History Mammogram  1-3 years ago  Allergies  Vicodin ES *ANALGESICS - OPIOID*   Medication History  Latanoprost (0.005% Solution, Ophthalmic) Active. Medications Reconciled  Social History Alcohol use  Occasional alcohol use. No caffeine use  No drug use  Tobacco use  Never smoker.  Family History  Breast Cancer  Mother. Cancer  Father, Mother. Cerebrovascular Accident  Father. Diabetes Mellitus  Brother, Father, Mother, Sister. Heart Disease  Brother, Father, Sister. Heart disease in female family member before age 67  Hypertension  Brother, Father. Ischemic Bowel Disease  Son. Respiratory Condition  Brother.  Pregnancy / Birth History Age at  menarche  38 years. Age of menopause  36-60 Contraceptive History  Oral contraceptives. Gravida  3 Length (months) of breastfeeding  3-6 Maternal age  53-30 Para  2  Other Problems  Colon Cancer  Hypercholesterolemia  Lump In Breast     Review of Systems  General Not Present- Appetite Loss, Chills, Fatigue, Fever, Night Sweats, Weight Gain and Weight Loss. Note: All other systems negative (unless as noted in HPI & included Review of Systems) Skin Not Present- Change in Wart/Mole, Dryness, Hives, Jaundice, New Lesions, Non-Healing Wounds, Rash and Ulcer. HEENT Not Present- Earache, Hearing Loss, Hoarseness, Nose Bleed, Oral Ulcers, Ringing in the Ears, Seasonal Allergies, Sinus Pain, Sore Throat, Visual Disturbances, Wears glasses/contact lenses and Yellow Eyes. Respiratory Not Present- Bloody sputum, Chronic Cough, Difficulty Breathing, Snoring and Wheezing. Breast Not Present- Breast Mass, Breast Pain, Nipple Discharge and Skin Changes. Cardiovascular Not Present- Chest Pain, Difficulty Breathing Lying Down, Leg Cramps, Palpitations, Rapid Heart Rate, Shortness of Breath and Swelling of Extremities. Gastrointestinal Not Present- Abdominal Pain, Bloating, Bloody Stool, Change in Bowel Habits, Chronic diarrhea, Constipation, Difficulty Swallowing, Excessive gas, Gets full quickly at meals, Hemorrhoids, Indigestion, Nausea, Rectal Pain and Vomiting. Female Genitourinary Not Present- Frequency, Nocturia, Painful Urination, Pelvic Pain and Urgency. Musculoskeletal Not Present- Back Pain, Joint Pain, Joint Stiffness, Muscle Pain, Muscle Weakness and Swelling of Extremities. Neurological Not Present- Decreased Memory, Fainting, Headaches, Numbness, Seizures, Tingling, Tremor, Trouble walking and Weakness. Psychiatric Not Present- Anxiety, Bipolar, Change in Sleep Pattern, Depression, Fearful and Frequent crying. Endocrine Not Present- Cold Intolerance, Excessive Hunger, Hair Changes,  Heat Intolerance,  Hot flashes and New Diabetes. Hematology Not Present- Easy Bruising, Excessive bleeding, Gland problems, HIV and Persistent Infections.  Vitals Weight: 218.2 lb Height: 63in Body Surface Area: 2.01 m Body Mass Index: 38.65 kg/m  Temp.: 70F(Tympanic)  Pulse: 81 (Regular)  BP: 126/72 (Sitting, Left Arm, Standard)       Physical Exam General Mental Status-Alert. General Appearance-Consistent with stated age. Hydration-Well hydrated. Voice-Normal.  Head and Neck Head-normocephalic, atraumatic with no lesions or palpable masses. Trachea-midline. Thyroid Gland Characteristics - normal size and consistency.  Eye Eyeball - Bilateral-Extraocular movements intact. Sclera/Conjunctiva - Bilateral-No scleral icterus.  Chest and Lung Exam Chest and lung exam reveals -quiet, even and easy respiratory effort with no use of accessory muscles and on auscultation, normal breath sounds, no adventitious sounds and normal vocal resonance. Inspection Chest Wall - Normal. Back - normal.  Breast Note: There is a palpable fullness with indistinct margins in the upper outer right breast near the areola. There is no palpable mass in the left breast. There is no palpable axillary, supraclavicular, or cervical lymphadenopathy.   Cardiovascular Cardiovascular examination reveals -normal heart sounds, regular rate and rhythm with no murmurs and normal pedal pulses bilaterally.  Abdomen Inspection Inspection of the abdomen reveals - No Hernias. Skin - Scar - no surgical scars. Palpation/Percussion Palpation and Percussion of the abdomen reveal - Soft, Non Tender, No Rebound tenderness, No Rigidity (guarding) and No hepatosplenomegaly. Auscultation Auscultation of the abdomen reveals - Bowel sounds normal.  Neurologic Neurologic evaluation reveals -alert and oriented x 3 with no impairment of recent or remote memory. Mental  Status-Normal.  Musculoskeletal Normal Exam - Left-Upper Extremity Strength Normal and Lower Extremity Strength Normal. Normal Exam - Right-Upper Extremity Strength Normal and Lower Extremity Strength Normal.  Lymphatic Head & Neck  General Head & Neck Lymphatics: Bilateral - Description - Normal. Axillary  General Axillary Region: Bilateral - Description - Normal. Tenderness - Non Tender. Femoral & Inguinal  Generalized Femoral & Inguinal Lymphatics: Bilateral - Description - Normal. Tenderness - Non Tender.    Assessment & Plan MALIGNANT NEOPLASM OF UPPER-OUTER QUADRANT OF RIGHT BREAST IN FEMALE, ESTROGEN RECEPTOR POSITIVE (C50.411) Impression: The patient appears to have a 1.8 cm cancer in the upper outer quadrant of the right breast with 1 positive lymph node. I have talked to her in detail about the different options for treatment and at this point I do think she would be a good breast conservation candidate. With a positive node the question would be whether she would benefit from neoadjuvant chemotherapy or not. Since that is the only positive node that is seen at think she would be a candidate for targeted node dissection either way. I will go ahead and make her referral to medical oncology so that they can talk to her about these issues and help decide whether she would benefit from chemotherapy and whether it should be neoadjuvant or adjuvant. I will also refer her to genetics given her family history. If the workup is going to take some time she may also be more comfortable with an antiestrogen until the workup is done. We will talk with the medical oncologist about this. I will talk to her again after her consultation with oncology and we will then proceed accordingly Current Plans Referred to Oncology, for evaluation and follow up (Oncology). Routine. Referred to Genetic Counseling, for evaluation and follow up [employer]). Routine.

## 2019-06-27 NOTE — Op Note (Signed)
06/27/2019  2:32 PM  PATIENT:  Wendy [name]  63 y.o. female  PRE-OPERATIVE DIAGNOSIS:  RIGHT BREAST CANCER  POST-OPERATIVE DIAGNOSIS:  RIGHT BREAST CANCER  PROCEDURE:  Procedure(s): RIGHT BREAST LUMPECTOMY WITH RADIOACTIVE SEED LOCALIZATION AND DEEP RIGHT AXILLARY SENTINEL LYMPH NODE BIOPSY WITH INJECTION BLUE DYE AND RIGHT TARGETED NODE DISSECTION (Right)  SURGEON:  Surgeon(s) and Role:    * Jovita Kussmaul, MD - Primary  PHYSICIAN ASSISTANT:   ASSISTANTS: none   ANESTHESIA:   local and general  EBL:  20 mL   BLOOD ADMINISTERED:none  DRAINS: none   LOCAL MEDICATIONS USED:  MARCAINE     SPECIMEN:  Source of Specimen:  right breast tissue with targeted node and sentinel node x 3  DISPOSITION OF SPECIMEN:  PATHOLOGY  COUNTS:  YES  TOURNIQUET:  * No tourniquets in log *  DICTATION: .Dragon Dictation   After informed consent was obtained the patient was brought to the operating room and placed in the supine position on the operating table.  After adequate induction of general anesthesia the patient's right chest, breast, and axillary area were prepped with ChloraPrep, allowed to dry, and draped in usual sterile manner.  An appropriate timeout was performed.  Earlier in the day the patient underwent injection of 1 mCi of technetium sulfur colloid in the subareolar position on the right.  I also injected 2 cc of methylene blue and 3 cc of injectable saline in the same places in the subareolar right breast.  Previously an I-125 seed was placed in the upper outer quadrant of the right breast to mark an area of invasive cancer.  Previously an I-125 seed was also placed in the right axilla to target a previously biopsied positive node.  Attention was first turned to the right axilla.  The neoprobe was set to I-125 in the area of radioactivity was readily identified.  The area was infiltrated with quarter percent Marcaine.  A transversely oriented incision was made overlying the area  of radioactivity with a 15 blade knife.  The incision was carried through the skin and subcutaneous tissue sharply with the electrocautery until the deep right axillary space was entered.  Dissection was then carried out towards the targeted node under the direction of the neoprobe.  The targeted node was excised sharply with the electrocautery and the surrounding lymphatics and small vessels were controlled with clips.  This lymph node also had technetium signal as well as blue dye.  This was sent as targeted node.  The neoprobe was then set to technetium.  I was able to identify 3 lymph nodes with increased radioactivity.  These were excised sharply with the electrocautery and the surrounding lymphatics and vessels were controlled with clips.  Ex vivo counts on these nodes were all approximately 50 counts.  These were sent as sentinel nodes numbers 1 through 3.  No other hot or palpable or blue lymph nodes were identified in the right axilla.  Hemostasis was achieved using the Bovie electrocautery.  The deep layer of the wound was then closed with interrupted 3-0 Vicryl stitches.  The skin was then closed with a running 4-0 Monocryl subcuticular stitch.  Attention was then turned to the right breast.  The neoprobe was set to I-125 and the area of radioactivity was readily identified superficially in the upper outer quadrant of the right breast.  Because of the superficial position of the seed I elected to make an elliptical incision in the skin overlying the area of  radioactivity with a 15 blade knife.  The incision was carried through the skin and subcutaneous tissue sharply with electrocautery.  Dissection was then carried out around the radioactive seed while checking the area of radioactivity frequently.  Once the dissection was complete and the specimen was removed it was oriented with the appropriate paint colors.  A specimen radiograph was obtained that showed the clip and seed to be near the center of the  specimen.  The specimen was then sent to pathology for further evaluation.  Hemostasis was achieved using the Bovie electrocautery.  The wound was irrigated with saline and infiltrated with more quarter percent Marcaine.  The cavity was marked with clips.  The deep layer of the wound was then closed with layers of interrupted 3-0 Vicryl stitches.  The skin was closed with a running 4-0 Monocryl subcuticular stitch.  Dermabond dressings were applied.  The patient tolerated the procedure well.  At the end of the case all needle sponge and instrument counts were correct.  The patient was then awakened and taken to recovery in stable condition.  PLAN OF CARE: Discharge to home after PACU  PATIENT DISPOSITION:  PACU - hemodynamically stable.   Delay start of Pharmacological VTE agent (>24hrs) due to surgical blood loss or risk of bleeding: not applicable

## 2019-06-27 NOTE — Discharge Instructions (Signed)
No Tylenol until 9:30pm, no ibuprofen until 8:30pm 5mg  oxycodone given at 4:15pm, next dose tramadol may be given at 10:15pm    Post Anesthesia Home Care Instructions  Activity: Get plenty of rest for the remainder of the day. A responsible individual must stay with you for 24 hours following the procedure.  For the next 24 hours, DO NOT: -Drive a car -Paediatric nurse -Drink alcoholic beverages -Take any medication unless instructed by your physician -Make any legal decisions or sign important papers.  Meals: Start with liquid foods such as gelatin or soup. Progress to regular foods as tolerated. Avoid greasy, spicy, heavy foods. If nausea and/or vomiting occur, drink only clear liquids until the nausea and/or vomiting subsides. Call your physician if vomiting continues.  Special Instructions/Symptoms: Your throat may feel dry or sore from the anesthesia or the breathing tube placed in your throat during surgery. If this causes discomfort, gargle with warm salt water. The discomfort should disappear within 24 hours.  If you had a scopolamine patch placed behind your ear for the management of post- operative nausea and/or vomiting:  1. The medication in the patch is effective for 72 hours, after which it should be removed.  Wrap patch in a tissue and discard in the trash. Wash hands thoroughly with soap and water. 2. You may remove the patch earlier than 72 hours if you experience unpleasant side effects which may include dry mouth, dizziness or visual disturbances. 3. Avoid touching the patch. Wash your hands with soap and water after contact with the patch.      Information for Discharge Teaching: EXPAREL (bupivacaine liposome injectable suspension)   Your surgeon or anesthesiologist gave you EXPAREL(bupivacaine) to help control your pain after surgery.   EXPAREL is a local anesthetic that provides pain relief by numbing the tissue around the surgical site.  EXPAREL is  designed to release pain medication over time and can control pain for up to 72 hours.  Depending on how you respond to EXPAREL, you may require less pain medication during your recovery.  Possible side effects:  Temporary loss of sensation or ability to move in the area where bupivacaine was injected.  Nausea, vomiting, constipation  Rarely, numbness and tingling in your mouth or lips, lightheadedness, or anxiety may occur.  Call your doctor right away if you think you may be experiencing any of these sensations, or if you have other questions regarding possible side effects.  Follow all other discharge instructions given to you by your surgeon or nurse. Eat a healthy diet and drink plenty of water or other fluids.  If you return to the hospital for any reason within 96 hours following the administration of EXPAREL, it is important for health care providers to know that you have received this anesthetic. A teal colored band has been placed on your arm with the date, time and amount of EXPAREL you have received in order to alert and inform your health care providers. Please leave this armband in place for the full 96 hours following administration, and then you may remove the band.

## 2019-06-27 NOTE — Anesthesia Procedure Notes (Signed)
Procedure Name: LMA Insertion Date/Time: 06/27/2019 12:57 PM Performed by: Candis Shine, CRNA Pre-anesthesia Checklist: Patient identified, Emergency Drugs available, Suction available and Patient being monitored Patient Re-evaluated:Patient Re-evaluated prior to induction Oxygen Delivery Method: Circle System Utilized Preoxygenation: Pre-oxygenation with 100% oxygen Induction Type: IV induction LMA: LMA inserted LMA Size: 4.0 Number of attempts: 1 Placement Confirmation: positive ETCO2 Tube secured with: Tape Dental Injury: Teeth and Oropharynx as per pre-operative assessment

## 2019-06-27 NOTE — Transfer of Care (Signed)
Immediate Anesthesia Transfer of Care Note  Patient: Wendy [name]  Procedure(s) Performed: RIGHT BREAST LUMPECTOMY WITH RADIOACTIVE SEED AND SENTINEL LYMPH NODE BIOPSY AND RIGHT TARGETED NODE DISSECTION (Right )  Patient Location: PACU  Anesthesia Type:GA combined with regional for post-op pain  Level of Consciousness: awake, alert  and oriented  Airway & Oxygen Therapy: Patient Spontanous Breathing and Patient connected to face mask oxygen  Post-op Assessment: Report given to RN and Post -op Vital signs reviewed and stable  Post vital signs: Reviewed and stable  Last Vitals:  Vitals Value Taken Time  BP 102/84 06/27/19 1439  Temp    Pulse    Resp 15 06/27/19 1441  SpO2    Vitals shown include unvalidated device data.  Last Pain:  Vitals:   06/27/19 1205  TempSrc: Oral  PainSc: 0-No pain         Complications: No apparent anesthesia complications

## 2019-06-27 NOTE — Anesthesia Procedure Notes (Signed)
Anesthesia Regional Block: Pectoralis block   Pre-Anesthetic Checklist: ,, timeout performed, Correct Patient, Correct Site, Correct Laterality, Correct Procedure, Correct Position, site marked, Risks and benefits discussed,  Surgical consent,  Pre-op evaluation,  At surgeon's request and post-op pain management  Laterality: Right  Prep: chloraprep       Needles:  Injection technique: Single-shot    Needle insertion depth: 6 cm   Additional Needles:   Procedures:,,,, ultrasound used (permanent image in chart),,,,  Narrative:  Start time: 06/27/2019 12:28 PM End time: 06/27/2019 12:33 PM Injection made incrementally with aspirations every 5 mL.  Performed by: Personally  Anesthesiologist: Josephine Igo, MD  Additional Notes: Timeout performed. Patient sedated. Relevant anatomy ID'd using [place]. Incremental 2-45ml injection of LA with frequent aspiration. Patient tolerated procedure well.        Right Pectoralis Block

## 2019-06-27 NOTE — Progress Notes (Signed)
Assisted Dr. Foster with right, ultrasound guided, pectoralis block. Side rails up, monitors on throughout procedure. See vital signs in flow sheet. Tolerated Procedure well. 

## 2019-06-27 NOTE — Progress Notes (Signed)
Nuc med inj performed by nuc med staff. Additional fentanyl given for comfort. Emotional support provided.

## 2019-06-27 NOTE — Anesthesia Postprocedure Evaluation (Signed)
Anesthesia Post Note  Patient: Wendy [name]  Procedure(s) Performed: RIGHT BREAST LUMPECTOMY WITH RADIOACTIVE SEED AND SENTINEL LYMPH NODE BIOPSY AND RIGHT TARGETED NODE DISSECTION (Right )     Patient location during evaluation: PACU Anesthesia Type: General Level of consciousness: awake and alert and oriented Pain management: pain level controlled Vital Signs Assessment: post-procedure vital signs reviewed and stable Respiratory status: spontaneous breathing, nonlabored ventilation and respiratory function stable Cardiovascular status: blood pressure returned to baseline and stable Postop Assessment: no apparent nausea or vomiting Anesthetic complications: no    Last Vitals:  Vitals:   06/27/19 1530 06/27/19 1545  BP: 114/75 131/89  Pulse: 72 60  Resp: 20 18  Temp:    SpO2: (!) 89% 97%    Last Pain:  Vitals:   06/27/19 1500  TempSrc:   PainSc: 8                  Elior Robinette A.

## 2019-07-04 ENCOUNTER — Telehealth: Payer: Self-pay | Admitting: Hematology and Oncology

## 2019-07-04 ENCOUNTER — Other Ambulatory Visit: Payer: Self-pay

## 2019-07-04 NOTE — Telephone Encounter (Signed)
R/s appt per 12/28 sch message pt aware of appt date and time

## 2019-07-05 ENCOUNTER — Ambulatory Visit: Payer: 59 | Admitting: Hematology and Oncology

## 2019-07-05 LAB — SURGICAL PATHOLOGY

## 2019-07-10 NOTE — Progress Notes (Signed)
Patient Care Team: [name], MD as PCP - General (Family Medicine) Mauro Kaufmann, RN as Oncology Nurse Navigator Rockwell Germany, RN as Oncology Nurse Navigator  DIAGNOSIS:    ICD-10-CM   1. Malignant neoplasm of upper-outer quadrant of right breast in female, estrogen receptor positive ([place])  C50.411    Z17.0     SUMMARY OF ONCOLOGIC HISTORY: Oncology History  Malignant neoplasm of upper-outer quadrant of right breast in female, estrogen receptor positive ([place])  05/20/2019 Initial Diagnosis   Palpable right breast mass superficial, ultrasound 11 o'clock position 1.7 cm: Biopsy revealed grade 3 IDC with DCIS ER 100%, PR 30%, Ki-67 20%, HER-2 by IHC negative, axilla lymph node biopsy positive   06/01/2019 Cancer Staging   Staging form: Breast, AJCC 8th Edition - Clinical stage from 06/01/2019: Stage IIA (cT1c, cN1, cM0, G3, ER+, PR+, HER2-) - Signed by Nicholas Lose, MD on 06/01/2019   06/27/2019 Surgery   Right lumpectomy [name]): IDC, grade 3, at least 2.5cm, involving a complex sclerosing lesion, 1.1cm, with high grade DCIS, involved margins, 1/3 right axillary lymph nodes positive, 1.4cm.    07/11/2019 Cancer Staging   Staging form: Breast, AJCC 8th Edition - Pathologic stage from 07/11/2019: Stage IIA (pT2, pN1a, cM0, G3, ER+, PR+, HER2-) - Signed by Nicholas Lose, MD on 07/11/2019     CHIEF COMPLIANT: Follow-up s/p lumpectomy to review pathology  INTERVAL HISTORY: Wendy [name] is a 64 y.o. with above-mentioned history of right breast cancer. Genetic testing was negative. She underwent a right lumpectomy on 06/27/19 with Dr. [name] for which pathology showed invasive ductal carcinoma, grade 3, at least 2.5cm, involving a complex sclerosing lesion, 1.1cm, with high grade DCIS, involved margins, 1/3 right axillary lymph nodes positive for metastatic carcinoma measuring 1.4cm. She presents to the clinic today to review the pathology report and discuss further treatment.    ALLERGIES:  is allergic to vicodin [hydrocodone-acetaminophen] and brimonidine.  MEDICATIONS:  Current Outpatient Medications  Medication Sig Dispense Refill  . dorzolamide-timolol (COSOPT) 22.3-6.8 MG/ML ophthalmic solution PLACE 1 DROP INTO BOTH EYES 2 (TWO) TIMES DAILY.    Marland Kitchen ibuprofen (ADVIL) 600 MG tablet Take 1 tablet (600 mg total) by mouth every 6 (six) hours as needed. 30 tablet 0  . latanoprost (XALATAN) 0.005 % ophthalmic solution 1 drop at bedtime.    . Multiple Vitamin (MULTIVITAMIN) tablet Take 1 tablet by mouth.    Marland Kitchen tiZANidine (ZANAFLEX) 4 MG tablet Take 1 tablet (4 mg total) by mouth every 8 (eight) hours as needed for muscle spasms. (Patient taking differently: Take 4 mg by mouth every 8 (eight) hours as needed for muscle spasms. ) 30 tablet 0  . traMADol (ULTRAM) 50 MG tablet Take 1-2 tablets (50-100 mg total) by mouth every 6 (six) hours as needed. 20 tablet 1   No current facility-administered medications for this visit.    PHYSICAL EXAMINATION: ECOG PERFORMANCE STATUS: 1 - Symptomatic but completely ambulatory  Vitals:   07/11/19 0951  BP: 130/75  Pulse: 86  Resp: 19  Temp: 97.8 F (36.6 C)  SpO2: 97%   Filed Weights   07/11/19 0951  Weight: 213 lb 11.2 oz (96.9 kg)    LABORATORY DATA:  I have reviewed the data as listed CMP Latest Ref Rng & Units 04/30/2018 08/31/2017 03/04/2013  Glucose 65 - 99 mg/dL 91 98 89  BUN 8 - 27 mg/dL '9 10 13  ' Creatinine 0.57 - 1.00 mg/dL 0.75 0.72 0.70  Sodium 134 -  144 mmol/L 142 140 138  Potassium 3.5 - 5.2 mmol/L 3.7 4.1 4.2  Chloride 96 - 106 mmol/L 103 106 105  CO2 20 - 29 mmol/L '24 21 28  ' Calcium 8.7 - 10.3 mg/dL 9.2 9.4 9.4  Total Protein 6.0 - 8.5 g/dL 7.0 7.3 7.6  Total Bilirubin 0.0 - 1.2 mg/dL 0.6 0.3 0.6  Alkaline Phos 39 - 117 IU/L 74 77 86  AST 0 - 40 IU/L '22 17 30  ' ALT 0 - 32 IU/L '12 11 22    ' Lab Results  Component Value Date   WBC 6.3 09/17/2018   HGB 14.4 09/17/2018   HCT 47.7 (H) 09/17/2018    MCV 89.8 09/17/2018   PLT 179 09/17/2018   NEUTROABS 3.4 04/11/2014    ASSESSMENT & PLAN:  Malignant neoplasm of upper-outer quadrant of right breast in female, estrogen receptor positive ([place]) 06/27/2019: Right lumpectomy [name]): IDC, grade 3, at least 2.5cm, involving a complex sclerosing lesion, 1.1cm, with high grade DCIS, involved margins, 1/3 right axillary lymph nodes positive, 1.4cm.  ER 100%, PR 30%, Ki-67 20%, HER-2 IHC negative T2 N1 stage IIA  Pathology counseling: I discussed the final pathology report of the patient provided  a copy of this report. I discussed the margins as well as lymph node surgeries. We also discussed the final staging along with previously performed ER/PR and HER-2/neu testing.  Recommendation: Additional surgery to clear the margins 1. MammaPrint testing to determine if she would benefit from chemotherapy 2.  Adjuvant radiation therapy 3.  Follow-up adjuvant antiestrogen therapy Genetic testing  Return to clinic based upon MammaPrint test results Total time spent: 30 mins including face to face time and time spent for planning, charting and co-ordination of care  No orders of the defined types were placed in this encounter.  The patient has a good understanding of the overall plan. she agrees with it. she will call with any problems that may develop before the next visit here.  Nicholas Lose, MD 07/11/2019  Julious Oka Dorshimer, am acting as scribe for Dr. Nicholas Lose.  I have reviewed the above document for accuracy and completeness, and I agree with the above.

## 2019-07-11 ENCOUNTER — Telehealth: Payer: Self-pay | Admitting: *Deleted

## 2019-07-11 ENCOUNTER — Inpatient Hospital Stay: Payer: No Typology Code available for payment source | Attending: Oncology | Admitting: Hematology and Oncology

## 2019-07-11 ENCOUNTER — Other Ambulatory Visit: Payer: Self-pay

## 2019-07-11 DIAGNOSIS — C50411 Malignant neoplasm of upper-outer quadrant of right female breast: Secondary | ICD-10-CM

## 2019-07-11 DIAGNOSIS — Z170 Estrogen receptor positive status [ER+]: Secondary | ICD-10-CM | POA: Diagnosis not present

## 2019-07-11 DIAGNOSIS — C773 Secondary and unspecified malignant neoplasm of axilla and upper limb lymph nodes: Secondary | ICD-10-CM | POA: Diagnosis not present

## 2019-07-11 NOTE — Assessment & Plan Note (Signed)
06/27/2019: Right lumpectomy [name]): IDC, grade 3, at least 2.5cm, involving a complex sclerosing lesion, 1.1cm, with high grade DCIS, involved margins, 1/3 right axillary lymph nodes positive, 1.4cm.  ER 100%, PR 30%, Ki-67 20%, HER-2 IHC negative T2 N1 stage IIA  Pathology counseling: I discussed the final pathology report of the patient provided  a copy of this report. I discussed the margins as well as lymph node surgeries. We also discussed the final staging along with previously performed ER/PR and HER-2/neu testing.  Recommendation: 1. MammaPrint testing to determine if she would benefit from chemotherapy 2.  Adjuvant radiation therapy 3.  Follow-up adjuvant antiestrogen therapy Genetic testing  Return to clinic based upon MammaPrint test results

## 2019-07-11 NOTE — Telephone Encounter (Signed)
Received order for mammaprint testing. Requisition faxed to pathology and [facility] 

## 2019-07-12 ENCOUNTER — Ambulatory Visit: Payer: Self-pay | Admitting: General Surgery

## 2019-07-18 ENCOUNTER — Encounter (HOSPITAL_COMMUNITY): Payer: Self-pay | Admitting: Hematology and Oncology

## 2019-07-21 ENCOUNTER — Telehealth: Payer: Self-pay | Admitting: *Deleted

## 2019-07-21 NOTE — Telephone Encounter (Signed)
Received call back from patient and she is confirmed for 1/15 at 8:45am to see Dr. Lindi Adie.

## 2019-07-21 NOTE — Telephone Encounter (Signed)
Received mammaprint results of high risk.  Left message for a return phone call for an appointment to discuss with Dr. Lindi Adie.

## 2019-07-21 NOTE — Progress Notes (Signed)
 Patient Care Team: Greene, Jeffrey R, MD as PCP - General (Family Medicine) Stuart, Dawn C, RN as Oncology Nurse Navigator Martini, Keisha N, RN as Oncology Nurse Navigator  DIAGNOSIS:    ICD-10-CM   1. Malignant neoplasm of upper-outer quadrant of right breast in female, estrogen receptor positive ([facility])  C50.411    Z17.0     SUMMARY OF ONCOLOGIC HISTORY: Oncology History  Malignant neoplasm of upper-outer quadrant of right breast in female, estrogen receptor positive ([facility])  05/20/2019 Initial Diagnosis   Palpable right breast mass superficial, ultrasound 11 o'clock position 1.7 cm: Biopsy revealed grade 3 IDC with DCIS ER 100%, PR 30%, Ki-67 20%, HER-2 by IHC negative, axilla lymph node biopsy positive   06/01/2019 Cancer Staging   Staging form: Breast, AJCC 8th Edition - Clinical stage from 06/01/2019: Stage IIA (cT1c, cN1, cM0, G3, ER+, PR+, HER2-) - Signed by Gudena, Vinay, MD on 06/01/2019   06/27/2019 Surgery   Right lumpectomy (Toth): IDC, grade 3, at least 2.5cm, involving a complex sclerosing lesion, 1.1cm, with high grade DCIS, involved margins, 1/3 right axillary lymph nodes positive, 1.4cm.    07/11/2019 Cancer Staging   Staging form: Breast, AJCC 8th Edition - Pathologic stage from 07/11/2019: Stage IIA (pT2, pN1a, cM0, G3, ER+, PR+, HER2-) - Signed by Gudena, Vinay, MD on 07/11/2019   07/19/2019 Oncotype testing   Mammaprint: high risk, 29% chance of recurrence in 10 years without systemic treatment.      CHIEF COMPLIANT: Follow-up to discuss Mammaprint results and further treatment  INTERVAL HISTORY: Wendy [name] is a 64 y.o. with above-mentioned history of right breast cancer who underwent a right lumpectomy. Mammaprint testing showed she was high risk, with a 29% chance of recurrence in 10 years without systemic treatment. She presents to the clinic today to discuss her Mammaprint results and further treatment.   ALLERGIES:  is allergic to vicodin  [hydrocodone-acetaminophen] and brimonidine.  MEDICATIONS:  Current Outpatient Medications  Medication Sig Dispense Refill  . dorzolamide-timolol (COSOPT) 22.3-6.8 MG/ML ophthalmic solution PLACE 1 DROP INTO BOTH EYES 2 (TWO) TIMES DAILY.    . ibuprofen (ADVIL) 600 MG tablet Take 1 tablet (600 mg total) by mouth every 6 (six) hours as needed. 30 tablet 0  . latanoprost (XALATAN) 0.005 % ophthalmic solution 1 drop at bedtime.    . Multiple Vitamin (MULTIVITAMIN) tablet Take 1 tablet by mouth.    . tiZANidine (ZANAFLEX) 4 MG tablet Take 1 tablet (4 mg total) by mouth every 8 (eight) hours as needed for muscle spasms. (Patient taking differently: Take 4 mg by mouth every 8 (eight) hours as needed for muscle spasms. ) 30 tablet 0  . traMADol (ULTRAM) 50 MG tablet Take 1-2 tablets (50-100 mg total) by mouth every 6 (six) hours as needed. 20 tablet 1   No current facility-administered medications for this visit.    PHYSICAL EXAMINATION: ECOG PERFORMANCE STATUS: 1 - Symptomatic but completely ambulatory  Vitals:   07/22/19 0853  BP: (!) 121/58  Pulse: 82  Resp: 20  Temp: 98.2 F (36.8 C)  SpO2: 99%   Filed Weights   07/22/19 0853  Weight: 216 lb 4.8 oz (98.1 kg)    LABORATORY DATA:  I have reviewed the data as listed CMP Latest Ref Rng & Units 04/30/2018 08/31/2017 03/04/2013  Glucose 65 - 99 mg/dL 91 98 89  BUN 8 - 27 mg/dL 9 10 13  Creatinine 0.57 - 1.00 mg/dL 0.75 0.72 0.70  Sodium 134 - 144 mmol/L   142 140 138  Potassium 3.5 - 5.2 mmol/L 3.7 4.1 4.2  Chloride 96 - 106 mmol/L 103 106 105  CO2 20 - 29 mmol/L 24 21 28  Calcium 8.7 - 10.3 mg/dL 9.2 9.4 9.4  Total Protein 6.0 - 8.5 g/dL 7.0 7.3 7.6  Total Bilirubin 0.0 - 1.2 mg/dL 0.6 0.3 0.6  Alkaline Phos 39 - 117 IU/L 74 77 86  AST 0 - 40 IU/L 22 17 30  ALT 0 - 32 IU/L 12 11 22    Lab Results  Component Value Date   WBC 6.3 09/17/2018   HGB 14.4 09/17/2018   HCT 47.7 (H) 09/17/2018   MCV 89.8 09/17/2018   PLT 179  09/17/2018   NEUTROABS 3.4 04/11/2014    ASSESSMENT & PLAN:  Malignant neoplasm of upper-outer quadrant of right breast in female, estrogen receptor positive ([facility]) 06/27/2019: Right lumpectomy ([facility]): IDC, grade 3, at least 2.5cm, involving a complex sclerosing lesion, 1.1cm, with high grade DCIS, involved margins, 1/3 right axillary lymph nodes positive, 1.4cm.  ER 100%, PR 30%, Ki-67 20%, HER-2 IHC negative T2 N1 stage IIA  Pathology counseling: I discussed the final pathology report of the patient provided  a copy of this report. I discussed the margins as well as lymph node surgeries. We also discussed the final staging along with previously performed ER/PR and HER-2/neu testing.  Recommendation: Additional surgery to clear the margins 1.   MammaPrint: High risk, 10-year risk of recurrence untreated: 29% 2.Adjuvant chemotherapy with Taxotere and Cytoxan every 3 weeks x4 3.  Adjuvant radiation therapy 4.Follow-up adjuvant antiestrogen therapy Genetic testing  MammaPrint counseling: I discussed MammaPrint test result in detail for the patient.  Chemotherapy Counseling: I discussed the risks and benefits of chemotherapy including the risks of nausea/ vomiting, risk of infection from low WBC count, fatigue due to chemo or anemia, bruising or bleeding due to low platelets, mouth sores, loss/ change in taste and decreased appetite. Liver and kidney function will be monitored through out chemotherapy as abnormalities in liver and kidney function may be a side effect of treatment. neuropathy risk from Taxol were discussed in detail. Risk of permanent bone marrow dysfunction and leukemia due to chemo were also discussed.  Patient wishes to receive OnPro instead of Udenyca. Return to clinic after port has been placed to start chemotherapy  No orders of the defined types were placed in this encounter.  The patient has a good understanding of the overall plan. she agrees with it. she will  call with any problems that may develop before the next visit here.  Total time spent: 30 mins including face to face time and time spent for planning, charting and coordination of care  Gudena, Vinay, MD 07/22/2019  I, Molly Dorshimer, am acting as scribe for Dr. Vinay Gudena.  I have reviewed the above documentation for accuracy and completeness, and I agree with the above.       

## 2019-07-22 ENCOUNTER — Inpatient Hospital Stay (HOSPITAL_BASED_OUTPATIENT_CLINIC_OR_DEPARTMENT_OTHER): Payer: No Typology Code available for payment source | Admitting: Hematology and Oncology

## 2019-07-22 ENCOUNTER — Other Ambulatory Visit: Payer: Self-pay

## 2019-07-22 ENCOUNTER — Encounter: Payer: Self-pay | Admitting: *Deleted

## 2019-07-22 VITALS — BP 121/58 | HR 82 | Temp 98.2°F | Resp 20 | Ht 63.0 in | Wt 216.3 lb

## 2019-07-22 DIAGNOSIS — C50411 Malignant neoplasm of upper-outer quadrant of right female breast: Secondary | ICD-10-CM

## 2019-07-22 DIAGNOSIS — Z170 Estrogen receptor positive status [ER+]: Secondary | ICD-10-CM | POA: Diagnosis not present

## 2019-07-22 MED ORDER — ONDANSETRON HCL 8 MG PO TABS: 8.0000 mg | ORAL_TABLET | Freq: Two times a day (BID) | ORAL | 1 refills | Status: DC | PRN

## 2019-07-22 MED ORDER — LIDOCAINE-PRILOCAINE 2.5-2.5 % EX CREA: TOPICAL_CREAM | CUTANEOUS | 3 refills | Status: DC

## 2019-07-22 MED ORDER — PROCHLORPERAZINE MALEATE 10 MG PO TABS: 10.0000 mg | ORAL_TABLET | Freq: Four times a day (QID) | ORAL | 1 refills | Status: DC | PRN

## 2019-07-22 MED ORDER — LORAZEPAM 0.5 MG PO TABS: 0.5000 mg | ORAL_TABLET | Freq: Every evening | ORAL | 0 refills | Status: DC | PRN

## 2019-07-22 MED ORDER — DEXAMETHASONE 4 MG PO TABS: 4.0000 mg | ORAL_TABLET | Freq: Every day | ORAL | 0 refills | Status: DC

## 2019-07-22 NOTE — Assessment & Plan Note (Addendum)
06/27/2019: Right lumpectomy [name]): IDC, grade 3, at least 2.5cm, involving a complex sclerosing lesion, 1.1cm, with high grade DCIS, involved margins, 1/3 right axillary lymph nodes positive, 1.4cm.  ER 100%, PR 30%, Ki-67 20%, HER-2 IHC negative T2 N1 stage IIA  Pathology counseling: I discussed the final pathology report of the patient provided  a copy of this report. I discussed the margins as well as lymph node surgeries. We also discussed the final staging along with previously performed ER/PR and HER-2/neu testing.  Recommendation: Additional surgery to clear the margins 1.   MammaPrint: High risk, 10-year risk of recurrence untreated: 29% 2.Adjuvant chemotherapy with Taxotere and Cytoxan every 3 weeks x4 3.  Adjuvant radiation therapy 4.Follow-up adjuvant antiestrogen therapy Genetic testing  MammaPrint counseling: I discussed MammaPrint test result in detail for the patient.  Chemotherapy Counseling: I discussed the risks and benefits of chemotherapy including the risks of nausea/ vomiting, risk of infection from low WBC count, fatigue due to chemo or anemia, bruising or bleeding due to low platelets, mouth sores, loss/ change in taste and decreased appetite. Liver and kidney function will be monitored through out chemotherapy as abnormalities in liver and kidney function may be a side effect of treatment. neuropathy risk from Taxol were discussed in detail. Risk of permanent bone marrow dysfunction and leukemia due to chemo were also discussed.  Return to clinic after port has been placed to start chemotherapy    Return to clinic based upon MammaPrint test results

## 2019-07-22 NOTE — Progress Notes (Signed)
START ON PATHWAY REGIMEN - Breast     A cycle is every 21 days:     Docetaxel      Cyclophosphamide   **Always confirm dose/schedule in your pharmacy ordering system**  Patient Characteristics: Postoperative without Neoadjuvant Therapy (Pathologic Staging), Invasive Disease, Adjuvant Therapy, HER2 Negative/Unknown/Equivocal, ER Positive, Node Positive, Node Positive (1-3), MammaPrint(R) Ordered, High Genomic Risk Therapeutic Status: Postoperative without Neoadjuvant Therapy (Pathologic Staging) AJCC Grade: G3 AJCC N Category: pN1a AJCC M Category: cM0 ER Status: Positive (+) AJCC 8 Stage Grouping: IIA HER2 Status: Negative (-) Oncotype Dx Recurrence Score: Not Appropriate AJCC T Category: pT2 PR Status: Positive (+) Has this patient completed genomic testing<= Yes - MammaPrint(R) MammaPrint(R) Score: High Genomic Risk Intent of Therapy: Curative Intent, Discussed with Patient

## 2019-07-25 ENCOUNTER — Telehealth: Payer: Self-pay | Admitting: Hematology and Oncology

## 2019-07-25 NOTE — Telephone Encounter (Signed)
Scheduled appt per 1/15 sch message - unable to reach pt left message with appt date and time

## 2019-07-25 NOTE — Telephone Encounter (Signed)
I talk with patient regarding schedule  

## 2019-07-26 ENCOUNTER — Other Ambulatory Visit: Payer: Self-pay

## 2019-07-26 ENCOUNTER — Encounter (HOSPITAL_BASED_OUTPATIENT_CLINIC_OR_DEPARTMENT_OTHER): Payer: Self-pay | Admitting: General Surgery

## 2019-07-28 ENCOUNTER — Other Ambulatory Visit (HOSPITAL_COMMUNITY)
Admission: RE | Admit: 2019-07-28 | Discharge: 2019-07-28 | Disposition: A | Discharge status: Home / Self Care | Payer: No Typology Code available for payment source | Source: Ambulatory Visit | Attending: General Surgery | Admitting: General Surgery

## 2019-07-28 DIAGNOSIS — Z20822 Contact with and (suspected) exposure to covid-19: Secondary | ICD-10-CM | POA: Insufficient documentation

## 2019-07-28 DIAGNOSIS — Z01812 Encounter for preprocedural laboratory examination: Secondary | ICD-10-CM | POA: Insufficient documentation

## 2019-07-28 LAB — SARS CORONAVIRUS 2 (TAT 6-24 HRS): SARS Coronavirus 2: NEGATIVE

## 2019-07-29 NOTE — Progress Notes (Signed)
With instruction for CHG     Enhanced Recovery after Surgery for Orthopedics Enhanced Recovery after Surgery is a protocol used to improve the stress on your body and your recovery after surgery.  Patient Instructions  . The night before surgery:  o No food after midnight. ONLY clear liquids after midnight  . The day of surgery (if you do NOT have diabetes):  o Drink ONE (1) Pre-Surgery Clear Ensure as directed.   o This drink was given to you during your hospital  pre-op appointment visit. o The pre-op nurse will instruct you on the time to drink the  Pre-Surgery Ensure depending on your surgery time. o Finish the drink at the designated time by the pre-op nurse.  o Nothing else to drink after completing the  Pre-Surgery Clear Ensure.  . The day of surgery (if you have diabetes): o Drink ONE (1) Gatorade 2 (G2) as directed. o This drink was given to you during your hospital  pre-op appointment visit.  o The pre-op nurse will instruct you on the time to drink the   Gatorade 2 (G2) depending on your surgery time. o Color of the Gatorade may vary. Red is not allowed. o Nothing else to drink after completing the  Gatorade 2 (G2).         If you have questions, please contact your surgeon's office.

## 2019-08-01 ENCOUNTER — Ambulatory Visit (HOSPITAL_COMMUNITY): Payer: No Typology Code available for payment source

## 2019-08-01 ENCOUNTER — Encounter (HOSPITAL_BASED_OUTPATIENT_CLINIC_OR_DEPARTMENT_OTHER)
Admission: RE | Disposition: A | Discharge status: Home / Self Care | Payer: Self-pay | Source: Home / Self Care | Attending: General Surgery

## 2019-08-01 ENCOUNTER — Ambulatory Visit (HOSPITAL_BASED_OUTPATIENT_CLINIC_OR_DEPARTMENT_OTHER): Payer: No Typology Code available for payment source | Admitting: Certified Registered"

## 2019-08-01 ENCOUNTER — Other Ambulatory Visit: Payer: Self-pay

## 2019-08-01 ENCOUNTER — Encounter (HOSPITAL_BASED_OUTPATIENT_CLINIC_OR_DEPARTMENT_OTHER): Payer: Self-pay | Admitting: General Surgery

## 2019-08-01 ENCOUNTER — Encounter: Payer: Self-pay | Admitting: *Deleted

## 2019-08-01 ENCOUNTER — Ambulatory Visit (HOSPITAL_BASED_OUTPATIENT_CLINIC_OR_DEPARTMENT_OTHER)
Admission: RE | Admit: 2019-08-01 | Discharge: 2019-08-01 | Disposition: A | Discharge status: Home / Self Care | Payer: No Typology Code available for payment source | Attending: General Surgery | Admitting: General Surgery

## 2019-08-01 DIAGNOSIS — Z85038 Personal history of other malignant neoplasm of large intestine: Secondary | ICD-10-CM | POA: Insufficient documentation

## 2019-08-01 DIAGNOSIS — E669 Obesity, unspecified: Secondary | ICD-10-CM | POA: Diagnosis not present

## 2019-08-01 DIAGNOSIS — C50411 Malignant neoplasm of upper-outer quadrant of right female breast: Secondary | ICD-10-CM | POA: Insufficient documentation

## 2019-08-01 DIAGNOSIS — H409 Unspecified glaucoma: Secondary | ICD-10-CM | POA: Insufficient documentation

## 2019-08-01 DIAGNOSIS — Z885 Allergy status to narcotic agent status: Secondary | ICD-10-CM | POA: Diagnosis not present

## 2019-08-01 DIAGNOSIS — Z6838 Body mass index (BMI) 38.0-38.9, adult: Secondary | ICD-10-CM | POA: Insufficient documentation

## 2019-08-01 DIAGNOSIS — Z09 Encounter for follow-up examination after completed treatment for conditions other than malignant neoplasm: Secondary | ICD-10-CM

## 2019-08-01 DIAGNOSIS — Z886 Allergy status to analgesic agent status: Secondary | ICD-10-CM | POA: Diagnosis not present

## 2019-08-01 DIAGNOSIS — Z170 Estrogen receptor positive status [ER+]: Secondary | ICD-10-CM | POA: Diagnosis not present

## 2019-08-01 DIAGNOSIS — Z95828 Presence of other vascular implants and grafts: Secondary | ICD-10-CM

## 2019-08-01 HISTORY — PX: PORTACATH PLACEMENT: SHX2246

## 2019-08-01 HISTORY — PX: RE-EXCISION OF BREAST LUMPECTOMY: SHX6048

## 2019-08-01 SURGERY — EXCISION, LESION, BREAST
Anesthesia: General | Site: Chest | Laterality: Right

## 2019-08-01 MED ORDER — HEPARIN (PORCINE) IN NACL 2-0.9 UNITS/ML
INTRAMUSCULAR | Status: AC | PRN
  Administered 2019-08-01: 1

## 2019-08-01 MED ORDER — CHLORHEXIDINE GLUCONATE CLOTH 2 % EX PADS: 6.0000 | MEDICATED_PAD | Freq: Once | CUTANEOUS | Status: DC

## 2019-08-01 MED ORDER — METHYLENE BLUE 0.5 % INJ SOLN
INTRAVENOUS | Status: AC
  Filled 2019-08-01: qty 10

## 2019-08-01 MED ORDER — FENTANYL CITRATE (PF) 100 MCG/2ML IJ SOLN
INTRAMUSCULAR | Status: AC
  Filled 2019-08-01: qty 2

## 2019-08-01 MED ORDER — MIDAZOLAM HCL 2 MG/2ML IJ SOLN
INTRAMUSCULAR | Status: AC
  Filled 2019-08-01: qty 2

## 2019-08-01 MED ORDER — ONDANSETRON HCL 4 MG/2ML IJ SOLN
INTRAMUSCULAR | Status: DC | PRN
  Administered 2019-08-01: 4 mg via INTRAVENOUS

## 2019-08-01 MED ORDER — GABAPENTIN 300 MG PO CAPS
300.0000 mg | ORAL_CAPSULE | ORAL | Status: AC
  Administered 2019-08-01: 300 mg via ORAL

## 2019-08-01 MED ORDER — LIDOCAINE 2% (20 MG/ML) 5 ML SYRINGE
INTRAMUSCULAR | Status: DC | PRN
  Administered 2019-08-01: 50 mg via INTRAVENOUS

## 2019-08-01 MED ORDER — PHENYLEPHRINE 40 MCG/ML (10ML) SYRINGE FOR IV PUSH (FOR BLOOD PRESSURE SUPPORT)
PREFILLED_SYRINGE | INTRAVENOUS | Status: AC
  Filled 2019-08-01: qty 10

## 2019-08-01 MED ORDER — LIDOCAINE 2% (20 MG/ML) 5 ML SYRINGE
INTRAMUSCULAR | Status: AC
  Filled 2019-08-01: qty 5

## 2019-08-01 MED ORDER — FENTANYL CITRATE (PF) 100 MCG/2ML IJ SOLN
25.0000 ug | INTRAMUSCULAR | Status: DC | PRN
  Administered 2019-08-01: 25 ug via INTRAVENOUS

## 2019-08-01 MED ORDER — OXYCODONE-ACETAMINOPHEN 5-325 MG PO TABS: 1.0000 | ORAL_TABLET | Freq: Four times a day (QID) | ORAL | 0 refills | Status: DC | PRN

## 2019-08-01 MED ORDER — ONDANSETRON HCL 4 MG/2ML IJ SOLN
INTRAMUSCULAR | Status: AC
  Filled 2019-08-01: qty 2

## 2019-08-01 MED ORDER — HEPARIN SOD (PORK) LOCK FLUSH 100 UNIT/ML IV SOLN
INTRAVENOUS | Status: DC | PRN
  Administered 2019-08-01: 400 [IU] via INTRAVENOUS

## 2019-08-01 MED ORDER — LACTATED RINGERS IV SOLN: INTRAVENOUS | Status: DC | PRN

## 2019-08-01 MED ORDER — HEPARIN SOD (PORK) LOCK FLUSH 100 UNIT/ML IV SOLN
INTRAVENOUS | Status: AC
  Filled 2019-08-01: qty 5

## 2019-08-01 MED ORDER — PROPOFOL 10 MG/ML IV BOLUS
INTRAVENOUS | Status: DC | PRN
  Administered 2019-08-01: 200 mg via INTRAVENOUS

## 2019-08-01 MED ORDER — DEXAMETHASONE SODIUM PHOSPHATE 10 MG/ML IJ SOLN
INTRAMUSCULAR | Status: AC
  Filled 2019-08-01: qty 1

## 2019-08-01 MED ORDER — HEPARIN (PORCINE) IN NACL 1000-0.9 UT/500ML-% IV SOLN
INTRAVENOUS | Status: AC
  Filled 2019-08-01: qty 500

## 2019-08-01 MED ORDER — CELECOXIB 200 MG PO CAPS
200.0000 mg | ORAL_CAPSULE | ORAL | Status: AC
  Administered 2019-08-01: 200 mg via ORAL

## 2019-08-01 MED ORDER — GABAPENTIN 300 MG PO CAPS
ORAL_CAPSULE | ORAL | Status: AC
  Filled 2019-08-01: qty 1

## 2019-08-01 MED ORDER — BUPIVACAINE HCL (PF) 0.25 % IJ SOLN
INTRAMUSCULAR | Status: AC
  Filled 2019-08-01: qty 90

## 2019-08-01 MED ORDER — SODIUM CHLORIDE (PF) 0.9 % IJ SOLN
INTRAMUSCULAR | Status: AC
  Filled 2019-08-01: qty 10

## 2019-08-01 MED ORDER — DEXAMETHASONE SODIUM PHOSPHATE 10 MG/ML IJ SOLN
INTRAMUSCULAR | Status: DC | PRN
  Administered 2019-08-01: 10 mg via INTRAVENOUS

## 2019-08-01 MED ORDER — MIDAZOLAM HCL 2 MG/2ML IJ SOLN
1.0000 mg | INTRAMUSCULAR | Status: DC | PRN
  Administered 2019-08-01: 2 mg via INTRAVENOUS

## 2019-08-01 MED ORDER — LACTATED RINGERS IV SOLN: INTRAVENOUS | Status: DC

## 2019-08-01 MED ORDER — PHENYLEPHRINE 40 MCG/ML (10ML) SYRINGE FOR IV PUSH (FOR BLOOD PRESSURE SUPPORT)
PREFILLED_SYRINGE | INTRAVENOUS | Status: DC | PRN
  Administered 2019-08-01 (×10): 80 ug via INTRAVENOUS

## 2019-08-01 MED ORDER — PROPOFOL 10 MG/ML IV BOLUS
INTRAVENOUS | Status: AC
  Filled 2019-08-01: qty 20

## 2019-08-01 MED ORDER — BUPIVACAINE HCL (PF) 0.25 % IJ SOLN
INTRAMUSCULAR | Status: DC | PRN
  Administered 2019-08-01: 17 mL

## 2019-08-01 MED ORDER — FENTANYL CITRATE (PF) 100 MCG/2ML IJ SOLN
50.0000 ug | INTRAMUSCULAR | Status: AC | PRN
  Administered 2019-08-01: 25 ug via INTRAVENOUS
  Administered 2019-08-01: 50 ug via INTRAVENOUS
  Administered 2019-08-01: 25 ug via INTRAVENOUS

## 2019-08-01 MED ORDER — CEFAZOLIN SODIUM-DEXTROSE 2-4 GM/100ML-% IV SOLN
INTRAVENOUS | Status: AC
  Filled 2019-08-01: qty 100

## 2019-08-01 MED ORDER — ONDANSETRON HCL 4 MG/2ML IJ SOLN: 4.0000 mg | Freq: Once | INTRAMUSCULAR | Status: DC | PRN

## 2019-08-01 MED ORDER — CELECOXIB 200 MG PO CAPS
ORAL_CAPSULE | ORAL | Status: AC
  Filled 2019-08-01: qty 1

## 2019-08-01 MED ORDER — CEFAZOLIN SODIUM-DEXTROSE 2-4 GM/100ML-% IV SOLN
2.0000 g | INTRAVENOUS | Status: AC
  Administered 2019-08-01: 2 g via INTRAVENOUS

## 2019-08-01 SURGICAL SUPPLY — 58 items
APPLIER CLIP 9.375 MED OPEN (MISCELLANEOUS)
BAG DECANTER FOR FLEXI CONT (MISCELLANEOUS) ×3 IMPLANT
BLADE SURG 15 STRL LF DISP TIS (BLADE) ×2 IMPLANT
BLADE SURG 15 STRL SS (BLADE) ×1
CANISTER SUCT 1200ML W/VALVE (MISCELLANEOUS) ×3 IMPLANT
CHLORAPREP W/TINT 26 (MISCELLANEOUS) ×3 IMPLANT
CLEANER CAUTERY TIP 5X5 PAD (MISCELLANEOUS) ×2 IMPLANT
CLIP APPLIE 9.375 MED OPEN (MISCELLANEOUS) IMPLANT
COVER BACK TABLE 60X90IN (DRAPES) ×3 IMPLANT
COVER MAYO STAND STRL (DRAPES) ×3 IMPLANT
COVER WAND RF STERILE (DRAPES) IMPLANT
DECANTER SPIKE VIAL GLASS SM (MISCELLANEOUS) IMPLANT
DERMABOND ADVANCED (GAUZE/BANDAGES/DRESSINGS) ×1
DERMABOND ADVANCED .7 DNX12 (GAUZE/BANDAGES/DRESSINGS) ×2 IMPLANT
DRAPE C-ARM 42X72 X-RAY (DRAPES) ×3 IMPLANT
DRAPE LAPAROSCOPIC ABDOMINAL (DRAPES) ×3 IMPLANT
DRAPE UTILITY XL STRL (DRAPES) ×3 IMPLANT
ELECT COATED BLADE 2.86 ST (ELECTRODE) ×3 IMPLANT
ELECT REM PT RETURN 9FT ADLT (ELECTROSURGICAL) ×3
ELECTRODE REM PT RTRN 9FT ADLT (ELECTROSURGICAL) ×2 IMPLANT
GLOVE BIO SURGEON STRL SZ 6.5 (GLOVE) ×3 IMPLANT
GLOVE BIO SURGEON STRL SZ7.5 (GLOVE) ×3 IMPLANT
GLOVE BIOGEL PI IND STRL 7.0 (GLOVE) ×4 IMPLANT
GLOVE BIOGEL PI INDICATOR 7.0 (GLOVE) ×2
GOWN STRL REUS W/ TWL LRG LVL3 (GOWN DISPOSABLE) ×4 IMPLANT
GOWN STRL REUS W/TWL LRG LVL3 (GOWN DISPOSABLE) ×2
ILLUMINATOR WAVEGUIDE N/F (MISCELLANEOUS) IMPLANT
IV KIT MINILOC 20X1 SAFETY (NEEDLE) IMPLANT
KIT MARKER MARGIN INK (KITS) ×3 IMPLANT
KIT PORT POWER 8FR ISP CVUE (Port) ×3 IMPLANT
LIGHT WAVEGUIDE WIDE FLAT (MISCELLANEOUS) IMPLANT
NDL SAFETY ECLIPSE 18X1.5 (NEEDLE) IMPLANT
NEEDLE HYPO 18GX1.5 SHARP (NEEDLE)
NEEDLE HYPO 22GX1.5 SAFETY (NEEDLE) IMPLANT
NEEDLE HYPO 25X1 1.5 SAFETY (NEEDLE) ×3 IMPLANT
NEEDLE SPNL 22GX3.5 QUINCKE BK (NEEDLE) IMPLANT
NS IRRIG 1000ML POUR BTL (IV SOLUTION) ×3 IMPLANT
PACK BASIN DAY SURGERY FS (CUSTOM PROCEDURE TRAY) ×3 IMPLANT
PAD CLEANER CAUTERY TIP 5X5 (MISCELLANEOUS) ×1
PENCIL SMOKE EVACUATOR (MISCELLANEOUS) ×3 IMPLANT
SLEEVE SCD COMPRESS KNEE MED (MISCELLANEOUS) ×3 IMPLANT
SPONGE LAP 18X18 RF (DISPOSABLE) ×3 IMPLANT
STAPLER VISISTAT 35W (STAPLE) IMPLANT
SUT MON AB 4-0 PC3 18 (SUTURE) ×3 IMPLANT
SUT PROLENE 2 0 SH DA (SUTURE) ×3 IMPLANT
SUT SILK 2 0 SH (SUTURE) IMPLANT
SUT SILK 2 0 TIES 17X18 (SUTURE)
SUT SILK 2-0 18XBRD TIE BLK (SUTURE) IMPLANT
SUT VIC AB 3-0 SH 27 (SUTURE) ×1
SUT VIC AB 3-0 SH 27X BRD (SUTURE) ×2 IMPLANT
SUT VICRYL 3-0 CR8 SH (SUTURE) ×3 IMPLANT
SYR 10ML LL (SYRINGE) IMPLANT
SYR 5ML LL (SYRINGE) ×3 IMPLANT
SYR CONTROL 10ML LL (SYRINGE) ×6 IMPLANT
TOWEL GREEN STERILE FF (TOWEL DISPOSABLE) ×6 IMPLANT
TRAY FAXITRON CT DISP (TRAY / TRAY PROCEDURE) IMPLANT
TUBE CONNECTING 20X1/4 (TUBING) ×3 IMPLANT
YANKAUER SUCT BULB TIP NO VENT (SUCTIONS) ×3 IMPLANT

## 2019-08-01 NOTE — Op Note (Addendum)
08/01/2019  11:34 AM  PATIENT:  Wendy [name]  64 y.o. female  PRE-OPERATIVE DIAGNOSIS:  right breast cancer  POST-OPERATIVE DIAGNOSIS:  right breast cancer  PROCEDURE:  Procedure(s): RE-EXCISION OF RIGHT BREAST CANCER  MARGINS (Right) INSERTION PORT-A-CATH (Left)  SURGEON:  Surgeon(s) and Role:    * Jovita Kussmaul, MD - Primary  PHYSICIAN ASSISTANT:   ASSISTANTS: none   ANESTHESIA:   local and general  EBL:  5 mL   BLOOD ADMINISTERED:none  DRAINS: none   LOCAL MEDICATIONS USED:  MARCAINE     SPECIMEN:  Source of Specimen:  right breast superior, medial, and inferior margins  DISPOSITION OF SPECIMEN:  PATHOLOGY  COUNTS:  YES  TOURNIQUET:  * No tourniquets in log *  DICTATION: .Dragon Dictation   After informed consent was obtained the patient was brought to the operating room and placed in the supine position on the operating table.  After adequate induction of general anesthesia a roll was placed between the patient's shoulders to extend the shoulder slightly.  The bilateral chest, breast, and neck areas were then prepped with ChloraPrep, allowed to dry, and draped in usual sterile manner.  An appropriate timeout was performed.  The patient was placed in Trendelenburg position.  The area lateral to the bend of the clavicle on the left chest wall was then infiltrated with quarter percent Marcaine.  A large bore needle from the Port-A-Cath kit was used to slide beneath the bend of the clavicle heading towards the sternal notch and in doing so I was able to access the left subclavian vein without difficulty.  A wire was fed through the needle using the Seldinger technique without difficulty.  The wire was confirmed in the central venous system using real-time fluoroscopy.  Next a small incision was made at the wire entry site on the left chest wall with a 15 blade knife.  The incision was carried through the skin and subcutaneous tissue sharply with the electrocautery.  A  subcutaneous pocket was created inferior to the incision by blunt finger dissection.  Next the tubing was placed on the reservoir.  The reservoir was placed in the pocket and the length of the tubing was again estimated using real-time fluoroscopy.  The tubing was cut to the appropriate length.  Next the sheath and dilator were fed over the wire using the Seldinger technique without difficulty.  The dilator and wire were removed from the patient.  The tubing was fed through the sheath as far as it would go and then held in place while the sheath was gently cracked and separated.  Another real-time fluoroscopy image showed the tip of the catheter to be in the distal superior vena cava.  The tubing was then permanently anchored to the reservoir.  The reservoir was anchored in the pocket with two 2-0 Prolene stitches.  The port was then aspirated and it aspirated blood easily.  The port was then flushed initially with a dilute heparin solution and then with a more concentrated heparin solution.  The subcutaneous tissue was closed over the port with interrupted 3-0 Vicryl stitches.  The skin was closed with a running 4-0 Monocryl subcuticular stitch.  Dermabond dressings were applied.  The attention was then turned to the right breast.  The area around the previous incision was infiltrated with quarter percent Marcaine.  The previous incision was reopened sharply with a 15 blade knife.  The incision was carried through the skin and subcutaneous tissue sharply with the electrocautery  until the lumpectomy cavity was encountered.  The cavity was completely opened.  The superior, medial, and inferior margins were then reexcised sharply with the electrocautery.  Each margin was marked with the appropriate paint color. The cavity was marked with clips.  The wound was irrigated with saline and infiltrated with quarter percent Marcaine.  The deep layer of the wound was then closed with layers of interrupted 3-0 Vicryl stitches.   The skin was then closed with a running 4-0 Monocryl subcuticular stitch.  Dermabond dressings were applied.  The patient tolerated the procedure well.  At the end of the case all needle sponge and instrument counts were correct.  The patient was then awakened and taken to recovery in stable condition.  PLAN OF CARE: Discharge to home after PACU  PATIENT DISPOSITION:  PACU - hemodynamically stable.   Delay start of Pharmacological VTE agent (>24hrs) due to surgical blood loss or risk of bleeding: not applicable

## 2019-08-01 NOTE — Transfer of Care (Signed)
Immediate Anesthesia Transfer of Care Note  Patient: Wendy [name]  Procedure(s) Performed: RE-EXCISION OF RIGHT BREAST CANCER  MARGINS (Right Breast) INSERTION PORT-A-CATH (Left Chest)  Patient Location: PACU  Anesthesia Type:General  Level of Consciousness: drowsy  Airway & Oxygen Therapy: Patient Spontanous Breathing and Patient connected to face mask oxygen  Post-op Assessment: Report given to RN and Post -op Vital signs reviewed and stable  Post vital signs: Reviewed and stable  Last Vitals:  Vitals Value Taken Time  BP    Temp    Pulse    Resp    SpO2      Last Pain:  Vitals:   08/01/19 0937  TempSrc: Tympanic  PainSc: 0-No pain         Complications: No apparent anesthesia complications

## 2019-08-01 NOTE — Anesthesia Preprocedure Evaluation (Addendum)
Anesthesia Evaluation  Patient identified by MRN, date of birth, ID band Patient awake    Reviewed: Allergy & Precautions, NPO status , Patient's Chart, lab work & pertinent test results  Airway Mallampati: IV  TM Distance: >3 FB Neck ROM: Full    Dental no notable dental hx. (+) Teeth Intact   Pulmonary neg pulmonary ROS,    Pulmonary exam normal breath sounds clear to auscultation       Cardiovascular negative cardio ROS Normal cardiovascular exam Rhythm:Regular Rate:Normal     Neuro/Psych Glaucoma negative neurological ROS  negative psych ROS   GI/Hepatic Neg liver ROS, GERD  Medicated and Controlled,Hx/o Colon Ca   Endo/Other  Obesity Right breast Ca  Renal/GU negative Renal ROS  negative genitourinary   Musculoskeletal  (+) Arthritis , Osteoarthritis,    Abdominal (+) + obese,   Peds  Hematology negative hematology ROS (+)   Anesthesia Other Findings   Reproductive/Obstetrics                             Lab Results  Component Value Date   WBC 6.3 09/17/2018   HGB 14.4 09/17/2018   HCT 47.7 (H) 09/17/2018   MCV 89.8 09/17/2018   PLT 179 09/17/2018   Lab Results  Component Value Date   CREATININE 0.75 04/30/2018   BUN 9 04/30/2018   NA 142 04/30/2018   K 3.7 04/30/2018   CL 103 04/30/2018   CO2 24 04/30/2018    Anesthesia Physical  Anesthesia Plan  ASA: III  Anesthesia Plan: General   Post-op Pain Management:    Induction: Intravenous  PONV Risk Score and Plan: 4 or greater and Treatment may vary due to age or medical condition, Ondansetron, Dexamethasone and Midazolam  Airway Management Planned: LMA  Additional Equipment:   Intra-op Plan:   Post-operative Plan: Extubation in OR  Informed Consent: I have reviewed the patients History and Physical, chart, labs and discussed the procedure including the risks, benefits and alternatives for the proposed  anesthesia with the patient or authorized representative who has indicated his/her understanding and acceptance.     Dental advisory given  Plan Discussed with: CRNA and Surgeon  Anesthesia Plan Comments:        Anesthesia Quick Evaluation

## 2019-08-01 NOTE — Anesthesia Procedure Notes (Signed)
Procedure Name: LMA Insertion Date/Time: 08/01/2019 10:22 AM Performed by: Gwyndolyn Saxon, CRNA Pre-anesthesia Checklist: Patient identified, Emergency Drugs available, Suction available and Patient being monitored Patient Re-evaluated:Patient Re-evaluated prior to induction Oxygen Delivery Method: Circle system utilized Preoxygenation: Pre-oxygenation with 100% oxygen Induction Type: IV induction Ventilation: Mask ventilation without difficulty and Oral airway inserted - appropriate to patient size LMA: LMA inserted LMA Size: 4.0 Number of attempts: 1 Placement Confirmation: positive ETCO2 and breath sounds checked- equal and bilateral Tube secured with: Tape Dental Injury: Teeth and Oropharynx as per pre-operative assessment

## 2019-08-01 NOTE — H&P (Signed)
Wendy [name]  Location: [facility] Patient #: [id number] DOB: 1955/07/22 Divorced / Language: English / Race: Black or African American Female   History of Present Illness  The patient is a 64 year old female who presents for a follow-up for Breast cancer. The patient is a 64 year old white female who is about 3 weeks status post right breast lumpectomy and sentinel node biopsy for a T2 N1 A right breast cancer that was ER/PR positive and HER-2 negative with a Ki-67 of 20%. Her mammogram came back as high risk so she will also need a Port-A-Cath for chemotherapy. She tolerated the surgery well. She mainly complains of some soreness in the axilla and that soreness seems to radiate down some of the right arm.   Allergies Vicodin ES *ANALGESICS - OPIOID*  Allergies Reconciled   Medication History  Latanoprost (0.005% Solution, Ophthalmic) Active. Medications Reconciled    Review of Systems  General Not Present- Appetite Loss, Chills, Fatigue, Fever, Night Sweats, Weight Gain and Weight Loss. Note: All other systems negative (unless as noted in HPI & included Review of Systems) Skin Not Present- Change in Wart/Mole, Dryness, Hives, Jaundice, New Lesions, Non-Healing Wounds, Rash and Ulcer. HEENT Not Present- Earache, Hearing Loss, Hoarseness, Nose Bleed, Oral Ulcers, Ringing in the Ears, Seasonal Allergies, Sinus Pain, Sore Throat, Visual Disturbances, Wears glasses/contact lenses and Yellow Eyes. Respiratory Not Present- Bloody sputum, Chronic Cough, Difficulty Breathing, Snoring and Wheezing. Breast Not Present- Breast Mass, Breast Pain, Nipple Discharge and Skin Changes. Cardiovascular Not Present- Chest Pain, Difficulty Breathing Lying Down, Leg Cramps, Palpitations, Rapid Heart Rate, Shortness of Breath and Swelling of Extremities. Gastrointestinal Not Present- Abdominal Pain, Bloating, Bloody Stool, Change in Bowel Habits, Chronic diarrhea, Constipation,  Difficulty Swallowing, Excessive gas, Gets full quickly at meals, Hemorrhoids, Indigestion, Nausea, Rectal Pain and Vomiting. Female Genitourinary Not Present- Frequency, Nocturia, Painful Urination, Pelvic Pain and Urgency. Musculoskeletal Not Present- Back Pain, Joint Pain, Joint Stiffness, Muscle Pain, Muscle Weakness and Swelling of Extremities. Neurological Not Present- Decreased Memory, Fainting, Headaches, Numbness, Seizures, Tingling, Tremor, Trouble walking and Weakness. Psychiatric Not Present- Anxiety, Bipolar, Change in Sleep Pattern, Depression, Fearful and Frequent crying. Endocrine Not Present- Cold Intolerance, Excessive Hunger, Hair Changes, Heat Intolerance, Hot flashes and New Diabetes. Hematology Not Present- Easy Bruising, Excessive bleeding, Gland problems, HIV and Persistent Infections.  Vitals  Weight: 217.8 lb Height: 63in Body Surface Area: 2.01 m Body Mass Index: 38.58 kg/m  Temp.: 97.83F(Tympanic)  Pulse: 111 (Regular)  BP: 124/72 (Sitting, Left Arm, Standard)       Physical Exam  General Mental Status-Alert. General Appearance-Consistent with stated age. Hydration-Well hydrated. Voice-Normal.  Head and Neck Head-normocephalic, atraumatic with no lesions or palpable masses. Trachea-midline. Thyroid Gland Characteristics - normal size and consistency.  Eye Eyeball - Bilateral-Extraocular movements intact. Sclera/Conjunctiva - Bilateral-No scleral icterus.  Chest and Lung Exam Chest and lung exam reveals -quiet, even and easy respiratory effort with no use of accessory muscles and on auscultation, normal breath sounds, no adventitious sounds and normal vocal resonance. Inspection Chest Wall - Normal. Back - normal.  Breast Note: The right breast upper outer quadrant incision and axillary incision are healing nicely with no sign of infection or significant seroma.   Cardiovascular Cardiovascular examination  reveals -normal heart sounds, regular rate and rhythm with no murmurs and normal pedal pulses bilaterally.  Abdomen Inspection Inspection of the abdomen reveals - No Hernias. Skin - Scar - no surgical scars. Palpation/Percussion Palpation and Percussion of the abdomen reveal -  Soft, Non Tender, No Rebound tenderness, No Rigidity (guarding) and No hepatosplenomegaly. Auscultation Auscultation of the abdomen reveals - Bowel sounds normal.  Neurologic Neurologic evaluation reveals -alert and oriented x 3 with no impairment of recent or remote memory. Mental Status-Normal.  Musculoskeletal Normal Exam - Left-Upper Extremity Strength Normal and Lower Extremity Strength Normal. Normal Exam - Right-Upper Extremity Strength Normal and Lower Extremity Strength Normal.  Lymphatic Head & Neck  General Head & Neck Lymphatics: Bilateral - Description - Normal. Axillary  General Axillary Region: Bilateral - Description - Normal. Tenderness - Non Tender. Femoral & Inguinal  Generalized Femoral & Inguinal Lymphatics: Bilateral - Description - Normal. Tenderness - Non Tender.    Assessment & Plan  MALIGNANT NEOPLASM OF UPPER-OUTER QUADRANT OF RIGHT BREAST IN FEMALE, ESTROGEN RECEPTOR POSITIVE (C50.411) Impression: The patient is about 3 weeks status post right breast lumpectomy for breast cancer. She had 1 positive node as well as 3 positive margins. The margins will need to be excised. Because her mammoprint came back high risk she will also need a Port-A-Cath for chemotherapy. I have discussed with her in detail the risks and benefits of the operation as well as some of the technical aspects and she understands and wishes to proceed. She is scheduled for January 25 with follow-up on February 9

## 2019-08-01 NOTE — Interval H&P Note (Signed)
History and Physical Interval Note:  08/01/2019 9:55 AM  Wendy [name]  has presented today for surgery, with the diagnosis of right breast cancer.  The various methods of treatment have been discussed with the patient and family. After consideration of risks, benefits and other options for treatment, the patient has consented to  Procedure(s): RE-EXCISION OF RIGHT BREAST CANCER  MARGINS (Right) INSERTION PORT-A-CATH WITH POSSIBLE ULTRASOUND (N/A) as a surgical intervention.  The patient's history has been reviewed, patient examined, no change in status, stable for surgery.  I have reviewed the patient's chart and labs.  Questions were answered to the patient's satisfaction.     Autumn Messing III

## 2019-08-01 NOTE — Anesthesia Postprocedure Evaluation (Signed)
Anesthesia Post Note  Patient: Wendy [name]  Procedure(s) Performed: RE-EXCISION OF RIGHT BREAST CANCER  MARGINS (Right Breast) INSERTION PORT-A-CATH (Left Chest)     Patient location during evaluation: PACU Anesthesia Type: General Level of consciousness: awake and alert Pain management: pain level controlled Vital Signs Assessment: post-procedure vital signs reviewed and stable Respiratory status: spontaneous breathing, nonlabored ventilation, respiratory function stable and patient connected to nasal cannula oxygen Cardiovascular status: blood pressure returned to baseline and stable Postop Assessment: no apparent nausea or vomiting Anesthetic complications: no    Last Vitals:  Vitals:   08/01/19 1245 08/01/19 1307  BP: 111/78 119/72  Pulse: (!) 59 64  Resp: 14 16  Temp: 36.4 C 36.6 C  SpO2: 99% 100%    Last Pain:  Vitals:   08/01/19 1307  TempSrc:   PainSc: 3                  Tiajuana Amass

## 2019-08-02 ENCOUNTER — Encounter: Payer: Self-pay | Admitting: *Deleted

## 2019-08-03 LAB — SURGICAL PATHOLOGY

## 2019-08-04 ENCOUNTER — Ambulatory Visit: Payer: Self-pay | Admitting: General Surgery

## 2019-08-05 ENCOUNTER — Encounter (HOSPITAL_BASED_OUTPATIENT_CLINIC_OR_DEPARTMENT_OTHER): Payer: Self-pay | Admitting: General Surgery

## 2019-08-05 ENCOUNTER — Encounter: Payer: Self-pay | Admitting: *Deleted

## 2019-08-05 ENCOUNTER — Telehealth: Payer: Self-pay | Admitting: *Deleted

## 2019-08-05 ENCOUNTER — Other Ambulatory Visit: Payer: Self-pay

## 2019-08-05 NOTE — Telephone Encounter (Signed)
Returned [employer] call request for ongoing treatment plan. 1. Chemotherapy start date? Projected start date 08/22/2019. 2. Advised out of work? Not at this time. 3. Work acommodations?  None with anticipated TC treatment side effects  Requested [employer] disability form for Oncology as current request [employer] received from surgery.  [employer] "Will send form if further information needed."

## 2019-08-09 ENCOUNTER — Other Ambulatory Visit (HOSPITAL_COMMUNITY)
Admission: RE | Admit: 2019-08-09 | Discharge: 2019-08-09 | Disposition: A | Discharge status: Home / Self Care | Payer: No Typology Code available for payment source | Source: Ambulatory Visit | Attending: Orthopaedic Surgery | Admitting: Orthopaedic Surgery

## 2019-08-09 DIAGNOSIS — Z01812 Encounter for preprocedural laboratory examination: Secondary | ICD-10-CM | POA: Diagnosis present

## 2019-08-09 DIAGNOSIS — Z20822 Contact with and (suspected) exposure to covid-19: Secondary | ICD-10-CM | POA: Insufficient documentation

## 2019-08-09 LAB — SARS CORONAVIRUS 2 (TAT 6-24 HRS): SARS Coronavirus 2: NEGATIVE

## 2019-08-09 NOTE — Progress Notes (Signed)

## 2019-08-11 ENCOUNTER — Telehealth: Payer: Self-pay | Admitting: *Deleted

## 2019-08-11 NOTE — Telephone Encounter (Signed)
Left message for a return phone call to answer patient questions.

## 2019-08-11 NOTE — Anesthesia Preprocedure Evaluation (Addendum)
Anesthesia Evaluation  Patient identified by MRN, date of birth, ID band Patient awake    Reviewed: Allergy & Precautions, NPO status , Patient's Chart, lab work & pertinent test results  History of Anesthesia Complications Negative for: history of anesthetic complications  Airway Mallampati: II  TM Distance: >3 FB Neck ROM: Full    Dental no notable dental hx.    Pulmonary neg pulmonary ROS,    Pulmonary exam normal        Cardiovascular negative cardio ROS Normal cardiovascular exam     Neuro/Psych negative neurological ROS  negative psych ROS   GI/Hepatic Neg liver ROS, GERD  Controlled,  Endo/Other  negative endocrine ROS  Renal/GU negative Renal ROS  negative genitourinary   Musculoskeletal  (+) Arthritis ,   Abdominal   Peds  Hematology negative hematology ROS (+)   Anesthesia Other Findings Breast ca  Reproductive/Obstetrics negative OB ROS                            Anesthesia Physical Anesthesia Plan  ASA: II  Anesthesia Plan: General   Post-op Pain Management:    Induction: Intravenous  PONV Risk Score and Plan: 4 or greater and Treatment may vary due to age or medical condition, Ondansetron, Dexamethasone and Midazolam  Airway Management Planned: LMA  Additional Equipment:   Intra-op Plan:   Post-operative Plan: Extubation in OR  Informed Consent: I have reviewed the patients History and Physical, chart, labs and discussed the procedure including the risks, benefits and alternatives for the proposed anesthesia with the patient or authorized representative who has indicated his/her understanding and acceptance.     Dental advisory given  Plan Discussed with: CRNA  Anesthesia Plan Comments:        Anesthesia Quick Evaluation

## 2019-08-12 ENCOUNTER — Ambulatory Visit (HOSPITAL_BASED_OUTPATIENT_CLINIC_OR_DEPARTMENT_OTHER): Payer: No Typology Code available for payment source | Admitting: Anesthesiology

## 2019-08-12 ENCOUNTER — Ambulatory Visit (HOSPITAL_BASED_OUTPATIENT_CLINIC_OR_DEPARTMENT_OTHER)
Admission: RE | Admit: 2019-08-12 | Discharge: 2019-08-12 | Disposition: A | Discharge status: Home / Self Care | Payer: No Typology Code available for payment source | Attending: General Surgery | Admitting: General Surgery

## 2019-08-12 ENCOUNTER — Encounter (HOSPITAL_BASED_OUTPATIENT_CLINIC_OR_DEPARTMENT_OTHER)
Admission: RE | Disposition: A | Discharge status: Home / Self Care | Payer: Self-pay | Source: Home / Self Care | Attending: General Surgery

## 2019-08-12 ENCOUNTER — Other Ambulatory Visit: Payer: Self-pay

## 2019-08-12 ENCOUNTER — Encounter (HOSPITAL_BASED_OUTPATIENT_CLINIC_OR_DEPARTMENT_OTHER): Payer: Self-pay | Admitting: General Surgery

## 2019-08-12 DIAGNOSIS — K219 Gastro-esophageal reflux disease without esophagitis: Secondary | ICD-10-CM | POA: Diagnosis not present

## 2019-08-12 DIAGNOSIS — C50411 Malignant neoplasm of upper-outer quadrant of right female breast: Secondary | ICD-10-CM | POA: Insufficient documentation

## 2019-08-12 DIAGNOSIS — Z803 Family history of malignant neoplasm of breast: Secondary | ICD-10-CM | POA: Diagnosis not present

## 2019-08-12 DIAGNOSIS — M1990 Unspecified osteoarthritis, unspecified site: Secondary | ICD-10-CM | POA: Insufficient documentation

## 2019-08-12 DIAGNOSIS — Z170 Estrogen receptor positive status [ER+]: Secondary | ICD-10-CM | POA: Diagnosis not present

## 2019-08-12 DIAGNOSIS — Z79899 Other long term (current) drug therapy: Secondary | ICD-10-CM | POA: Insufficient documentation

## 2019-08-12 HISTORY — PX: RE-EXCISION OF BREAST CANCER,SUPERIOR MARGINS: SHX6047

## 2019-08-12 SURGERY — RE-EXCISION OF BREAST CANCER,SUPERIOR MARGINS
Anesthesia: General | Site: Breast | Laterality: Right

## 2019-08-12 MED ORDER — LIDOCAINE 2% (20 MG/ML) 5 ML SYRINGE
INTRAMUSCULAR | Status: AC
  Filled 2019-08-12: qty 5

## 2019-08-12 MED ORDER — LIDOCAINE-EPINEPHRINE 1 %-1:100000 IJ SOLN
INTRAMUSCULAR | Status: AC
  Filled 2019-08-12: qty 1

## 2019-08-12 MED ORDER — GABAPENTIN 300 MG PO CAPS
ORAL_CAPSULE | ORAL | Status: AC
  Filled 2019-08-12: qty 1

## 2019-08-12 MED ORDER — MIDAZOLAM HCL 5 MG/5ML IJ SOLN
INTRAMUSCULAR | Status: DC | PRN
  Administered 2019-08-12: 2 mg via INTRAVENOUS

## 2019-08-12 MED ORDER — LIDOCAINE-EPINEPHRINE 0.5 %-1:200000 IJ SOLN
INTRAMUSCULAR | Status: AC
  Filled 2019-08-12: qty 1

## 2019-08-12 MED ORDER — PROPOFOL 10 MG/ML IV BOLUS
INTRAVENOUS | Status: DC | PRN
  Administered 2019-08-12: 200 mg via INTRAVENOUS

## 2019-08-12 MED ORDER — CHLORHEXIDINE GLUCONATE CLOTH 2 % EX PADS: 6.0000 | MEDICATED_PAD | Freq: Once | CUTANEOUS | Status: DC

## 2019-08-12 MED ORDER — ONDANSETRON HCL 4 MG/2ML IJ SOLN
INTRAMUSCULAR | Status: AC
  Filled 2019-08-12: qty 2

## 2019-08-12 MED ORDER — MIDAZOLAM HCL 2 MG/2ML IJ SOLN
INTRAMUSCULAR | Status: AC
  Filled 2019-08-12: qty 2

## 2019-08-12 MED ORDER — FENTANYL CITRATE (PF) 250 MCG/5ML IJ SOLN
INTRAMUSCULAR | Status: DC | PRN
  Administered 2019-08-12: 50 ug via INTRAVENOUS
  Administered 2019-08-12: 25 ug via INTRAVENOUS

## 2019-08-12 MED ORDER — OXYCODONE HCL 5 MG PO TABS: 5.0000 mg | ORAL_TABLET | Freq: Once | ORAL | Status: DC | PRN

## 2019-08-12 MED ORDER — LACTATED RINGERS IV SOLN: INTRAVENOUS | Status: DC

## 2019-08-12 MED ORDER — ROCURONIUM BROMIDE 10 MG/ML (PF) SYRINGE
PREFILLED_SYRINGE | INTRAVENOUS | Status: AC
  Filled 2019-08-12: qty 10

## 2019-08-12 MED ORDER — FENTANYL CITRATE (PF) 100 MCG/2ML IJ SOLN
INTRAMUSCULAR | Status: AC
  Filled 2019-08-12: qty 2

## 2019-08-12 MED ORDER — PROMETHAZINE HCL 25 MG/ML IJ SOLN: 6.2500 mg | INTRAMUSCULAR | Status: DC | PRN

## 2019-08-12 MED ORDER — BUPIVACAINE HCL (PF) 0.25 % IJ SOLN
INTRAMUSCULAR | Status: AC
  Filled 2019-08-12: qty 30

## 2019-08-12 MED ORDER — OXYCODONE HCL 5 MG/5ML PO SOLN: 5.0000 mg | Freq: Once | ORAL | Status: DC | PRN

## 2019-08-12 MED ORDER — DEXAMETHASONE SODIUM PHOSPHATE 10 MG/ML IJ SOLN
INTRAMUSCULAR | Status: AC
  Filled 2019-08-12: qty 1

## 2019-08-12 MED ORDER — CELECOXIB 200 MG PO CAPS
ORAL_CAPSULE | ORAL | Status: AC
  Filled 2019-08-12: qty 1

## 2019-08-12 MED ORDER — FENTANYL CITRATE (PF) 100 MCG/2ML IJ SOLN
25.0000 ug | INTRAMUSCULAR | Status: DC | PRN
  Administered 2019-08-12 (×2): 50 ug via INTRAVENOUS

## 2019-08-12 MED ORDER — DEXAMETHASONE SODIUM PHOSPHATE 10 MG/ML IJ SOLN
INTRAMUSCULAR | Status: DC | PRN
  Administered 2019-08-12: 4 mg via INTRAVENOUS

## 2019-08-12 MED ORDER — PHENYLEPHRINE 40 MCG/ML (10ML) SYRINGE FOR IV PUSH (FOR BLOOD PRESSURE SUPPORT)
PREFILLED_SYRINGE | INTRAVENOUS | Status: AC
  Filled 2019-08-12: qty 10

## 2019-08-12 MED ORDER — OXYCODONE-ACETAMINOPHEN 5-325 MG PO TABS: 1.0000 | ORAL_TABLET | Freq: Four times a day (QID) | ORAL | 0 refills | Status: DC | PRN

## 2019-08-12 MED ORDER — CELECOXIB 200 MG PO CAPS
200.0000 mg | ORAL_CAPSULE | ORAL | Status: AC
  Administered 2019-08-12: 200 mg via ORAL

## 2019-08-12 MED ORDER — MIDAZOLAM HCL 2 MG/2ML IJ SOLN: 1.0000 mg | INTRAMUSCULAR | Status: DC | PRN

## 2019-08-12 MED ORDER — FENTANYL CITRATE (PF) 100 MCG/2ML IJ SOLN: 50.0000 ug | INTRAMUSCULAR | Status: DC | PRN

## 2019-08-12 MED ORDER — BUPIVACAINE HCL (PF) 0.25 % IJ SOLN
INTRAMUSCULAR | Status: DC | PRN
  Administered 2019-08-12: 20 mL

## 2019-08-12 MED ORDER — CEFAZOLIN SODIUM-DEXTROSE 2-4 GM/100ML-% IV SOLN
2.0000 g | INTRAVENOUS | Status: AC
  Administered 2019-08-12: 09:00:00 2 g via INTRAVENOUS

## 2019-08-12 MED ORDER — PHENYLEPHRINE 40 MCG/ML (10ML) SYRINGE FOR IV PUSH (FOR BLOOD PRESSURE SUPPORT)
PREFILLED_SYRINGE | INTRAVENOUS | Status: DC | PRN
  Administered 2019-08-12 (×5): 80 ug via INTRAVENOUS

## 2019-08-12 MED ORDER — GABAPENTIN 300 MG PO CAPS
300.0000 mg | ORAL_CAPSULE | ORAL | Status: AC
  Administered 2019-08-12: 300 mg via ORAL

## 2019-08-12 MED ORDER — LIDOCAINE 2% (20 MG/ML) 5 ML SYRINGE
INTRAMUSCULAR | Status: DC | PRN
  Administered 2019-08-12: 100 mg via INTRAVENOUS

## 2019-08-12 MED ORDER — ACETAMINOPHEN 500 MG PO TABS
ORAL_TABLET | ORAL | Status: AC
  Filled 2019-08-12: qty 2

## 2019-08-12 MED ORDER — ACETAMINOPHEN 500 MG PO TABS
1000.0000 mg | ORAL_TABLET | Freq: Once | ORAL | Status: AC
  Administered 2019-08-12: 1000 mg via ORAL

## 2019-08-12 MED ORDER — ONDANSETRON HCL 4 MG/2ML IJ SOLN
INTRAMUSCULAR | Status: DC | PRN
  Administered 2019-08-12: 4 mg via INTRAVENOUS

## 2019-08-12 MED ORDER — CEFAZOLIN SODIUM-DEXTROSE 2-4 GM/100ML-% IV SOLN
INTRAVENOUS | Status: AC
  Filled 2019-08-12: qty 100

## 2019-08-12 MED ORDER — PROPOFOL 10 MG/ML IV BOLUS
INTRAVENOUS | Status: AC
  Filled 2019-08-12: qty 20

## 2019-08-12 SURGICAL SUPPLY — 38 items
APPLIER CLIP 9.375 MED OPEN (MISCELLANEOUS)
BLADE SURG 15 STRL LF DISP TIS (BLADE) ×1 IMPLANT
BLADE SURG 15 STRL SS (BLADE) ×1
CANISTER SUCT 1200ML W/VALVE (MISCELLANEOUS) ×2 IMPLANT
CHLORAPREP W/TINT 26 (MISCELLANEOUS) ×2 IMPLANT
CLIP APPLIE 9.375 MED OPEN (MISCELLANEOUS) IMPLANT
COVER BACK TABLE 60X90IN (DRAPES) ×2 IMPLANT
COVER MAYO STAND STRL (DRAPES) ×2 IMPLANT
COVER WAND RF STERILE (DRAPES) IMPLANT
DECANTER SPIKE VIAL GLASS SM (MISCELLANEOUS) IMPLANT
DERMABOND ADVANCED (GAUZE/BANDAGES/DRESSINGS) ×1
DERMABOND ADVANCED .7 DNX12 (GAUZE/BANDAGES/DRESSINGS) ×1 IMPLANT
DRAPE LAPAROSCOPIC ABDOMINAL (DRAPES) ×2 IMPLANT
DRAPE UTILITY XL STRL (DRAPES) ×2 IMPLANT
ELECT COATED BLADE 2.86 ST (ELECTRODE) ×2 IMPLANT
ELECT REM PT RETURN 9FT ADLT (ELECTROSURGICAL) ×2
ELECTRODE REM PT RTRN 9FT ADLT (ELECTROSURGICAL) ×1 IMPLANT
GLOVE BIO SURGEON STRL SZ7.5 (GLOVE) ×2 IMPLANT
GOWN STRL REUS W/ TWL LRG LVL3 (GOWN DISPOSABLE) ×2 IMPLANT
GOWN STRL REUS W/TWL LRG LVL3 (GOWN DISPOSABLE) ×2
ILLUMINATOR WAVEGUIDE N/F (MISCELLANEOUS) IMPLANT
KIT MARKER MARGIN INK (KITS) IMPLANT
LIGHT WAVEGUIDE WIDE FLAT (MISCELLANEOUS) IMPLANT
NEEDLE HYPO 25X1 1.5 SAFETY (NEEDLE) ×2 IMPLANT
NS IRRIG 1000ML POUR BTL (IV SOLUTION) ×2 IMPLANT
PACK BASIN DAY SURGERY FS (CUSTOM PROCEDURE TRAY) ×2 IMPLANT
PENCIL SMOKE EVACUATOR (MISCELLANEOUS) ×2 IMPLANT
SLEEVE SCD COMPRESS KNEE MED (MISCELLANEOUS) ×2 IMPLANT
SPONGE LAP 18X18 RF (DISPOSABLE) ×2 IMPLANT
STAPLER VISISTAT 35W (STAPLE) IMPLANT
SUT MON AB 4-0 PC3 18 (SUTURE) ×2 IMPLANT
SUT SILK 2 0 SH (SUTURE) IMPLANT
SUT VICRYL 3-0 CR8 SH (SUTURE) ×2 IMPLANT
SYR CONTROL 10ML LL (SYRINGE) ×2 IMPLANT
TOWEL GREEN STERILE FF (TOWEL DISPOSABLE) ×2 IMPLANT
TRAY FAXITRON CT DISP (TRAY / TRAY PROCEDURE) IMPLANT
TUBE CONNECTING 20X1/4 (TUBING) ×2 IMPLANT
YANKAUER SUCT BULB TIP NO VENT (SUCTIONS) ×2 IMPLANT

## 2019-08-12 NOTE — Transfer of Care (Addendum)
Immediate Anesthesia Transfer of Care Note  Patient: Wendy [name]  Procedure(s) Performed: RE-EXCISION OF RIGHT BREAST,SUPERIOR MARGINS (Right Breast)  Patient Location: PACU  Anesthesia Type:General  Level of Consciousness: awake, alert , oriented and patient cooperative  Airway & Oxygen Therapy: Patient Spontanous Breathing and Patient connected to face mask oxygen  Post-op Assessment: Report given to RN, Post -op Vital signs reviewed and stable and Patient moving all extremities  Post vital signs: Reviewed and stable  Last Vitals:  Vitals Value Taken Time  BP 114/71 08/12/19 0925  Temp    Pulse 73 08/12/19 0926  Resp 14 08/12/19 0926  SpO2 100 % 08/12/19 0926  Vitals shown include unvalidated device data.  Last Pain:  Vitals:   08/12/19 0736  TempSrc: Tympanic  PainSc: 3       Patients Stated Pain Goal: 3 ([id number])  Complications: No apparent anesthesia complications

## 2019-08-12 NOTE — Anesthesia Postprocedure Evaluation (Signed)
Anesthesia Post Note  Patient: Wendy [name]  Procedure(s) Performed: RE-EXCISION OF RIGHT BREAST,SUPERIOR MARGINS (Right Breast)     Patient location during evaluation: PACU Anesthesia Type: General Level of consciousness: awake and alert and oriented Pain management: pain level controlled Vital Signs Assessment: post-procedure vital signs reviewed and stable Respiratory status: spontaneous breathing, nonlabored ventilation and respiratory function stable Cardiovascular status: blood pressure returned to baseline Postop Assessment: no apparent nausea or vomiting Anesthetic complications: no    Last Vitals:  Vitals:   08/12/19 0945 08/12/19 1000  BP: 121/80 121/79  Pulse: 61 66  Resp: 11 14  Temp:    SpO2: 100% 97%    Last Pain:  Vitals:   08/12/19 1000  TempSrc:   PainSc: [place]

## 2019-08-12 NOTE — Interval H&P Note (Signed)
History and Physical Interval Note:  08/12/2019 8:13 AM  Wendy [name]  has presented today for surgery, with the diagnosis of RIGHT BREAST CANCER.  The various methods of treatment have been discussed with the patient and family. After consideration of risks, benefits and other options for treatment, the patient has consented to  Procedure(s): RE-EXCISION OF RIGHT BREAST,SUPERIOR MARGINS (Right) as a surgical intervention.  The patient's history has been reviewed, patient examined, no change in status, stable for surgery.  I have reviewed the patient's chart and labs.  Questions were answered to the patient's satisfaction.     Autumn Messing III

## 2019-08-12 NOTE — Anesthesia Procedure Notes (Signed)
Procedure Name: LMA Insertion Date/Time: 08/12/2019 8:31 AM Performed by: Myna Bright, CRNA Patient Re-evaluated:Patient Re-evaluated prior to induction Oxygen Delivery Method: Circle system utilized Preoxygenation: Pre-oxygenation with 100% oxygen Induction Type: IV induction Ventilation: Mask ventilation without difficulty LMA: LMA inserted LMA Size: 4.0 Tube type: Oral Number of attempts: 1 Placement Confirmation: positive ETCO2 and breath sounds checked- equal and bilateral Tube secured with: Tape Dental Injury: Teeth and Oropharynx as per pre-operative assessment

## 2019-08-12 NOTE — H&P (Signed)
Wendy [name]  Location: [facility] Patient #: [id number] DOB: 04-18-1956 Divorced / Language: English / Race: Black or African American Female   History of Present Illness The patient is a 64 year old female who presents for a follow-up for Breast cancer. The patient is a 64 year old white female who is about 3 weeks status post right breast lumpectomy and sentinel node biopsy for a T2 N1 A right breast cancer that was ER/PR positive and HER-2 negative with a Ki-67 of 20%. Her mammogram came back as high risk so she will also need a Port-A-Cath for chemotherapy. She tolerated the surgery well. She mainly complains of some soreness in the axilla and that soreness seems to radiate down some of the right arm.   Allergies  Vicodin ES *ANALGESICS - OPIOID*  Allergies Reconciled   Medication History  Latanoprost (0.005% Solution, Ophthalmic) Active. Medications Reconciled    Review of Systems  General Not Present- Appetite Loss, Chills, Fatigue, Fever, Night Sweats, Weight Gain and Weight Loss. Note: All other systems negative (unless as noted in HPI & included Review of Systems) Skin Not Present- Change in Wart/Mole, Dryness, Hives, Jaundice, New Lesions, Non-Healing Wounds, Rash and Ulcer. HEENT Not Present- Earache, Hearing Loss, Hoarseness, Nose Bleed, Oral Ulcers, Ringing in the Ears, Seasonal Allergies, Sinus Pain, Sore Throat, Visual Disturbances, Wears glasses/contact lenses and Yellow Eyes. Respiratory Not Present- Bloody sputum, Chronic Cough, Difficulty Breathing, Snoring and Wheezing. Breast Not Present- Breast Mass, Breast Pain, Nipple Discharge and Skin Changes. Cardiovascular Not Present- Chest Pain, Difficulty Breathing Lying Down, Leg Cramps, Palpitations, Rapid Heart Rate, Shortness of Breath and Swelling of Extremities. Gastrointestinal Not Present- Abdominal Pain, Bloating, Bloody Stool, Change in Bowel Habits, Chronic diarrhea, Constipation,  Difficulty Swallowing, Excessive gas, Gets full quickly at meals, Hemorrhoids, Indigestion, Nausea, Rectal Pain and Vomiting. Female Genitourinary Not Present- Frequency, Nocturia, Painful Urination, Pelvic Pain and Urgency. Musculoskeletal Not Present- Back Pain, Joint Pain, Joint Stiffness, Muscle Pain, Muscle Weakness and Swelling of Extremities. Neurological Not Present- Decreased Memory, Fainting, Headaches, Numbness, Seizures, Tingling, Tremor, Trouble walking and Weakness. Psychiatric Not Present- Anxiety, Bipolar, Change in Sleep Pattern, Depression, Fearful and Frequent crying. Endocrine Not Present- Cold Intolerance, Excessive Hunger, Hair Changes, Heat Intolerance, Hot flashes and New Diabetes. Hematology Not Present- Easy Bruising, Excessive bleeding, Gland problems, HIV and Persistent Infections.  Vitals Weight: 217.8 lb Height: 63in Body Surface Area: 2.01 m Body Mass Index: 38.58 kg/m  Temp.: 97.34F(Tympanic)  Pulse: 111 (Regular)  BP: 124/72 (Sitting, Left Arm, Standard)       Physical Exam  General Mental Status-Alert. General Appearance-Consistent with stated age. Hydration-Well hydrated. Voice-Normal.  Head and Neck Head-normocephalic, atraumatic with no lesions or palpable masses. Trachea-midline. Thyroid Gland Characteristics - normal size and consistency.  Eye Eyeball - Bilateral-Extraocular movements intact. Sclera/Conjunctiva - Bilateral-No scleral icterus.  Chest and Lung Exam Chest and lung exam reveals -quiet, even and easy respiratory effort with no use of accessory muscles and on auscultation, normal breath sounds, no adventitious sounds and normal vocal resonance. Inspection Chest Wall - Normal. Back - normal.  Breast Note: The right breast upper outer quadrant incision and axillary incision are healing nicely with no sign of infection or significant seroma.   Cardiovascular Cardiovascular examination  reveals -normal heart sounds, regular rate and rhythm with no murmurs and normal pedal pulses bilaterally.  Abdomen Inspection Inspection of the abdomen reveals - No Hernias. Skin - Scar - no surgical scars. Palpation/Percussion Palpation and Percussion of the abdomen reveal -  Soft, Non Tender, No Rebound tenderness, No Rigidity (guarding) and No hepatosplenomegaly. Auscultation Auscultation of the abdomen reveals - Bowel sounds normal.  Neurologic Neurologic evaluation reveals -alert and oriented x 3 with no impairment of recent or remote memory. Mental Status-Normal.  Musculoskeletal Normal Exam - Left-Upper Extremity Strength Normal and Lower Extremity Strength Normal. Normal Exam - Right-Upper Extremity Strength Normal and Lower Extremity Strength Normal.  Lymphatic Head & Neck  General Head & Neck Lymphatics: Bilateral - Description - Normal. Axillary  General Axillary Region: Bilateral - Description - Normal. Tenderness - Non Tender. Femoral & Inguinal  Generalized Femoral & Inguinal Lymphatics: Bilateral - Description - Normal. Tenderness - Non Tender.    Assessment & Plan  MALIGNANT NEOPLASM OF UPPER-OUTER QUADRANT OF RIGHT BREAST IN FEMALE, ESTROGEN RECEPTOR POSITIVE (C50.411) Impression: The patient is about 5 weeks status post right breast lumpectomy for breast cancer. She still has a positive superior margin which will be excised today. I have discussed with her in detail the risks and benefits of the operation as well as some of the technical aspects and she understands and wishes to proceed. She is scheduled for January 25 with follow-up on February 9

## 2019-08-12 NOTE — Discharge Instructions (Signed)
  Post Anesthesia Home Care Instructions  Activity: Get plenty of rest for the remainder of the day. A responsible individual must stay with you for 24 hours following the procedure.  For the next 24 hours, DO NOT: -Drive a car -Paediatric nurse -Drink alcoholic beverages -Take any medication unless instructed by your physician -Make any legal decisions or sign important papers.  Meals: Start with liquid foods such as gelatin or soup. Progress to regular foods as tolerated. Avoid greasy, spicy, heavy foods. If nausea and/or vomiting occur, drink only clear liquids until the nausea and/or vomiting subsides. Call your physician if vomiting continues.  Special Instructions/Symptoms: Your throat may feel dry or sore from the anesthesia or the breathing tube placed in your throat during surgery. If this causes discomfort, gargle with warm salt water. The discomfort should disappear within 24 hours.  If you had a scopolamine patch placed behind your ear for the management of post- operative nausea and/or vomiting:  1. The medication in the patch is effective for 72 hours, after which it should be removed.  Wrap patch in a tissue and discard in the trash. Wash hands thoroughly with soap and water. 2. You may remove the patch earlier than 72 hours if you experience unpleasant side effects which may include dry mouth, dizziness or visual disturbances. 3. Avoid touching the patch. Wash your hands with soap and water after contact with the patch.  No tylenol today until after 2pm.  No ibuprofen until after 4pm today.

## 2019-08-12 NOTE — Op Note (Signed)
08/12/2019  9:22 AM  PATIENT:  Wendy [name]  64 y.o. female  PRE-OPERATIVE DIAGNOSIS:  RIGHT BREAST CANCER  POST-OPERATIVE DIAGNOSIS:  RIGHT BREAST CANCER  PROCEDURE:  Procedure(s): RE-EXCISION OF RIGHT BREAST,SUPERIOR, INFERIOR, AND MEDIAL MARGINS  SURGEON:  Surgeon(s) and Role:    * Jovita Kussmaul, MD - Primary  PHYSICIAN ASSISTANT:   ASSISTANTS: none   ANESTHESIA:   local and general  EBL:  minimal   BLOOD ADMINISTERED:none  DRAINS: none   LOCAL MEDICATIONS USED:  MARCAINE     SPECIMEN:  Source of Specimen:  Right breast superior, inferior, and medial margins  DISPOSITION OF SPECIMEN:  PATHOLOGY  COUNTS:  YES  TOURNIQUET:  * No tourniquets in log *  DICTATION: .Dragon Dictation   After informed consent was obtained the patient was brought to the operating room and placed in the supine position on the operating table.  After adequate induction of general anesthesia the patient's right breast was prepped with ChloraPrep, allowed to dry, and draped in usual sterile manner.  An appropriate timeout was performed.  The area around the previous incision was infiltrated with quarter percent Marcaine.  The previous incision was opened with a 15 blade knife.  The previous lumpectomy cavity was reopened bluntly with finger dissection.  A small seroma was evacuated.  I then reexcised a large section from the superior edge of the cavity.  This was marked with a stitch on the new true surgical margin.  The medial and inferior edges were close on the last reexcision but clean.  Because they were readily available we did reexcise small edges from the medial and inferior edges well.  These were also marked with stitches on the new true surgical margin.  Hemostasis was achieved using the Bovie electrocautery.  The wound was irrigated with saline and infiltrated with quarter percent Marcaine.  The cavity was remarked with clips.  The deep layer of the wound was then closed with layers of  interrupted 3-0 Vicryl stitches.  The skin was closed with a running 4-0 Monocryl subcuticular stitch.  Dermabond dressings were applied.  The patient tolerated the procedure well.  At the end of the case all needle sponge and instrument counts were correct.  The patient was then awakened and taken to recovery in stable condition.  PLAN OF CARE: Discharge to home after PACU  PATIENT DISPOSITION:  PACU - hemodynamically stable.   Delay start of Pharmacological VTE agent (>24hrs) due to surgical blood loss or risk of bleeding: not applicable

## 2019-08-15 ENCOUNTER — Encounter: Payer: Self-pay | Admitting: Hematology and Oncology

## 2019-08-15 ENCOUNTER — Telehealth: Payer: Self-pay | Admitting: *Deleted

## 2019-08-15 ENCOUNTER — Other Ambulatory Visit: Payer: Self-pay | Admitting: *Deleted

## 2019-08-15 ENCOUNTER — Other Ambulatory Visit: Payer: Self-pay

## 2019-08-15 ENCOUNTER — Inpatient Hospital Stay: Payer: 59 | Attending: Oncology

## 2019-08-15 MED ORDER — VENLAFAXINE HCL ER 37.5 MG PO CP24: 37.5000 mg | ORAL_CAPSULE | Freq: Every day | ORAL | 1 refills | Status: DC

## 2019-08-15 NOTE — Telephone Encounter (Signed)
Received call back from patient.  Patient was asking about her appointments and the need for them to be changed.  Explained that I have sent a message to have her 1st cycle chemo moved from 2/15 to 2/26 and that someone from scheduling should call her with new appointments.  She also asked about getting some medication for hot flashes. Informed her we could call in Effexor XR 37.5mg  and instructed her to try 1 capsule daily to start with and if she needed she could increase to 75mg . Patient verbalized understanding. Verified pharmacy and sent.

## 2019-08-15 NOTE — Progress Notes (Signed)
Called pt to introduce myself as her Arboriculturist and to discuss copay assistance.  Pt gave me consent to apply in her behalf so I enrolled her in the [place] program forNeulastafor $10,000 per calendar yearfrom2/8/21.  Pt will pay $0 for herfirstdose or cycleand $29for each subsequent dose or cycle.  Pt exceeds the income requirement for the [employer] so she doesn't qualify at this time.  I will give her my card at her next visit for any questions or concerns she may have in the future.

## 2019-08-16 ENCOUNTER — Encounter: Payer: Self-pay | Admitting: *Deleted

## 2019-08-16 ENCOUNTER — Telehealth: Payer: Self-pay | Admitting: Hematology and Oncology

## 2019-08-16 LAB — SURGICAL PATHOLOGY

## 2019-08-16 NOTE — Telephone Encounter (Signed)
R/s appt per 2/04 sch message- pt aware of new appt dates and times

## 2019-08-17 ENCOUNTER — Ambulatory Visit: Payer: Self-pay | Admitting: General Surgery

## 2019-08-18 ENCOUNTER — Other Ambulatory Visit: Payer: Self-pay | Admitting: General Surgery

## 2019-08-18 ENCOUNTER — Other Ambulatory Visit (HOSPITAL_COMMUNITY): Payer: Self-pay | Admitting: General Surgery

## 2019-08-18 ENCOUNTER — Encounter: Payer: Self-pay | Admitting: *Deleted

## 2019-08-18 DIAGNOSIS — Z170 Estrogen receptor positive status [ER+]: Secondary | ICD-10-CM

## 2019-08-18 DIAGNOSIS — C50411 Malignant neoplasm of upper-outer quadrant of right female breast: Secondary | ICD-10-CM

## 2019-08-22 ENCOUNTER — Other Ambulatory Visit: Payer: No Typology Code available for payment source

## 2019-08-22 ENCOUNTER — Ambulatory Visit: Payer: No Typology Code available for payment source | Admitting: Adult Health

## 2019-08-22 ENCOUNTER — Encounter: Payer: Self-pay | Admitting: *Deleted

## 2019-08-22 ENCOUNTER — Inpatient Hospital Stay: Payer: 59

## 2019-08-23 ENCOUNTER — Ambulatory Visit (INDEPENDENT_AMBULATORY_CARE_PROVIDER_SITE_OTHER): Payer: No Typology Code available for payment source | Admitting: Plastic Surgery

## 2019-08-23 ENCOUNTER — Other Ambulatory Visit: Payer: Self-pay

## 2019-08-23 ENCOUNTER — Encounter: Payer: Self-pay | Admitting: Plastic Surgery

## 2019-08-23 ENCOUNTER — Telehealth: Payer: Self-pay | Admitting: Hematology and Oncology

## 2019-08-23 VITALS — BP 120/79 | HR 98 | Temp 97.9°F | Ht 63.0 in | Wt 212.0 lb

## 2019-08-23 DIAGNOSIS — Z170 Estrogen receptor positive status [ER+]: Secondary | ICD-10-CM

## 2019-08-23 DIAGNOSIS — C50411 Malignant neoplasm of upper-outer quadrant of right female breast: Secondary | ICD-10-CM | POA: Diagnosis not present

## 2019-08-23 NOTE — Progress Notes (Signed)
Patient ID: Wendy [name], female    DOB: 12-05-1955, 64 y.o.   MRN: [id number]   Chief Complaint  Patient presents with  . Breast Cancer    The patient is a 64 year old black female here with her husband for consultation for breast reconstruction.  She was diagnosed with right-sided breast cancer.  She has had 2 partial mastectomies with positive margins.  The breast cancer was high-grade DCIS with 1 out of 3 positive right axillary lymph nodes.  It was estrogen and progesterone positive, Ki-67 20% and HER-2 negative.  The decision has been made that she will have a right-sided mastectomy.  Her port was placed on the left side week ago.  She is planning on chemotherapy that will take about 4 months.  She is also planning on radiation.  She has a little bit of breast asymmetry with the right side being a little bit smaller.  She is got grade 3 ptosis and bilateral breasts.  She is 5 feet 3 inches tall and weighs 212 pounds.  Her preoperative bra is a 38D.  She is otherwise in very good health and does not have any chronic health conditions.  She had 2 children and one living.    Review of Systems  Constitutional: Negative for activity change.  HENT: Negative.   Eyes: Negative.   Respiratory: Negative for chest tightness and shortness of breath.   Cardiovascular: Negative for leg swelling.  Gastrointestinal: Negative for abdominal pain.  Endocrine: Negative.   Genitourinary: Negative.   Musculoskeletal: Negative for gait problem.  Neurological: Negative.   Hematological: Negative.   Psychiatric/Behavioral: Negative.     Past Medical History:  Diagnosis Date  . Breast cancer ([place])   . Colon cancer ([place])   . DDD (degenerative disc disease), cervical   . DDD (degenerative disc disease), lumbar   . ESOPHAGITIS, REFLUX   . Glaucoma   . HELICOBACTER PYLORI INFECTION   . HEMORRHOIDS, INTERNAL   . History of colon cancer   . History of colon polyps 2019  . Hyperlipidemia   .  Radius fracture 2006   Left, Distal  . Thoracic spondylosis   . Tinnitus, bilateral 2013    Past Surgical History:  Procedure Laterality Date  . BREAST LUMPECTOMY WITH RADIOACTIVE SEED AND SENTINEL LYMPH NODE BIOPSY Right 06/27/2019   Procedure: RIGHT BREAST LUMPECTOMY WITH RADIOACTIVE SEED AND SENTINEL LYMPH NODE BIOPSY AND RIGHT TARGETED NODE DISSECTION;  Surgeon: Jovita Kussmaul, MD;  Location: [facility];  Service: General;  Laterality: Right;  . CESAREAN SECTION  1989  . COLONOSCOPY  2019  . HYSTEROSCOPY WITH D & C N/A 09/17/2018   Procedure: DILATATION AND CURETTAGE Marlene Bast;  Surgeon: Brien Few, MD;  Location: [facility];  Service: Gynecology;  Laterality: N/A;  . PORTACATH PLACEMENT Left 08/01/2019   Procedure: INSERTION PORT-A-CATH;  Surgeon: Jovita Kussmaul, MD;  Location: [place];  Service: General;  Laterality: Left;  . RE-EXCISION OF BREAST CANCER,SUPERIOR MARGINS Right 08/12/2019   Procedure: RE-EXCISION OF RIGHT BREAST,SUPERIOR MARGINS;  Surgeon: Jovita Kussmaul, MD;  Location: [facility];  Service: General;  Laterality: Right;  . RE-EXCISION OF BREAST LUMPECTOMY Right 08/01/2019   Procedure: RE-EXCISION OF RIGHT BREAST CANCER  MARGINS;  Surgeon: Jovita Kussmaul, MD;  Location: [facility];  Service: General;  Laterality: Right;  . TONSILLECTOMY     age 60 years old  . WRIST FRACTURE SURGERY  2006  Current Outpatient Medications:  .  dexamethasone (DECADRON) 4 MG tablet, Take 1 tablet (4 mg total) by mouth daily. Takes 1 tab day before chemo and 1 tab day after chemo with food., Disp: 8 tablet, Rfl: 0 .  dorzolamide-timolol (COSOPT) 22.3-6.8 MG/ML ophthalmic solution, PLACE 1 DROP INTO BOTH EYES 2 (TWO) TIMES DAILY., Disp: , Rfl:  .  ibuprofen (ADVIL) 600 MG tablet, Take 1 tablet (600 mg total) by mouth every 6 (six) hours as needed., Disp: 30 tablet, Rfl: 0 .  latanoprost (XALATAN)  0.005 % ophthalmic solution, 1 drop at bedtime., Disp: , Rfl:  .  lidocaine-prilocaine (EMLA) cream, Apply to affected area once, Disp: 30 g, Rfl: 3 .  LORazepam (ATIVAN) 0.5 MG tablet, Take 1 tablet (0.5 mg total) by mouth at bedtime as needed for sleep., Disp: 30 tablet, Rfl: 0 .  Multiple Vitamin (MULTIVITAMIN) tablet, Take 1 tablet by mouth., Disp: , Rfl:  .  ondansetron (ZOFRAN) 8 MG tablet, Take 1 tablet (8 mg total) by mouth 2 (two) times daily as needed for refractory nausea / vomiting. Start on day 3 after chemo., Disp: 30 tablet, Rfl: 1 .  oxyCODONE-acetaminophen (PERCOCET) 5-325 MG tablet, Take 1 tablet by mouth every 6 (six) hours as needed for severe pain., Disp: 15 tablet, Rfl: 0 .  oxyCODONE-acetaminophen (PERCOCET) 5-325 MG tablet, Take 1 tablet by mouth every 6 (six) hours as needed for severe pain., Disp: 15 tablet, Rfl: 0 .  prochlorperazine (COMPAZINE) 10 MG tablet, Take 1 tablet (10 mg total) by mouth every 6 (six) hours as needed (Nausea or vomiting)., Disp: 30 tablet, Rfl: 1 .  traMADol (ULTRAM) 50 MG tablet, Take 1-2 tablets (50-100 mg total) by mouth every 6 (six) hours as needed., Disp: 20 tablet, Rfl: 1 .  venlafaxine XR (EFFEXOR-XR) 37.5 MG 24 hr capsule, Take 1 capsule (37.5 mg total) by mouth daily with breakfast. May increase to 2 capsules daily if needed, Disp: 60 capsule, Rfl: 1   Objective:   Vitals:   08/23/19 1539  BP: 120/79  Pulse: 98  Temp: 97.9 F (36.6 C)  SpO2: 99%    Physical Exam Vitals and nursing note reviewed.  Constitutional:      Appearance: Normal appearance.  HENT:     Head: Normocephalic and atraumatic.  Cardiovascular:     Rate and Rhythm: Normal rate.     Pulses: Normal pulses.  Pulmonary:     Effort: Pulmonary effort is normal. No respiratory distress.  Abdominal:     General: Abdomen is flat. There is no distension.     Tenderness: There is no abdominal tenderness.  Skin:    General: Skin is warm.     Capillary Refill:  Capillary refill takes less than 2 seconds.  Neurological:     General: No focal deficit present.     Mental Status: She is alert and oriented to person, place, and time.  Psychiatric:        Mood and Affect: Mood normal.        Behavior: Behavior normal.        Thought Content: Thought content normal.     Assessment & Plan:  Malignant neoplasm of upper-outer quadrant of right breast in female, estrogen receptor positive ([place])  We had a detailed conversation about the patient's options for breast reconstruction. Several reconstruction options were explained to the patient.  It is important to remember that breast reconstruction is an optional procedure. Reconstruction often requires several stages of surgery and this  means more than one operation.  The surgeries are often done several months apart.  The entire process from start to finish can take a year or more. The major goal of breast reconstruction is to look normal in clothing. There will always be scars and a difference noticeable without clothes.  This is true for asymmetries where both breasts will not be identical.  Surgery may be needed or desired to the non-cancerous breast in order to achieve better symmetry and satisfactory results.  Regardless of the reconstructive method, there is always risks and the possibility that the procedure will fail or have complications.  This couls required additional surgeries.    We discussed the available methods of breast reconstruction and included:  1. Tissue expander with Acellular dermal matrix followed by implant based reconstruction. This can be done as one surgery or multiple surgeries.  2. Autologous reconstruction can include using a muscle or tissue from another area of the body for the reconstruction.  3. Combined procedures like the latissismus dorsi flaps that often uses the muscle with an expander or implant.  For each of the method discussed the risks, benefits, scars and recovery  time were discussed in detail. Specific risks included bleeding, infection, hematoma, seroma, scarring, pain, wound healing complications, flap loss, fat necrosis, capsular contracture, need for implant removal, donor site complications, bulge, hernia, umbilical necrosis, need for urgent reoperation, and need for dressing changes.   After the options were discussed we focused on the patient's desires and the procedure that was best for her based on all the information.  A total of 50 minutes of face-to-face time was spent in this encounter, of which >50% was spent in counseling.    After hearing the options for reconstruction including delayed reconstruction the patient has decided on right immediate breast reconstruction with expander and Flex HD placement.  Due to the ptosis of her breasts we will likely not salvage her nipple areola.  She understands that the exchange would be approximately 2-1/2 to 3 months later.  We will try to do it as quickly as possible.  We would aim to get the exchange to her implant prior to her radiation.  The left breast mastopexy reduction would either be done at the time of the exchange or once the radiation was complete.  Pictures were obtained of the patient and placed in the chart with the patient's or guardian's permission.  I have reviewed her chart and notes.  And have placed a call to Dr. [name].  Wallace Going, DO   The 21st Century Cures Act was signed into law in 2016 which includes the topic of electronic health records.  This provides immediate access to information in MyChart.  This includes consultation notes, operative notes, office notes, lab results and pathology reports.  If you have any questions about what you read please let us know at your next visit or call us at the office.  We are right here with you.

## 2019-08-23 NOTE — Telephone Encounter (Signed)
Scheduled appt per 2/12 sch message - unable to reach pt . Left message with appt date and time

## 2019-08-24 ENCOUNTER — Encounter: Payer: Self-pay | Admitting: *Deleted

## 2019-08-24 ENCOUNTER — Ambulatory Visit (HOSPITAL_COMMUNITY)
Admission: RE | Admit: 2019-08-24 | Discharge: 2019-08-24 | Disposition: A | Discharge status: Home / Self Care | Payer: 59 | Source: Ambulatory Visit | Attending: General Surgery | Admitting: General Surgery

## 2019-08-24 DIAGNOSIS — C50411 Malignant neoplasm of upper-outer quadrant of right female breast: Secondary | ICD-10-CM

## 2019-08-24 DIAGNOSIS — Z170 Estrogen receptor positive status [ER+]: Secondary | ICD-10-CM | POA: Diagnosis present

## 2019-08-24 MED ORDER — GADOBUTROL 1 MMOL/ML IV SOLN
10.0000 mL | Freq: Once | INTRAVENOUS | Status: AC | PRN
  Administered 2019-08-24: 21:00:00 10 mL via INTRAVENOUS

## 2019-08-26 ENCOUNTER — Institutional Professional Consult (permissible substitution): Payer: No Typology Code available for payment source | Admitting: Plastic Surgery

## 2019-08-29 ENCOUNTER — Other Ambulatory Visit: Payer: No Typology Code available for payment source

## 2019-08-29 ENCOUNTER — Ambulatory Visit: Payer: No Typology Code available for payment source | Admitting: Hematology and Oncology

## 2019-08-29 NOTE — H&P (View-Only) (Signed)
ICD-10-CM   1. Malignant neoplasm of upper-outer quadrant of right breast in female, estrogen receptor positive [facility])  C50.411    Z17.0       Patient ID: Wendy [name], female    DOB: Jul 04, 1956, 64 y.o.   MRN: [id number]   History of Present Illness: Wendy [name] is a 64 y.o.  female  with a history of right side breast cancer.  She presents for preoperative evaluation for upcoming procedure, right breast mastectomy with Dr. [name] and immediate reconstruction with placement of tissue expander and Flex HD with Dr. Marla Roe, scheduled for 09/08/2019.  Summary from previous visit: Patient was diagnosed with right-sided breast cancer.  Had 2 partial mastectomies with positive margins.  Breast cancer was high-grade DCIS with 1 out of 3 positive right axillary lymph nodes, ERPR+, Ki-67 20% and HER-2 negative.  2 weeks ago her port was placed on the left side.  She will have chemotherapy for approximately 4 months and will also have radiation.  She has grade 3 ptosis bilaterally.  She is 5 feet 3 inches tall and weighs 212 pounds.  Her preoperative bra size is 38 D.  She is interested in a left breast mastopexy reduction to be done either at the time of exchange or once the radiation is complete. Port-A-Cath for chemotherapy. Will also receive radiation with timing TBD.  PMH Significant for: Breast cancer, colon cancer, degenerative disc disease, and hyperlipidemia.  The patient has not had problems with anesthesia.   Past Medical History: Allergies: Allergies  Allergen Reactions  . Vicodin [Hydrocodone-Acetaminophen] Itching    SEVERE   . Brimonidine Rash    Current Medications:  Current Outpatient Medications:  .  cephALEXin (KEFLEX) 500 MG capsule, Take 1 capsule (500 mg total) by mouth 4 (four) times daily for 3 days., Disp: 12 capsule, Rfl: 0 .  dexamethasone (DECADRON) 4 MG tablet, Take 1 tablet (4 mg total) by mouth daily. Takes 1 tab day before chemo and 1 tab day after  chemo with food., Disp: 8 tablet, Rfl: 0 .  diazepam (VALIUM) 2 MG tablet, Take 1 tablet (2 mg total) by mouth every 12 (twelve) hours as needed for muscle spasms., Disp: 20 tablet, Rfl: 0 .  dorzolamide-timolol (COSOPT) 22.3-6.8 MG/ML ophthalmic solution, PLACE 1 DROP INTO BOTH EYES 2 (TWO) TIMES DAILY., Disp: , Rfl:  .  HYDROcodone-acetaminophen (NORCO) 5-325 MG tablet, Take 1 tablet by mouth every 8 (eight) hours as needed for up to 5 days for severe pain., Disp: 15 tablet, Rfl: 0 .  ibuprofen (ADVIL) 600 MG tablet, Take 1 tablet (600 mg total) by mouth every 6 (six) hours as needed., Disp: 30 tablet, Rfl: 0 .  latanoprost (XALATAN) 0.005 % ophthalmic solution, 1 drop at bedtime., Disp: , Rfl:  .  lidocaine-prilocaine (EMLA) cream, Apply to affected area once, Disp: 30 g, Rfl: 3 .  LORazepam (ATIVAN) 0.5 MG tablet, Take 1 tablet (0.5 mg total) by mouth at bedtime as needed for sleep., Disp: 30 tablet, Rfl: 0 .  ondansetron (ZOFRAN) 4 MG tablet, Take 1 tablet (4 mg total) by mouth every 8 (eight) hours as needed for nausea or vomiting., Disp: 20 tablet, Rfl: 0 .  ondansetron (ZOFRAN) 8 MG tablet, Take 1 tablet (8 mg total) by mouth 2 (two) times daily as needed for refractory nausea / vomiting. Start on day 3 after chemo., Disp: 30 tablet, Rfl: 1 .  oxyCODONE-acetaminophen (PERCOCET) 5-325 MG tablet, Take 1 tablet by mouth every  6 (six) hours as needed for severe pain., Disp: 15 tablet, Rfl: 0 .  oxyCODONE-acetaminophen (PERCOCET) 5-325 MG tablet, Take 1 tablet by mouth every 6 (six) hours as needed for severe pain., Disp: 15 tablet, Rfl: 0 .  prochlorperazine (COMPAZINE) 10 MG tablet, Take 1 tablet (10 mg total) by mouth every 6 (six) hours as needed (Nausea or vomiting)., Disp: 30 tablet, Rfl: 1 .  traMADol (ULTRAM) 50 MG tablet, Take 1-2 tablets (50-100 mg total) by mouth every 6 (six) hours as needed., Disp: 20 tablet, Rfl: 1 .  venlafaxine XR (EFFEXOR-XR) 37.5 MG 24 hr capsule, Take 1 capsule  (37.5 mg total) by mouth daily with breakfast. May increase to 2 capsules daily if needed, Disp: 60 capsule, Rfl: 1  Past Medical Problems: Past Medical History:  Diagnosis Date  . Breast cancer (HCC)   . Colon cancer (HCC)   . DDD (degenerative disc disease), cervical   . DDD (degenerative disc disease), lumbar   . ESOPHAGITIS, REFLUX   . Glaucoma   . HELICOBACTER PYLORI INFECTION   . HEMORRHOIDS, INTERNAL   . History of colon cancer   . History of colon polyps 2019  . Hyperlipidemia   . Radius fracture 2006   Left, Distal  . Thoracic spondylosis   . Tinnitus, bilateral 2013    Past Surgical History: Past Surgical History:  Procedure Laterality Date  . BREAST LUMPECTOMY WITH RADIOACTIVE SEED AND SENTINEL LYMPH NODE BIOPSY Right 06/27/2019   Procedure: RIGHT BREAST LUMPECTOMY WITH RADIOACTIVE SEED AND SENTINEL LYMPH NODE BIOPSY AND RIGHT TARGETED NODE DISSECTION;  Surgeon: Toth, Paul III, MD;  Location: [facility];  Service: General;  Laterality: Right;  . CESAREAN SECTION  1989  . COLONOSCOPY  2019  . HYSTEROSCOPY WITH D & C N/A 09/17/2018   Procedure: DILATATION AND CURETTAGE /HYSTEROSCOPY MYOSURE;  Surgeon: Taavon, Richard, MD;  Location: [facility];  Service: Gynecology;  Laterality: N/A;  . PORTACATH PLACEMENT Left 08/01/2019   Procedure: INSERTION PORT-A-CATH;  Surgeon: Toth, Paul III, MD;  Location: [facility];  Service: General;  Laterality: Left;  . RE-EXCISION OF BREAST CANCER,SUPERIOR MARGINS Right 08/12/2019   Procedure: RE-EXCISION OF RIGHT BREAST,SUPERIOR MARGINS;  Surgeon: Toth, Paul III, MD;  Location: [facility];  Service: General;  Laterality: Right;  . RE-EXCISION OF BREAST LUMPECTOMY Right 08/01/2019   Procedure: RE-EXCISION OF RIGHT BREAST CANCER  MARGINS;  Surgeon: Toth, Paul III, MD;  Location: [facility];  Service: General;  Laterality: Right;  . TONSILLECTOMY     age 22 years old  .  WRIST FRACTURE SURGERY  2006    Social History: Social History   Socioeconomic History  . Marital status: Significant Other    Spouse name: Not on file  . Number of children: Not on file  . Years of education: Not on file  . Highest education level: Not on file  Occupational History  . Occupation: medical    Employer: [employer]  Tobacco Use  . Smoking status: Never Smoker  . Smokeless tobacco: Never Used  Substance and Sexual Activity  . Alcohol use: Yes    Comment: rarely  . Drug use: No  . Sexual activity: Not Currently  Other Topics Concern  . Not on file  Social History Narrative  . Not on file   Social Determinants of Health   Financial Resource Strain:   . Difficulty of Paying Living Expenses: Not on file  Food Insecurity:   .   Worried About Charity fundraiser in the Last Year: Not on file  . Ran Out of Food in the Last Year: Not on file  Transportation Needs:   . Lack of Transportation (Medical): Not on file  . Lack of Transportation (Non-Medical): Not on file  Physical Activity:   . Days of Exercise per Week: Not on file  . Minutes of Exercise per Session: Not on file  Stress:   . Feeling of Stress : Not on file  Social Connections:   . Frequency of Communication with Friends and Family: Not on file  . Frequency of Social Gatherings with Friends and Family: Not on file  . Attends Religious Services: Not on file  . Active Member of Clubs or Organizations: Not on file  . Attends Archivist Meetings: Not on file  . Marital Status: Not on file  Intimate Partner Violence:   . Fear of Current or Ex-Partner: Not on file  . Emotionally Abused: Not on file  . Physically Abused: Not on file  . Sexually Abused: Not on file    Family History: Family History  Problem Relation Age of Onset  . Diabetes Mother   . Brain cancer Mother        mets  . Cancer Mother        breast-right  . Breast cancer Mother        d. 73  . Diabetes  Father   . Hypertension Father   . Cancer Father        lung  . Heart disease Father   . Lung cancer Father   . Diabetes Sister   . Hypertension Sister   . Glaucoma Sister   . Diabetes Brother   . Hypertension Brother   . Heart disease Brother        congestive heart failure  . Other Son 16       car accident  . Colon cancer Maternal Grandmother 39       d. 34  . Dementia Paternal Grandfather   . Breast cancer Other        MGMs mother  . Other Maternal Uncle        possibly drowning  . Brain cancer Niece 1       d. 2 years    Review of Systems: Review of Systems  Constitutional: Negative for chills and fever.  HENT: Negative for congestion and sore throat.   Respiratory: Negative for cough and shortness of breath.   Cardiovascular: Negative for chest pain and palpitations.  Gastrointestinal: Negative for abdominal pain, nausea and vomiting.  Musculoskeletal: Negative for back pain, joint pain, myalgias and neck pain.  Skin: Negative for itching and rash.    Physical Exam: Vital Signs BP 135/83 (BP Location: Left Arm, Patient Position: Sitting, Cuff Size: Large)   Pulse 83   Temp 97.9 F (36.6 C) (Temporal)   Ht 5' 3" (1.6 m)   Wt 212 lb (96.2 kg)   SpO2 98%   BMI 37.55 kg/m  Physical Exam Constitutional:      Appearance: Normal appearance. She is normal weight.  HENT:     Head: Normocephalic and atraumatic.  Eyes:     Extraocular Movements: Extraocular movements intact.  Cardiovascular:     Rate and Rhythm: Normal rate and regular rhythm.     Pulses: Normal pulses.     Heart sounds: Normal heart sounds.  Pulmonary:     Effort: Pulmonary effort is normal.  Breath sounds: Normal breath sounds. No wheezing, rhonchi or rales.  Abdominal:     General: Bowel sounds are normal.     Palpations: Abdomen is soft.  Musculoskeletal:        General: No swelling. Normal range of motion.     Cervical back: Normal range of motion.  Skin:    General: Skin is  warm and dry.     Coloration: Skin is not pale.     Findings: No erythema or rash.  Neurological:     General: No focal deficit present.     Mental Status: She is alert and oriented to person, place, and time.  Psychiatric:        Mood and Affect: Mood normal.        Behavior: Behavior normal.        Thought Content: Thought content normal.        Judgment: Judgment normal.     Assessment/Plan:  Mrs. Lubeck scheduled for right breast mastectomy with Dr. [name] and immediate reconstruction with tissue expander placement and Flex HD with Dr. Marla Roe. Port-A-Cath in place for chemotherapy and will receive radiation in the future. Risks, benefits, and alternatives of procedure discussed, questions answered and consent obtained.    Smoking Status: Non-smoker Last Mammogram: MRI 08/24/19; Results: Malignancy in the upper outer quadrant of the right breast.  No evidence of left breast malignancy.  No enlarged or abnormal appearing lymph nodes bilaterally.  Caprini Score: 9 High; Risk Factors include: 64 year old female, history of cancer, Port-A-Cath for chemotherapy, BMI > 25, and length of planned surgery. Recommendation for mechanical and pharmacological prophylaxis during surgery. Encourage early ambulation.   Pictures obtained: 08/23/2019  Post-op Rx sent to pharmacy: ultram, zofran, keflex, valium.   Electronically signed by: Threasa Heads, PA-C 08/30/2019 4:16 PM

## 2019-08-29 NOTE — Progress Notes (Signed)
   ICD-10-CM   1. Malignant neoplasm of upper-outer quadrant of right breast in female, estrogen receptor positive (HCC)  C50.411    Z17.0       Patient ID: Wendy [name], female    DOB: 08/14/1955, 64 y.o.   MRN: [id number]   History of Present Illness: Wendy [name] is a 64 y.o.  female  with a history of right side breast cancer.  She presents for preoperative evaluation for upcoming procedure, right breast mastectomy with Dr. Toth and immediate reconstruction with placement of tissue expander and Flex HD with Dr. Dillingham, scheduled for 09/08/2019.  Summary from previous visit: Patient was diagnosed with right-sided breast cancer.  Had 2 partial mastectomies with positive margins.  Breast cancer was high-grade DCIS with 1 out of 3 positive right axillary lymph nodes, ERPR+, Ki-67 20% and HER-2 negative.  2 weeks ago her port was placed on the left side.  She will have chemotherapy for approximately 4 months and will also have radiation.  She has grade 3 ptosis bilaterally.  She is 5 feet 3 inches tall and weighs 212 pounds.  Her preoperative bra size is 38 D.  She is interested in a left breast mastopexy reduction to be done either at the time of exchange or once the radiation is complete. Port-A-Cath for chemotherapy. Will also receive radiation with timing TBD.  PMH Significant for: Breast cancer, colon cancer, degenerative disc disease, and hyperlipidemia.  The patient has not had problems with anesthesia.   Past Medical History: Allergies: Allergies  Allergen Reactions  . Vicodin [Hydrocodone-Acetaminophen] Itching    SEVERE   . Brimonidine Rash    Current Medications:  Current Outpatient Medications:  .  cephALEXin (KEFLEX) 500 MG capsule, Take 1 capsule (500 mg total) by mouth 4 (four) times daily for 3 days., Disp: 12 capsule, Rfl: 0 .  dexamethasone (DECADRON) 4 MG tablet, Take 1 tablet (4 mg total) by mouth daily. Takes 1 tab day before chemo and 1 tab day after  chemo with food., Disp: 8 tablet, Rfl: 0 .  diazepam (VALIUM) 2 MG tablet, Take 1 tablet (2 mg total) by mouth every 12 (twelve) hours as needed for muscle spasms., Disp: 20 tablet, Rfl: 0 .  dorzolamide-timolol (COSOPT) 22.3-6.8 MG/ML ophthalmic solution, PLACE 1 DROP INTO BOTH EYES 2 (TWO) TIMES DAILY., Disp: , Rfl:  .  HYDROcodone-acetaminophen (NORCO) 5-325 MG tablet, Take 1 tablet by mouth every 8 (eight) hours as needed for up to 5 days for severe pain., Disp: 15 tablet, Rfl: 0 .  ibuprofen (ADVIL) 600 MG tablet, Take 1 tablet (600 mg total) by mouth every 6 (six) hours as needed., Disp: 30 tablet, Rfl: 0 .  latanoprost (XALATAN) 0.005 % ophthalmic solution, 1 drop at bedtime., Disp: , Rfl:  .  lidocaine-prilocaine (EMLA) cream, Apply to affected area once, Disp: 30 g, Rfl: 3 .  LORazepam (ATIVAN) 0.5 MG tablet, Take 1 tablet (0.5 mg total) by mouth at bedtime as needed for sleep., Disp: 30 tablet, Rfl: 0 .  ondansetron (ZOFRAN) 4 MG tablet, Take 1 tablet (4 mg total) by mouth every 8 (eight) hours as needed for nausea or vomiting., Disp: 20 tablet, Rfl: 0 .  ondansetron (ZOFRAN) 8 MG tablet, Take 1 tablet (8 mg total) by mouth 2 (two) times daily as needed for refractory nausea / vomiting. Start on day 3 after chemo., Disp: 30 tablet, Rfl: 1 .  oxyCODONE-acetaminophen (PERCOCET) 5-325 MG tablet, Take 1 tablet by mouth every   6 (six) hours as needed for severe pain., Disp: 15 tablet, Rfl: 0 .  oxyCODONE-acetaminophen (PERCOCET) 5-325 MG tablet, Take 1 tablet by mouth every 6 (six) hours as needed for severe pain., Disp: 15 tablet, Rfl: 0 .  prochlorperazine (COMPAZINE) 10 MG tablet, Take 1 tablet (10 mg total) by mouth every 6 (six) hours as needed (Nausea or vomiting)., Disp: 30 tablet, Rfl: 1 .  traMADol (ULTRAM) 50 MG tablet, Take 1-2 tablets (50-100 mg total) by mouth every 6 (six) hours as needed., Disp: 20 tablet, Rfl: 1 .  venlafaxine XR (EFFEXOR-XR) 37.5 MG 24 hr capsule, Take 1 capsule  (37.5 mg total) by mouth daily with breakfast. May increase to 2 capsules daily if needed, Disp: 60 capsule, Rfl: 1  Past Medical Problems: Past Medical History:  Diagnosis Date  . Breast cancer (HCC)   . Colon cancer (HCC)   . DDD (degenerative disc disease), cervical   . DDD (degenerative disc disease), lumbar   . ESOPHAGITIS, REFLUX   . Glaucoma   . HELICOBACTER PYLORI INFECTION   . HEMORRHOIDS, INTERNAL   . History of colon cancer   . History of colon polyps 2019  . Hyperlipidemia   . Radius fracture 2006   Left, Distal  . Thoracic spondylosis   . Tinnitus, bilateral 2013    Past Surgical History: Past Surgical History:  Procedure Laterality Date  . BREAST LUMPECTOMY WITH RADIOACTIVE SEED AND SENTINEL LYMPH NODE BIOPSY Right 06/27/2019   Procedure: RIGHT BREAST LUMPECTOMY WITH RADIOACTIVE SEED AND SENTINEL LYMPH NODE BIOPSY AND RIGHT TARGETED NODE DISSECTION;  Surgeon: Toth, Paul III, MD;  Location: [facility];  Service: General;  Laterality: Right;  . CESAREAN SECTION  1989  . COLONOSCOPY  2019  . HYSTEROSCOPY WITH D & C N/A 09/17/2018   Procedure: DILATATION AND CURETTAGE /HYSTEROSCOPY MYOSURE;  Surgeon: Taavon, Richard, MD;  Location: [facility];  Service: Gynecology;  Laterality: N/A;  . PORTACATH PLACEMENT Left 08/01/2019   Procedure: INSERTION PORT-A-CATH;  Surgeon: Toth, Paul III, MD;  Location: [facility];  Service: General;  Laterality: Left;  . RE-EXCISION OF BREAST CANCER,SUPERIOR MARGINS Right 08/12/2019   Procedure: RE-EXCISION OF RIGHT BREAST,SUPERIOR MARGINS;  Surgeon: Toth, Paul III, MD;  Location: [facility];  Service: General;  Laterality: Right;  . RE-EXCISION OF BREAST LUMPECTOMY Right 08/01/2019   Procedure: RE-EXCISION OF RIGHT BREAST CANCER  MARGINS;  Surgeon: Toth, Paul III, MD;  Location: [facility];  Service: General;  Laterality: Right;  . TONSILLECTOMY     age 22 years old  .  WRIST FRACTURE SURGERY  2006    Social History: Social History   Socioeconomic History  . Marital status: Significant Other    Spouse name: Not on file  . Number of children: Not on file  . Years of education: Not on file  . Highest education level: Not on file  Occupational History  . Occupation: medical    Employer: [employer]  Tobacco Use  . Smoking status: Never Smoker  . Smokeless tobacco: Never Used  Substance and Sexual Activity  . Alcohol use: Yes    Comment: rarely  . Drug use: No  . Sexual activity: Not Currently  Other Topics Concern  . Not on file  Social History Narrative  . Not on file   Social Determinants of Health   Financial Resource Strain:   . Difficulty of Paying Living Expenses: Not on file  Food Insecurity:   .   Worried About Running Out of Food in the Last Year: Not on file  . Ran Out of Food in the Last Year: Not on file  Transportation Needs:   . Lack of Transportation (Medical): Not on file  . Lack of Transportation (Non-Medical): Not on file  Physical Activity:   . Days of Exercise per Week: Not on file  . Minutes of Exercise per Session: Not on file  Stress:   . Feeling of Stress : Not on file  Social Connections:   . Frequency of Communication with Friends and Family: Not on file  . Frequency of Social Gatherings with Friends and Family: Not on file  . Attends Religious Services: Not on file  . Active Member of Clubs or Organizations: Not on file  . Attends Club or Organization Meetings: Not on file  . Marital Status: Not on file  Intimate Partner Violence:   . Fear of Current or Ex-Partner: Not on file  . Emotionally Abused: Not on file  . Physically Abused: Not on file  . Sexually Abused: Not on file    Family History: Family History  Problem Relation Age of Onset  . Diabetes Mother   . Brain cancer Mother        mets  . Cancer Mother        breast-right  . Breast cancer Mother        d. 57  . Diabetes  Father   . Hypertension Father   . Cancer Father        lung  . Heart disease Father   . Lung cancer Father   . Diabetes Sister   . Hypertension Sister   . Glaucoma Sister   . Diabetes Brother   . Hypertension Brother   . Heart disease Brother        congestive heart failure  . Other Son 16       car accident  . Colon cancer Maternal Grandmother 53       d. 58  . Dementia Paternal Grandfather   . Breast cancer Other        MGMs mother  . Other Maternal Uncle        possibly drowning  . Brain cancer Niece 1       d. 2 years    Review of Systems: Review of Systems  Constitutional: Negative for chills and fever.  HENT: Negative for congestion and sore throat.   Respiratory: Negative for cough and shortness of breath.   Cardiovascular: Negative for chest pain and palpitations.  Gastrointestinal: Negative for abdominal pain, nausea and vomiting.  Musculoskeletal: Negative for back pain, joint pain, myalgias and neck pain.  Skin: Negative for itching and rash.    Physical Exam: Vital Signs BP 135/83 (BP Location: Left Arm, Patient Position: Sitting, Cuff Size: Large)   Pulse 83   Temp 97.9 F (36.6 C) (Temporal)   Ht 5' 3" (1.6 m)   Wt 212 lb (96.2 kg)   SpO2 98%   BMI 37.55 kg/m  Physical Exam Constitutional:      Appearance: Normal appearance. She is normal weight.  HENT:     Head: Normocephalic and atraumatic.  Eyes:     Extraocular Movements: Extraocular movements intact.  Cardiovascular:     Rate and Rhythm: Normal rate and regular rhythm.     Pulses: Normal pulses.     Heart sounds: Normal heart sounds.  Pulmonary:     Effort: Pulmonary effort is normal.       Breath sounds: Normal breath sounds. No wheezing, rhonchi or rales.  Abdominal:     General: Bowel sounds are normal.     Palpations: Abdomen is soft.  Musculoskeletal:        General: No swelling. Normal range of motion.     Cervical back: Normal range of motion.  Skin:    General: Skin is  warm and dry.     Coloration: Skin is not pale.     Findings: No erythema or rash.  Neurological:     General: No focal deficit present.     Mental Status: She is alert and oriented to person, place, and time.  Psychiatric:        Mood and Affect: Mood normal.        Behavior: Behavior normal.        Thought Content: Thought content normal.        Judgment: Judgment normal.     Assessment/Plan:  Mrs. Gironda scheduled for right breast mastectomy with Dr. Toth and immediate reconstruction with tissue expander placement and Flex HD with Dr. Dillingham. Port-A-Cath in place for chemotherapy and will receive radiation in the future. Risks, benefits, and alternatives of procedure discussed, questions answered and consent obtained.    Smoking Status: Non-smoker Last Mammogram: MRI 08/24/19; Results: Malignancy in the upper outer quadrant of the right breast.  No evidence of left breast malignancy.  No enlarged or abnormal appearing lymph nodes bilaterally.  Caprini Score: 9 High; Risk Factors include: 63-year-old female, history of cancer, Port-A-Cath for chemotherapy, BMI > 25, and length of planned surgery. Recommendation for mechanical and pharmacological prophylaxis during surgery. Encourage early ambulation.   Pictures obtained: 08/23/2019  Post-op Rx sent to pharmacy: ultram, zofran, keflex, valium.   Electronically signed by: Jasan Doughtie C Yovani Cogburn, PA-C 08/30/2019 4:16 PM          

## 2019-08-30 ENCOUNTER — Other Ambulatory Visit: Payer: Self-pay

## 2019-08-30 ENCOUNTER — Encounter: Payer: Self-pay | Admitting: Plastic Surgery

## 2019-08-30 ENCOUNTER — Ambulatory Visit (INDEPENDENT_AMBULATORY_CARE_PROVIDER_SITE_OTHER): Payer: 59 | Admitting: Plastic Surgery

## 2019-08-30 VITALS — BP 135/83 | HR 83 | Temp 97.9°F | Ht 63.0 in | Wt 212.0 lb

## 2019-08-30 DIAGNOSIS — C50411 Malignant neoplasm of upper-outer quadrant of right female breast: Secondary | ICD-10-CM

## 2019-08-30 DIAGNOSIS — Z170 Estrogen receptor positive status [ER+]: Secondary | ICD-10-CM

## 2019-08-30 MED ORDER — HYDROCODONE-ACETAMINOPHEN 5-325 MG PO TABS: 1.0000 | ORAL_TABLET | Freq: Three times a day (TID) | ORAL | 0 refills | Status: AC | PRN

## 2019-08-30 MED ORDER — ONDANSETRON HCL 4 MG PO TABS: 4.0000 mg | ORAL_TABLET | Freq: Three times a day (TID) | ORAL | 0 refills | Status: DC | PRN

## 2019-08-30 MED ORDER — CEPHALEXIN 500 MG PO CAPS: 500.0000 mg | ORAL_CAPSULE | Freq: Four times a day (QID) | ORAL | 0 refills | Status: AC

## 2019-08-30 MED ORDER — DIAZEPAM 2 MG PO TABS: 2.0000 mg | ORAL_TABLET | Freq: Two times a day (BID) | ORAL | 0 refills | Status: DC | PRN

## 2019-09-02 ENCOUNTER — Other Ambulatory Visit: Payer: No Typology Code available for payment source

## 2019-09-02 ENCOUNTER — Ambulatory Visit: Payer: No Typology Code available for payment source | Admitting: Adult Health

## 2019-09-02 ENCOUNTER — Ambulatory Visit: Payer: No Typology Code available for payment source

## 2019-09-05 ENCOUNTER — Other Ambulatory Visit (HOSPITAL_COMMUNITY)
Admission: RE | Admit: 2019-09-05 | Discharge: 2019-09-05 | Disposition: A | Discharge status: Home / Self Care | Payer: 59 | Source: Ambulatory Visit | Attending: General Surgery | Admitting: General Surgery

## 2019-09-05 ENCOUNTER — Encounter (HOSPITAL_BASED_OUTPATIENT_CLINIC_OR_DEPARTMENT_OTHER): Payer: Self-pay | Admitting: General Surgery

## 2019-09-05 ENCOUNTER — Other Ambulatory Visit: Payer: Self-pay

## 2019-09-05 DIAGNOSIS — Z01812 Encounter for preprocedural laboratory examination: Secondary | ICD-10-CM | POA: Diagnosis not present

## 2019-09-05 DIAGNOSIS — Z20822 Contact with and (suspected) exposure to covid-19: Secondary | ICD-10-CM | POA: Diagnosis not present

## 2019-09-05 NOTE — Progress Notes (Signed)

## 2019-09-06 ENCOUNTER — Telehealth: Payer: Self-pay | Admitting: Hematology and Oncology

## 2019-09-06 LAB — SARS CORONAVIRUS 2 (TAT 6-24 HRS): SARS Coronavirus 2: NEGATIVE

## 2019-09-06 NOTE — Telephone Encounter (Signed)
Scheduled appt per 3/2 sch message- unable to reach pt  - left message with apt date and time

## 2019-09-08 ENCOUNTER — Ambulatory Visit (HOSPITAL_BASED_OUTPATIENT_CLINIC_OR_DEPARTMENT_OTHER): Payer: No Typology Code available for payment source | Admitting: Anesthesiology

## 2019-09-08 ENCOUNTER — Encounter (HOSPITAL_BASED_OUTPATIENT_CLINIC_OR_DEPARTMENT_OTHER)
Admission: RE | Disposition: A | Discharge status: Home / Self Care | Payer: Self-pay | Source: Home / Self Care | Attending: Plastic Surgery

## 2019-09-08 ENCOUNTER — Encounter (HOSPITAL_BASED_OUTPATIENT_CLINIC_OR_DEPARTMENT_OTHER): Payer: Self-pay | Admitting: General Surgery

## 2019-09-08 ENCOUNTER — Other Ambulatory Visit: Payer: Self-pay

## 2019-09-08 ENCOUNTER — Observation Stay (HOSPITAL_BASED_OUTPATIENT_CLINIC_OR_DEPARTMENT_OTHER)
Admission: RE | Admit: 2019-09-08 | Discharge: 2019-09-09 | Disposition: A | Discharge status: Home / Self Care | Payer: No Typology Code available for payment source | Attending: Plastic Surgery | Admitting: Plastic Surgery

## 2019-09-08 DIAGNOSIS — Z791 Long term (current) use of non-steroidal anti-inflammatories (NSAID): Secondary | ICD-10-CM | POA: Insufficient documentation

## 2019-09-08 DIAGNOSIS — Z886 Allergy status to analgesic agent status: Secondary | ICD-10-CM | POA: Diagnosis not present

## 2019-09-08 DIAGNOSIS — E785 Hyperlipidemia, unspecified: Secondary | ICD-10-CM | POA: Insufficient documentation

## 2019-09-08 DIAGNOSIS — Z6837 Body mass index (BMI) 37.0-37.9, adult: Secondary | ICD-10-CM | POA: Insufficient documentation

## 2019-09-08 DIAGNOSIS — M5030 Other cervical disc degeneration, unspecified cervical region: Secondary | ICD-10-CM | POA: Insufficient documentation

## 2019-09-08 DIAGNOSIS — H9313 Tinnitus, bilateral: Secondary | ICD-10-CM | POA: Diagnosis not present

## 2019-09-08 DIAGNOSIS — K219 Gastro-esophageal reflux disease without esophagitis: Secondary | ICD-10-CM | POA: Insufficient documentation

## 2019-09-08 DIAGNOSIS — Z83511 Family history of glaucoma: Secondary | ICD-10-CM | POA: Insufficient documentation

## 2019-09-08 DIAGNOSIS — Z85038 Personal history of other malignant neoplasm of large intestine: Secondary | ICD-10-CM | POA: Insufficient documentation

## 2019-09-08 DIAGNOSIS — Z803 Family history of malignant neoplasm of breast: Secondary | ICD-10-CM | POA: Insufficient documentation

## 2019-09-08 DIAGNOSIS — Z800 Family history of malignant neoplasm of digestive organs: Secondary | ICD-10-CM | POA: Insufficient documentation

## 2019-09-08 DIAGNOSIS — C773 Secondary and unspecified malignant neoplasm of axilla and upper limb lymph nodes: Secondary | ICD-10-CM | POA: Diagnosis not present

## 2019-09-08 DIAGNOSIS — M5136 Other intervertebral disc degeneration, lumbar region: Secondary | ICD-10-CM | POA: Diagnosis not present

## 2019-09-08 DIAGNOSIS — Z801 Family history of malignant neoplasm of trachea, bronchus and lung: Secondary | ICD-10-CM | POA: Insufficient documentation

## 2019-09-08 DIAGNOSIS — H409 Unspecified glaucoma: Secondary | ICD-10-CM | POA: Diagnosis not present

## 2019-09-08 DIAGNOSIS — Z7952 Long term (current) use of systemic steroids: Secondary | ICD-10-CM | POA: Diagnosis not present

## 2019-09-08 DIAGNOSIS — Z833 Family history of diabetes mellitus: Secondary | ICD-10-CM | POA: Insufficient documentation

## 2019-09-08 DIAGNOSIS — Z79899 Other long term (current) drug therapy: Secondary | ICD-10-CM | POA: Diagnosis not present

## 2019-09-08 DIAGNOSIS — Z888 Allergy status to other drugs, medicaments and biological substances status: Secondary | ICD-10-CM | POA: Insufficient documentation

## 2019-09-08 DIAGNOSIS — Z885 Allergy status to narcotic agent status: Secondary | ICD-10-CM | POA: Insufficient documentation

## 2019-09-08 DIAGNOSIS — C50411 Malignant neoplasm of upper-outer quadrant of right female breast: Principal | ICD-10-CM | POA: Insufficient documentation

## 2019-09-08 DIAGNOSIS — Z8249 Family history of ischemic heart disease and other diseases of the circulatory system: Secondary | ICD-10-CM | POA: Insufficient documentation

## 2019-09-08 DIAGNOSIS — Z853 Personal history of malignant neoplasm of breast: Secondary | ICD-10-CM | POA: Diagnosis not present

## 2019-09-08 DIAGNOSIS — M47894 Other spondylosis, thoracic region: Secondary | ICD-10-CM | POA: Diagnosis not present

## 2019-09-08 DIAGNOSIS — Z170 Estrogen receptor positive status [ER+]: Secondary | ICD-10-CM

## 2019-09-08 DIAGNOSIS — Z808 Family history of malignant neoplasm of other organs or systems: Secondary | ICD-10-CM | POA: Insufficient documentation

## 2019-09-08 DIAGNOSIS — Z820 Family history of epilepsy and other diseases of the nervous system: Secondary | ICD-10-CM | POA: Insufficient documentation

## 2019-09-08 DIAGNOSIS — E669 Obesity, unspecified: Secondary | ICD-10-CM | POA: Insufficient documentation

## 2019-09-08 DIAGNOSIS — C50919 Malignant neoplasm of unspecified site of unspecified female breast: Secondary | ICD-10-CM | POA: Diagnosis present

## 2019-09-08 HISTORY — PX: BREAST RECONSTRUCTION WITH PLACEMENT OF TISSUE EXPANDER AND FLEX HD (ACELLULAR HYDRATED DERMIS): SHX6295

## 2019-09-08 HISTORY — PX: SIMPLE MASTECTOMY WITH AXILLARY SENTINEL NODE BIOPSY: SHX6098

## 2019-09-08 SURGERY — SIMPLE MASTECTOMY
Anesthesia: General | Site: Breast | Laterality: Right

## 2019-09-08 MED ORDER — KCL IN DEXTROSE-NACL 20-5-0.45 MEQ/L-%-% IV SOLN
INTRAVENOUS | Status: DC
  Filled 2019-09-08: qty 1000

## 2019-09-08 MED ORDER — CELECOXIB 200 MG PO CAPS
ORAL_CAPSULE | ORAL | Status: AC
  Filled 2019-09-08: qty 1

## 2019-09-08 MED ORDER — OXYCODONE HCL 5 MG PO TABS
5.0000 mg | ORAL_TABLET | Freq: Four times a day (QID) | ORAL | Status: DC
  Administered 2019-09-08: 5 mg via ORAL
  Filled 2019-09-08: qty 1

## 2019-09-08 MED ORDER — SODIUM CHLORIDE 0.9 % IV SOLN
INTRAVENOUS | Status: DC | PRN
  Administered 2019-09-08: 500 mL

## 2019-09-08 MED ORDER — PHENYLEPHRINE HCL (PRESSORS) 10 MG/ML IV SOLN
INTRAVENOUS | Status: DC | PRN
  Administered 2019-09-08 (×2): 80 ug via INTRAVENOUS
  Administered 2019-09-08: 160 ug via INTRAVENOUS
  Administered 2019-09-08: 80 ug via INTRAVENOUS

## 2019-09-08 MED ORDER — EPHEDRINE 5 MG/ML INJ
INTRAVENOUS | Status: AC
  Filled 2019-09-08: qty 10

## 2019-09-08 MED ORDER — PROPOFOL 10 MG/ML IV BOLUS
INTRAVENOUS | Status: AC
  Filled 2019-09-08: qty 20

## 2019-09-08 MED ORDER — LIDOCAINE HCL (CARDIAC) PF 100 MG/5ML IV SOSY
PREFILLED_SYRINGE | INTRAVENOUS | Status: DC | PRN
  Administered 2019-09-08: 75 mg via INTRAVENOUS

## 2019-09-08 MED ORDER — PHENYLEPHRINE 40 MCG/ML (10ML) SYRINGE FOR IV PUSH (FOR BLOOD PRESSURE SUPPORT)
PREFILLED_SYRINGE | INTRAVENOUS | Status: AC
  Filled 2019-09-08: qty 10

## 2019-09-08 MED ORDER — GLYCOPYRROLATE PF 0.2 MG/ML IJ SOSY
PREFILLED_SYRINGE | INTRAMUSCULAR | Status: AC
  Filled 2019-09-08: qty 1

## 2019-09-08 MED ORDER — MIDAZOLAM HCL 2 MG/2ML IJ SOLN: 1.0000 mg | INTRAMUSCULAR | Status: DC | PRN

## 2019-09-08 MED ORDER — ONDANSETRON HCL 4 MG/2ML IJ SOLN
INTRAMUSCULAR | Status: DC | PRN
  Administered 2019-09-08: 4 mg via INTRAVENOUS

## 2019-09-08 MED ORDER — EPHEDRINE SULFATE 50 MG/ML IJ SOLN
INTRAMUSCULAR | Status: DC | PRN
  Administered 2019-09-08: 25 mg via INTRAVENOUS

## 2019-09-08 MED ORDER — FENTANYL CITRATE (PF) 100 MCG/2ML IJ SOLN
INTRAMUSCULAR | Status: AC
  Filled 2019-09-08: qty 2

## 2019-09-08 MED ORDER — DIPHENHYDRAMINE HCL 12.5 MG/5ML PO ELIX: 12.5000 mg | ORAL_SOLUTION | Freq: Four times a day (QID) | ORAL | Status: DC | PRN

## 2019-09-08 MED ORDER — ONDANSETRON HCL 4 MG/2ML IJ SOLN: 4.0000 mg | Freq: Four times a day (QID) | INTRAMUSCULAR | Status: DC | PRN

## 2019-09-08 MED ORDER — ONDANSETRON 4 MG PO TBDP: 4.0000 mg | ORAL_TABLET | Freq: Four times a day (QID) | ORAL | Status: DC | PRN

## 2019-09-08 MED ORDER — PROPOFOL 500 MG/50ML IV EMUL
INTRAVENOUS | Status: DC | PRN
  Administered 2019-09-08: 25 ug/kg/min via INTRAVENOUS

## 2019-09-08 MED ORDER — CELECOXIB 200 MG PO CAPS
200.0000 mg | ORAL_CAPSULE | ORAL | Status: AC
  Administered 2019-09-08: 200 mg via ORAL

## 2019-09-08 MED ORDER — HYDROMORPHONE HCL 1 MG/ML IJ SOLN
0.5000 mg | Freq: Once | INTRAMUSCULAR | Status: AC | PRN
  Administered 2019-09-08: 0.5 mg via INTRAVENOUS

## 2019-09-08 MED ORDER — PROPOFOL 10 MG/ML IV BOLUS
INTRAVENOUS | Status: DC | PRN
  Administered 2019-09-08: 200 mg via INTRAVENOUS
  Administered 2019-09-08: 50 mg via INTRAVENOUS

## 2019-09-08 MED ORDER — LIDOCAINE 2% (20 MG/ML) 5 ML SYRINGE
INTRAMUSCULAR | Status: AC
  Filled 2019-09-08: qty 5

## 2019-09-08 MED ORDER — CEFAZOLIN SODIUM-DEXTROSE 2-4 GM/100ML-% IV SOLN
2.0000 g | INTRAVENOUS | Status: AC
  Administered 2019-09-08: 2 g via INTRAVENOUS

## 2019-09-08 MED ORDER — DEXAMETHASONE SODIUM PHOSPHATE 10 MG/ML IJ SOLN
INTRAMUSCULAR | Status: AC
  Filled 2019-09-08: qty 1

## 2019-09-08 MED ORDER — SENNA 8.6 MG PO TABS
1.0000 | ORAL_TABLET | Freq: Two times a day (BID) | ORAL | Status: DC
  Filled 2019-09-08: qty 1

## 2019-09-08 MED ORDER — FENTANYL CITRATE (PF) 100 MCG/2ML IJ SOLN: 50.0000 ug | INTRAMUSCULAR | Status: DC | PRN

## 2019-09-08 MED ORDER — HEPARIN SOD (PORK) LOCK FLUSH 100 UNIT/ML IV SOLN
INTRAVENOUS | Status: AC
  Filled 2019-09-08: qty 5

## 2019-09-08 MED ORDER — MIDAZOLAM HCL 5 MG/5ML IJ SOLN
INTRAMUSCULAR | Status: DC | PRN
  Administered 2019-09-08: 2 mg via INTRAVENOUS

## 2019-09-08 MED ORDER — BISACODYL 10 MG RE SUPP: 10.0000 mg | Freq: Every day | RECTAL | Status: DC | PRN

## 2019-09-08 MED ORDER — POLYETHYLENE GLYCOL 3350 17 G PO PACK: 17.0000 g | PACK | Freq: Every day | ORAL | Status: DC | PRN

## 2019-09-08 MED ORDER — DIAZEPAM 2 MG PO TABS
2.0000 mg | ORAL_TABLET | Freq: Two times a day (BID) | ORAL | Status: DC | PRN
  Administered 2019-09-08: 2 mg via ORAL
  Filled 2019-09-08: qty 1

## 2019-09-08 MED ORDER — BUPIVACAINE LIPOSOME 1.3 % IJ SUSP
INTRAMUSCULAR | Status: DC | PRN
  Administered 2019-09-08: 10 mL

## 2019-09-08 MED ORDER — HYDROMORPHONE HCL 1 MG/ML IJ SOLN
INTRAMUSCULAR | Status: AC
  Filled 2019-09-08: qty 0.5

## 2019-09-08 MED ORDER — ESMOLOL HCL 100 MG/10ML IV SOLN
INTRAVENOUS | Status: AC
  Filled 2019-09-08: qty 10

## 2019-09-08 MED ORDER — DEXAMETHASONE SODIUM PHOSPHATE 4 MG/ML IJ SOLN
INTRAMUSCULAR | Status: DC | PRN
  Administered 2019-09-08: 10 mg via INTRAVENOUS

## 2019-09-08 MED ORDER — NAPROXEN 500 MG PO TABS: 500.0000 mg | ORAL_TABLET | Freq: Two times a day (BID) | ORAL | Status: DC | PRN

## 2019-09-08 MED ORDER — GABAPENTIN 300 MG PO CAPS
300.0000 mg | ORAL_CAPSULE | ORAL | Status: AC
  Administered 2019-09-08: 300 mg via ORAL

## 2019-09-08 MED ORDER — CHLORHEXIDINE GLUCONATE CLOTH 2 % EX PADS: 6.0000 | MEDICATED_PAD | Freq: Once | CUTANEOUS | Status: DC

## 2019-09-08 MED ORDER — FENTANYL CITRATE (PF) 100 MCG/2ML IJ SOLN
INTRAMUSCULAR | Status: DC | PRN
  Administered 2019-09-08 (×3): 25 ug via INTRAVENOUS
  Administered 2019-09-08: 100 ug via INTRAVENOUS
  Administered 2019-09-08 (×6): 25 ug via INTRAVENOUS

## 2019-09-08 MED ORDER — BUPIVACAINE LIPOSOME 1.3 % IJ SUSP
INTRAMUSCULAR | Status: AC
  Filled 2019-09-08: qty 20

## 2019-09-08 MED ORDER — HEPARIN SOD (PORK) LOCK FLUSH 100 UNIT/ML IV SOLN
INTRAVENOUS | Status: DC | PRN
  Administered 2019-09-08: 500 [IU] via INTRAVENOUS

## 2019-09-08 MED ORDER — PHENYLEPHRINE HCL (PRESSORS) 10 MG/ML IV SOLN
INTRAVENOUS | Status: AC
  Filled 2019-09-08: qty 1

## 2019-09-08 MED ORDER — SODIUM CHLORIDE 0.9 % IR SOLN
Status: DC | PRN
  Administered 2019-09-08: 500 mL

## 2019-09-08 MED ORDER — CIPROFLOXACIN IN D5W 400 MG/200ML IV SOLN
400.0000 mg | Freq: Two times a day (BID) | INTRAVENOUS | Status: DC
  Administered 2019-09-08 – 2019-09-09 (×2): 400 mg via INTRAVENOUS
  Filled 2019-09-08 (×2): qty 200

## 2019-09-08 MED ORDER — IBUPROFEN 600 MG PO TABS
600.0000 mg | ORAL_TABLET | Freq: Four times a day (QID) | ORAL | Status: DC
  Administered 2019-09-08 – 2019-09-09 (×2): 600 mg via ORAL
  Filled 2019-09-08 (×2): qty 1

## 2019-09-08 MED ORDER — GABAPENTIN 300 MG PO CAPS
ORAL_CAPSULE | ORAL | Status: AC
  Filled 2019-09-08: qty 1

## 2019-09-08 MED ORDER — PHENYLEPHRINE HCL-NACL 10-0.9 MG/250ML-% IV SOLN
INTRAVENOUS | Status: DC | PRN
  Administered 2019-09-08: 25 ug/min via INTRAVENOUS

## 2019-09-08 MED ORDER — PROPOFOL 500 MG/50ML IV EMUL
INTRAVENOUS | Status: AC
  Filled 2019-09-08: qty 50

## 2019-09-08 MED ORDER — ONDANSETRON HCL 4 MG/2ML IJ SOLN
INTRAMUSCULAR | Status: AC
  Filled 2019-09-08: qty 2

## 2019-09-08 MED ORDER — FENTANYL CITRATE (PF) 100 MCG/2ML IJ SOLN
25.0000 ug | INTRAMUSCULAR | Status: DC | PRN
  Administered 2019-09-08: 25 ug via INTRAVENOUS
  Administered 2019-09-08: 50 ug via INTRAVENOUS

## 2019-09-08 MED ORDER — FENTANYL CITRATE (PF) 100 MCG/2ML IJ SOLN: 12.5000 ug | INTRAMUSCULAR | Status: DC | PRN

## 2019-09-08 MED ORDER — LACTATED RINGERS IV SOLN: INTRAVENOUS | Status: DC

## 2019-09-08 MED ORDER — MIDAZOLAM HCL 2 MG/2ML IJ SOLN
INTRAMUSCULAR | Status: AC
  Filled 2019-09-08: qty 2

## 2019-09-08 MED ORDER — DIPHENHYDRAMINE HCL 50 MG/ML IJ SOLN: 12.5000 mg | Freq: Four times a day (QID) | INTRAMUSCULAR | Status: DC | PRN

## 2019-09-08 SURGICAL SUPPLY — 69 items
APPLIER CLIP 9.375 MED OPEN (MISCELLANEOUS) ×3
BAG DECANTER FOR FLEXI CONT (MISCELLANEOUS) ×3 IMPLANT
BINDER BREAST 3XL (GAUZE/BANDAGES/DRESSINGS) IMPLANT
BINDER BREAST XLRG (GAUZE/BANDAGES/DRESSINGS) IMPLANT
BINDER BREAST XXLRG (GAUZE/BANDAGES/DRESSINGS) ×3 IMPLANT
BIOPATCH RED 1 DISK 7.0 (GAUZE/BANDAGES/DRESSINGS) ×6 IMPLANT
BLADE HEX COATED 2.75 (ELECTRODE) ×3 IMPLANT
BLADE SURG 10 STRL SS (BLADE) ×3 IMPLANT
BLADE SURG 15 STRL LF DISP TIS (BLADE) ×2 IMPLANT
BLADE SURG 15 STRL SS (BLADE) ×3
CANISTER SUCT 1200ML W/VALVE (MISCELLANEOUS) ×3 IMPLANT
CHLORAPREP W/TINT 26 (MISCELLANEOUS) ×3 IMPLANT
CLIP APPLIE 9.375 MED OPEN (MISCELLANEOUS) ×2 IMPLANT
COVER BACK TABLE 60X90IN (DRAPES) ×3 IMPLANT
COVER MAYO STAND STRL (DRAPES) ×6 IMPLANT
DERMABOND ADVANCED (GAUZE/BANDAGES/DRESSINGS) ×1
DERMABOND ADVANCED .7 DNX12 (GAUZE/BANDAGES/DRESSINGS) ×2 IMPLANT
DRAIN CHANNEL 19F RND (DRAIN) ×3 IMPLANT
DRAPE LAPAROSCOPIC ABDOMINAL (DRAPES) ×3 IMPLANT
DRAPE UTILITY XL STRL (DRAPES) ×3 IMPLANT
DRSG PAD ABDOMINAL 8X10 ST (GAUZE/BANDAGES/DRESSINGS) ×3 IMPLANT
ELECT BLADE 4.0 EZ CLEAN MEGAD (MISCELLANEOUS) ×3
ELECT COATED BLADE 2.86 ST (ELECTRODE) ×3 IMPLANT
ELECT REM PT RETURN 9FT ADLT (ELECTROSURGICAL) ×3
ELECTRODE BLDE 4.0 EZ CLN MEGD (MISCELLANEOUS) ×2 IMPLANT
ELECTRODE REM PT RTRN 9FT ADLT (ELECTROSURGICAL) ×2 IMPLANT
EVACUATOR SILICONE 100CC (DRAIN) ×3 IMPLANT
GAUZE SPONGE 4X4 12PLY STRL (GAUZE/BANDAGES/DRESSINGS) ×3 IMPLANT
GAUZE SPONGE 4X4 12PLY STRL LF (GAUZE/BANDAGES/DRESSINGS) ×6 IMPLANT
GLOVE BIO SURGEON STRL SZ 6.5 (GLOVE) ×9 IMPLANT
GLOVE BIO SURGEON STRL SZ7.5 (GLOVE) ×3 IMPLANT
GOWN STRL REUS W/ TWL LRG LVL3 (GOWN DISPOSABLE) ×6 IMPLANT
GOWN STRL REUS W/TWL LRG LVL3 (GOWN DISPOSABLE) ×9
GRAFT FLEX HD 6X16 PLIABLE (Tissue) ×3 IMPLANT
IMPL EXPANDER BREAST 650CC (Breast) ×2 IMPLANT
IMPLANT EXPANDER 650CC (Breast) ×1 IMPLANT
IMPLANT EXPANDER BREAST 650CC (Breast) ×2 IMPLANT
IV NS 1000ML (IV SOLUTION)
IV NS 1000ML BAXH (IV SOLUTION) IMPLANT
IV NS 500ML (IV SOLUTION) ×3
IV NS 500ML BAXH (IV SOLUTION) ×2 IMPLANT
KIT FILL SYSTEM UNIVERSAL (SET/KITS/TRAYS/PACK) IMPLANT
NEEDLE HYPO 25X1 1.5 SAFETY (NEEDLE) ×3 IMPLANT
NS IRRIG 1000ML POUR BTL (IV SOLUTION) ×3 IMPLANT
PACK BASIN DAY SURGERY FS (CUSTOM PROCEDURE TRAY) ×3 IMPLANT
PENCIL SMOKE EVACUATOR (MISCELLANEOUS) ×3 IMPLANT
PIN SAFETY STERILE (MISCELLANEOUS) ×3 IMPLANT
SET ASEPTIC TRANSFER (MISCELLANEOUS) ×3 IMPLANT
SLEEVE SCD COMPRESS KNEE MED (MISCELLANEOUS) ×3 IMPLANT
SPONGE LAP 18X18 RF (DISPOSABLE) ×6 IMPLANT
STRIP SUTURE WOUND CLOSURE 1/2 (MISCELLANEOUS) ×6 IMPLANT
SUT ETHILON 2 0 FS 18 (SUTURE) ×3 IMPLANT
SUT MNCRL AB 4-0 PS2 18 (SUTURE) ×6 IMPLANT
SUT MON AB 3-0 SH 27 (SUTURE) ×3
SUT MON AB 3-0 SH27 (SUTURE) ×2 IMPLANT
SUT MON AB 5-0 PS2 18 (SUTURE) ×6 IMPLANT
SUT PDS 3-0 CT2 (SUTURE)
SUT PDS AB 2-0 CT2 27 (SUTURE) ×12 IMPLANT
SUT PDS II 3-0 CT2 27 ABS (SUTURE) IMPLANT
SUT SILK 2 0 SH (SUTURE) IMPLANT
SUT SILK 3 0 PS 1 (SUTURE) ×3 IMPLANT
SUT VIC AB 3-0 SH 27 (SUTURE)
SUT VIC AB 3-0 SH 27X BRD (SUTURE) IMPLANT
SYR BULB IRRIGATION 50ML (SYRINGE) ×3 IMPLANT
SYR CONTROL 10ML LL (SYRINGE) ×3 IMPLANT
TOWEL GREEN STERILE FF (TOWEL DISPOSABLE) ×6 IMPLANT
TUBE CONNECTING 20X1/4 (TUBING) ×3 IMPLANT
UNDERPAD 30X36 HEAVY ABSORB (UNDERPADS AND DIAPERS) ×6 IMPLANT
YANKAUER SUCT BULB TIP NO VENT (SUCTIONS) ×3 IMPLANT

## 2019-09-08 NOTE — Transfer of Care (Signed)
Immediate Anesthesia Transfer of Care Note  Patient: Wendy [name]  Procedure(s) Performed: RIGHT BREAST MASTECTOMY (Right Breast) BREAST RECONSTRUCTION WITH PLACEMENT OF TISSUE EXPANDER AND FLEX HD (ACELLULAR HYDRATED DERMIS) (Right )  Patient Location: PACU  Anesthesia Type:General  Level of Consciousness: sedated and patient cooperative  Airway & Oxygen Therapy: Patient Spontanous Breathing and Patient connected to face mask oxygen  Post-op Assessment: Report given to RN and Post -op Vital signs reviewed and stable  Post vital signs: Reviewed and stable  Last Vitals:  Vitals Value Taken Time  BP 107/75 09/08/19 1604  Temp    Pulse 100 09/08/19 1606  Resp 22 09/08/19 1606  SpO2 97 % 09/08/19 1606  Vitals shown include unvalidated device data.  Last Pain: There were no vitals filed for this visit.       Complications: No apparent anesthesia complications

## 2019-09-08 NOTE — Op Note (Signed)
Op report    DATE OF OPERATION:  09/08/2019  LOCATION: [facility]  SURGICAL DIVISION: Plastic Surgery  PREOPERATIVE DIAGNOSES:  1. Breast cancer.    POSTOPERATIVE DIAGNOSES:  1. Breast cancer.   PROCEDURE:  1. Right immediate breast reconstruction with placement of Acellular Dermal Matrix and tissue expanders.  SURGEON: Kriya Westra Sanger Keeya Dyckman, DO  ASSISTANT: Phoebe Sharps, PA  ANESTHESIA:  General.   COMPLICATIONS: None.   IMPLANTS: Right - Mentor 650 cc. Ref [id number].  250 cc of injectable saline placed in the expander. Acellular Dermal Matrix 6 x 16 cm Flex HD  INDICATIONS FOR PROCEDURE:  The patient, Wendy [name], is a 64 y.o. female born on [birth year], is here for  immediate first stage breast reconstruction with placement of a right tissue expander and Acellular dermal matrix. MRN: [id number]  CONSENT:  Informed consent was obtained directly from the patient. Risks, benefits and alternatives were fully discussed. Specific risks including but not limited to bleeding, infection, hematoma, seroma, scarring, pain, implant infection, implant extrusion, capsular contracture, asymmetry, wound healing problems, and need for further surgery were all discussed. The patient did have an ample opportunity to have her questions answered to her satisfaction.   DESCRIPTION OF PROCEDURE:  The patient was taken to the operating room by the general surgery team. SCDs were placed and IV antibiotics were given. The patient's chest was prepped and draped in a sterile fashion. A time out was performed and the implants to be used were identified.  A right mastectomy was performed.  Once the general surgery team had completed their portion of the case the patient was rendered to the plastic and reconstructive surgery team.  The pectoralis major muscle was lifted from the chest wall with release of the lateral edge and lateral inframammary fold.  The pocket was  irrigated with antibiotic solution and hemostasis was achieved with electrocautery.  The ADM was then prepared according to the manufacture guidelines and slits placed to help with postoperative fluid management.  The ADM was then sutured to the inferior and lateral edge of the inframammary fold with 2-0 PDS starting with an interrupted stitch and then a running stitch.  The lateral portion was sutured to with interrupted sutures after the expander was placed.  The expander was prepared according to the manufacture guidelines, the air evacuated and then it was placed under the ADM and pectoralis major muscle.  The inferior and lateral tabs were used to secure the expander to the chest wall with 2-0 PDS.  The drain was placed at the inframammary fold over the ADM and secured to the skin with 3-0 Silk.    The deep layers were closed with 3-0 Monocryl followed by 4-0 Monocryl.  The skin was closed with 5-0 Monocryl and then dermabond was applied.  The ABDs and breast binder were placed.  The patient tolerated the procedure well and there were no complications.  The patient was allowed to wake from anesthesia and taken to the recovery room in satisfactory condition.   The advanced practice practitioner (APP) assisted throughout the case.  The APP was essential in retraction and counter traction when needed to make the case progress smoothly.  This retraction and assistance made it possible to see the tissue plans for the procedure.  The assistance was needed for blood control, tissue re-approximation and assisted with closure of the incision site.  The [place] was signed into law in 2016 which includes the topic of electronic health  records.  This provides immediate access to information in MyChart.  This includes consultation notes, operative notes, office notes, lab results and pathology reports.  If you have any questions about what you read please let us know at your next visit or call us at the  office.  We are right here with you.

## 2019-09-08 NOTE — Op Note (Signed)
09/08/2019  3:05 PM  PATIENT:  Wendy [name]  64 y.o. female  PRE-OPERATIVE DIAGNOSIS:  RIGHT BREAST CANCER  POST-OPERATIVE DIAGNOSIS:  RIGHT BREAST CANCER  PROCEDURE:  Procedure(s): RIGHT BREAST MASTECTOMY (Right) Flush of port  SURGEON:  Surgeon(s) and Role: Panel 1:    * Jovita Kussmaul, MD - Primary  PHYSICIAN ASSISTANT:   ASSISTANTS: none   ANESTHESIA:   general  EBL:  50 mL   BLOOD ADMINISTERED:none  DRAINS: none   LOCAL MEDICATIONS USED:  NONE  SPECIMEN:  Source of Specimen:  right mastectomy with some axillary tissue(nodes)  DISPOSITION OF SPECIMEN:  PATHOLOGY  COUNTS:  YES  TOURNIQUET:  * No tourniquets in log *  DICTATION: .Dragon Dictation   After informed consent was obtained the patient was brought to the operating room and placed in the supine position on the operating table.  After adequate induction of general anesthesia the patient's bilateral chest, breast, and axillary areas were prepped with ChloraPrep, allowed to dry, and draped in usual sterile manner.  An appropriate timeout was performed.  Attention was first turned to her port.  The port was accessed with a Huber needle and aspirated and it aspirated blood easily.  The port was then flushed with a concentrated heparin solution without difficulty.  The needle was removed and pressure was held for several minutes.  Attention was then turned to the right breast.  An elliptical incision was made around the nipple and areola complex which also encompassed the previous scar with a 10 blade knife.  The incision was carried through the skin and subcutaneous tissue sharply with the electrocautery.  Breast hooks were then used to elevate the skin flaps anteriorly towards the ceiling.  Thin skin flaps were then created by dissection between the breast tissue and the subcutaneous fat.  This dissection was carried out circumferentially all the way to the chest wall.  Next the breast was removed from the  pectoralis muscle with the pectoralis fascia.  Laterally scar tissue was encountered at the lateral edge of the breast tissue which was likely from the previous dissection in the axilla.  A couple of enlarged lymph nodes were closely associated with the scar.  All of this was removed with the breast tissue itself.  Once this was all freed and removed from the patient it was marked with a stitch on the lateral skin and sent to pathology for further evaluation.  Hemostasis was achieved using the Bovie electrocautery.  The wound was irrigated with copious amounts of saline.  No other palpable adenopathy was appreciated in the right axilla.  At this point the operation was turned over to Dr. Marla Roe for the reconstruction.  Her portion will be dictated separately.  The patient had tolerated the procedure well.  All needle sponge and instrument counts were correct.  PLAN OF CARE: Admit for overnight observation  PATIENT DISPOSITION:  PACU - hemodynamically stable.   Delay start of Pharmacological VTE agent (>24hrs) due to surgical blood loss or risk of bleeding: no

## 2019-09-08 NOTE — Anesthesia Procedure Notes (Signed)
Procedure Name: LMA Insertion Date/Time: 09/08/2019 1:43 PM Performed by: Marrianne Mood, CRNA Pre-anesthesia Checklist: Patient identified, Emergency Drugs available, Suction available, Patient being monitored and Timeout performed Patient Re-evaluated:Patient Re-evaluated prior to induction Oxygen Delivery Method: Circle system utilized Preoxygenation: Pre-oxygenation with 100% oxygen Induction Type: IV induction Ventilation: Mask ventilation without difficulty LMA: LMA inserted LMA Size: 4.0 Number of attempts: 1 Airway Equipment and Method: Bite block Placement Confirmation: positive ETCO2 Tube secured with: Tape Dental Injury: Teeth and Oropharynx as per pre-operative assessment

## 2019-09-08 NOTE — Anesthesia Preprocedure Evaluation (Addendum)
Anesthesia Evaluation  Patient identified by MRN, date of birth, ID band Patient awake    Reviewed: Allergy & Precautions, NPO status , Patient's Chart, lab work & pertinent test results  Airway Mallampati: II  TM Distance: >3 FB Neck ROM: Full    Dental no notable dental hx. (+) Teeth Intact, Dental Advisory Given   Pulmonary neg pulmonary ROS,    Pulmonary exam normal breath sounds clear to auscultation       Cardiovascular negative cardio ROS Normal cardiovascular exam Rhythm:Regular Rate:Normal  HLD   Neuro/Psych PSYCHIATRIC DISORDERS Anxiety negative neurological ROS     GI/Hepatic Neg liver ROS, GERD  ,  Endo/Other  negative endocrine ROSObese BMI 38  Renal/GU negative Renal ROS  negative genitourinary   Musculoskeletal  (+) Arthritis ,   Abdominal   Peds  Hematology negative hematology ROS (+)   Anesthesia Other Findings Right breast cancer  Reproductive/Obstetrics                            Anesthesia Physical Anesthesia Plan  ASA: II  Anesthesia Plan: General   Post-op Pain Management:    Induction: Intravenous  PONV Risk Score and Plan: 3 and Ondansetron, Dexamethasone and Midazolam  Airway Management Planned: LMA  Additional Equipment:   Intra-op Plan:   Post-operative Plan: Extubation in OR  Informed Consent: I have reviewed the patients History and Physical, chart, labs and discussed the procedure including the risks, benefits and alternatives for the proposed anesthesia with the patient or authorized representative who has indicated his/her understanding and acceptance.     Dental advisory given  Plan Discussed with: CRNA  Anesthesia Plan Comments:         Anesthesia Quick Evaluation

## 2019-09-08 NOTE — Interval H&P Note (Signed)
History and Physical Interval Note:  09/08/2019 12:48 PM  Wendy [name]  has presented today for surgery, with the diagnosis of RIGHT BREAST CANCER.  The various methods of treatment have been discussed with the patient and family. After consideration of risks, benefits and other options for treatment, the patient has consented to  Procedure(s): RIGHT BREAST MASTECTOMY (Right) BREAST RECONSTRUCTION WITH PLACEMENT OF TISSUE EXPANDER AND FLEX HD (ACELLULAR HYDRATED DERMIS) (Right) as a surgical intervention.  The patient's history has been reviewed, patient examined, no change in status, stable for surgery.  I have reviewed the patient's chart and labs.  Questions were answered to the patient's satisfaction.     Loel Lofty Yi Haugan

## 2019-09-08 NOTE — Interval H&P Note (Signed)
History and Physical Interval Note:  09/08/2019 12:44 PM  Wendy [name]  has presented today for surgery, with the diagnosis of RIGHT BREAST CANCER.  The various methods of treatment have been discussed with the patient and family. After consideration of risks, benefits and other options for treatment, the patient has consented to  Procedure(s): RIGHT BREAST MASTECTOMY (Right) BREAST RECONSTRUCTION WITH PLACEMENT OF TISSUE EXPANDER AND FLEX HD (ACELLULAR HYDRATED DERMIS) (Right) as a surgical intervention.  The patient's history has been reviewed, patient examined, no change in status, stable for surgery.  I have reviewed the patient's chart and labs.  Questions were answered to the patient's satisfaction.     Autumn Messing III

## 2019-09-08 NOTE — H&P (Addendum)
Wendy [name]  Location: [facility] Surgery Patient #: [id number] DOB: 16-Nov-1955 Divorced / Language: English / Race: Black or African American Female   History of Present Illness  The patient is a 64 year old female who presents for a follow-up for Breast cancer. The patient is a 65 year old white female who is about 3 weeks status post right breast lumpectomy and sentinel node biopsy for a T2 N1 A right breast cancer that was ER/PR positive and HER-2 negative with a Ki-67 of 20%. Her mammogram came back as high risk so she will also need a Port-A-Cath for chemotherapy. She tolerated the surgery well. She mainly complains of some soreness in the axilla and that soreness seems to radiate down some of the right arm.   Allergies  Vicodin ES *ANALGESICS - OPIOID*  Allergies Reconciled   Medication History  Latanoprost (0.005% Solution, Ophthalmic) Active. Medications Reconciled    Review of Systems  General Not Present- Appetite Loss, Chills, Fatigue, Fever, Night Sweats, Weight Gain and Weight Loss. Note: All other systems negative (unless as noted in HPI & included Review of Systems) Skin Not Present- Change in Wart/Mole, Dryness, Hives, Jaundice, New Lesions, Non-Healing Wounds, Rash and Ulcer. HEENT Not Present- Earache, Hearing Loss, Hoarseness, Nose Bleed, Oral Ulcers, Ringing in the Ears, Seasonal Allergies, Sinus Pain, Sore Throat, Visual Disturbances, Wears glasses/contact lenses and Yellow Eyes. Respiratory Not Present- Bloody sputum, Chronic Cough, Difficulty Breathing, Snoring and Wheezing. Breast Not Present- Breast Mass, Breast Pain, Nipple Discharge and Skin Changes. Cardiovascular Not Present- Chest Pain, Difficulty Breathing Lying Down, Leg Cramps, Palpitations, Rapid Heart Rate, Shortness of Breath and Swelling of Extremities. Gastrointestinal Not Present- Abdominal Pain, Bloating, Bloody Stool, Change in Bowel Habits, Chronic diarrhea, Constipation,  Difficulty Swallowing, Excessive gas, Gets full quickly at meals, Hemorrhoids, Indigestion, Nausea, Rectal Pain and Vomiting. Female Genitourinary Not Present- Frequency, Nocturia, Painful Urination, Pelvic Pain and Urgency. Musculoskeletal Not Present- Back Pain, Joint Pain, Joint Stiffness, Muscle Pain, Muscle Weakness and Swelling of Extremities. Neurological Not Present- Decreased Memory, Fainting, Headaches, Numbness, Seizures, Tingling, Tremor, Trouble walking and Weakness. Psychiatric Not Present- Anxiety, Bipolar, Change in Sleep Pattern, Depression, Fearful and Frequent crying. Endocrine Not Present- Cold Intolerance, Excessive Hunger, Hair Changes, Heat Intolerance, Hot flashes and New Diabetes. Hematology Not Present- Easy Bruising, Excessive bleeding, Gland problems, HIV and Persistent Infections.  Vitals  Weight: 217.8 lb Height: 63in Body Surface Area: 2.01 m Body Mass Index: 38.58 kg/m  Temp.: 97.35F(Tympanic)  Pulse: 111 (Regular)  BP: 124/72 (Sitting, Left Arm, Standard)       Physical Exam General Mental Status-Alert. General Appearance-Consistent with stated age. Hydration-Well hydrated. Voice-Normal.  Head and Neck Head-normocephalic, atraumatic with no lesions or palpable masses. Trachea-midline. Thyroid Gland Characteristics - normal size and consistency.  Eye Eyeball - Bilateral-Extraocular movements intact. Sclera/Conjunctiva - Bilateral-No scleral icterus.  Chest and Lung Exam Chest and lung exam reveals -quiet, even and easy respiratory effort with no use of accessory muscles and on auscultation, normal breath sounds, no adventitious sounds and normal vocal resonance. Inspection Chest Wall - Normal. Back - normal.  Breast Note: The right breast upper outer quadrant incision and axillary incision are healing nicely with no sign of infection or significant seroma.   Cardiovascular Cardiovascular examination  reveals -normal heart sounds, regular rate and rhythm with no murmurs and normal pedal pulses bilaterally.  Abdomen Inspection Inspection of the abdomen reveals - No Hernias. Skin - Scar - no surgical scars. Palpation/Percussion Palpation and Percussion of the abdomen reveal -  Soft, Non Tender, No Rebound tenderness, No Rigidity (guarding) and No hepatosplenomegaly. Auscultation Auscultation of the abdomen reveals - Bowel sounds normal.  Neurologic Neurologic evaluation reveals -alert and oriented x 3 with no impairment of recent or remote memory. Mental Status-Normal.  Musculoskeletal Normal Exam - Left-Upper Extremity Strength Normal and Lower Extremity Strength Normal. Normal Exam - Right-Upper Extremity Strength Normal and Lower Extremity Strength Normal.  Lymphatic Head & Neck  General Head & Neck Lymphatics: Bilateral - Description - Normal. Axillary  General Axillary Region: Bilateral - Description - Normal. Tenderness - Non Tender. Femoral & Inguinal  Generalized Femoral & Inguinal Lymphatics: Bilateral - Description - Normal. Tenderness - Non Tender.    Assessment & Plan  MALIGNANT NEOPLASM OF UPPER-OUTER QUADRANT OF RIGHT BREAST IN FEMALE, ESTROGEN RECEPTOR POSITIVE (C50.411) Impression: The patient is about 3 weeks status post right breast lumpectomy for breast cancer. She had 1 positive node as well as 3 positive margins.  The patient has had multiple reexcisions of margins which are still positive.  At this point she has elected for mastectomy with reconstruction

## 2019-09-09 ENCOUNTER — Other Ambulatory Visit: Payer: No Typology Code available for payment source

## 2019-09-09 ENCOUNTER — Ambulatory Visit: Payer: No Typology Code available for payment source | Admitting: Hematology and Oncology

## 2019-09-09 DIAGNOSIS — C50411 Malignant neoplasm of upper-outer quadrant of right female breast: Secondary | ICD-10-CM | POA: Diagnosis not present

## 2019-09-09 MED ORDER — OXYCODONE HCL 5 MG PO TABS
5.0000 mg | ORAL_TABLET | Freq: Four times a day (QID) | ORAL | Status: DC | PRN
  Administered 2019-09-09 (×2): 5 mg via ORAL
  Filled 2019-09-09 (×2): qty 1

## 2019-09-09 NOTE — Discharge Instructions (Signed)
INSTRUCTIONS FOR AFTER SURGERY   You will likely have some questions about what to expect following your operation.  The following information will help you and your family understand what to expect when you are discharged from the hospital.  Following these guidelines will help ensure a smooth recovery and reduce risks of complications.  Postoperative instructions include information on: diet, wound care, medications and physical activity.  AFTER SURGERY Expect to go home after the procedure.  In some cases, you may need to spend one night in the hospital for observation.  DIET This surgery does not require a specific diet.  However, I have to mention that the healthier you eat the better your body can start healing. It is important to increasing your protein intake.  This means limiting the foods with added sugar.  Focus on fruits and vegetables and some meat.  If you have any liposuction during your procedure be sure to drink water.  If your urine is bright yellow, then it is concentrated, and you need to drink more water.  As a general rule after surgery, you should have 8 ounces of water every hour while awake.  If you find you are persistently nauseated or unable to take in liquids let us know.  NO TOBACCO USE or EXPOSURE.  This will slow your healing process and increase the risk of a wound.  WOUND CARE If you have a drain: Clean with baby wipes until the drain is removed or at least 5 days..   If you have steri-strips / tape directly attached to your skin leave them in place. It is OK to get these wet.  No baths, pools or hot tubs for two weeks. We close your incision to leave the smallest and best-looking scar. No ointment or creams on your incisions until given the go ahead.  Especially not Neosporin (Too many skin reactions with this one).  A few weeks after surgery you can use Mederma and start massaging the scar. We ask you to wear your binder or sports bra for the first 6 weeks around the  clock, including while sleeping. This provides added comfort and helps reduce the fluid accumulation at the surgery site.  ACTIVITY No heavy lifting until cleared by the doctor.  It is OK to walk and climb stairs. In fact, moving your legs is very important to decrease your risk of a blood clot.  It will also help keep you from getting deconditioned.  Every 1 to 2 hours get up and walk for 5 minutes. This will help with a quicker recovery back to normal.  Let pain be your guide so you don't do too much.  NO, you cannot do the spring cleaning and don't plan on taking care of anyone else.  This is your time for TLC.   WORK Everyone returns to work at different times. As a rough guide, most people take at least 1 - 2 weeks off prior to returning to work. If you need documentation for your job, bring the forms to your postoperative follow up visit.  DRIVING Arrange for someone to bring you home from the hospital.  You may be able to drive a few days after surgery but not while taking any narcotics or valium.  BOWEL MOVEMENTS Constipation can occur after anesthesia and while taking pain medication.  It is important to stay ahead for your comfort.  We recommend taking Milk of Magnesia (2 tablespoons; twice a day) while taking the pain pills.  SEROMA This  is fluid your body tried to put in the surgical site.  This is normal but if it creates excessive pain and swelling let us know.  It usually decreases in a few weeks.  MEDICATIONS and PAIN CONTROL At your preoperative visit for you history and physical you were given the following medications: 1. An antibiotic: Start this medication when you get home and take according to the instructions on the bottle. Start this afternoon, Cipro given at 7:00am 2. Zofran 4 mg:  This is to treat nausea and vomiting.  You can take this every 6 hours as needed and only if needed. 3. Norco (hydrocodone/acetaminophen) 5/325 mg:  This is only to be used after you have  taken the motrin or the tylenol. Every 8 hours as needed. Over the counter Medication to take: 4. Ibuprofen (Motrin) 600 mg:  Take this every 6 hours.  If you have additional pain then take 500 mg of the tylenol.  Only take the Norco after you have tried these two. 5. Miralax or stool softener of choice: Take this according to the bottle if you take the [place] Call your surgeon's office if any of the following occur: . Fever 101 degrees F or greater . Excessive bleeding or fluid from the incision site. . Pain that increases over time without aid from the medications . Redness, warmth, or pus draining from incision sites . Persistent nausea or inability to take in liquids . Severe misshapen area that underwent the operation.    [facility] Plastic Surgery Specialist  What is the benefit of having a drain?  During surgery your tissue layers are separated.  This raw surface stimulates your body to fill the space with serous fluid.  This is normal but you don't want that fluid to collect and prevent healing.  A fluid collection can also become infected.  The Jackson-Pratt (JP) drain is used to eliminate this collection of fluid and allow the tissue to heal together.    Jackson-Pratt (JP) bulb    How to care for your drainage and suction unit at home Your drainage catheter will be connected to a collection device. The vacuum caused when the device is compressed allows drainage to collect in the device.    Wendee Copp your hands with soap and water before and after touching the system. . Empty the JP drain every 12 hours once you get home from your procedure. . Record the fluid amount on the record sheet included. . Start with stripping the drain tube to push the clots or excess fluid to the bulb.  Do this by pinching the tube with one hand near your skin.  Then with the other hand squeeze the tubing and work it toward the bulb.  This should be done several times a day.  This may collapse  the tube which will correct on its own.   . Use a safety pin to attach your collection device to your clothing so there is no tension on the insertion site.   . If you have drainage at the skin insertion site, you can apply a gauze dressing and secure it with tape. . If the drain falls out, apply a gauze dressing over the drain insertion site and secure with tape.   To empty the collection device:   . Release the stopper on the top of the collection unit (bulb).  Signa Kell contents into a measuring container such as a plastic medicine cup.  . Record the day  and amount of drainage on the attached sheet. . This should be done at least twice a day.    To compress the Jackson-Pratt Bulb:  . Release the stopper at the top of the bulb. Marland Kitchen Squeeze the bulb tightly in your fist, squeezing air out of the bulb.  . Replace the stopper while the bulb is compressed.  . Be careful not to spill the contents when squeezing the bulb. . The drainage will start bright red and turn to pink and then yellow with time. . IMPORTANT: If the bulb is not squeezed before adding the stopper it will not draw out the fluid.  Care for the JP drain site and your skin daily:  . You may shower three days after surgery. . Secure the drain to a ribbon or cloth around your waist while showering so it does not pull out while showering. . Be sure your hands are cleaned with soap and water. . Use a clean wet cotton swab to clean the skin around the drain site.  . Use another cotton swab to place Vaseline or antibiotic ointment on the skin around the drain.     Contact your physician if any of the following occur:  Marland Kitchen The fluid in the bulb becomes cloudy. . Your temperature is greater than 101.4.  Marland Kitchen The incision opens. . If you have drainage at the skin insertion site, you can apply a gauze dressing and secure it with tape. . If the drain falls out, apply a gauze dressing over the drain insertion site and secure with tape.  . You  will usually have more drainage when you are active than while you rest or are asleep. If the drainage increases significantly or is bloody call the physician                             Bring this record with you to each office visit Date  Drainage Volume  Date   Drainage volume

## 2019-09-09 NOTE — Anesthesia Postprocedure Evaluation (Signed)
Anesthesia Post Note  Patient: Wendy [name]  Procedure(s) Performed: RIGHT BREAST MASTECTOMY (Right Breast) BREAST RECONSTRUCTION WITH PLACEMENT OF TISSUE EXPANDER AND FLEX HD (ACELLULAR HYDRATED DERMIS) (Right )     Patient location during evaluation: PACU Anesthesia Type: General Level of consciousness: awake and alert Pain management: pain level controlled Vital Signs Assessment: post-procedure vital signs reviewed and stable Respiratory status: spontaneous breathing, nonlabored ventilation, respiratory function stable and patient connected to nasal cannula oxygen Cardiovascular status: blood pressure returned to baseline and stable Postop Assessment: no apparent nausea or vomiting Anesthetic complications: no    Last Vitals:  Vitals:   09/09/19 0600 09/09/19 0715  BP: 106/60   Pulse: 83   Resp: 16   Temp: 36.8 C   SpO2: 95% 95%    Last Pain:  Vitals:   09/09/19 0600  PainSc: 2    Pain Goal:                   [name] L Florentino Laabs

## 2019-09-09 NOTE — Discharge Summary (Signed)
Physician Discharge Summary  Patient ID: Wendy [name] MRN: [id number] DOB/AGE: 1956/01/12 64 y.o.  Admit date: 09/08/2019 Discharge date: 09/09/2019  Admission Diagnoses: Breast cancer  Discharge Diagnoses:  Active Problems:   Breast cancer [facility])   Discharged Condition: good  Hospital Course: Wendy [name] is sitting up in bed resting comfortably with husband at bedside this morning.  She had some pain last night but once under control was able to sleep comfortably.  Reports she is feeling well this morning.  Denies chest pain, HA, shortness of breath, N/V.  Incision is C/D/I.  Serosanguineous fluid in drain bulb.  No signs of infection, seroma/hematoma, drainage from incision, or redness.  Consults: None  Significant Diagnostic Studies: none  Treatments: surgery: right mastectomy with immediate reconstruction, placement of tissue expander and flex HD.  Discharge Exam: Blood pressure 106/60, pulse 83, temperature 98.3 F (36.8 C), resp. rate 16, height 5\' 3"  (1.6 m), weight 96.2 kg, SpO2 95 %. General appearance: alert, cooperative and no distress Head: Normocephalic, without obvious abnormality, atraumatic Eyes: EOMs intact Resp: normal effort Chest wall: symmetric rise and fall Breasts: Right breast incision c/d/i. No signs of infection, seroma/hematoma, drainage, or redness. Drain in place with serosanguinous fluid in bulb.   Disposition: Discharge disposition: 01-Home or Self Care       Discharge Instructions    Call MD for:  difficulty breathing, headache or visual disturbances   Complete by: As directed    Call MD for:  extreme fatigue   Complete by: As directed    Call MD for:  hives   Complete by: As directed    Call MD for:  persistant dizziness or light-headedness   Complete by: As directed    Call MD for:  persistant nausea and vomiting   Complete by: As directed    Call MD for:  redness, tenderness, or signs of infection (pain, swelling, redness, odor or  green/yellow discharge around incision site)   Complete by: As directed    Call MD for:  severe uncontrolled pain   Complete by: As directed    Call MD for:  temperature >100.4   Complete by: As directed    Diet - low sodium heart healthy   Complete by: As directed    Increase activity slowly   Complete by: As directed      Allergies as of 09/09/2019      Reactions   Vicodin [hydrocodone-acetaminophen] Itching   SEVERE    Brimonidine Rash      Medication List    TAKE these medications   dexamethasone 4 MG tablet Commonly known as: DECADRON Take 1 tablet (4 mg total) by mouth daily. Takes 1 tab day before chemo and 1 tab day after chemo with food.   diazepam 2 MG tablet Commonly known as: Valium Take 1 tablet (2 mg total) by mouth every 12 (twelve) hours as needed for muscle spasms.   dorzolamide-timolol 22.3-6.8 MG/ML ophthalmic solution Commonly known as: COSOPT PLACE 1 DROP INTO BOTH EYES 2 (TWO) TIMES DAILY.   ibuprofen 600 MG tablet Commonly known as: ADVIL Take 1 tablet (600 mg total) by mouth every 6 (six) hours as needed.   latanoprost 0.005 % ophthalmic solution Commonly known as: XALATAN 1 drop at bedtime.   lidocaine-prilocaine cream Commonly known as: EMLA Apply to affected area once   LORazepam 0.5 MG tablet Commonly known as: Ativan Take 1 tablet (0.5 mg total) by mouth at bedtime as needed for sleep.   ondansetron 8  MG tablet Commonly known as: Zofran Take 1 tablet (8 mg total) by mouth 2 (two) times daily as needed for refractory nausea / vomiting. Start on day 3 after chemo.   ondansetron 4 MG tablet Commonly known as: Zofran Take 1 tablet (4 mg total) by mouth every 8 (eight) hours as needed for nausea or vomiting.   oxyCODONE-acetaminophen 5-325 MG tablet Commonly known as: Percocet Take 1 tablet by mouth every 6 (six) hours as needed for severe pain.   oxyCODONE-acetaminophen 5-325 MG tablet Commonly known as: Percocet Take 1 tablet by  mouth every 6 (six) hours as needed for severe pain.   prochlorperazine 10 MG tablet Commonly known as: COMPAZINE Take 1 tablet (10 mg total) by mouth every 6 (six) hours as needed (Nausea or vomiting).   traMADol 50 MG tablet Commonly known as: ULTRAM Take 1-2 tablets (50-100 mg total) by mouth every 6 (six) hours as needed.   venlafaxine XR 37.5 MG 24 hr capsule Commonly known as: EFFEXOR-XR Take 1 capsule (37.5 mg total) by mouth daily with breakfast. May increase to 2 capsules daily if needed      Follow-up Information    Autumn Messing III, MD In 2 weeks.   Specialty: General Surgery Contact information: [address] [place] [address] [phone]        Wallace Going, DO In 1 week.   Specialty: Plastic Surgery Contact information: [address] [place] [address] [phone]        Autumn Messing III, MD In 2 weeks.   Specialty: General Surgery Contact information: [place] [address] [phone]           Signed: Threasa Heads 09/09/2019, 7:31 AM

## 2019-09-12 ENCOUNTER — Other Ambulatory Visit: Payer: No Typology Code available for payment source

## 2019-09-12 ENCOUNTER — Ambulatory Visit: Payer: No Typology Code available for payment source | Admitting: Hematology and Oncology

## 2019-09-12 ENCOUNTER — Ambulatory Visit: Payer: No Typology Code available for payment source

## 2019-09-13 ENCOUNTER — Encounter: Payer: Self-pay | Admitting: *Deleted

## 2019-09-13 LAB — SURGICAL PATHOLOGY

## 2019-09-14 NOTE — Progress Notes (Signed)
Patient Care Team: [name], MD as PCP - General (Family Medicine) Mauro Kaufmann, RN as Oncology Nurse Navigator Rockwell Germany, RN as Oncology Nurse Navigator  DIAGNOSIS:    ICD-10-CM   1. Malignant neoplasm of upper-outer quadrant of right breast in female, estrogen receptor positive ([place])  C50.411    Z17.0     SUMMARY OF ONCOLOGIC HISTORY: Oncology History  Malignant neoplasm of upper-outer quadrant of right breast in female, estrogen receptor positive ([place])  05/20/2019 Initial Diagnosis   Palpable right breast mass superficial, ultrasound 11 o'clock position 1.7 cm: Biopsy revealed grade 3 IDC with DCIS ER 100%, PR 30%, Ki-67 20%, HER-2 by IHC negative, axilla lymph node biopsy positive   06/01/2019 Cancer Staging   Staging form: Breast, AJCC 8th Edition - Clinical stage from 06/01/2019: Stage IIA (cT1c, cN1, cM0, G3, ER+, PR+, HER2-) - Signed by Nicholas Lose, MD on 06/01/2019   06/27/2019 Surgery   Right lumpectomy [name]): IDC, grade 3, at least 2.5cm, involving a complex sclerosing lesion, 1.1cm, with high grade DCIS, involved margins, 1/3 right axillary lymph nodes positive, 1.4cm.    07/11/2019 Cancer Staging   Staging form: Breast, AJCC 8th Edition - Pathologic stage from 07/11/2019: Stage IIA (pT2, pN1a, cM0, G3, ER+, PR+, HER2-) - Signed by Nicholas Lose, MD on 07/11/2019   07/19/2019 Oncotype testing   Mammaprint: high risk, 29% chance of recurrence in 10 years without systemic treatment.    08/22/2019 -  Chemotherapy   The patient had palonosetron (ALOXI) injection 0.25 mg, 0.25 mg, Intravenous,  Once, 0 of 4 cycles pegfilgrastim (NEULASTA ONPRO KIT) injection 6 mg, 6 mg, Subcutaneous, Once, 0 of 4 cycles cyclophosphamide (CYTOXAN) 1,260 mg in sodium chloride 0.9 % 250 mL chemo infusion, 600 mg/m2 = 1,260 mg, Intravenous,  Once, 0 of 4 cycles DOCEtaxel (TAXOTERE) 160 mg in sodium chloride 0.9 % 250 mL chemo infusion, 75 mg/m2 = 160 mg, Intravenous,  Once, 0  of 4 cycles  for chemotherapy treatment.    09/08/2019 Surgery   Re-excision x2 (08/01/19 and 08/12/19) followed by right mastectomy [name]): residual carcinoma and DCIS, 1/5 lymph nodes positive, clear margins.      CHIEF COMPLIANT:   INTERVAL HISTORY: Wendy [name] is a 64 y.o. with above-mentioned history of right breast cancer who underwent a right lumpectomy followed by re-excision. Due to positive margins, she underwent a right mastectomy with Dr. [name] on 09/08/19 for which pathology showed residual carcinoma and DCIS, with 1/5 lymph nodes positive and clear margins. She is currently on adjuvant chemotherapy with Taxotere and Cytoxan. She presents to the clinic today for cycle 1.   ALLERGIES:  is allergic to vicodin [hydrocodone-acetaminophen] and brimonidine.  MEDICATIONS:  Current Outpatient Medications  Medication Sig Dispense Refill  . dexamethasone (DECADRON) 4 MG tablet Take 1 tablet (4 mg total) by mouth daily. Takes 1 tab day before chemo and 1 tab day after chemo with food. 8 tablet 0  . diazepam (VALIUM) 2 MG tablet Take 1 tablet (2 mg total) by mouth every 12 (twelve) hours as needed for muscle spasms. 20 tablet 0  . dorzolamide-timolol (COSOPT) 22.3-6.8 MG/ML ophthalmic solution PLACE 1 DROP INTO BOTH EYES 2 (TWO) TIMES DAILY.    Marland Kitchen ibuprofen (ADVIL) 600 MG tablet Take 1 tablet (600 mg total) by mouth every 6 (six) hours as needed. 30 tablet 0  . latanoprost (XALATAN) 0.005 % ophthalmic solution 1 drop at bedtime.    . lidocaine-prilocaine (EMLA) cream Apply to  affected area once 30 g 3  . LORazepam (ATIVAN) 0.5 MG tablet Take 1 tablet (0.5 mg total) by mouth at bedtime as needed for sleep. 30 tablet 0  . ondansetron (ZOFRAN) 4 MG tablet Take 1 tablet (4 mg total) by mouth every 8 (eight) hours as needed for nausea or vomiting. 20 tablet 0  . ondansetron (ZOFRAN) 8 MG tablet Take 1 tablet (8 mg total) by mouth 2 (two) times daily as needed for refractory nausea / vomiting. Start  on day 3 after chemo. 30 tablet 1  . oxyCODONE-acetaminophen (PERCOCET) 5-325 MG tablet Take 1 tablet by mouth every 6 (six) hours as needed for severe pain. 15 tablet 0  . oxyCODONE-acetaminophen (PERCOCET) 5-325 MG tablet Take 1 tablet by mouth every 6 (six) hours as needed for severe pain. 15 tablet 0  . prochlorperazine (COMPAZINE) 10 MG tablet Take 1 tablet (10 mg total) by mouth every 6 (six) hours as needed (Nausea or vomiting). 30 tablet 1  . traMADol (ULTRAM) 50 MG tablet Take 1-2 tablets (50-100 mg total) by mouth every 6 (six) hours as needed. 20 tablet 1  . venlafaxine XR (EFFEXOR-XR) 37.5 MG 24 hr capsule Take 1 capsule (37.5 mg total) by mouth daily with breakfast. May increase to 2 capsules daily if needed 60 capsule 1   No current facility-administered medications for this visit.    PHYSICAL EXAMINATION: ECOG PERFORMANCE STATUS: 1 - Symptomatic but completely ambulatory  There were no vitals filed for this visit. There were no vitals filed for this visit.  LABORATORY DATA:  I have reviewed the data as listed CMP Latest Ref Rng & Units 04/30/2018 08/31/2017 03/04/2013  Glucose 65 - 99 mg/dL 91 98 89  BUN 8 - 27 mg/dL '9 10 13  ' Creatinine 0.57 - 1.00 mg/dL 0.75 0.72 0.70  Sodium 134 - 144 mmol/L 142 140 138  Potassium 3.5 - 5.2 mmol/L 3.7 4.1 4.2  Chloride 96 - 106 mmol/L 103 106 105  CO2 20 - 29 mmol/L '24 21 28  ' Calcium 8.7 - 10.3 mg/dL 9.2 9.4 9.4  Total Protein 6.0 - 8.5 g/dL 7.0 7.3 7.6  Total Bilirubin 0.0 - 1.2 mg/dL 0.6 0.3 0.6  Alkaline Phos 39 - 117 IU/L 74 77 86  AST 0 - 40 IU/L '22 17 30  ' ALT 0 - 32 IU/L '12 11 22    ' Lab Results  Component Value Date   WBC 6.3 09/17/2018   HGB 14.4 09/17/2018   HCT 47.7 (H) 09/17/2018   MCV 89.8 09/17/2018   PLT 179 09/17/2018   NEUTROABS 3.4 04/11/2014    ASSESSMENT & PLAN:  Malignant neoplasm of upper-outer quadrant of right breast in female, estrogen receptor positive ([place]) 06/27/2019:Right lumpectomy [name]): IDC,  grade 3, at least 2.5cm, involving a complex sclerosing lesion, 1.1cm, with high grade DCIS, involved margins, 1/3 right axillary lymph nodes positive, 1.4cm.ER 100%, PR 30%, Ki-67 20%, HER-2 IHC negative T2 N1 stage IIA MammaPrint: High risk, 10-year risk of recurrence untreated: 29% 09/08/2019:Re-excision x2 (08/01/19 and 08/12/19) followed by right mastectomy [name]): residual carcinoma and DCIS, 1/5 lymph nodes positive, clear margins.   Treatment plan: 1. Adjuvant chemotherapy with Taxotere and Cytoxan every 3 weeks x4 2.  Adjuvant radiation therapy 3.Follow-up adjuvant antiestrogen therapy with anastrozole 1 mg daily x7 years Genetic testing ------------------------------------------------------------------------------------------------------ Pathology review: I discussed the final pathology report We will plan to start chemotherapy on 10/14/2019  No orders of the defined types were placed in this encounter.  The  patient has a good understanding of the overall plan. she agrees with it. she will call with any problems that may develop before the next visit here.  Total time spent: 30 mins including face to face time and time spent for planning, charting and coordination of care  Nicholas Lose, MD 09/15/2019  I, Cloyde Reams Dorshimer, am acting as scribe for Dr. Nicholas Lose.  I have reviewed the above documentation for accuracy and completeness, and I agree with the above.

## 2019-09-15 ENCOUNTER — Other Ambulatory Visit: Payer: Self-pay

## 2019-09-15 ENCOUNTER — Inpatient Hospital Stay: Payer: 59 | Attending: Oncology | Admitting: Hematology and Oncology

## 2019-09-15 DIAGNOSIS — Z9011 Acquired absence of right breast and nipple: Secondary | ICD-10-CM | POA: Diagnosis not present

## 2019-09-15 DIAGNOSIS — Z170 Estrogen receptor positive status [ER+]: Secondary | ICD-10-CM | POA: Diagnosis not present

## 2019-09-15 DIAGNOSIS — C50411 Malignant neoplasm of upper-outer quadrant of right female breast: Secondary | ICD-10-CM | POA: Diagnosis not present

## 2019-09-15 DIAGNOSIS — C773 Secondary and unspecified malignant neoplasm of axilla and upper limb lymph nodes: Secondary | ICD-10-CM | POA: Insufficient documentation

## 2019-09-15 NOTE — Assessment & Plan Note (Signed)
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   Treatment plan: 1. Adjuvant chemotherapy with Taxotere and Cytoxan every 3 weeks x4 2.  Adjuvant radiation therapy 3.Follow-up adjuvant antiestrogen therapy Genetic testing ------------------------------------------------------------------------------------------------------ Pathology review: I discussed the final pathology report We will plan to start chemotherapy in 3 weeks.

## 2019-09-16 ENCOUNTER — Ambulatory Visit (INDEPENDENT_AMBULATORY_CARE_PROVIDER_SITE_OTHER): Payer: 59 | Admitting: Plastic Surgery

## 2019-09-16 ENCOUNTER — Encounter: Payer: Self-pay | Admitting: *Deleted

## 2019-09-16 ENCOUNTER — Encounter: Payer: Self-pay | Admitting: Plastic Surgery

## 2019-09-16 ENCOUNTER — Telehealth: Payer: Self-pay | Admitting: Plastic Surgery

## 2019-09-16 ENCOUNTER — Other Ambulatory Visit: Payer: Self-pay | Admitting: Plastic Surgery

## 2019-09-16 DIAGNOSIS — Z9011 Acquired absence of right breast and nipple: Secondary | ICD-10-CM

## 2019-09-16 DIAGNOSIS — Z170 Estrogen receptor positive status [ER+]: Secondary | ICD-10-CM

## 2019-09-16 DIAGNOSIS — Z9010 Acquired absence of unspecified breast and nipple: Secondary | ICD-10-CM | POA: Insufficient documentation

## 2019-09-16 DIAGNOSIS — C50411 Malignant neoplasm of upper-outer quadrant of right female breast: Secondary | ICD-10-CM

## 2019-09-16 MED ORDER — OXYCODONE-ACETAMINOPHEN 2.5-325 MG PO TABS: 1.0000 | ORAL_TABLET | Freq: Three times a day (TID) | ORAL | 0 refills | Status: DC | PRN

## 2019-09-16 MED ORDER — OXYCODONE-ACETAMINOPHEN 2.5-325 MG PO TABS: 1.0000 | ORAL_TABLET | Freq: Three times a day (TID) | ORAL | 0 refills | Status: AC | PRN

## 2019-09-16 NOTE — Telephone Encounter (Signed)
Wendy [name] sent in medication to [facility]

## 2019-09-16 NOTE — Telephone Encounter (Signed)
Patient called to say that the prescription for hydrocodone was not in stock at her pharmacy but it is in stock at the [place] location. She said the pharmacy staff told her the prescription would need to be canceled at the [place] location and called into the [place] location. Please call her to confirm when prescription has been called in at the new location.

## 2019-09-16 NOTE — Progress Notes (Signed)
   Subjective:    Patient ID: Wendy [name], female    DOB: Dec 12, 1955, 64 y.o.   MRN: [id number]  The patient is a 64 year old female here for follow-up after undergoing a right mastectomy.  She has a expander in place.  The expander was filled with 250 cc of saline in the OR.  Her drain output has been minimal.  Not ready for removal yet.  There is no sign of seroma or hematoma.  The incision is healing nicely.  She complains of some burning pain. She is otherwise doing well.     Review of Systems  Constitutional: Negative.   HENT: Negative.   Eyes: Negative.   Respiratory: Negative.   Genitourinary: Negative.   Hematological: Negative.        Objective:   Physical Exam Vitals and nursing note reviewed.  Constitutional:      Appearance: Normal appearance.  HENT:     Head: Normocephalic and atraumatic.  Cardiovascular:     Rate and Rhythm: Normal rate.     Pulses: Normal pulses.  Neurological:     General: No focal deficit present.     Mental Status: She is alert.  Psychiatric:        Mood and Affect: Mood normal.        Behavior: Behavior normal.       Assessment & Plan:     ICD-10-CM   1. Acquired absence of right breast  Z90.11     We placed injectable saline in the Expander using a sterile technique: Right: 320 cc for a total of 70 / 650 cc  Plan to remove drain next visit.

## 2019-09-22 NOTE — Progress Notes (Signed)
Subjective:     Patient ID: Wendy [name], female    DOB: 1955/12/07, 63 y.o.   MRN: [id number]  Chief Complaint  Patient presents with  . Post-op Follow-up    for (R) breast reconstruction w/expander and flex HD     HPI: The patient is a 64 y.o. female here for follow-up after undergoing a right mastectomy with Dr. [name] and immediate reconstruction with placement of tissue expander with Dr. Marla Roe on 09/08/2019.  Patient reports she is doing well.  Denies fever, chest pain, shortness of breath, N/V. BM+.  She reports she did well with her last fill, minimal discomfort.  Incision is healing very well, C/D/I.  No signs of infection, redness, drainage, seroma/hematoma.  Drain output has continued to decrease steadily.  Drain removed today.  Patient will start her chemotherapy on April 9.  She will do 4 cycles based every 3 weeks.  She will then begin radiation.  We would like to target her surgery for exchange to implants before the end of May.   Review of Systems  Constitutional: Negative for chills and fever.  HENT: Negative for congestion, sneezing and sore throat.   Respiratory: Negative for cough and shortness of breath.   Cardiovascular: Negative for chest pain.  Gastrointestinal: Negative for constipation, diarrhea, nausea and vomiting.  Skin: Negative for color change, pallor and rash.     Objective:   Vital Signs BP 127/83 (BP Location: Left Arm, Patient Position: Sitting, Cuff Size: Large)   Pulse 74   Temp (!) 97.3 F (36.3 C) (Temporal)   Ht 5\' 3"  (1.6 m)   Wt 214 lb 6.4 oz (97.3 kg)   SpO2 98%   BMI 37.98 kg/m  Vital Signs and Nursing Note Reviewed  Physical Exam  Constitutional: She is oriented to person, place, and time and well-developed, well-nourished, and in no distress.  HENT:  Head: Normocephalic and atraumatic.  Eyes: EOM are normal.  Cardiovascular: Normal rate.  Pulmonary/Chest: Effort normal.  Right breast incision healing well, C/D/I.   Some Dermabond remains.  No signs of infection, redness, drainage, seroma/hematoma.  Drain in place with small amount of serosanguineous fluid in the drain bulb.  This has been decreasing steadily.  Drain site shows no signs of infection or redness.  Musculoskeletal:        General: Normal range of motion.     Cervical back: Normal range of motion.  Neurological: She is alert and oriented to person, place, and time. Gait normal.  Skin: Skin is warm and dry. No rash noted. No erythema. No pallor.  Psychiatric: Mood, memory, affect and judgment normal.      Assessment/Plan:     ICD-10-CM   1. Acquired absence of right breast  Z90.11   2. Malignant neoplasm of upper-outer quadrant of right breast in female, estrogen receptor positive ([place])  C50.411    Z17.0     Wendy [name] is doing very well.  She tolerated her last fill well and would like to move forward with additional fill today.  She starts chemo April 9.  Will have 4 cycles every 3 weeks and then begin radiation.  Plan is to target surgery to exchange expanders to implants before the end of May.  We will need to adjust schedule for fills as appropriate to achieve this goal.  We placed injectable saline in the Expander using a sterile technique: Right: 50 cc for a total of 370 / 650 cc  Continue to wear sports bra  or binder 24/7 until 6 weeks postop.  Apply Vaseline and gauze to drain site daily until closed.  Follow-up in 2 weeks for additional fill.  Call office with any questions/concerns.  The [place] was signed into law in 2016 which includes the topic of electronic health records.  This provides immediate access to information in MyChart.  This includes consultation notes, operative notes, office notes, lab results and pathology reports.  If you have any questions about what you read please let us know at your next visit or call us at the office.  We are right here with you.   Threasa Heads, PA-C 09/23/2019,  11:53 AM

## 2019-09-23 ENCOUNTER — Other Ambulatory Visit: Payer: No Typology Code available for payment source

## 2019-09-23 ENCOUNTER — Encounter: Payer: Self-pay | Admitting: Plastic Surgery

## 2019-09-23 ENCOUNTER — Ambulatory Visit (INDEPENDENT_AMBULATORY_CARE_PROVIDER_SITE_OTHER): Payer: 59 | Admitting: Plastic Surgery

## 2019-09-23 ENCOUNTER — Ambulatory Visit: Payer: No Typology Code available for payment source | Admitting: Hematology and Oncology

## 2019-09-23 ENCOUNTER — Ambulatory Visit: Payer: No Typology Code available for payment source

## 2019-09-23 ENCOUNTER — Other Ambulatory Visit: Payer: Self-pay

## 2019-09-23 VITALS — BP 127/83 | HR 74 | Temp 97.3°F | Ht 63.0 in | Wt 214.4 lb

## 2019-09-23 DIAGNOSIS — Z9011 Acquired absence of right breast and nipple: Secondary | ICD-10-CM

## 2019-09-23 DIAGNOSIS — C50411 Malignant neoplasm of upper-outer quadrant of right female breast: Secondary | ICD-10-CM

## 2019-09-23 DIAGNOSIS — Z170 Estrogen receptor positive status [ER+]: Secondary | ICD-10-CM

## 2019-10-02 NOTE — Progress Notes (Signed)
Patient is a 64 year old female here for follow-up after undergoing a right mastectomy with Dr. [name] and immediate first stage reconstruction with placement of tissue expander with Dr. Marla Roe on 09/08/2019.  Her drain was removed on 09/23/2019.  Today she reports she is doing well overall.  Reports some pain, but is able to keep it well controlled.  Had no issues with her last fill other than some discomfort at first.  Right breast incision is healing well, C/D/I.  No signs of infection, redness, drainage.  Some minor swelling present.  Patient will start chemotherapy on April 9, 4 cycles every 3 weeks.  Then she will begin radiation.  We would like to target surgery for exchange to implants mid-late May.  We placed injectable saline in the Expander using a sterile technique: Right: 70 cc for a total of 440 / 650 cc  Follow-up in 2 weeks for additional fill.  Call office with any questions/concerns.  The [place] was signed into law in 2016 which includes the topic of electronic health records.  This provides immediate access to information in MyChart.  This includes consultation notes, operative notes, office notes, lab results and pathology reports.  If you have any questions about what you read please let us know at your next visit or call us at the office.  We are right here with you.

## 2019-10-03 ENCOUNTER — Encounter: Payer: Self-pay | Admitting: Plastic Surgery

## 2019-10-03 ENCOUNTER — Ambulatory Visit: Payer: No Typology Code available for payment source | Admitting: Adult Health

## 2019-10-03 ENCOUNTER — Other Ambulatory Visit: Payer: No Typology Code available for payment source

## 2019-10-03 ENCOUNTER — Ambulatory Visit (INDEPENDENT_AMBULATORY_CARE_PROVIDER_SITE_OTHER): Payer: 59 | Admitting: Plastic Surgery

## 2019-10-03 ENCOUNTER — Other Ambulatory Visit: Payer: Self-pay

## 2019-10-03 ENCOUNTER — Ambulatory Visit: Payer: No Typology Code available for payment source

## 2019-10-03 VITALS — BP 138/87 | HR 71 | Temp 97.3°F | Ht 63.0 in | Wt 214.0 lb

## 2019-10-03 DIAGNOSIS — Z9011 Acquired absence of right breast and nipple: Secondary | ICD-10-CM

## 2019-10-03 DIAGNOSIS — C50411 Malignant neoplasm of upper-outer quadrant of right female breast: Secondary | ICD-10-CM

## 2019-10-03 DIAGNOSIS — Z170 Estrogen receptor positive status [ER+]: Secondary | ICD-10-CM

## 2019-10-04 ENCOUNTER — Ambulatory Visit: Payer: 59 | Admitting: Plastic Surgery

## 2019-10-06 ENCOUNTER — Other Ambulatory Visit: Payer: Self-pay | Admitting: Hematology and Oncology

## 2019-10-10 NOTE — Progress Notes (Signed)
Pharmacist Chemotherapy Monitoring - Initial Assessment    Anticipated start date: 10/14/19  Regimen:  . Are orders appropriate based on the patient's diagnosis, regimen, and cycle? Yes . Does the plan date match the patient's scheduled date? Yes . Is the sequencing of drugs appropriate? Yes . Are the premedications appropriate for the patient's regimen? Yes . Prior Authorization for treatment is: Pending o If applicable, is the correct biosimilar selected based on the patient's insurance? not applicable  Organ Function and Labs: Marland Kitchen Are dose adjustments needed based on the patient's renal function, hepatic function, or hematologic function? No . Are appropriate labs ordered prior to the start of patient's treatment? Yes . Other organ system assessment, if indicated: N/A . The following baseline labs, if indicated, have been ordered: N/A  Dose Assessment: . Are the drug doses appropriate? Yes . Are the following correct: o Drug concentrations Yes o IV fluid compatible with drug Yes o Administration routes Yes o Timing of therapy Yes . If applicable, does the patient have documented access for treatment and/or plans for port-a-cath placement? yes . If applicable, have lifetime cumulative doses been properly documented and assessed? not applicable Lifetime Dose Tracking  No doses have been documented on this patient for the following tracked chemicals: Doxorubicin, Epirubicin, Idarubicin, Daunorubicin, Mitoxantrone, Bleomycin, Oxaliplatin, Carboplatin, Liposomal Doxorubicin  o   Toxicity Monitoring/Prevention: . The patient has the following take home antiemetics prescribed: Ondansetron and Prochlorperazine . The patient has the following take home medications prescribed: N/A . Medication allergies and previous infusion related reactions, if applicable, have been reviewed and addressed. Yes . The patient's current medication list has been assessed for drug-drug interactions with their  chemotherapy regimen.  .  significant drug-drug interactions were identified on review.  Order Review: . Are the treatment plan orders signed? Yes . Is the patient scheduled to see a provider prior to their treatment? Yes  I verify that I have reviewed each item in the above checklist and answered each question accordingly.  Romualdo Bolk [facility] 10/10/2019 2:17 PM

## 2019-10-12 ENCOUNTER — Ambulatory Visit: Payer: 59 | Admitting: Plastic Surgery

## 2019-10-13 NOTE — Progress Notes (Signed)
Patient Care Team: [name], MD as PCP - General (Family Medicine) Mauro Kaufmann, RN as Oncology Nurse Navigator Rockwell Germany, RN as Oncology Nurse Navigator  DIAGNOSIS:    ICD-10-CM   1. Malignant neoplasm of upper-outer quadrant of right breast in female, estrogen receptor positive ([place])  C50.411    Z17.0     SUMMARY OF ONCOLOGIC HISTORY: Oncology History  Malignant neoplasm of upper-outer quadrant of right breast in female, estrogen receptor positive ([place])  05/20/2019 Initial Diagnosis   Palpable right breast mass superficial, ultrasound 11 o'clock position 1.7 cm: Biopsy revealed grade 3 IDC with DCIS ER 100%, PR 30%, Ki-67 20%, HER-2 by IHC negative, axilla lymph node biopsy positive   06/01/2019 Cancer Staging   Staging form: Breast, AJCC 8th Edition - Clinical stage from 06/01/2019: Stage IIA (cT1c, cN1, cM0, G3, ER+, PR+, HER2-) - Signed by Nicholas Lose, MD on 06/01/2019   06/27/2019 Surgery   Right lumpectomy [name]): IDC, grade 3, at least 2.5cm, involving a complex sclerosing lesion, 1.1cm, with high grade DCIS, involved margins, 1/3 right axillary lymph nodes positive, 1.4cm.    07/11/2019 Cancer Staging   Staging form: Breast, AJCC 8th Edition - Pathologic stage from 07/11/2019: Stage IIA (pT2, pN1a, cM0, G3, ER+, PR+, HER2-) - Signed by Nicholas Lose, MD on 07/11/2019   07/19/2019 Oncotype testing   Mammaprint: high risk, 29% chance of recurrence in 10 years without systemic treatment.    09/08/2019 Surgery   Re-excision x2 (08/01/19 and 08/12/19) followed by right mastectomy [name]): residual carcinoma and DCIS, 1/5 lymph nodes positive, clear margins.    10/14/2019 -  Chemotherapy   The patient had palonosetron (ALOXI) injection 0.25 mg, 0.25 mg, Intravenous,  Once, 0 of 4 cycles pegfilgrastim (NEULASTA ONPRO KIT) injection 6 mg, 6 mg, Subcutaneous, Once, 0 of 4 cycles cyclophosphamide (CYTOXAN) 1,260 mg in sodium chloride 0.9 % 250 mL chemo infusion, 600  mg/m2 = 1,260 mg, Intravenous,  Once, 0 of 4 cycles DOCEtaxel (TAXOTERE) 160 mg in sodium chloride 0.9 % 250 mL chemo infusion, 75 mg/m2 = 160 mg, Intravenous,  Once, 0 of 4 cycles  for chemotherapy treatment.      CHIEF COMPLIANT: Cycle 1 Taxotere and Cytoxan  INTERVAL HISTORY: Wendy [name] is a 64 y.o. with above-mentioned history of right breast cancer who underwent a lumpectomy followed by re-excision and then a mastectomy and is currently on adjuvant chemotherapy with Taxotere and Cytoxan. She presents to the clinic today for cycle 1.  She is healing very well from the recent surgeries.  ALLERGIES:  is allergic to vicodin [hydrocodone-acetaminophen] and brimonidine.  MEDICATIONS:  Current Outpatient Medications  Medication Sig Dispense Refill  . dexamethasone (DECADRON) 4 MG tablet Take 1 tablet (4 mg total) by mouth daily. Takes 1 tab day before chemo and 1 tab day after chemo with food. 8 tablet 0  . diazepam (VALIUM) 2 MG tablet Take 1 tablet (2 mg total) by mouth every 12 (twelve) hours as needed for muscle spasms. 20 tablet 0  . dorzolamide-timolol (COSOPT) 22.3-6.8 MG/ML ophthalmic solution PLACE 1 DROP INTO BOTH EYES 2 (TWO) TIMES DAILY.    Marland Kitchen latanoprost (XALATAN) 0.005 % ophthalmic solution 1 drop at bedtime.    . lidocaine-prilocaine (EMLA) cream Apply to affected area once 30 g 3  . LORazepam (ATIVAN) 0.5 MG tablet Take 1 tablet (0.5 mg total) by mouth at bedtime as needed for sleep. 30 tablet 0  . ondansetron (ZOFRAN) 8 MG tablet Take  1 tablet (8 mg total) by mouth 2 (two) times daily as needed for refractory nausea / vomiting. Start on day 3 after chemo. 30 tablet 1  . prochlorperazine (COMPAZINE) 10 MG tablet Take 1 tablet (10 mg total) by mouth every 6 (six) hours as needed (Nausea or vomiting). 30 tablet 1  . venlafaxine XR (EFFEXOR-XR) 37.5 MG 24 hr capsule TAKE 1 CAPSULE BY MOUTH EVERY DAY WITH BREAKFAST. MAY INCREASE TO 2 CAPS DAILY IF NEEDED 90 capsule 2   No  current facility-administered medications for this visit.   Facility-Administered Medications Ordered in Other Visits  Medication Dose Route Frequency Provider Last Rate Last Admin  . heparin lock flush 100 unit/mL  500 Units Intravenous Once Nicholas Lose, MD      . sodium chloride flush (NS) 0.9 % injection 10 mL  10 mL Intravenous PRN Nicholas Lose, MD   10 mL at 10/14/19 1032    PHYSICAL EXAMINATION: ECOG PERFORMANCE STATUS: 1 - Symptomatic but completely ambulatory  Vitals:   10/14/19 1053  BP: (!) 141/85  Pulse: 78  Resp: 18  Temp: 98.9 F (37.2 C)  SpO2: 99%   Filed Weights   10/14/19 1053  Weight: 214 lb 3.2 oz (97.2 kg)     LABORATORY DATA:  I have reviewed the data as listed CMP Latest Ref Rng & Units 04/30/2018 08/31/2017 03/04/2013  Glucose 65 - 99 mg/dL 91 98 89  BUN 8 - 27 mg/dL _0 Creatinine 0.57 - 1.00 mg/dL 0.75 0.72 0.70  Sodium 134 - 144 mmol/L 142 140 138  Potassium 3.5 - 5.2 mmol/L 3.7 4.1 4.2  Chloride 96 - 106 mmol/L 103 106 105  CO2 20 - 29 mmol/L _1 Calcium 8.7 - 10.3 mg/dL 9.2 9.4 9.4  Total Protein 6.0 - 8.5 g/dL 7.0 7.3 7.6  Total Bilirubin 0.0 - 1.2 mg/dL 0.6 0.3 0.6  Alkaline Phos 39 - 117 IU/L 74 77 86  AST 0 - 40 IU/L _2 ALT 0 - 32 IU/L _3 Lab Results  Component Value Date   WBC 10.7 (H) 10/14/2019   HGB 14.2 10/14/2019   HCT 44.8 10/14/2019   MCV 87.3 10/14/2019   PLT 196 10/14/2019   NEUTROABS 7.0 10/14/2019    ASSESSMENT & PLAN:  Malignant neoplasm of upper-outer quadrant of right breast in female, estrogen receptor positive ([place]) 06/27/2019:Right lumpectomy [name]): IDC, grade 3, at least 2.5cm, involving a complex sclerosing lesion, 1.1cm, with high grade DCIS, involved margins, 1/3 right axillary lymph nodes positive, 1.4cm.ER 100%, PR 30%, Ki-67 20%, HER-2 IHC negative T2 N1 stage IIA [place]amma[place]: High risk, 10-year risk of recurrence untreated: 29% 09/08/2019:Re-excision x2 (08/01/19 and  08/12/19) followed by right mastectomy [name]): residual carcinoma and DCIS, 1/5 lymph nodes positive, clear margins.   Treatment plan: 1. Adjuvant chemotherapy withTaxotere and Cytoxan every 3 weeks x4 2.Adjuvant radiation therapy 3.Follow-up adjuvant antiestrogen therapy with anastrozole 1 mg daily x7 years Genetic testing ------------------------------------------------------------------------------------------------------ Current treatment: Cycle 1 Taxotere and Cytoxan Labs reviewed, antiemetics reviewed, chemo education completed, chemo consent obtained. She is getting Neulasta on pro. Return to clinic in 1 week for toxicity check   No orders of the defined types were placed in this encounter.  The patient has a good understanding of the overall plan. she agrees with it. she will call with any problems that may develop before the next visit here.  Total time spent: 30 mins including face to  face time and time spent for planning, charting and coordination of care  Rulon Eisenmenger, MD 10/14/2019  I, Molly Dorshimer, am acting as scribe for Dr. Nicholas Lose.  I have reviewed the above documentation for accuracy and completeness, and I agree with the above.

## 2019-10-14 ENCOUNTER — Inpatient Hospital Stay: Payer: No Typology Code available for payment source

## 2019-10-14 ENCOUNTER — Encounter: Payer: Self-pay | Admitting: *Deleted

## 2019-10-14 ENCOUNTER — Encounter: Payer: Self-pay | Admitting: Hematology and Oncology

## 2019-10-14 ENCOUNTER — Inpatient Hospital Stay (HOSPITAL_BASED_OUTPATIENT_CLINIC_OR_DEPARTMENT_OTHER): Payer: No Typology Code available for payment source | Admitting: Hematology and Oncology

## 2019-10-14 ENCOUNTER — Other Ambulatory Visit: Payer: Self-pay

## 2019-10-14 ENCOUNTER — Inpatient Hospital Stay: Payer: No Typology Code available for payment source | Attending: Hematology and Oncology

## 2019-10-14 VITALS — BP 128/73 | HR 67 | Resp 17

## 2019-10-14 DIAGNOSIS — C50411 Malignant neoplasm of upper-outer quadrant of right female breast: Secondary | ICD-10-CM

## 2019-10-14 DIAGNOSIS — Z5111 Encounter for antineoplastic chemotherapy: Secondary | ICD-10-CM | POA: Insufficient documentation

## 2019-10-14 DIAGNOSIS — Z170 Estrogen receptor positive status [ER+]: Secondary | ICD-10-CM | POA: Insufficient documentation

## 2019-10-14 DIAGNOSIS — Z95828 Presence of other vascular implants and grafts: Secondary | ICD-10-CM

## 2019-10-14 DIAGNOSIS — Z5189 Encounter for other specified aftercare: Secondary | ICD-10-CM | POA: Insufficient documentation

## 2019-10-14 LAB — CBC WITH DIFFERENTIAL (CANCER CENTER ONLY)
Abs Immature Granulocytes: 0.04 10*3/uL (ref 0.00–0.07)
Basophils Absolute: 0 10*3/uL (ref 0.0–0.1)
Basophils Relative: 0 %
Eosinophils Absolute: 0 10*3/uL (ref 0.0–0.5)
Eosinophils Relative: 0 %
HCT: 44.8 % (ref 36.0–46.0)
Hemoglobin: 14.2 g/dL (ref 12.0–15.0)
Immature Granulocytes: 0 %
Lymphocytes Relative: 24 %
Lymphs Abs: 2.6 10*3/uL (ref 0.7–4.0)
MCH: 27.7 pg (ref 26.0–34.0)
MCHC: 31.7 g/dL (ref 30.0–36.0)
MCV: 87.3 fL (ref 80.0–100.0)
Monocytes Absolute: 1.1 10*3/uL — ABNORMAL HIGH (ref 0.1–1.0)
Monocytes Relative: 10 %
Neutro Abs: 7 10*3/uL (ref 1.7–7.7)
Neutrophils Relative %: 66 %
Platelet Count: 196 10*3/uL (ref 150–400)
RBC: 5.13 MIL/uL — ABNORMAL HIGH (ref 3.87–5.11)
RDW: 13.6 % (ref 11.5–15.5)
WBC Count: 10.7 10*3/uL — ABNORMAL HIGH (ref 4.0–10.5)
nRBC: 0 % (ref 0.0–0.2)

## 2019-10-14 LAB — CMP (CANCER CENTER ONLY)
ALT: 13 U/L (ref 0–44)
AST: 20 U/L (ref 15–41)
Albumin: 3.7 g/dL (ref 3.5–5.0)
Alkaline Phosphatase: 97 U/L (ref 38–126)
Anion gap: 6 (ref 5–15)
BUN: 11 mg/dL (ref 8–23)
CO2: 23 mmol/L (ref 22–32)
Calcium: 9.2 mg/dL (ref 8.9–10.3)
Chloride: 110 mmol/L (ref 98–111)
Creatinine: 0.74 mg/dL (ref 0.44–1.00)
GFR, Est AFR Am: >60 mL/min (ref >60)
GFR, Estimated: >60 mL/min (ref >60)
Glucose, Bld: 174 mg/dL — ABNORMAL HIGH (ref 70–99)
Potassium: 3.7 mmol/L (ref 3.5–5.1)
Sodium: 139 mmol/L (ref 135–145)
Total Bilirubin: 0.3 mg/dL (ref 0.3–1.2)
Total Protein: 7.7 g/dL (ref 6.5–8.1)

## 2019-10-14 MED ORDER — SODIUM CHLORIDE 0.9 % IV SOLN
600.0000 mg/m2 | Freq: Once | INTRAVENOUS | Status: AC
  Administered 2019-10-14: 15:00:00 1260 mg via INTRAVENOUS
  Filled 2019-10-14: qty 63

## 2019-10-14 MED ORDER — DEXAMETHASONE SODIUM PHOSPHATE 10 MG/ML IJ SOLN
INTRAMUSCULAR | Status: AC
  Filled 2019-10-14: qty 1

## 2019-10-14 MED ORDER — PALONOSETRON HCL INJECTION 0.25 MG/5ML
INTRAVENOUS | Status: AC
  Filled 2019-10-14: qty 5

## 2019-10-14 MED ORDER — SODIUM CHLORIDE 0.9 % IV SOLN
75.0000 mg/m2 | Freq: Once | INTRAVENOUS | Status: AC
  Administered 2019-10-14: 160 mg via INTRAVENOUS
  Filled 2019-10-14: qty 16

## 2019-10-14 MED ORDER — SODIUM CHLORIDE 0.9 % IV SOLN
Freq: Once | INTRAVENOUS | Status: AC
  Filled 2019-10-14: qty 250

## 2019-10-14 MED ORDER — PEGFILGRASTIM 6 MG/0.6ML ~~LOC~~ PSKT
PREFILLED_SYRINGE | SUBCUTANEOUS | Status: AC
  Filled 2019-10-14: qty 0.6

## 2019-10-14 MED ORDER — HEPARIN SOD (PORK) LOCK FLUSH 100 UNIT/ML IV SOLN
500.0000 [IU] | Freq: Once | INTRAVENOUS | Status: AC | PRN
  Administered 2019-10-14: 500 [IU]
  Filled 2019-10-14: qty 5

## 2019-10-14 MED ORDER — PALONOSETRON HCL INJECTION 0.25 MG/5ML
0.2500 mg | Freq: Once | INTRAVENOUS | Status: AC
  Administered 2019-10-14: 0.25 mg via INTRAVENOUS

## 2019-10-14 MED ORDER — SODIUM CHLORIDE 0.9 % IV SOLN
10.0000 mg | Freq: Once | INTRAVENOUS | Status: AC
  Administered 2019-10-14: 10 mg via INTRAVENOUS
  Filled 2019-10-14: qty 10

## 2019-10-14 MED ORDER — SODIUM CHLORIDE 0.9% FLUSH
10.0000 mL | INTRAVENOUS | Status: AC | PRN
  Administered 2019-10-14: 10 mL via INTRAVENOUS
  Filled 2019-10-14: qty 10

## 2019-10-14 MED ORDER — SODIUM CHLORIDE 0.9% FLUSH
10.0000 mL | INTRAVENOUS | Status: DC | PRN
  Administered 2019-10-14: 10 mL
  Filled 2019-10-14: qty 10

## 2019-10-14 MED ORDER — PEGFILGRASTIM 6 MG/0.6ML ~~LOC~~ PSKT
6.0000 mg | PREFILLED_SYRINGE | Freq: Once | SUBCUTANEOUS | Status: AC
  Administered 2019-10-14: 6 mg via SUBCUTANEOUS

## 2019-10-14 MED ORDER — HEPARIN SOD (PORK) LOCK FLUSH 100 UNIT/ML IV SOLN
500.0000 [IU] | Freq: Once | INTRAVENOUS | Status: AC
  Filled 2019-10-14: qty 5

## 2019-10-14 NOTE — Patient Instructions (Signed)
[facility] Discharge Instructions for Patients Receiving Chemotherapy  Today you received the following chemotherapy agents: Taxotere, Carboplatin  To help prevent nausea and vomiting after your treatment, we encourage you to take your nausea medication as directed.   If you develop nausea and vomiting that is not controlled by your nausea medication, call the clinic.   BELOW ARE SYMPTOMS THAT SHOULD BE REPORTED IMMEDIATELY:  *FEVER GREATER THAN 100.5 F  *CHILLS WITH OR WITHOUT FEVER  NAUSEA AND VOMITING THAT IS NOT CONTROLLED WITH YOUR NAUSEA MEDICATION  *UNUSUAL SHORTNESS OF BREATH  *UNUSUAL BRUISING OR BLEEDING  TENDERNESS IN MOUTH AND THROAT WITH OR WITHOUT PRESENCE OF ULCERS  *URINARY PROBLEMS  *BOWEL PROBLEMS  UNUSUAL RASH Items with * indicate a potential emergency and should be followed up as soon as possible.  Feel free to call the clinic should you have any questions or concerns. The clinic phone number is [phone].  Please show the [facility] at check-in to the Emergency Department and triage nurse.  Docetaxel injection What is this medicine? DOCETAXEL (doe se TAX el) is a chemotherapy drug. It targets fast dividing cells, like cancer cells, and causes these cells to die. This medicine is used to treat many types of cancers like breast cancer, certain stomach cancers, head and neck cancer, lung cancer, and prostate cancer. This medicine may be used for other purposes; ask your health care provider or pharmacist if you have questions. COMMON BRAND NAME(S): Docefrez, Taxotere What should I tell my health care provider before I take this medicine? They need to know if you have any of these conditions:  infection (especially a virus infection such as chickenpox, cold sores, or herpes)  liver disease  low blood counts, like low white cell, platelet, or red cell counts  an unusual or allergic reaction to docetaxel, polysorbate 80, other  chemotherapy agents, other medicines, foods, dyes, or preservatives  pregnant or trying to get pregnant  breast-feeding How should I use this medicine? This drug is given as an infusion into a vein. It is administered in a hospital or clinic by a specially trained health care professional. Talk to your pediatrician regarding the use of this medicine in children. Special care may be needed. Overdosage: If you think you have taken too much of this medicine contact a poison control center or emergency room at once. NOTE: This medicine is only for you. Do not share this medicine with others. What if I miss a dose? It is important not to miss your dose. Call your doctor or health care professional if you are unable to keep an appointment. What may interact with this medicine?  aprepitant  certain antibiotics like erythromycin or clarithromycin  certain antivirals for HIV or hepatitis  certain medicines for fungal infections like fluconazole, itraconazole, ketoconazole, posaconazole, or voriconazole  cimetidine  ciprofloxacin  conivaptan  cyclosporine  dronedarone  fluvoxamine  grapefruit juice  imatinib  verapamil This list may not describe all possible interactions. Give your health care provider a list of all the medicines, herbs, non-prescription drugs, or dietary supplements you use. Also tell them if you smoke, drink alcohol, or use illegal drugs. Some items may interact with your medicine. What should I watch for while using this medicine? Your condition will be monitored carefully while you are receiving this medicine. You will need important blood work done while you are taking this medicine. Call your doctor or health care professional for advice if you get a fever, chills  or sore throat, or other symptoms of a cold or flu. Do not treat yourself. This drug decreases your body's ability to fight infections. Try to avoid being around people who are sick. Some products may  contain alcohol. Ask your health care professional if this medicine contains alcohol. Be sure to tell all health care professionals you are taking this medicine. Certain medicines, like metronidazole and disulfiram, can cause an unpleasant reaction when taken with alcohol. The reaction includes flushing, headache, nausea, vomiting, sweating, and increased thirst. The reaction can last from 30 minutes to several hours. You may get drowsy or dizzy. Do not drive, use machinery, or do anything that needs mental alertness until you know how this medicine affects you. Do not stand or sit up quickly, especially if you are an older patient. This reduces the risk of dizzy or fainting spells. Alcohol may interfere with the effect of this medicine. Talk to your health care professional about your risk of cancer. You may be more at risk for certain types of cancer if you take this medicine. Do not become pregnant while taking this medicine or for 6 months after stopping it. Women should inform their doctor if they wish to become pregnant or think they might be pregnant. There is a potential for serious side effects to an unborn child. Talk to your health care professional or pharmacist for more information. Do not breast-feed an infant while taking this medicine or for 1 week after stopping it. Males who get this medicine must use a condom during sex with females who can get pregnant. If you get a woman pregnant, the baby could have birth defects. The baby could die before they are born. You will need to continue wearing a condom for 3 months after stopping the medicine. Tell your health care provider right away if your partner becomes pregnant while you are taking this medicine. This may interfere with the ability to father a child. You should talk to your doctor or health care professional if you are concerned about your fertility. What side effects may I notice from receiving this medicine? Side effects that you  should report to your doctor or health care professional as soon as possible:  allergic reactions like skin rash, itching or hives, swelling of the face, lips, or tongue  blurred vision  breathing problems  changes in vision  low blood counts - This drug may decrease the number of white blood cells, red blood cells and platelets. You may be at increased risk for infections and bleeding.  nausea and vomiting  pain, redness or irritation at site where injected  pain, tingling, numbness in the hands or feet  redness, blistering, peeling, or loosening of the skin, including inside the mouth  signs of decreased platelets or bleeding - bruising, pinpoint red spots on the skin, black, tarry stools, nosebleeds  signs of decreased red blood cells - unusually weak or tired, fainting spells, lightheadedness  signs of infection - fever or chills, cough, sore throat, pain or difficulty passing urine  swelling of the ankle, feet, hands Side effects that usually do not require medical attention (report to your doctor or health care professional if they continue or are bothersome):  constipation  diarrhea  fingernail or toenail changes  hair loss  loss of appetite  mouth sores  muscle pain This list may not describe all possible side effects. Call your doctor for medical advice about side effects. You may report side effects to FDA at 1-800-FDA-1088. Where  should I keep my medicine? This drug is given in a hospital or clinic and will not be stored at home. NOTE: This sheet is a summary. It may not cover all possible information. If you have questions about this medicine, talk to your doctor, pharmacist, or health care provider.  2020 Elsevier/Gold Standard (2019-02-17 10:19:06)  Cyclophosphamide Injection What is this medicine? CYCLOPHOSPHAMIDE (sye kloe FOSS fa mide) is a chemotherapy drug. It slows the growth of cancer cells. This medicine is used to treat many types of cancer  like lymphoma, myeloma, leukemia, breast cancer, and ovarian cancer, to name a few. This medicine may be used for other purposes; ask your health care provider or pharmacist if you have questions. COMMON BRAND NAME(S): Cytoxan, Neosar What should I tell my health care provider before I take this medicine? They need to know if you have any of these conditions:  heart disease  history of irregular heartbeat  infection  kidney disease  liver disease  low blood counts, like white cells, platelets, or red blood cells  on hemodialysis  recent or ongoing radiation therapy  scarring or thickening of the lungs  trouble passing urine  an unusual or allergic reaction to cyclophosphamide, other medicines, foods, dyes, or preservatives  pregnant or trying to get pregnant  breast-feeding How should I use this medicine? This drug is usually given as an injection into a vein or muscle or by infusion into a vein. It is administered in a hospital or clinic by a specially trained health care professional. Talk to your pediatrician regarding the use of this medicine in children. Special care may be needed. Overdosage: If you think you have taken too much of this medicine contact a poison control center or emergency room at once. NOTE: This medicine is only for you. Do not share this medicine with others. What if I miss a dose? It is important not to miss your dose. Call your doctor or health care professional if you are unable to keep an appointment. What may interact with this medicine?  amphotericin B  azathioprine  certain antivirals for HIV or hepatitis  certain medicines for blood pressure, heart disease, irregular heart beat  certain medicines that treat or prevent blood clots like warfarin  certain other medicines for cancer  cyclosporine  etanercept  indomethacin  medicines that relax muscles for surgery  medicines to increase blood counts  metronidazole This list  may not describe all possible interactions. Give your health care provider a list of all the medicines, herbs, non-prescription drugs, or dietary supplements you use. Also tell them if you smoke, drink alcohol, or use illegal drugs. Some items may interact with your medicine. What should I watch for while using this medicine? Your condition will be monitored carefully while you are receiving this medicine. You may need blood work done while you are taking this medicine. Drink water or other fluids as directed. Urinate often, even at night. Some products may contain alcohol. Ask your health care professional if this medicine contains alcohol. Be sure to tell all health care professionals you are taking this medicine. Certain medicines, like metronidazole and disulfiram, can cause an unpleasant reaction when taken with alcohol. The reaction includes flushing, headache, nausea, vomiting, sweating, and increased thirst. The reaction can last from 30 minutes to several hours. Do not become pregnant while taking this medicine or for 1 year after stopping it. Women should inform their health care professional if they wish to become pregnant or think they might  be pregnant. Men should not father a child while taking this medicine and for 4 months after stopping it. There is potential for serious side effects to an unborn child. Talk to your health care professional for more information. Do not breast-feed an infant while taking this medicine or for 1 week after stopping it. This medicine has caused ovarian failure in some women. This medicine may make it more difficult to get pregnant. Talk to your health care professional if you are concerned about your fertility. This medicine has caused decreased sperm counts in some men. This may make it more difficult to father a child. Talk to your health care professional if you are concerned about your fertility. Call your health care professional for advice if you get a  fever, chills, or sore throat, or other symptoms of a cold or flu. Do not treat yourself. This medicine decreases your body's ability to fight infections. Try to avoid being around people who are sick. Avoid taking medicines that contain aspirin, acetaminophen, ibuprofen, naproxen, or ketoprofen unless instructed by your health care professional. These medicines may hide a fever. Talk to your health care professional about your risk of cancer. You may be more at risk for certain types of cancer if you take this medicine. If you are going to need surgery or other procedure, tell your health care professional that you are using this medicine. Be careful brushing or flossing your teeth or using a toothpick because you may get an infection or bleed more easily. If you have any dental work done, tell your dentist you are receiving this medicine. What side effects may I notice from receiving this medicine? Side effects that you should report to your doctor or health care professional as soon as possible:  allergic reactions like skin rash, itching or hives, swelling of the face, lips, or tongue  breathing problems  nausea, vomiting  signs and symptoms of bleeding such as bloody or black, tarry stools; red or dark brown urine; spitting up blood or brown material that looks like coffee grounds; red spots on the skin; unusual bruising or bleeding from the eyes, gums, or nose  signs and symptoms of heart failure like fast, irregular heartbeat, sudden weight gain; swelling of the ankles, feet, hands  signs and symptoms of infection like fever; chills; cough; sore throat; pain or trouble passing urine  signs and symptoms of kidney injury like trouble passing urine or change in the amount of urine  signs and symptoms of liver injury like dark yellow or brown urine; general ill feeling or flu-like symptoms; light-colored stools; loss of appetite; nausea; right upper belly pain; unusually weak or tired;  yellowing of the eyes or skin Side effects that usually do not require medical attention (report to your doctor or health care professional if they continue or are bothersome):  confusion  decreased hearing  diarrhea  facial flushing  hair loss  headache  loss of appetite  missed menstrual periods  signs and symptoms of low red blood cells or anemia such as unusually weak or tired; feeling faint or lightheaded; falls  skin discoloration This list may not describe all possible side effects. Call your doctor for medical advice about side effects. You may report side effects to FDA at 1-800-FDA-1088. Where should I keep my medicine? This drug is given in a hospital or clinic and will not be stored at home. NOTE: This sheet is a summary. It may not cover all possible information. If you have questions  about this medicine, talk to your doctor, pharmacist, or health care provider.  2020 Elsevier/Gold Standard (2019-03-28 09:53:29)  Pegfilgrastim injection What is this medicine? PEGFILGRASTIM (PEG fil gra stim) is a long-acting granulocyte colony-stimulating factor that stimulates the growth of neutrophils, a type of white blood cell important in the body's fight against infection. It is used to reduce the incidence of fever and infection in patients with certain types of cancer who are receiving chemotherapy that affects the bone marrow, and to increase survival after being exposed to high doses of radiation. This medicine may be used for other purposes; ask your health care provider or pharmacist if you have questions. COMMON BRAND NAME(S): Steve Rattler, Ziextenzo What should I tell my health care provider before I take this medicine? They need to know if you have any of these conditions:  kidney disease  latex allergy  ongoing radiation therapy  sickle cell disease  skin reactions to acrylic adhesives (On-Body Injector only)  an unusual or allergic reaction  to pegfilgrastim, filgrastim, other medicines, foods, dyes, or preservatives  pregnant or trying to get pregnant  breast-feeding How should I use this medicine? This medicine is for injection under the skin. If you get this medicine at home, you will be taught how to prepare and give the pre-filled syringe or how to use the On-body Injector. Refer to the patient Instructions for Use for detailed instructions. Use exactly as directed. Tell your healthcare provider immediately if you suspect that the On-body Injector may not have performed as intended or if you suspect the use of the On-body Injector resulted in a missed or partial dose. It is important that you put your used needles and syringes in a special sharps container. Do not put them in a trash can. If you do not have a sharps container, call your pharmacist or healthcare provider to get one. Talk to your pediatrician regarding the use of this medicine in children. While this drug may be prescribed for selected conditions, precautions do apply. Overdosage: If you think you have taken too much of this medicine contact a poison control center or emergency room at once. NOTE: This medicine is only for you. Do not share this medicine with others. What if I miss a dose? It is important not to miss your dose. Call your doctor or health care professional if you miss your dose. If you miss a dose due to an On-body Injector failure or leakage, a new dose should be administered as soon as possible using a single prefilled syringe for manual use. What may interact with this medicine? Interactions have not been studied. Give your health care provider a list of all the medicines, herbs, non-prescription drugs, or dietary supplements you use. Also tell them if you smoke, drink alcohol, or use illegal drugs. Some items may interact with your medicine. This list may not describe all possible interactions. Give your health care provider a list of all the  medicines, herbs, non-prescription drugs, or dietary supplements you use. Also tell them if you smoke, drink alcohol, or use illegal drugs. Some items may interact with your medicine. What should I watch for while using this medicine? You may need blood work done while you are taking this medicine. If you are going to need a MRI, CT scan, or other procedure, tell your doctor that you are using this medicine (On-Body Injector only). What side effects may I notice from receiving this medicine? Side effects that you should report to your doctor  or health care professional as soon as possible:  allergic reactions like skin rash, itching or hives, swelling of the face, lips, or tongue  back pain  dizziness  fever  pain, redness, or irritation at site where injected  pinpoint red spots on the skin  red or dark-brown urine  shortness of breath or breathing problems  stomach or side pain, or pain at the shoulder  swelling  tiredness  trouble passing urine or change in the amount of urine Side effects that usually do not require medical attention (report to your doctor or health care professional if they continue or are bothersome):  bone pain  muscle pain This list may not describe all possible side effects. Call your doctor for medical advice about side effects. You may report side effects to FDA at 1-800-FDA-1088. Where should I keep my medicine? Keep out of the reach of children. If you are using this medicine at home, you will be instructed on how to store it. Throw away any unused medicine after the expiration date on the label. NOTE: This sheet is a summary. It may not cover all possible information. If you have questions about this medicine, talk to your doctor, pharmacist, or health care provider.  2020 Elsevier/Gold Standard (2017-09-28 16:57:08)

## 2019-10-14 NOTE — Assessment & Plan Note (Signed)
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   Treatment plan: 1. Adjuvant chemotherapy withTaxotere and Cytoxan every 3 weeks x4 2.Adjuvant radiation therapy 3.Follow-up adjuvant antiestrogen therapy with anastrozole 1 mg daily x7 years Genetic testing ------------------------------------------------------------------------------------------------------ Current treatment: Cycle 1 Taxotere and Cytoxan Labs reviewed, antiemetics reviewed, chemo education completed, chemo consent obtained. Return to clinic in 1 week for toxicity check

## 2019-10-17 ENCOUNTER — Encounter: Payer: Self-pay | Admitting: Plastic Surgery

## 2019-10-17 ENCOUNTER — Telehealth: Payer: Self-pay | Admitting: *Deleted

## 2019-10-18 NOTE — Progress Notes (Signed)
Patient is a 64-yr-old female here for follow up after undergoing a right mastectomy with Dr. [name] and immediate first stage reconstruction with placement of tissue expander with Dr. Marla Roe on 09/08/19. Started Chemotherapy on 10/14/19. Will have 4 cycles every 3 weeks; then will begin radiation. We would like to target surgery for exchange to implants mid-late May.  Reports overall she feels she is doing well.  Just had her first chemo treatment on the ninth and is just starting to feel a little less foggy and beginning to get her energy back.  Reports she did well with her last fill, had a few days of muscle tightness and spasm.  Valium appears to keep this well controlled.  Is interested in additional fill today.  Would like to discuss options with Dr. Marla Roe at her next fill visit to address asymmetry from her natural breast to what will be the reconstructed breast.  She is interested in exploring options for a left breast reduction.  denies fever, chest pain, shortness of breath, N/V.  Incision is healing very well, C/D/I.  No signs of infection, drainage, redness, seroma/hematoma.  She is going to begin attending lymphedema therapy on Friday.  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  Follow-up in 2 weeks with Dr. Marla Roe for additional fill and discuss options to address asymmetry.  Call office with any questions/concerns.  The [place] was signed into law in 2016 which includes the topic of electronic health records.  This provides immediate access to information in MyChart.  This includes consultation notes, operative notes, office notes, lab results and pathology reports.  If you have any questions about what you read please let us know at your next visit or call us at the office.  We are right here with you.

## 2019-10-19 ENCOUNTER — Other Ambulatory Visit: Payer: Self-pay

## 2019-10-19 ENCOUNTER — Ambulatory Visit (INDEPENDENT_AMBULATORY_CARE_PROVIDER_SITE_OTHER): Payer: 59 | Admitting: Plastic Surgery

## 2019-10-19 ENCOUNTER — Encounter: Payer: Self-pay | Admitting: Plastic Surgery

## 2019-10-19 VITALS — BP 114/78 | HR 92 | Temp 98.0°F | Ht 63.0 in | Wt 214.0 lb

## 2019-10-19 DIAGNOSIS — Z9011 Acquired absence of right breast and nipple: Secondary | ICD-10-CM

## 2019-10-19 DIAGNOSIS — Z170 Estrogen receptor positive status [ER+]: Secondary | ICD-10-CM

## 2019-10-19 DIAGNOSIS — C50411 Malignant neoplasm of upper-outer quadrant of right female breast: Secondary | ICD-10-CM

## 2019-10-19 NOTE — Progress Notes (Signed)
Patient Care Team: [name], MD as PCP - General (Family Medicine) Mauro Kaufmann, RN as Oncology Nurse Navigator Rockwell Germany, RN as Oncology Nurse Navigator  DIAGNOSIS:    ICD-10-CM   1. Malignant neoplasm of upper-outer quadrant of right breast in female, estrogen receptor positive ([place])  C50.411    Z17.0     SUMMARY OF ONCOLOGIC HISTORY: Oncology History  Malignant neoplasm of upper-outer quadrant of right breast in female, estrogen receptor positive ([name])  05/20/2019 Initial Diagnosis   Palpable right breast mass superficial, ultrasound 11 o'clock position 1.7 cm: Biopsy revealed grade 3 IDC with DCIS ER 100%, PR 30%, Ki-67 20%, HER-2 by IHC negative, axilla lymph node biopsy positive   06/01/2019 Cancer Staging   Staging form: Breast, AJCC 8th Edition - Clinical stage from 06/01/2019: Stage IIA (cT1c, cN1, cM0, G3, ER+, PR+, HER2-) - Signed by Nicholas Lose, MD on 06/01/2019   06/27/2019 Surgery   Right lumpectomy [name]): IDC, grade 3, at least 2.5cm, involving a complex sclerosing lesion, 1.1cm, with high grade DCIS, involved margins, 1/3 right axillary lymph nodes positive, 1.4cm.    07/11/2019 Cancer Staging   Staging form: Breast, AJCC 8th Edition - Pathologic stage from 07/11/2019: Stage IIA (pT2, pN1a, cM0, G3, ER+, PR+, HER2-) - Signed by Nicholas Lose, MD on 07/11/2019   07/19/2019 Oncotype testing   Mammaprint: high risk, 29% chance of recurrence in 10 years without systemic treatment.    09/08/2019 Surgery   Re-excision x2 (08/01/19 and 08/12/19) followed by right mastectomy [name]): residual carcinoma and DCIS, 1/5 lymph nodes positive, clear margins.    10/14/2019 -  Chemotherapy   The patient had palonosetron (ALOXI) injection 0.25 mg, 0.25 mg, Intravenous,  Once, 1 of 4 cycles Administration: 0.25 mg (10/14/2019) pegfilgrastim (NEULASTA ONPRO KIT) injection 6 mg, 6 mg, Subcutaneous, Once, 1 of 4 cycles Administration: 6 mg (10/14/2019) cyclophosphamide  (CYTOXAN) 1,260 mg in sodium chloride 0.9 % 250 mL chemo infusion, 600 mg/m2 = 1,260 mg, Intravenous,  Once, 1 of 4 cycles Administration: 1,260 mg (10/14/2019) DOCEtaxel (TAXOTERE) 160 mg in sodium chloride 0.9 % 250 mL chemo infusion, 75 mg/m2 = 160 mg, Intravenous,  Once, 1 of 4 cycles Administration: 160 mg (10/14/2019)  for chemotherapy treatment.      CHIEF COMPLIANT: Cycle 1 Day 8 Taxotere and Cytoxan  INTERVAL HISTORY: Wendy [name] is a 64 y.o. with above-mentioned history of right breast cancer who underwent a lumpectomy followed by re-excision and then a mastectomy and is currently on adjuvant chemotherapy with Taxotere and Cytoxan. She presents to the clinic today for a toxicity check following cycle 1.    ALLERGIES:  is allergic to vicodin [hydrocodone-acetaminophen] and brimonidine.  MEDICATIONS:  Current Outpatient Medications  Medication Sig Dispense Refill  . dexamethasone (DECADRON) 4 MG tablet Take 1 tablet (4 mg total) by mouth daily. Takes 1 tab day before chemo and 1 tab day after chemo with food. 8 tablet 0  . diazepam (VALIUM) 2 MG tablet Take 1 tablet (2 mg total) by mouth every 12 (twelve) hours as needed for muscle spasms. 20 tablet 0  . dorzolamide-timolol (COSOPT) 22.3-6.8 MG/ML ophthalmic solution PLACE 1 DROP INTO BOTH EYES 2 (TWO) TIMES DAILY.    Marland Kitchen latanoprost (XALATAN) 0.005 % ophthalmic solution 1 drop at bedtime.    . lidocaine-prilocaine (EMLA) cream Apply to affected area once 30 g 3  . LORazepam (ATIVAN) 0.5 MG tablet Take 1 tablet (0.5 mg total) by mouth at bedtime as  needed for sleep. 30 tablet 0  . ondansetron (ZOFRAN) 8 MG tablet Take 1 tablet (8 mg total) by mouth 2 (two) times daily as needed for refractory nausea / vomiting. Start on day 3 after chemo. (Patient not taking: Reported on 10/19/2019) 30 tablet 1  . prochlorperazine (COMPAZINE) 10 MG tablet Take 1 tablet (10 mg total) by mouth every 6 (six) hours as needed (Nausea or vomiting). 30 tablet  1  . venlafaxine XR (EFFEXOR-XR) 37.5 MG 24 hr capsule TAKE 1 CAPSULE BY MOUTH EVERY DAY WITH BREAKFAST. MAY INCREASE TO 2 CAPS DAILY IF NEEDED 90 capsule 2   No current facility-administered medications for this visit.   Facility-Administered Medications Ordered in Other Visits  Medication Dose Route Frequency Provider Last Rate Last Admin  . heparin lock flush 100 unit/mL  500 Units Intravenous Once Nicholas Lose, MD      . sodium chloride flush (NS) 0.9 % injection 10 mL  10 mL Intravenous PRN Nicholas Lose, MD   10 mL at 10/14/19 1032    PHYSICAL EXAMINATION: ECOG PERFORMANCE STATUS: 1 - Symptomatic but completely ambulatory  There were no vitals filed for this visit. There were no vitals filed for this visit.  LABORATORY DATA:  I have reviewed the data as listed CMP Latest Ref Rng & Units 10/14/2019 04/30/2018 08/31/2017  Glucose 70 - 99 mg/dL 174(H) 91 98  BUN 8 - 23 mg/dL '11 9 10  ' Creatinine 0.44 - 1.00 mg/dL 0.74 0.75 0.72  Sodium 135 - 145 mmol/L 139 142 140  Potassium 3.5 - 5.1 mmol/L 3.7 3.7 4.1  Chloride 98 - 111 mmol/L 110 103 106  CO2 22 - 32 mmol/L '23 24 21  ' Calcium 8.9 - 10.3 mg/dL 9.2 9.2 9.4  Total Protein 6.5 - 8.1 g/dL 7.7 7.0 7.3  Total Bilirubin 0.3 - 1.2 mg/dL 0.3 0.6 0.3  Alkaline Phos 38 - 126 U/L 97 74 77  AST 15 - 41 U/L '20 22 17  ' ALT 0 - 44 U/L '13 12 11    ' Lab Results  Component Value Date   WBC 10.7 (H) 10/14/2019   HGB 14.2 10/14/2019   HCT 44.8 10/14/2019   MCV 87.3 10/14/2019   PLT 196 10/14/2019   NEUTROABS 7.0 10/14/2019    ASSESSMENT & PLAN:  Malignant neoplasm of upper-outer quadrant of right breast in female, estrogen receptor positive ([name]) 06/27/2019:Right lumpectomy [name]): IDC, grade 3, at least 2.5cm, involving a complex sclerosing lesion, 1.1cm, with high grade DCIS, involved margins, 1/3 right axillary lymph nodes positive, 1.4cm.ER 100%, PR 30%, Ki-67 20%, HER-2 IHC negative T2 N1 stage IIA MammaPrint: High risk, 10-year  risk of recurrence untreated: 29% 09/08/2019:Re-excision x2 (08/01/19 and 08/12/19) followed by right mastectomy [name]): residual carcinoma and DCIS, 1/5 lymph nodes positive, clear margins.  Treatment plan: 1.Adjuvant chemotherapy withTaxotere and Cytoxan every 3 weeks x4 2.Adjuvant radiation therapy 3.Follow-up adjuvant antiestrogen therapywith anastrozole 1 mg daily x7 years Genetic testing ------------------------------------------------------------------------------------------------------ Current treatment: Cycle 1 day 8 Taxotere and Cytoxan Chemo toxicities: 1.  Thrush: I sent a prescription for Diflucan 2.  Fatigue: Started on day 3 3.  Muscle cramps still persisting  Denies any nausea or vomiting. Denies neuropathy.  Return to clinic in 2 weeks for cycle 2     No orders of the defined types were placed in this encounter.  The patient has a good understanding of the overall plan. she agrees with it. she will call with any problems that may develop before the next  visit here.  Total time spent: 30 mins including face to face time and time spent for planning, charting and coordination of care  Nicholas Lose, MD 10/20/2019  I, Cloyde Reams Dorshimer, am acting as scribe for Dr. Nicholas Lose.  I have reviewed the above documentation for accuracy and completeness, and I agree with the above.

## 2019-10-20 ENCOUNTER — Other Ambulatory Visit: Payer: Self-pay | Admitting: Plastic Surgery

## 2019-10-20 ENCOUNTER — Inpatient Hospital Stay (HOSPITAL_BASED_OUTPATIENT_CLINIC_OR_DEPARTMENT_OTHER): Payer: No Typology Code available for payment source | Admitting: Hematology and Oncology

## 2019-10-20 ENCOUNTER — Telehealth: Payer: Self-pay | Admitting: Plastic Surgery

## 2019-10-20 ENCOUNTER — Inpatient Hospital Stay: Payer: No Typology Code available for payment source

## 2019-10-20 ENCOUNTER — Other Ambulatory Visit (INDEPENDENT_AMBULATORY_CARE_PROVIDER_SITE_OTHER): Payer: 59 | Admitting: Plastic Surgery

## 2019-10-20 ENCOUNTER — Encounter: Payer: Self-pay | Admitting: *Deleted

## 2019-10-20 ENCOUNTER — Other Ambulatory Visit: Payer: Self-pay

## 2019-10-20 DIAGNOSIS — Z170 Estrogen receptor positive status [ER+]: Secondary | ICD-10-CM | POA: Diagnosis not present

## 2019-10-20 DIAGNOSIS — Z95828 Presence of other vascular implants and grafts: Secondary | ICD-10-CM | POA: Insufficient documentation

## 2019-10-20 DIAGNOSIS — C50411 Malignant neoplasm of upper-outer quadrant of right female breast: Secondary | ICD-10-CM

## 2019-10-20 DIAGNOSIS — Z5111 Encounter for antineoplastic chemotherapy: Secondary | ICD-10-CM | POA: Diagnosis not present

## 2019-10-20 LAB — CBC WITH DIFFERENTIAL (CANCER CENTER ONLY)
Abs Immature Granulocytes: 0.13 10*3/uL — ABNORMAL HIGH (ref 0.00–0.07)
Basophils Absolute: 0.1 10*3/uL (ref 0.0–0.1)
Basophils Relative: 2 %
Eosinophils Absolute: 0 10*3/uL (ref 0.0–0.5)
Eosinophils Relative: 1 %
HCT: 41.5 % (ref 36.0–46.0)
Hemoglobin: 13.3 g/dL (ref 12.0–15.0)
Immature Granulocytes: 5 %
Lymphocytes Relative: 69 %
Lymphs Abs: 2 10*3/uL (ref 0.7–4.0)
MCH: 27.4 pg (ref 26.0–34.0)
MCHC: 32 g/dL (ref 30.0–36.0)
MCV: 85.6 fL (ref 80.0–100.0)
Monocytes Absolute: 0.7 10*3/uL (ref 0.1–1.0)
Monocytes Relative: 22 %
Neutro Abs: 0 10*3/uL — CL (ref 1.7–7.7)
Neutrophils Relative %: 1 %
Platelet Count: 93 10*3/uL — ABNORMAL LOW (ref 150–400)
RBC: 4.85 MIL/uL (ref 3.87–5.11)
RDW: 13.4 % (ref 11.5–15.5)
WBC Count: 2.9 10*3/uL — ABNORMAL LOW (ref 4.0–10.5)
nRBC: 0 % (ref 0.0–0.2)

## 2019-10-20 LAB — CMP (CANCER CENTER ONLY)
ALT: 26 U/L (ref 0–44)
AST: 26 U/L (ref 15–41)
Albumin: 3.6 g/dL (ref 3.5–5.0)
Alkaline Phosphatase: 106 U/L (ref 38–126)
Anion gap: 9 (ref 5–15)
BUN: 6 mg/dL — ABNORMAL LOW (ref 8–23)
CO2: 26 mmol/L (ref 22–32)
Calcium: 9.2 mg/dL (ref 8.9–10.3)
Chloride: 106 mmol/L (ref 98–111)
Creatinine: 0.71 mg/dL (ref 0.44–1.00)
GFR, Est AFR Am: >60 mL/min (ref >60)
GFR, Estimated: >60 mL/min (ref >60)
Glucose, Bld: 112 mg/dL — ABNORMAL HIGH (ref 70–99)
Potassium: 3.9 mmol/L (ref 3.5–5.1)
Sodium: 141 mmol/L (ref 135–145)
Total Bilirubin: 0.9 mg/dL (ref 0.3–1.2)
Total Protein: 7.2 g/dL (ref 6.5–8.1)

## 2019-10-20 MED ORDER — HEPARIN SOD (PORK) LOCK FLUSH 100 UNIT/ML IV SOLN
500.0000 [IU] | Freq: Once | INTRAVENOUS | Status: AC | PRN
  Administered 2019-10-20: 500 [IU]
  Filled 2019-10-20: qty 5

## 2019-10-20 MED ORDER — DIAZEPAM 2 MG PO TABS: 2.0000 mg | ORAL_TABLET | Freq: Two times a day (BID) | ORAL | 0 refills | Status: DC | PRN

## 2019-10-20 MED ORDER — SODIUM CHLORIDE 0.9% FLUSH
10.0000 mL | INTRAVENOUS | Status: DC | PRN
  Administered 2019-10-20: 10 mL
  Filled 2019-10-20: qty 10

## 2019-10-20 MED ORDER — FLUCONAZOLE 100 MG PO TABS: 100.0000 mg | ORAL_TABLET | Freq: Every day | ORAL | 0 refills | Status: DC

## 2019-10-20 NOTE — Progress Notes (Signed)
Sent new Rx to pharmacy for valium for muscle spasms after tissue expander fills (per patient request).

## 2019-10-20 NOTE — Assessment & Plan Note (Signed)
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  Treatment plan: 1.Adjuvant chemotherapy withTaxotere and Cytoxan every 3 weeks x4 2.Adjuvant radiation therapy 3.Follow-up adjuvant antiestrogen therapywith anastrozole 1 mg daily x7 years Genetic testing ------------------------------------------------------------------------------------------------------ Current treatment: Cycle 1 day 8 Taxotere and Cytoxan Chemo toxicities:  Return to clinic in 2 weeks for cycle 2

## 2019-10-20 NOTE — Progress Notes (Signed)
CRITICAL VALUE ALERT  Critical Value:  ANC 0.0  Date & Time Notied:  10/20/19 at 1200  Provider Notified: Nicholas Lose, MD  Orders Received/Actions taken: MD notified, no orders received.

## 2019-10-20 NOTE — Telephone Encounter (Signed)
Patient called to advise she used the last of her valium yesterday and that she was told to call back to request a refill on that. Please call patient to advise.

## 2019-10-21 ENCOUNTER — Ambulatory Visit: Payer: 59 | Attending: General Surgery | Admitting: Rehabilitation

## 2019-10-21 ENCOUNTER — Encounter: Payer: Self-pay | Admitting: Rehabilitation

## 2019-10-21 DIAGNOSIS — M25611 Stiffness of right shoulder, not elsewhere classified: Secondary | ICD-10-CM | POA: Insufficient documentation

## 2019-10-21 DIAGNOSIS — Z483 Aftercare following surgery for neoplasm: Secondary | ICD-10-CM

## 2019-10-21 DIAGNOSIS — I890 Lymphedema, not elsewhere classified: Secondary | ICD-10-CM | POA: Insufficient documentation

## 2019-10-21 NOTE — Therapy (Signed)
[place], [place], [address] Phone: [phone]   Fax:  [phone]  Physical Therapy Evaluation  Patient Details  Name: Wendy [name] MRN: [id number] Date of Birth: 1956/04/04 Referring Provider (PT): Dr. [name]   Encounter Date: 10/21/2019  PT End of Session - 10/21/19 1043    Visit Number  1    Number of Visits  9    Date for PT Re-Evaluation  11/25/19    PT Start Time  0805    PT Stop Time  [id number]    PT Time Calculation (min)  50 min    Activity Tolerance  Patient tolerated treatment well    Behavior During Therapy  [facility] for tasks assessed/performed       Past Medical History:  Diagnosis Date  . Breast cancer ([place])   . Colon cancer ([place])   . DDD (degenerative disc disease), cervical   . DDD (degenerative disc disease), lumbar   . ESOPHAGITIS, REFLUX   . Glaucoma   . HELICOBACTER PYLORI INFECTION   . HEMORRHOIDS, INTERNAL   . History of colon cancer   . History of colon polyps 2019  . Hyperlipidemia   . Radius fracture 2006   Left, Distal  . Thoracic spondylosis   . Tinnitus, bilateral 2013    Past Surgical History:  Procedure Laterality Date  . BREAST LUMPECTOMY WITH RADIOACTIVE SEED AND SENTINEL LYMPH NODE BIOPSY Right 06/27/2019   Procedure: RIGHT BREAST LUMPECTOMY WITH RADIOACTIVE SEED AND SENTINEL LYMPH NODE BIOPSY AND RIGHT TARGETED NODE DISSECTION;  Surgeon: Jovita Kussmaul, MD;  Location: [facility];  Service: General;  Laterality: Right;  . BREAST RECONSTRUCTION WITH PLACEMENT OF TISSUE EXPANDER AND FLEX HD (ACELLULAR HYDRATED DERMIS) Right 09/08/2019   Procedure: BREAST RECONSTRUCTION WITH PLACEMENT OF TISSUE EXPANDER AND FLEX HD (ACELLULAR HYDRATED DERMIS);  Surgeon: Wallace Going, DO;  Location: [facility];  Service: Plastics;  Laterality: Right;  . CESAREAN SECTION  1989  . COLONOSCOPY  2019  . HYSTEROSCOPY WITH D & C N/A 09/17/2018    Procedure: DILATATION AND CURETTAGE Marlene Bast;  Surgeon: Brien Few, MD;  Location: [facility];  Service: Gynecology;  Laterality: N/A;  . PORTACATH PLACEMENT Left 08/01/2019   Procedure: INSERTION PORT-A-CATH;  Surgeon: Jovita Kussmaul, MD;  Location: [place];  Service: General;  Laterality: Left;  . RE-EXCISION OF BREAST CANCER,SUPERIOR MARGINS Right 08/12/2019   Procedure: RE-EXCISION OF RIGHT BREAST,SUPERIOR MARGINS;  Surgeon: Jovita Kussmaul, MD;  Location: [facility];  Service: General;  Laterality: Right;  . RE-EXCISION OF BREAST LUMPECTOMY Right 08/01/2019   Procedure: RE-EXCISION OF RIGHT BREAST CANCER  MARGINS;  Surgeon: Jovita Kussmaul, MD;  Location: [facility];  Service: General;  Laterality: Right;  . SIMPLE MASTECTOMY WITH AXILLARY SENTINEL NODE BIOPSY Right 09/08/2019   Procedure: RIGHT BREAST MASTECTOMY;  Surgeon: Jovita Kussmaul, MD;  Location: [place];  Service: General;  Laterality: Right;  . TONSILLECTOMY     age 64 years old  . WRIST FRACTURE SURGERY  2006    There were no vitals filed for this visit.   Subjective Assessment - 10/21/19 0814    Subjective  i started to notice that my Rt arm is swelling and the numbness in the armpit. I am also feeling tight with movements    Pertinent History  Rt lumpectomy grade 3 DCIS on 06/27/19 with Dr. [name] and Re-excision on  08/12/19, then mastectomy with immediate expander placement and 8 lymph nodes removed Dr. Marla Roe for Plastics.  Currently getting chemotherapy of Cytoxan and Taxotere and will be having radiation when Chemotherapy is done.  1/4 chemo cycles complete.    Limitations  Lifting    Patient Stated Goals  decrease swelling and improve swelling    Currently in Pain?  No/denies         [facility] PT Assessment - 10/21/19 0001      Assessment   Medical Diagnosis  Rt breast cancer    Referring Provider (PT)  Dr. [name]    Onset  Date/Surgical Date  06/27/19    Hand Dominance  Right    Prior Therapy  no      Precautions   Precaution Comments  lymphedema      Balance Screen   Has the patient fallen in the past 6 months  No    Has the patient had a decrease in activity level because of a fear of falling?   No    Is the patient reluctant to leave their home because of a fear of falling?   No      Home Film/video editor residence    Living Arrangements  Spouse/significant other    Type of [place]      Prior Function   Level of Independence  Independent    Vocation  Full time employment    Loss adjuster, chartered    Leisure  nothing really now      Cognition   Overall Cognitive Status  Within Functional Limits for tasks assessed      Observation/Other Assessments   Observations  well healed axillary incision, healing mastectomy horizontal incision with expander in place and some inferior/lateral skin tightness evident      Sensation   Additional Comments  numbness Rt armpit and tricep region      Coordination   Gross Motor Movements are Fluid and Coordinated  Yes      Posture/Postural Control   Posture/Postural Control  Postural limitations    Postural Limitations  Rounded Shoulders;Forward head      ROM / Strength   AROM / PROM / Strength  AROM;PROM      AROM   AROM Assessment Site  Shoulder    Right/Left Shoulder  Right;Left    Right Shoulder Flexion  107 Degrees   pull   Right Shoulder ABduction  129 Degrees   pull   Right Shoulder Internal Rotation  33 Degrees    Right Shoulder External Rotation  110 Degrees    Right Shoulder Horizontal ABduction  10 Degrees   pull   Left Shoulder Flexion  155 Degrees    Left Shoulder ABduction  169 Degrees    Left Shoulder Internal Rotation  70 Degrees    Left Shoulder External Rotation  120 Degrees    Left Shoulder Horizontal ABduction  50 Degrees      Palpation   Palpation comment  no edema present Rt chest wall or  scapular regions, some puffiness in the Rt axilla due to SLNB, expander tightness evident with edges tight around the whole expander        LYMPHEDEMA/ONCOLOGY QUESTIONNAIRE - 10/21/19 0825      Type   Cancer Type  Rt breast      Treatment   Active Chemotherapy Treatment  Yes    Date  10/14/19    Past Chemotherapy Treatment  No  Active Radiation Treatment  No    Past Radiation Treatment  No    Current Hormone Treatment  No    Past Hormone Therapy  No      What other symptoms do you have   Are you Having Heaviness or Tightness  Yes      Lymphedema Assessments   Lymphedema Assessments  Upper extremities      Right Upper Extremity Lymphedema   15 cm Proximal to Olecranon Process  41.5 cm    10 cm Proximal to Olecranon Process  44.5 cm    Olecranon Process  34.8 cm    15 cm Proximal to Ulnar Styloid Process  31.5 cm    10 cm Proximal to Ulnar Styloid Process  26.5 cm    Just Proximal to Ulnar Styloid Process  18.9 cm    Across Hand at [employer]  19.7 cm    At [place] of 2nd Digit  6.7 cm    Other  8.0      Left Upper Extremity Lymphedema   15 cm Proximal to Olecranon Process  42 cm    10 cm Proximal to Olecranon Process  43.3 cm    Olecranon Process  35 cm    15 cm Proximal to Ulnar Styloid Process  31.2 cm    10 cm Proximal to Ulnar Styloid Process  25.7 cm    Just Proximal to Ulnar Styloid Process  17.8 cm    Across Hand at [employer]  19.2 cm    At [place] of 2nd Digit  6.1 cm    Other  26ml difference    Other  no pitting             Outpatient Rehab [facility]21/[facility]  Lymphedema Life Impact Scale Total Score  7.35 %      Objective measurements completed on examination: See above findings.              PT Education - 10/21/19 1000    Education Details  post op stretches, remedial UE movement to decrease edema, POC, lymphedema and lymphatic system, risk reduction    Person(s) Educated   Patient    Methods  Explanation;Demonstration;Tactile cues;Verbal cues;Handout    Comprehension  Verbalized understanding;Returned demonstration;Verbal cues required          PT Long Term Goals - 10/21/19 1050      PT LONG TERM GOAL #1   Title  Pt will increase Rt shoulder AROM to at least: Flex: 150, Abd: 160, horizontal abduction: 40 to improve mobility    Baseline  Flex: 107, Abd: 129, Horizontal abduction: 10    Time  4    Period  Weeks    Status  New      PT LONG TERM GOAL #2   Title  Pt will be ind with self MLD and use of compression for Rt UE swelling    Time  4    Period  Weeks    Status  New      PT LONG TERM GOAL #3   Title  Pt will be ind with final HEP for self care and UE mobility    Time  4    Period  Weeks    Status  New             Plan - 10/21/19 1044    Clinical Impression Statement  Pt presents with new onset of Rt UE swelling after  mutliple breast and lymph nodes surgeries to get clear margins.  Pt demonstrates an increase in the size of the Rt UE with a volumetric estimate of 18ml with most difference at the hand and fingers.  Pt also has a decrease in Rt shoulder AROM and pulling into the axilla with overhead movements.  Pt was educated on risk reduction, lymphedema, and POC with pt agreeable to 2x per week for 4 weeks making changes with chemo symptoms as needed with pt just starting chemo last week.  Pt has been scheduled for compression sleeve and glove measurement on 10/28/19.    Personal Factors and Comorbidities  Comorbidity 2    Comorbidities  lymph node removal, chemotherapy,    Examination-Activity Limitations  Lift    Examination-Participation Restrictions  Yard Work    Stability/Clinical Decision Making  Evolving/Moderate complexity    Clinical Decision Making  Low    Rehab Potential  Excellent    PT Frequency  2x / week    PT Duration  4 weeks    PT Treatment/Interventions  ADLs/Self Care Home Management;DME Instruction;Therapeutic  exercise;Patient/family education;Manual techniques;Manual lymph drainage;Compression bandaging;Passive range of motion    PT Next Visit Plan  start MLD for the Rt UE/teaching self MLD for the Rt UE, STM/release as needed in the Rt axilla and upper quadrant, shoulder PROM/AAROM. Pt with active Chemo    PT Home Exercise Plan  post op-stretches    Recommended Other Services  sleeve and glove getting measured 10/28/19, sending info to [facility] and prescription to Dr. Harolyn Rutherford and Agree with Plan of Care  Patient       Patient will benefit from skilled therapeutic intervention in order to improve the following deficits and impairments:  Pain, Decreased scar mobility, Postural dysfunction, Decreased range of motion, Increased edema  Visit Diagnosis: Lymphedema  Aftercare following surgery for neoplasm  Stiffness of right shoulder, not elsewhere classified     Problem List Patient Active Problem List   Diagnosis Date Noted  . Port-A-Cath in place 10/20/2019  . S/P mastectomy, right 10/19/2019  . Acquired absence of breast 09/16/2019  . Breast cancer ([place]) 09/08/2019  . Genetic testing 06/10/2019  . Malignant neoplasm of upper-outer quadrant of right breast in female, estrogen receptor positive ([place]) 06/01/2019  . Family history of breast cancer 06/01/2019  . Family history of colon cancer 06/01/2019  . Family history of brain cancer 06/01/2019  . History of colon cancer   . Abnormal colonoscopy 10/30/2017  . Chronic glaucoma 08/11/2013  . HELICOBACTER PYLORI INFECTION 08/27/2007  . GERD 08/27/2007  . ESOPHAGITIS, REFLUX 05/17/2007  . HEMORRHOIDS, INTERNAL 09/22/2003    Stark Bray 10/21/2019, 10:53 AM  [place], [place], [address] Phone: [phone]   Fax:  [phone]  Name: Wendy [name] MRN: [id number] Date of Birth: Mar 14, 1956

## 2019-10-24 ENCOUNTER — Other Ambulatory Visit: Payer: No Typology Code available for payment source

## 2019-10-24 ENCOUNTER — Ambulatory Visit: Payer: No Typology Code available for payment source

## 2019-10-24 ENCOUNTER — Ambulatory Visit: Payer: No Typology Code available for payment source | Admitting: Hematology and Oncology

## 2019-10-27 ENCOUNTER — Telehealth: Payer: Self-pay | Admitting: Hematology and Oncology

## 2019-10-27 NOTE — Telephone Encounter (Signed)
Scheduled per los, patient has been called and voicemail was left. 

## 2019-10-31 ENCOUNTER — Ambulatory Visit: Payer: 59

## 2019-10-31 ENCOUNTER — Other Ambulatory Visit: Payer: Self-pay

## 2019-10-31 DIAGNOSIS — I890 Lymphedema, not elsewhere classified: Secondary | ICD-10-CM | POA: Diagnosis not present

## 2019-10-31 DIAGNOSIS — Z483 Aftercare following surgery for neoplasm: Secondary | ICD-10-CM

## 2019-10-31 DIAGNOSIS — M25611 Stiffness of right shoulder, not elsewhere classified: Secondary | ICD-10-CM

## 2019-10-31 NOTE — Therapy (Signed)
[place], [place], [address] Phone: [phone]   Fax:  [phone]  Physical Therapy Treatment  Patient Details  Name: Wendy [name] MRN: [id number] Date of Birth: 03/28/1956 Referring Provider (PT): Dr. [name]   Encounter Date: 10/31/2019  PT End of Session - 10/31/19 1407    Visit Number  2    Number of Visits  9    Date for PT Re-Evaluation  11/25/19    PT Start Time  [id number]    PT Stop Time  1500    PT Time Calculation (min)  55 min    Activity Tolerance  Patient tolerated treatment well    Behavior During Therapy  [facility] for tasks assessed/performed       Past Medical History:  Diagnosis Date  . Breast cancer ([place])   . Colon cancer ([place])   . DDD (degenerative disc disease), cervical   . DDD (degenerative disc disease), lumbar   . ESOPHAGITIS, REFLUX   . Glaucoma   . HELICOBACTER PYLORI INFECTION   . HEMORRHOIDS, INTERNAL   . History of colon cancer   . History of colon polyps 2019  . Hyperlipidemia   . Radius fracture 2006   Left, Distal  . Thoracic spondylosis   . Tinnitus, bilateral 2013    Past Surgical History:  Procedure Laterality Date  . BREAST LUMPECTOMY WITH RADIOACTIVE SEED AND SENTINEL LYMPH NODE BIOPSY Right 06/27/2019   Procedure: RIGHT BREAST LUMPECTOMY WITH RADIOACTIVE SEED AND SENTINEL LYMPH NODE BIOPSY AND RIGHT TARGETED NODE DISSECTION;  Surgeon: Jovita Kussmaul, MD;  Location: [place];  Service: General;  Laterality: Right;  . BREAST RECONSTRUCTION WITH PLACEMENT OF TISSUE EXPANDER AND FLEX HD (ACELLULAR HYDRATED DERMIS) Right 09/08/2019   Procedure: BREAST RECONSTRUCTION WITH PLACEMENT OF TISSUE EXPANDER AND FLEX HD (ACELLULAR HYDRATED DERMIS);  Surgeon: Wallace Going, DO;  Location: [place];  Service: Plastics;  Laterality: Right;  . CESAREAN SECTION  1989  . COLONOSCOPY  2019  . HYSTEROSCOPY WITH D & C N/A 09/17/2018   Procedure: DILATATION AND CURETTAGE Marlene Bast;  Surgeon: Brien Few, MD;  Location: [place];  Service: Gynecology;  Laterality: N/A;  . PORTACATH PLACEMENT Left 08/01/2019   Procedure: INSERTION PORT-A-CATH;  Surgeon: Jovita Kussmaul, MD;  Location: [place];  Service: General;  Laterality: Left;  . RE-EXCISION OF BREAST CANCER,SUPERIOR MARGINS Right 08/12/2019   Procedure: RE-EXCISION OF RIGHT BREAST,SUPERIOR MARGINS;  Surgeon: Jovita Kussmaul, MD;  Location: [facility];  Service: General;  Laterality: Right;  . RE-EXCISION OF BREAST LUMPECTOMY Right 08/01/2019   Procedure: RE-EXCISION OF RIGHT BREAST CANCER  MARGINS;  Surgeon: Jovita Kussmaul, MD;  Location: [facility];  Service: General;  Laterality: Right;  . SIMPLE MASTECTOMY WITH AXILLARY SENTINEL NODE BIOPSY Right 09/08/2019   Procedure: RIGHT BREAST MASTECTOMY;  Surgeon: Jovita Kussmaul, MD;  Location: [place];  Service: General;  Laterality: Right;  . TONSILLECTOMY     age 64 years old  . WRIST FRACTURE SURGERY  2006    There were no vitals filed for this visit.  Subjective Assessment - 10/31/19 1408    Subjective  Pt states that she has a twinge like a pulling sensation from her R chest wall into her R medial brachium that gets worse with reaching.    Pertinent History  Rt lumpectomy grade 3 DCIS on 06/27/19 with Dr. [name] and Re-excision  on 08/12/19, then mastectomy with immediate expander placement and 8 lymph nodes removed Dr. Marla Roe for Plastics.  Currently getting chemotherapy of Cytoxan and Taxotere and will be having radiation when Chemotherapy is done.  1/4 chemo cycles complete.    Limitations  Lifting    Patient Stated Goals  decrease swelling and improve swelling    Currently in Pain?  No/denies                  Outpatient [facility]  Lymphedema Life Impact  Scale Total Score  7.35 %           [facility] Adult PT Treatment/Exercise - 10/31/19 0001      Manual Therapy   Manual Therapy  Myofascial release;Manual Lymphatic Drainage (MLD)    Myofascial Release  Myofascial release to the anterior chest wall, into the R medial brachium from the R axilla and from the R lateral chest wall into the R axilla and then longitudinally into the R brachium; improve P/ROm following myofascial release.     Manual Lymphatic Drainage (MLD)  MLD for the LUE in supine with education and pt performing steps throughout: 5 diaphragmatic breathes, bil axillary and R inguinal nodes, R axillo-inguinal and inter-axillary anastomosis, R lateral brachium, medial to lateral brachium, lateral brachium, all surfaces of the forearm and dorsum of the hand. Re-worked all surfaces and then re-worked all surfaces following myofascial release             PT Education - 10/31/19 1817    Education Details  Pt was educated on self MLD with an emphasis on stretch, direction and pressure with VC, tactile cues with hand over hand and demonstration.    Person(s) Educated  Patient    Methods  Explanation;Demonstration;Tactile cues;Verbal cues;Handout    Comprehension  Verbalized understanding;Returned demonstration          PT Long Term Goals - 10/21/19 1050      PT LONG TERM GOAL #1   Title  Pt will increase Rt shoulder AROM to at least: Flex: 150, Abd: 160, horizontal abduction: 40 to improve mobility    Baseline  Flex: 107, Abd: 129, Horizontal abduction: 10    Time  4    Period  Weeks    Status  New      PT LONG TERM GOAL #2   Title  Pt will be ind with self MLD and use of compression for Rt UE swelling    Time  4    Period  Weeks    Status  New      PT LONG TERM GOAL #3   Title  Pt will be ind with final HEP for self care and UE mobility    Time  4    Period  Weeks    Status  New            Plan - 10/31/19 1406    Clinical Impression Statement  Pt was  educated on self MLD this session with good return demonstration. Tight fascia noted on the R axilla into the R breast and the R lateral trunk; minimal improvement by end of session with improved P/ROM into increased range with decrease reported pain. Pt will benefit from continued POC at this time.    Personal Factors and Comorbidities  Comorbidity 2    Comorbidities  lymph node removal, chemotherapy,    Examination-Participation Restrictions  Yard Work    Environmental manager  PT Frequency  2x / week    PT Duration  4 weeks    PT Treatment/Interventions  ADLs/Self Care Home Management;DME Instruction;Therapeutic exercise;Patient/family education;Manual techniques;Manual lymph drainage;Compression bandaging;Passive range of motion    PT Next Visit Plan  Continue MLD for the Rt UE and review self MLD, STM/release as needed in the Rt axilla and upper quadrant, shoulder PROM/AAROM. Pt with active Chemo    PT Home Exercise Plan  post op-stretches    Consulted and Agree with Plan of Care  Patient       Patient will benefit from skilled therapeutic intervention in order to improve the following deficits and impairments:  Pain, Decreased scar mobility, Postural dysfunction, Decreased range of motion, Increased edema  Visit Diagnosis: Lymphedema  Aftercare following surgery for neoplasm  Stiffness of right shoulder, not elsewhere classified     Problem List Patient Active Problem List   Diagnosis Date Noted  . Port-A-Cath in place 10/20/2019  . S/P mastectomy, right 10/19/2019  . Acquired absence of breast 09/16/2019  . Breast cancer ([place]) 09/08/2019  . Genetic testing 06/10/2019  . Malignant neoplasm of upper-outer quadrant of right breast in female, estrogen receptor positive ([place]) 06/01/2019  . Family history of breast cancer 06/01/2019  . Family history of colon cancer 06/01/2019  . Family history of brain cancer 06/01/2019  . History of colon cancer   . Abnormal  colonoscopy 10/30/2017  . Chronic glaucoma 08/11/2013  . HELICOBACTER PYLORI INFECTION 08/27/2007  . GERD 08/27/2007  . ESOPHAGITIS, REFLUX 05/17/2007  . HEMORRHOIDS, INTERNAL 09/22/2003    Ander Purpura, PT 10/31/2019, 6:24 PM  [place], [place], [address] Phone: [phone]   Fax:  [phone]  Name: NELLA TRAGESSER MRN: [id number] Date of Birth: 1955/08/03

## 2019-10-31 NOTE — Patient Instructions (Signed)
Deep Effective Breath   Standing, sitting, or laying down, place both hands on the belly. Take a deep breath IN, expanding the belly; then breath OUT, contracting the belly. Repeat __5__ times. Do __2-3__ sessions per day and before your self massage.  http://gt2.exer.us/866   Copyright  VHI. All rights reserved.  Axilla to Axilla - Sweep   On both sides make 5 circles in the armpit, then pump _5__ times from involved armpit across chest to uninvolved armpit, making a pathway. Do _1__ time per day.  Copyright  VHI. All rights reserved.  Axilla to Inguinal Nodes - Sweep   On involved side, make 5 circles at groin at panty line, then pump _5__ times from armpit along side of trunk to outer hip, making your other pathway. Do __1_ time per day.  Copyright  VHI. All rights reserved.  Arm Posterior: Elbow to Shoulder - Sweep   Pump _5__ times from back of elbow to top of shoulder. Then inner to outer upper arm _5_ times, then outer arm again _5_ times. Then back to the pathways _2-3_ times. Do _1__ time per day.  Copyright  VHI. All rights reserved.  ARM: [place] Wrist to Elbow - Sweep   Pump or stationary circles _5__ times from wrist to elbow making sure to do both sides of the forearm. Then retrace your steps to the outer arm, and the pathways _2-3_ times each. Do _1__ time per day.  Copyright  VHI. All rights reserved.  ARM: Dorsum of Hand to Shoulder - Sweep   Pump or stationary circles _5__ times on back of hand including knuckle spaces and individual fingers if needed working up towards the wrist, then retrace all your steps working back up the forearm, doing both sides; upper outer arm and back to your pathways _2-3_ times each. Then do 5 circles again at uninvolved armpit and involved groin where you started! Good job!! Do __1_ time per day.  Copyright  VHI. All rights reserved.

## 2019-10-31 NOTE — Progress Notes (Signed)
Pharmacist Chemotherapy Monitoring - Follow Up Assessment    I verify that I have reviewed each item in the below checklist:  . Regimen for the patient is scheduled for the appropriate day and plan matches scheduled date. Marland Kitchen Appropriate non-routine labs are ordered dependent on drug ordered. . If applicable, additional medications reviewed and ordered per protocol based on lifetime cumulative doses and/or treatment regimen.   Plan for follow-up and/or issues identified: No . I-vent associated with next due treatment: No . MD and/or nursing notified: No  Wendy [name] K 10/31/2019 11:11 AM

## 2019-11-01 ENCOUNTER — Ambulatory Visit (INDEPENDENT_AMBULATORY_CARE_PROVIDER_SITE_OTHER): Payer: 59 | Admitting: Plastic Surgery

## 2019-11-01 ENCOUNTER — Encounter: Payer: Self-pay | Admitting: Plastic Surgery

## 2019-11-01 VITALS — BP 134/83 | HR 89 | Temp 98.0°F | Ht 63.0 in | Wt 213.0 lb

## 2019-11-01 DIAGNOSIS — Z9011 Acquired absence of right breast and nipple: Secondary | ICD-10-CM

## 2019-11-01 NOTE — Progress Notes (Signed)
   Subjective:    Patient ID: Wendy [name], female    DOB: 06-16-1956, 64 y.o.   MRN: [id number]  The patient is a 64 year old white female here for follow-up on her right breast reconstruction.  She is still going through chemotherapy.  She has been losing her hair.  She has several more treatments left which are planned to and the second week of June.  They are wanting to start radiation after that.  She is doing well otherwise. She is tolerating the expansions very well.  Her incision is healing nicely.  There is no sign of seroma or infection.  The right side is still a little bit smaller than the left.  The left has ptosis of the breast     Review of Systems  Constitutional: Negative.   HENT: Negative.   Eyes: Negative.   Respiratory: Negative.   Cardiovascular: Negative.   Gastrointestinal: Negative.   Endocrine: Negative.   Genitourinary: Negative.   Musculoskeletal: Negative.   Neurological: Negative.        Objective:   Physical Exam Vitals and nursing note reviewed.  Constitutional:      Appearance: Normal appearance.  HENT:     Head: Normocephalic and atraumatic.  Cardiovascular:     Rate and Rhythm: Normal rate.     Pulses: Normal pulses.  Pulmonary:     Effort: Pulmonary effort is normal.  Neurological:     General: No focal deficit present.     Mental Status: She is alert and oriented to person, place, and time.  Psychiatric:        Mood and Affect: Mood normal.        Behavior: Behavior normal.       Assessment & Plan:     ICD-10-CM   1. S/P mastectomy, right  Z90.11   2. Acquired absence of right breast  Z90.11     We placed injectable saline in the Expander using a sterile technique: Left: 50 cc for a total of 560 / 650 cc The patient is planning to finish up her chemotherapy second week of June.  We will need to do her exchange shortly thereafter.  She would also like a left breast mastopexy reduction.  She will then start radiation.  Would  like to get this done before the radiation if at all possible.  Pictures were obtained of the patient and placed in the chart with the patient's or guardian's permission.

## 2019-11-02 ENCOUNTER — Other Ambulatory Visit: Payer: Self-pay

## 2019-11-02 ENCOUNTER — Ambulatory Visit: Payer: 59

## 2019-11-02 DIAGNOSIS — I890 Lymphedema, not elsewhere classified: Secondary | ICD-10-CM | POA: Diagnosis not present

## 2019-11-02 DIAGNOSIS — M25611 Stiffness of right shoulder, not elsewhere classified: Secondary | ICD-10-CM

## 2019-11-02 DIAGNOSIS — Z483 Aftercare following surgery for neoplasm: Secondary | ICD-10-CM

## 2019-11-02 NOTE — Therapy (Signed)
[place], [place], [address] Phone: [phone]   Fax:  [phone]  Physical Therapy Treatment  Patient Details  Name: Wendy [name] MRN: [id number] Date of Birth: [birth year] Referring Provider (PT): Dr. [name]   Encounter Date: 11/02/2019  PT End of Session - 11/02/19 0904    Visit Number  3    Number of Visits  9    Date for PT Re-Evaluation  11/25/19    PT Start Time  0801    PT Stop Time  0901    PT Time Calculation (min)  60 min    Activity Tolerance  Patient tolerated treatment well    Behavior During Therapy  [facility] for tasks assessed/performed       Past Medical History:  Diagnosis Date  . Breast cancer ([place])   . Colon cancer ([place])   . DDD (degenerative disc disease), cervical   . DDD (degenerative disc disease), lumbar   . ESOPHAGITIS, REFLUX   . Glaucoma   . HELICOBACTER PYLORI INFECTION   . HEMORRHOIDS, INTERNAL   . History of colon cancer   . History of colon polyps 2019  . Hyperlipidemia   . Radius fracture 2006   Left, Distal  . Thoracic spondylosis   . Tinnitus, bilateral 2013    Past Surgical History:  Procedure Laterality Date  . BREAST LUMPECTOMY WITH RADIOACTIVE SEED AND SENTINEL LYMPH NODE BIOPSY Right 06/27/2019   Procedure: RIGHT BREAST LUMPECTOMY WITH RADIOACTIVE SEED AND SENTINEL LYMPH NODE BIOPSY AND RIGHT TARGETED NODE DISSECTION;  Surgeon: Jovita Kussmaul, MD;  Location: [place];  Service: General;  Laterality: Right;  . BREAST RECONSTRUCTION WITH PLACEMENT OF TISSUE EXPANDER AND FLEX HD (ACELLULAR HYDRATED DERMIS) Right 09/08/2019   Procedure: BREAST RECONSTRUCTION WITH PLACEMENT OF TISSUE EXPANDER AND FLEX HD (ACELLULAR HYDRATED DERMIS);  Surgeon: Wallace Going, DO;  Location: [facility];  Service: Plastics;  Laterality: Right;  . CESAREAN SECTION  1989  . COLONOSCOPY  2019  . HYSTEROSCOPY WITH D & C N/A 09/17/2018    Procedure: DILATATION AND CURETTAGE Marlene Bast;  Surgeon: Brien Few, MD;  Location: [facility];  Service: Gynecology;  Laterality: N/A;  . PORTACATH PLACEMENT Left 08/01/2019   Procedure: INSERTION PORT-A-CATH;  Surgeon: Jovita Kussmaul, MD;  Location: [facility];  Service: General;  Laterality: Left;  . RE-EXCISION OF BREAST CANCER,SUPERIOR MARGINS Right 08/12/2019   Procedure: RE-EXCISION OF RIGHT BREAST,SUPERIOR MARGINS;  Surgeon: Jovita Kussmaul, MD;  Location: [place];  Service: General;  Laterality: Right;  . RE-EXCISION OF BREAST LUMPECTOMY Right 08/01/2019   Procedure: RE-EXCISION OF RIGHT BREAST CANCER  MARGINS;  Surgeon: Jovita Kussmaul, MD;  Location: [place] [facility];  Service: General;  Laterality: Right;  . SIMPLE MASTECTOMY WITH AXILLARY SENTINEL NODE BIOPSY Right 09/08/2019   Procedure: RIGHT BREAST MASTECTOMY;  Surgeon: Jovita Kussmaul, MD;  Location: [facility];  Service: General;  Laterality: Right;  . TONSILLECTOMY     age 64 years old  . WRIST FRACTURE SURGERY  2006    There were no vitals filed for this visit.  Subjective Assessment - 11/02/19 0803    Subjective  I had 70 cc's injected into my Rt expander yesterday so I have a little discomfort from that but not bad. It was my fourth fill. I was just a little sore after last PT visit, but not bad.    Pertinent  History  Rt lumpectomy grade 3 DCIS on 06/27/19 with Dr. [name] and Re-excision on 08/12/19, then mastectomy with immediate expander placement and 8 lymph nodes removed Dr. Marla Roe for Plastics.  Currently getting chemotherapy of Cytoxan and Taxotere and will be having radiation when Chemotherapy is done.  1/4 chemo cycles complete.    Patient Stated Goals  decrease swelling and improve swelling    Currently in Pain?  No/denies                  [facility]patient [facility]   Lymphedema Life Impact Scale Total Score  7.35 %           [facility] Adult PT Treatment/Exercise - 11/02/19 0001      Manual Therapy   Manual Therapy  Myofascial release;Manual Lymphatic Drainage (MLD);Passive ROM    Myofascial Release  Myofascial release to the anterior chest wall, into the R medial brachium from the R axilla and from the R lateral chest wall into the R axilla and then longitudinally into the R brachium being mindful to be gentle today as pt had a fill yesterday.    Manual Lymphatic Drainage (MLD)  MLD for the Rt UE in supine: Short neck, superficial and deep abdominals,, Lt axillary and Rt inguinal nodes, Rt axillo-inguinal and anterior inter-axillary anastomosis, Rt lateral brachium, medial to lateral brachium, lateral brachium, all surfaces of the forearm and dorsum of the hand. Re-worked all surfaces and then re-worked all surfaces following myofascial release    Passive ROM  In Supine to Rt shoulder into flexion, abduction and D2 to pts tolerance                  PT Long Term Goals - 10/21/19 1050      PT LONG TERM GOAL #1   Title  Pt will increase Rt shoulder AROM to at least: Flex: 150, Abd: 160, horizontal abduction: 40 to improve mobility    Baseline  Flex: 107, Abd: 129, Horizontal abduction: 10    Time  4    Period  Weeks    Status  New      PT LONG TERM GOAL #2   Title  Pt will be ind with self MLD and use of compression for Rt UE swelling    Time  4    Period  Weeks    Status  New      PT LONG TERM GOAL #3   Title  Pt will be ind with final HEP for self care and UE mobility    Time  4    Period  Weeks    Status  New            Plan - 11/02/19 0904    Clinical Impression Statement  Continued with manual lymph drainage and myofascal relase to Rt UE but gently today as pt just had a fill yesterday. Educated her that if she has other appointments within 24 hours aftter a fill to maybe R/S to in case she has increased discomfort from  fill. She verbalized understanding, though did seemed to have only mild discomfort from recent fill. Pt repors feeling good after session today and encouraged her to cont gentle stretching during day to prevent soreness from PT and fill.    Personal Factors and Comorbidities  Comorbidity 2    Comorbidities  lymph node removal, chemotherapy,    Examination-Activity Limitations  Lift    Examination-Participation Restrictions  Yard Work  Stability/Clinical Decision Making  Evolving/Moderate complexity    Rehab Potential  Excellent    PT Frequency  2x / week    PT Duration  4 weeks    PT Treatment/Interventions  ADLs/Self Care Home Management;DME Instruction;Therapeutic exercise;Patient/family education;Manual techniques;Manual lymph drainage;Compression bandaging;Passive range of motion    PT Next Visit Plan  Continue MLD for the Rt UE and review self MLD, STM/release as needed in the Rt axilla and upper quadrant, shoulder PROM/AAROM. Pt with active Chemo    Consulted and Agree with Plan of Care  Patient       Patient will benefit from skilled therapeutic intervention in order to improve the following deficits and impairments:  Pain, Decreased scar mobility, Postural dysfunction, Decreased range of motion, Increased edema  Visit Diagnosis: Lymphedema  Aftercare following surgery for neoplasm  Stiffness of right shoulder, not elsewhere classified     Problem List Patient Active Problem List   Diagnosis Date Noted  . Port-A-Cath in place 10/20/2019  . S/P mastectomy, right 10/19/2019  . Acquired absence of breast 09/16/2019  . Breast cancer ([place]) 09/08/2019  . Genetic testing 06/10/2019  . Malignant neoplasm of upper-outer quadrant of right breast in female, estrogen receptor positive ([place]) 06/01/2019  . Family history of breast cancer 06/01/2019  . Family history of colon cancer 06/01/2019  . Family history of brain cancer 06/01/2019  . History of colon cancer   . Abnormal  colonoscopy 10/30/2017  . Chronic glaucoma 08/11/2013  . HELICOBACTER PYLORI INFECTION 08/27/2007  . GERD 08/27/2007  . ESOPHAGITIS, REFLUX 05/17/2007  . HEMORRHOIDS, INTERNAL 09/22/2003    Otelia Limes, PTA 11/02/2019, 9:07 AM  [place], [place], [address] Phone: [phone]   Fax:  [phone]  Name: Wendy [name] MRN: [id number] Date of Birth: March 22, 1956

## 2019-11-03 NOTE — Progress Notes (Signed)
Patient Care Team: [name], MD as PCP - General (Family Medicine) Mauro Kaufmann, RN as Oncology Nurse Navigator Rockwell Germany, RN as Oncology Nurse Navigator  DIAGNOSIS:    ICD-10-CM   1. Malignant neoplasm of upper-outer quadrant of right breast in female, estrogen receptor positive ([place])  C50.411    Z17.0     SUMMARY OF ONCOLOGIC HISTORY: Oncology History  Malignant neoplasm of upper-outer quadrant of right breast in female, estrogen receptor positive ([place])  05/20/2019 Initial Diagnosis   Palpable right breast mass superficial, ultrasound 11 o'clock position 1.7 cm: Biopsy revealed grade 3 IDC with DCIS ER 100%, PR 30%, Ki-67 20%, HER-2 by IHC negative, axilla lymph node biopsy positive   06/01/2019 Cancer Staging   Staging form: Breast, AJCC 8th Edition - Clinical stage from 06/01/2019: Stage IIA (cT1c, cN1, cM0, G3, ER+, PR+, HER2-) - Signed by Nicholas Lose, MD on 06/01/2019   06/27/2019 Surgery   Right lumpectomy [name]): IDC, grade 3, at least 2.5cm, involving a complex sclerosing lesion, 1.1cm, with high grade DCIS, involved margins, 1/3 right axillary lymph nodes positive, 1.4cm.    07/11/2019 Cancer Staging   Staging form: Breast, AJCC 8th Edition - Pathologic stage from 07/11/2019: Stage IIA (pT2, pN1a, cM0, G3, ER+, PR+, HER2-) - Signed by Nicholas Lose, MD on 07/11/2019   07/19/2019 Oncotype testing   Mammaprint: high risk, 29% chance of recurrence in 10 years without systemic treatment.    09/08/2019 Surgery   Re-excision x2 (08/01/19 and 08/12/19) followed by right mastectomy [name]): residual carcinoma and DCIS, 1/5 lymph nodes positive, clear margins.    10/14/2019 -  Chemotherapy   The patient had palonosetron (ALOXI) injection 0.25 mg, 0.25 mg, Intravenous,  Once, 1 of 4 cycles Administration: 0.25 mg (10/14/2019) pegfilgrastim (NEULASTA ONPRO KIT) injection 6 mg, 6 mg, Subcutaneous, Once, 1 of 4 cycles Administration: 6 mg (10/14/2019) cyclophosphamide  (CYTOXAN) 1,260 mg in sodium chloride 0.9 % 250 mL chemo infusion, 600 mg/m2 = 1,260 mg, Intravenous,  Once, 1 of 4 cycles Administration: 1,260 mg (10/14/2019) DOCEtaxel (TAXOTERE) 160 mg in sodium chloride 0.9 % 250 mL chemo infusion, 75 mg/m2 = 160 mg, Intravenous,  Once, 1 of 4 cycles Administration: 160 mg (10/14/2019)  for chemotherapy treatment.      CHIEF COMPLIANT: Cycle 2 Taxotere and Cytoxan  INTERVAL HISTORY: Wendy [name] is a 64 y.o. with above-mentioned history of right breast cancer who underwent a lumpectomy followed by re-excision and then a mastectomy and is currently on adjuvant chemotherapy with Taxotere and Cytoxan.She presents to the clinic todayfor cycle 2.   She had lots of muscle cramps that lasted 2 to 3 days.  She did not have any nausea or vomiting.  The Claritin and the Tylenol did not really prevent the pain.  She did great for the last 2 weeks.  ALLERGIES:  is allergic to vicodin [hydrocodone-acetaminophen] and brimonidine.  MEDICATIONS:  Current Outpatient Medications  Medication Sig Dispense Refill  . dexamethasone (DECADRON) 4 MG tablet Take 1 tablet (4 mg total) by mouth daily. Takes 1 tab day before chemo and 1 tab day after chemo with food. 8 tablet 0  . diazepam (VALIUM) 2 MG tablet Take 1 tablet (2 mg total) by mouth every 12 (twelve) hours as needed for muscle spasms. 20 tablet 0  . dorzolamide-timolol (COSOPT) 22.3-6.8 MG/ML ophthalmic solution PLACE 1 DROP INTO BOTH EYES 2 (TWO) TIMES DAILY.    . fluconazole (DIFLUCAN) 100 MG tablet Take 1 tablet (100  mg total) by mouth daily. 14 tablet 0  . latanoprost (XALATAN) 0.005 % ophthalmic solution 1 drop at bedtime.    . lidocaine-prilocaine (EMLA) cream Apply to affected area once 30 g 3  . LORazepam (ATIVAN) 0.5 MG tablet Take 1 tablet (0.5 mg total) by mouth at bedtime as needed for sleep. 30 tablet 0  . ondansetron (ZOFRAN) 8 MG tablet Take 1 tablet (8 mg total) by mouth 2 (two) times daily as needed  for refractory nausea / vomiting. Start on day 3 after chemo. 30 tablet 1  . prochlorperazine (COMPAZINE) 10 MG tablet Take 1 tablet (10 mg total) by mouth every 6 (six) hours as needed (Nausea or vomiting). 30 tablet 1  . venlafaxine XR (EFFEXOR-XR) 37.5 MG 24 hr capsule TAKE 1 CAPSULE BY MOUTH EVERY DAY WITH BREAKFAST. MAY INCREASE TO 2 CAPS DAILY IF NEEDED 90 capsule 2   No current facility-administered medications for this visit.   Facility-Administered Medications Ordered in Other Visits  Medication Dose Route Frequency Provider Last Rate Last Admin  . heparin lock flush 100 unit/mL  500 Units Intravenous Once Nicholas Lose, MD      . sodium chloride flush (NS) 0.9 % injection 10 mL  10 mL Intravenous PRN Nicholas Lose, MD   10 mL at 10/14/19 1032    PHYSICAL EXAMINATION: ECOG PERFORMANCE STATUS: 1 - Symptomatic but completely ambulatory  Vitals:   11/04/19 1116  BP: (!) 144/81  Pulse: 93  Resp: 17  Temp: 98.9 F (37.2 C)  SpO2: 100%   Filed Weights   11/04/19 1116  Weight: 212 lb 3.2 oz (96.3 kg)    LABORATORY DATA:  I have reviewed the data as listed CMP Latest Ref Rng & Units 10/20/2019 10/14/2019 04/30/2018  Glucose 70 - 99 mg/dL 112(H) 174(H) 91  BUN 8 - 23 mg/dL 6(L) 11 9  Creatinine 0.44 - 1.00 mg/dL 0.71 0.74 0.75  Sodium 135 - 145 mmol/L 141 139 142  Potassium 3.5 - 5.1 mmol/L 3.9 3.7 3.7  Chloride 98 - 111 mmol/L 106 110 103  CO2 22 - 32 mmol/L '26 23 24  ' Calcium 8.9 - 10.3 mg/dL 9.2 9.2 9.2  Total Protein 6.5 - 8.1 g/dL 7.2 7.7 7.0  Total Bilirubin 0.3 - 1.2 mg/dL 0.9 0.3 0.6  Alkaline Phos 38 - 126 U/L 106 97 74  AST 15 - 41 U/L '26 20 22  ' ALT 0 - 44 U/L '26 13 12    ' Lab Results  Component Value Date   WBC 13.0 (H) 11/04/2019   HGB 13.6 11/04/2019   HCT 42.5 11/04/2019   MCV 87.6 11/04/2019   PLT 369 11/04/2019   NEUTROABS 9.0 (H) 11/04/2019    ASSESSMENT & PLAN:  Malignant neoplasm of upper-outer quadrant of right breast in female, estrogen  receptor positive ([place]) 06/27/2019:Right lumpectomy [name]): IDC, grade 3, at least 2.5cm, involving a complex sclerosing lesion, 1.1cm, with high grade DCIS, involved margins, 1/3 right axillary lymph nodes positive, 1.4cm.ER 100%, PR 30%, Ki-67 20%, HER-2 IHC negative T2 N1 stage IIA MammaPrint: High risk, 10-year risk of recurrence untreated: 29% 09/08/2019:Re-excision x2 (08/01/19 and 08/12/19) followed by right mastectomy [name]): residual carcinoma and DCIS, 1/5 lymph nodes positive, clear margins.  Treatment plan: 1.Adjuvant chemotherapy withTaxotere and Cytoxan every 3 weeks x4 2.Adjuvant radiation therapy 3.Follow-up adjuvant antiestrogen therapywith anastrozole 1 mg daily x7 years Genetic testing ------------------------------------------------------------------------------------------------------ Current treatment: Cycle 2 Taxotere and Cytoxan Chemo toxicities: 1.  Thrush: Resolved with Diflucan 2.  Fatigue: Started  on day 3 3.  Muscle cramps: I discussed with her about taking magnesium supplementation as well as drinking lots of water. 4.  ANC nadir was 0 therefore I did reduce the dosage of cycle 2  Denies any nausea or vomiting. Denies neuropathy.  Return to clinic in 3 weeks for cycle 3     No orders of the defined types were placed in this encounter.  The patient has a good understanding of the overall plan. she agrees with it. she will call with any problems that may develop before the next visit here.  Total time spent: 30 mins including face to face time and time spent for planning, charting and coordination of care  Nicholas Lose, MD 11/04/2019  I, Cloyde Reams Dorshimer, am acting as scribe for Dr. Nicholas Lose.  I have reviewed the above documentation for accuracy and completeness, and I agree with the above.

## 2019-11-04 ENCOUNTER — Other Ambulatory Visit: Payer: Self-pay

## 2019-11-04 ENCOUNTER — Inpatient Hospital Stay: Payer: No Typology Code available for payment source

## 2019-11-04 ENCOUNTER — Inpatient Hospital Stay (HOSPITAL_BASED_OUTPATIENT_CLINIC_OR_DEPARTMENT_OTHER): Payer: No Typology Code available for payment source | Admitting: Hematology and Oncology

## 2019-11-04 DIAGNOSIS — Z170 Estrogen receptor positive status [ER+]: Secondary | ICD-10-CM

## 2019-11-04 DIAGNOSIS — C50411 Malignant neoplasm of upper-outer quadrant of right female breast: Secondary | ICD-10-CM

## 2019-11-04 DIAGNOSIS — Z5111 Encounter for antineoplastic chemotherapy: Secondary | ICD-10-CM | POA: Diagnosis not present

## 2019-11-04 DIAGNOSIS — Z95828 Presence of other vascular implants and grafts: Secondary | ICD-10-CM

## 2019-11-04 LAB — CMP (CANCER CENTER ONLY)
ALT: 13 U/L (ref 0–44)
AST: 18 U/L (ref 15–41)
Albumin: 3.7 g/dL (ref 3.5–5.0)
Alkaline Phosphatase: 82 U/L (ref 38–126)
Anion gap: 10 (ref 5–15)
BUN: 10 mg/dL (ref 8–23)
CO2: 23 mmol/L (ref 22–32)
Calcium: 9.1 mg/dL (ref 8.9–10.3)
Chloride: 111 mmol/L (ref 98–111)
Creatinine: 0.78 mg/dL (ref 0.44–1.00)
GFR, Est AFR Am: >60 mL/min (ref >60)
GFR, Estimated: >60 mL/min (ref >60)
Glucose, Bld: 141 mg/dL — ABNORMAL HIGH (ref 70–99)
Potassium: 3.7 mmol/L (ref 3.5–5.1)
Sodium: 144 mmol/L (ref 135–145)
Total Bilirubin: 0.4 mg/dL (ref 0.3–1.2)
Total Protein: 7.1 g/dL (ref 6.5–8.1)

## 2019-11-04 LAB — CBC WITH DIFFERENTIAL (CANCER CENTER ONLY)
Abs Immature Granulocytes: 0.04 10*3/uL (ref 0.00–0.07)
Basophils Absolute: 0 10*3/uL (ref 0.0–0.1)
Basophils Relative: 0 %
Eosinophils Absolute: 0 10*3/uL (ref 0.0–0.5)
Eosinophils Relative: 0 %
HCT: 42.5 % (ref 36.0–46.0)
Hemoglobin: 13.6 g/dL (ref 12.0–15.0)
Immature Granulocytes: 0 %
Lymphocytes Relative: 19 %
Lymphs Abs: 2.4 10*3/uL (ref 0.7–4.0)
MCH: 28 pg (ref 26.0–34.0)
MCHC: 32 g/dL (ref 30.0–36.0)
MCV: 87.6 fL (ref 80.0–100.0)
Monocytes Absolute: 1.6 10*3/uL — ABNORMAL HIGH (ref 0.1–1.0)
Monocytes Relative: 12 %
Neutro Abs: 9 10*3/uL — ABNORMAL HIGH (ref 1.7–7.7)
Neutrophils Relative %: 69 %
Platelet Count: 369 10*3/uL (ref 150–400)
RBC: 4.85 MIL/uL (ref 3.87–5.11)
RDW: 14.6 % (ref 11.5–15.5)
WBC Count: 13 10*3/uL — ABNORMAL HIGH (ref 4.0–10.5)
nRBC: 0 % (ref 0.0–0.2)

## 2019-11-04 MED ORDER — PEGFILGRASTIM 6 MG/0.6ML ~~LOC~~ PSKT
PREFILLED_SYRINGE | SUBCUTANEOUS | Status: AC
  Filled 2019-11-04: qty 0.6

## 2019-11-04 MED ORDER — SODIUM CHLORIDE 0.9 % IV SOLN
65.0000 mg/m2 | Freq: Once | INTRAVENOUS | Status: AC
  Administered 2019-11-04: 140 mg via INTRAVENOUS
  Filled 2019-11-04: qty 14

## 2019-11-04 MED ORDER — SODIUM CHLORIDE 0.9 % IV SOLN
10.0000 mg | Freq: Once | INTRAVENOUS | Status: AC
  Administered 2019-11-04: 10 mg via INTRAVENOUS
  Filled 2019-11-04: qty 10

## 2019-11-04 MED ORDER — SODIUM CHLORIDE 0.9% FLUSH
10.0000 mL | INTRAVENOUS | Status: DC | PRN
  Administered 2019-11-04: 10 mL
  Filled 2019-11-04: qty 10

## 2019-11-04 MED ORDER — PALONOSETRON HCL INJECTION 0.25 MG/5ML
0.2500 mg | Freq: Once | INTRAVENOUS | Status: AC
  Administered 2019-11-04: 0.25 mg via INTRAVENOUS

## 2019-11-04 MED ORDER — PALONOSETRON HCL INJECTION 0.25 MG/5ML
INTRAVENOUS | Status: AC
  Filled 2019-11-04: qty 5

## 2019-11-04 MED ORDER — SODIUM CHLORIDE 0.9 % IV SOLN
Freq: Once | INTRAVENOUS | Status: AC
  Filled 2019-11-04: qty 250

## 2019-11-04 MED ORDER — SODIUM CHLORIDE 0.9 % IV SOLN
500.0000 mg/m2 | Freq: Once | INTRAVENOUS | Status: AC
  Administered 2019-11-04: 1040 mg via INTRAVENOUS
  Filled 2019-11-04: qty 52

## 2019-11-04 MED ORDER — HEPARIN SOD (PORK) LOCK FLUSH 100 UNIT/ML IV SOLN
500.0000 [IU] | Freq: Once | INTRAVENOUS | Status: AC | PRN
  Administered 2019-11-04: 500 [IU]
  Filled 2019-11-04: qty 5

## 2019-11-04 MED ORDER — PEGFILGRASTIM 6 MG/0.6ML ~~LOC~~ PSKT
6.0000 mg | PREFILLED_SYRINGE | Freq: Once | SUBCUTANEOUS | Status: AC
  Administered 2019-11-04: 6 mg via SUBCUTANEOUS

## 2019-11-04 NOTE — Assessment & Plan Note (Signed)
06/27/2019:Right lumpectomy Wendy [name]): IDC, grade 3, at least 2.5cm, involving a complex sclerosing lesion, 1.1cm, with high grade DCIS, involved margins, 1/3 right axillary lymph nodes positive, 1.4cm.ER 100%, PR 30%, Ki-67 20%, HER-2 IHC negative T2 N1 stage IIA MammaPrint: High risk, 10-year risk of recurrence untreated: 29% 09/08/2019:Re-excision x2 (08/01/19 and 08/12/19) followed by right mastectomy Wendy [name]): residual carcinoma and DCIS, 1/5 lymph nodes positive, clear margins.  Treatment plan: 1.Adjuvant chemotherapy withTaxotere and Cytoxan every 3 weeks x4 2.Adjuvant radiation therapy 3.Follow-up adjuvant antiestrogen therapywith anastrozole 1 mg daily x7 years Genetic testing ------------------------------------------------------------------------------------------------------ Current treatment: Cycle 2 Taxotere and Cytoxan Chemo toxicities: 1.  Thrush: Resolved with Diflucan 2.  Fatigue: Started on day 3 3.  Muscle cramps  Denies any nausea or vomiting. Denies neuropathy.  Return to clinic in 3 weeks for cycle 3

## 2019-11-04 NOTE — Patient Instructions (Addendum)
[facility] Discharge Instructions for Patients Receiving Chemotherapy  Today you received the following chemotherapy agents: Docetaxel (Taxotere), Cyclophosphamide (Cytoxan)  To help prevent nausea and vomiting after your treatment, we encourage you to take your nausea medication as directed by your MD.   If you develop nausea and vomiting that is not controlled by your nausea medication, call the clinic.   BELOW ARE SYMPTOMS THAT SHOULD BE REPORTED IMMEDIATELY:  *FEVER GREATER THAN 100.5 F  *CHILLS WITH OR WITHOUT FEVER  NAUSEA AND VOMITING THAT IS NOT CONTROLLED WITH YOUR NAUSEA MEDICATION  *UNUSUAL SHORTNESS OF BREATH  *UNUSUAL BRUISING OR BLEEDING  TENDERNESS IN MOUTH AND THROAT WITH OR WITHOUT PRESENCE OF ULCERS  *URINARY PROBLEMS  *BOWEL PROBLEMS  UNUSUAL RASH Items with * indicate a potential emergency and should be followed up as soon as possible.  Feel free to call the clinic should you have any questions or concerns. The clinic phone number is [phone].  Please show the [facility] at check-in to the Emergency Department and triage nurse.   Pegfilgrastim injection What is this medicine? PEGFILGRASTIM (PEG fil gra stim) is a long-acting granulocyte colony-stimulating factor that stimulates the growth of neutrophils, a type of white blood cell important in the body's fight against infection. It is used to reduce the incidence of fever and infection in patients with certain types of cancer who are receiving chemotherapy that affects the bone marrow, and to increase survival after being exposed to high doses of radiation. This medicine may be used for other purposes; ask your health care provider or pharmacist if you have questions. COMMON BRAND NAME(S): Steve Rattler, Ziextenzo What should I tell my health care provider before I take this medicine? They need to know if you have any of these conditions:  kidney disease  latex  allergy  ongoing radiation therapy  sickle cell disease  skin reactions to acrylic adhesives (On-Body Injector only)  an unusual or allergic reaction to pegfilgrastim, filgrastim, other medicines, foods, dyes, or preservatives  pregnant or trying to get pregnant  breast-feeding How should I use this medicine? This medicine is for injection under the skin. If you get this medicine at home, you will be taught how to prepare and give the pre-filled syringe or how to use the On-body Injector. Refer to the patient Instructions for Use for detailed instructions. Use exactly as directed. Tell your healthcare provider immediately if you suspect that the On-body Injector may not have performed as intended or if you suspect the use of the On-body Injector resulted in a missed or partial dose. It is important that you put your used needles and syringes in a special sharps container. Do not put them in a trash can. If you do not have a sharps container, call your pharmacist or healthcare provider to get one. Talk to your pediatrician regarding the use of this medicine in children. While this drug may be prescribed for selected conditions, precautions do apply. Overdosage: If you think you have taken too much of this medicine contact a poison control center or emergency room at once. NOTE: This medicine is only for you. Do not share this medicine with others. What if I miss a dose? It is important not to miss your dose. Call your doctor or health care professional if you miss your dose. If you miss a dose due to an On-body Injector failure or leakage, a new dose should be administered as soon as possible using a single prefilled  syringe for manual use. What may interact with this medicine? Interactions have not been studied. Give your health care provider a list of all the medicines, herbs, non-prescription drugs, or dietary supplements you use. Also tell them if you smoke, drink alcohol, or use illegal  drugs. Some items may interact with your medicine. This list may not describe all possible interactions. Give your health care provider a list of all the medicines, herbs, non-prescription drugs, or dietary supplements you use. Also tell them if you smoke, drink alcohol, or use illegal drugs. Some items may interact with your medicine. What should I watch for while using this medicine? You may need blood work done while you are taking this medicine. If you are going to need a MRI, CT scan, or other procedure, tell your doctor that you are using this medicine (On-Body Injector only). What side effects may I notice from receiving this medicine? Side effects that you should report to your doctor or health care professional as soon as possible:  allergic reactions like skin rash, itching or hives, swelling of the face, lips, or tongue  back pain  dizziness  fever  pain, redness, or irritation at site where injected  pinpoint red spots on the skin  red or dark-brown urine  shortness of breath or breathing problems  stomach or side pain, or pain at the shoulder  swelling  tiredness  trouble passing urine or change in the amount of urine Side effects that usually do not require medical attention (report to your doctor or health care professional if they continue or are bothersome):  bone pain  muscle pain This list may not describe all possible side effects. Call your doctor for medical advice about side effects. You may report side effects to FDA at 1-800-FDA-1088. Where should I keep my medicine? Keep out of the reach of children. If you are using this medicine at home, you will be instructed on how to store it. Throw away any unused medicine after the expiration date on the label. NOTE: This sheet is a summary. It may not cover all possible information. If you have questions about this medicine, talk to your doctor, pharmacist, or health care provider.  2020 Elsevier/Gold  Standard (2017-09-28 16:57:08)  Coronavirus (COVID-19) Are you at risk?  Are you at risk for the Coronavirus (COVID-19)?  To be considered HIGH RISK for Coronavirus (COVID-19), you have to meet the following criteria:  . Traveled to [place], [place], [place], [place] or [place]; or in the [place] to [place], [place], [place], or [place]; and have fever, cough, and shortness of breath within the last 2 weeks of travel OR . Been in close contact with a person diagnosed with COVID-19 within the last 2 weeks and have fever, cough, and shortness of breath . IF YOU DO NOT MEET THESE CRITERIA, YOU ARE CONSIDERED LOW RISK FOR COVID-19.  What to do if you are HIGH RISK for COVID-19?  Marland Kitchen If you are having a medical emergency, call 911. . Seek medical care right away. Before you go to a doctor's office, urgent care or emergency department, call ahead and tell them about your recent travel, contact with someone diagnosed with COVID-19, and your symptoms. You should receive instructions from your physician's office regarding next steps of care.  . When you arrive at healthcare provider, tell the healthcare staff immediately you have returned from visiting [place], [place], [place], [place] or [place]; or traveled in the [place] to [place], [place], or [place]; in the last two weeks or you have been in close contact with a person diagnosed with COVID-19 in the last 2 weeks.   . Tell the health care staff about your symptoms: fever, cough and shortness of breath. . After you have been seen by a medical provider, you will be either: o Tested for (COVID-19) and discharged home on quarantine except to seek medical care if symptoms worsen, and asked to  - Stay home and avoid contact with others until you get your results (4-5 days)  - Avoid travel on public transportation if possible (such as bus, train, or airplane) or o Sent to the Emergency Department by EMS for evaluation,  COVID-19 testing, and possible admission depending on your condition and test results.  What to do if you are LOW RISK for COVID-19?  Reduce your risk of any infection by using the same precautions used for avoiding the common cold or flu:  Marland Kitchen Wash your hands often with soap and warm water for at least 20 seconds.  If soap and water are not readily available, use an alcohol-based hand sanitizer with at least 60% alcohol.  . If coughing or sneezing, cover your mouth and nose by coughing or sneezing into the elbow areas of your shirt or coat, into a tissue or into your sleeve (not your hands). . Avoid shaking hands with others and consider head nods or verbal greetings only. . Avoid touching your eyes, nose, or mouth with unwashed hands.  . Avoid close contact with people who are sick. . Avoid places or events with large numbers of people in one location, like concerts or sporting events. . Carefully consider travel plans you have or are making. . If you are planning any travel outside or inside the [place], visit the CDC's Travelers' Health webpage for the latest health notices. . If you have some symptoms but not all symptoms, continue to monitor at home and seek medical attention if your symptoms worsen. . If you are having a medical emergency, call 911.   [place] / e-Visit: [email or web address]         [facility]: [facility]: [id number]                   [facility]: [phone]

## 2019-11-07 ENCOUNTER — Telehealth: Payer: Self-pay | Admitting: Hematology and Oncology

## 2019-11-07 NOTE — Telephone Encounter (Signed)
No 4/30 los. No changes made to pt's schedule.  

## 2019-11-10 ENCOUNTER — Encounter: Payer: Self-pay | Admitting: *Deleted

## 2019-11-11 ENCOUNTER — Other Ambulatory Visit: Payer: Self-pay | Admitting: Hematology and Oncology

## 2019-11-11 ENCOUNTER — Ambulatory Visit: Payer: No Typology Code available for payment source

## 2019-11-11 ENCOUNTER — Telehealth: Payer: Self-pay

## 2019-11-11 MED ORDER — OXYCODONE-ACETAMINOPHEN 5-325 MG PO TABS: 1.0000 | ORAL_TABLET | Freq: Three times a day (TID) | ORAL | 0 refills | Status: DC | PRN

## 2019-11-11 NOTE — Telephone Encounter (Signed)
RN spoke with patient, pt reporting significant pain to back, hips, and joints.    Pt is s/p cycle 2 of Taxotere and Cytoxan with G-CSF support.    Pt using Tylenol, Motrin, and Claritin for preventive measures to decrease pain.  Pt reports pain severe, waking up throughout the night.    MD recommendations to start Percocet 1-2 daily as needed for severe pain.  MD sent to pharmacy.  Pt aware, verbalized understanding.

## 2019-11-13 NOTE — Progress Notes (Signed)
Patient is a 64 year old female here for follow-up after undergoing a right mastectomy with Dr. [name] and reconstruction with placement of tissue expander with Dr. Marla Roe on 09/08/2019.  She started chemotherapy on 10/14/2019 with plan for total cycles every 3 weeks; will begin radiation after that.  She began lymphedema therapy on 4/16.  Reports she is doing well overall.  Right breast incision is healing very well, c/d/i. No signs of infection, drainage, redness, seroma/hematoma. Denies F, and N/V.   We placed injectable saline in the Expander using a sterile technique: Right: 70 cc for a total of 630 / 650 cc  Plan is to have a left mastopexy or reduction at the time of exchange of right tissue expander for implant. Surgery scheduled for June 28.  Follow up in 2 weeks for possible additional fill. Call office with any questions/concerns.  The [place] was signed into law in 2016 which includes the topic of electronic health records.  This provides immediate access to information in MyChart.  This includes consultation notes, operative notes, office notes, lab results and pathology reports.  If you have any questions about what you read please let us know at your next visit or call us at the office.  We are right here with you.

## 2019-11-16 ENCOUNTER — Ambulatory Visit (INDEPENDENT_AMBULATORY_CARE_PROVIDER_SITE_OTHER): Payer: 59 | Admitting: Plastic Surgery

## 2019-11-16 ENCOUNTER — Encounter: Payer: Self-pay | Admitting: Plastic Surgery

## 2019-11-16 ENCOUNTER — Other Ambulatory Visit: Payer: Self-pay

## 2019-11-16 ENCOUNTER — Ambulatory Visit: Payer: No Typology Code available for payment source | Attending: General Surgery

## 2019-11-16 VITALS — BP 122/71 | HR 99 | Temp 97.3°F | Ht 63.0 in | Wt 212.0 lb

## 2019-11-16 DIAGNOSIS — M25611 Stiffness of right shoulder, not elsewhere classified: Secondary | ICD-10-CM | POA: Insufficient documentation

## 2019-11-16 DIAGNOSIS — Z170 Estrogen receptor positive status [ER+]: Secondary | ICD-10-CM

## 2019-11-16 DIAGNOSIS — C50411 Malignant neoplasm of upper-outer quadrant of right female breast: Secondary | ICD-10-CM

## 2019-11-16 DIAGNOSIS — Z483 Aftercare following surgery for neoplasm: Secondary | ICD-10-CM | POA: Insufficient documentation

## 2019-11-16 DIAGNOSIS — I890 Lymphedema, not elsewhere classified: Secondary | ICD-10-CM | POA: Diagnosis present

## 2019-11-16 DIAGNOSIS — Z9011 Acquired absence of right breast and nipple: Secondary | ICD-10-CM

## 2019-11-16 NOTE — Therapy (Signed)
[place], [place], [address] Phone: [phone]   Fax:  [phone]  Physical Therapy Treatment  Patient Details  Name: Wendy [name] MRN: [id number] Date of Birth: 04/06/1956 Referring Provider (PT): Dr. [name]   Encounter Date: 11/16/2019  PT End of Session - 11/16/19 1105    Visit Number  4    Number of Visits  9    Date for PT Re-Evaluation  11/25/19    PT Start Time  1009    PT Stop Time  1107    PT Time Calculation (min)  58 min    Activity Tolerance  Patient tolerated treatment well    Behavior During Therapy  [facility] for tasks assessed/performed       Past Medical History:  Diagnosis Date  . Breast cancer ([place])   . Colon cancer ([place])   . DDD (degenerative disc disease), cervical   . DDD (degenerative disc disease), lumbar   . ESOPHAGITIS, REFLUX   . Glaucoma   . HELICOBACTER PYLORI INFECTION   . HEMORRHOIDS, INTERNAL   . History of colon cancer   . History of colon polyps 2019  . Hyperlipidemia   . Radius fracture 2006   Left, Distal  . Thoracic spondylosis   . Tinnitus, bilateral 2013    Past Surgical History:  Procedure Laterality Date  . BREAST LUMPECTOMY WITH RADIOACTIVE SEED AND SENTINEL LYMPH NODE BIOPSY Right 06/27/2019   Procedure: RIGHT BREAST LUMPECTOMY WITH RADIOACTIVE SEED AND SENTINEL LYMPH NODE BIOPSY AND RIGHT TARGETED NODE DISSECTION;  Surgeon: Jovita Kussmaul, MD;  Location: [facility];  Service: General;  Laterality: Right;  . BREAST RECONSTRUCTION WITH PLACEMENT OF TISSUE EXPANDER AND FLEX HD (ACELLULAR HYDRATED DERMIS) Right 09/08/2019   Procedure: BREAST RECONSTRUCTION WITH PLACEMENT OF TISSUE EXPANDER AND FLEX HD (ACELLULAR HYDRATED DERMIS);  Surgeon: Wallace Going, DO;  Location: [facility];  Service: Plastics;  Laterality: Right;  . CESAREAN SECTION  1989  . COLONOSCOPY  2019  . HYSTEROSCOPY WITH D & C N/A 09/17/2018    Procedure: DILATATION AND CURETTAGE Marlene Bast;  Surgeon: Brien Few, MD;  Location: [facility];  Service: Gynecology;  Laterality: N/A;  . PORTACATH PLACEMENT Left 08/01/2019   Procedure: INSERTION PORT-A-CATH;  Surgeon: Jovita Kussmaul, MD;  Location: [facility];  Service: General;  Laterality: Left;  . RE-EXCISION OF BREAST CANCER,SUPERIOR MARGINS Right 08/12/2019   Procedure: RE-EXCISION OF RIGHT BREAST,SUPERIOR MARGINS;  Surgeon: Jovita Kussmaul, MD;  Location: [facility];  Service: General;  Laterality: Right;  . RE-EXCISION OF BREAST LUMPECTOMY Right 08/01/2019   Procedure: RE-EXCISION OF RIGHT BREAST CANCER  MARGINS;  Surgeon: Jovita Kussmaul, MD;  Location: [facility];  Service: General;  Laterality: Right;  . SIMPLE MASTECTOMY WITH AXILLARY SENTINEL NODE BIOPSY Right 09/08/2019   Procedure: RIGHT BREAST MASTECTOMY;  Surgeon: Jovita Kussmaul, MD;  Location: [facility];  Service: General;  Laterality: Right;  . TONSILLECTOMY     age 64 years old  . WRIST FRACTURE SURGERY  2006    There were no vitals filed for this visit.  Subjective Assessment - 11/16/19 1012    Subjective  I have another injection scheduled for today and maybe 2 more times after before surgery but we'll talk about that today. I can tell my arm is getting smaller and the exercises are working.    Pertinent History  Rt lumpectomy grade  3 DCIS on 06/27/19 with Dr. [name] and Re-excision on 08/12/19, then mastectomy with immediate expander placement and 8 lymph nodes removed Dr. Marla Roe for Plastics.  Currently getting chemotherapy of Cytoxan and Taxotere and will be having radiation when Chemotherapy is done.  1/4 chemo cycles complete.    Patient Stated Goals  decrease swelling and improve swelling    Currently in Pain?  No/denies   just discomfort at expander in Rt breast        [facility] PT Assessment - 11/16/19 0001      AROM   Right  Shoulder Flexion  133 Degrees    Right Shoulder ABduction  142 Degrees    Right Shoulder Internal Rotation  38 Degrees        LYMPHEDEMA/ONCOLOGY QUESTIONNAIRE - 11/16/19 1100      Right Upper Extremity Lymphedema   15 cm Proximal to Olecranon Process  40.2 cm    10 cm Proximal to Olecranon Process  42.2 cm    Olecranon Process  31.8 cm    15 cm Proximal to Ulnar Styloid Process  30.3 cm    10 cm Proximal to Ulnar Styloid Process  26.1 cm    Just Proximal to Ulnar Styloid Process  17.8 cm    Across Hand at [employer]  18.8 cm    At [place] of 2nd Digit  6.5 cm           Outpatient Re[facility]  Lymphedema Life Impact Scale Total Score  7.35 %           [facility] Adult PT Treatment/Exercise - 11/16/19 0001      Manual Therapy   Myofascial Release  Myofascial release to the anterior chest wall, into the R medial brachium from the R axilla and from the R lateral chest wall into the R axilla and then longitudinally into the R brachium being mindful to be gentle today as pt had a fill yesterday.    Manual Lymphatic Drainage (MLD)  MLD for the Rt UE in supine: Short neck, superficial and deep abdominals,, Lt axillary and Rt inguinal nodes, Rt axillo-inguinal and anterior inter-axillary anastomosis, Rt lateral brachium, medial to lateral brachium, lateral brachium, all surfaces of the forearm and dorsum of the hand. Re-worked all surfaces and then re-worked all surfaces following myofascial release    Passive ROM  In Supine to Rt shoulder into flexion, abduction and D2 to pts tolerance                  PT Long Term Goals - 10/21/19 1050      PT LONG TERM GOAL #1   Title  Pt will increase Rt shoulder AROM to at least: Flex: 150, Abd: 160, horizontal abduction: 40 to improve mobility    Baseline  Flex: 107, Abd: 129, Horizontal abduction: 10    Time  4    Period  Weeks    Status  New      PT LONG TERM GOAL #2    Title  Pt will be ind with self MLD and use of compression for Rt UE swelling    Time  4    Period  Weeks    Status  New      PT LONG TERM GOAL #3   Title  Pt will be ind with final HEP for self care and UE mobility    Time  4    Period  Weeks  Status  New            Plan - 11/16/19 1107    Clinical Impression Statement  Pt is progressing well towards goals thus far. Her A/ROM has improved as well as her circumference is reducing. She reports being independent with current HEP and will be ready to progres further next session.    Personal Factors and Comorbidities  Comorbidity 2    Comorbidities  lymph node removal, chemotherapy,    Examination-Activity Limitations  Lift    Examination-Participation Restrictions  Yard Work    Stability/Clinical Decision Making  Evolving/Moderate complexity    Rehab Potential  Excellent    PT Frequency  2x / week    PT Duration  4 weeks    PT Treatment/Interventions  ADLs/Self Care Home Management;DME Instruction;Therapeutic exercise;Patient/family education;Manual techniques;Manual lymph drainage;Compression bandaging;Passive range of motion    PT Next Visit Plan  Add supine scapular series with yellow theraband to HEP; Continue MLD for the Rt UE and review self MLD, STM/release as needed in the Rt axilla and upper quadrant, shoulder PROM/AAROM. Pt with active Chemo    PT Home Exercise Plan  post op-stretches    Consulted and Agree with Plan of Care  Patient       Patient will benefit from skilled therapeutic intervention in order to improve the following deficits and impairments:  Pain, Decreased scar mobility, Postural dysfunction, Decreased range of motion, Increased edema  Visit Diagnosis: Lymphedema  Aftercare following surgery for neoplasm  Stiffness of right shoulder, not elsewhere classified     Problem List Patient Active Problem List   Diagnosis Date Noted  . Port-A-Cath in place 10/20/2019  . S/P mastectomy, right  10/19/2019  . Acquired absence of breast 09/16/2019  . Breast cancer ([place]) 09/08/2019  . Genetic testing 06/10/2019  . Malignant neoplasm of upper-outer quadrant of right breast in female, estrogen receptor positive ([place]) 06/01/2019  . Family history of breast cancer 06/01/2019  . Family history of colon cancer 06/01/2019  . Family history of brain cancer 06/01/2019  . History of colon cancer   . Abnormal colonoscopy 10/30/2017  . Chronic glaucoma 08/11/2013  . HELICOBACTER PYLORI INFECTION 08/27/2007  . GERD 08/27/2007  . ESOPHAGITIS, REFLUX 05/17/2007  . HEMORRHOIDS, INTERNAL 09/22/2003    Otelia Limes, PTA 11/16/2019, 11:09 AM  [place], [place], [address] Phone: [phone]   Fax:  [phone]  Name: Wendy [name] MRN: [id number] Date of Birth: 14-Aug-1955

## 2019-11-18 NOTE — Progress Notes (Signed)
Pharmacist Chemotherapy Monitoring - Follow Up Assessment    I verify that I have reviewed each item in the below checklist:  . Regimen for the patient is scheduled for the appropriate day and plan matches scheduled date. . Appropriate non-routine labs are ordered dependent on drug ordered. . If applicable, additional medications reviewed and ordered per protocol based on lifetime cumulative doses and/or treatment regimen.   Plan for follow-up and/or issues identified: No . I-vent associated with next due treatment: No . MD and/or nursing notified: No  Wendy [name] 11/18/2019 9:29 AM  

## 2019-11-21 ENCOUNTER — Ambulatory Visit: Payer: No Typology Code available for payment source

## 2019-11-23 NOTE — Progress Notes (Signed)
Patient Care Team: [name], MD as PCP - General (Family Medicine) Mauro Kaufmann, RN as Oncology Nurse Navigator Rockwell Germany, RN as Oncology Nurse Navigator  DIAGNOSIS:    ICD-10-CM   1. Malignant neoplasm of upper-outer quadrant of right breast in female, estrogen receptor positive ([place])  C50.411    Z17.0     SUMMARY OF ONCOLOGIC HISTORY: Oncology History  Malignant neoplasm of upper-outer quadrant of right breast in female, estrogen receptor positive ([place])  05/20/2019 Initial Diagnosis   Palpable right breast mass superficial, ultrasound 11 o'clock position 1.7 cm: Biopsy revealed grade 3 IDC with DCIS ER 100%, PR 30%, Ki-67 20%, HER-2 by IHC negative, axilla lymph node biopsy positive   06/01/2019 Cancer Staging   Staging form: Breast, AJCC 8th Edition - Clinical stage from 06/01/2019: Stage IIA (cT1c, cN1, cM0, G3, ER+, PR+, HER2-) - Signed by Nicholas Lose, MD on 06/01/2019   06/27/2019 Surgery   Right lumpectomy [name]): IDC, grade 3, at least 2.5cm, involving a complex sclerosing lesion, 1.1cm, with high grade DCIS, involved margins, 1/3 right axillary lymph nodes positive, 1.4cm.    07/11/2019 Cancer Staging   Staging form: Breast, AJCC 8th Edition - Pathologic stage from 07/11/2019: Stage IIA (pT2, pN1a, cM0, G3, ER+, PR+, HER2-) - Signed by Nicholas Lose, MD on 07/11/2019   07/19/2019 Oncotype testing   Mammaprint: high risk, 29% chance of recurrence in 10 years without systemic treatment.    09/08/2019 Surgery   Re-excision x2 (08/01/19 and 08/12/19) followed by right mastectomy [name]): residual carcinoma and DCIS, 1/5 lymph nodes positive, clear margins.    10/14/2019 -  Chemotherapy   The patient had palonosetron (ALOXI) injection 0.25 mg, 0.25 mg, Intravenous,  Once, 2 of 4 cycles Administration: 0.25 mg (10/14/2019), 0.25 mg (11/04/2019) pegfilgrastim (NEULASTA ONPRO KIT) injection 6 mg, 6 mg, Subcutaneous, Once, 2 of 4 cycles Administration: 6 mg (10/14/2019), 6  mg (11/04/2019) cyclophosphamide (CYTOXAN) 1,260 mg in sodium chloride 0.9 % 250 mL chemo infusion, 600 mg/m2 = 1,260 mg, Intravenous,  Once, 2 of 4 cycles Dose modification: 500 mg/m2 (original dose 600 mg/m2, Cycle 2, Reason: Dose not tolerated) Administration: 1,260 mg (10/14/2019), 1,040 mg (11/04/2019) DOCEtaxel (TAXOTERE) 160 mg in sodium chloride 0.9 % 250 mL chemo infusion, 75 mg/m2 = 160 mg, Intravenous,  Once, 2 of 4 cycles Dose modification: 65 mg/m2 (original dose 75 mg/m2, Cycle 2, Reason: Dose not tolerated) Administration: 160 mg (10/14/2019), 140 mg (11/04/2019)  for chemotherapy treatment.      CHIEF COMPLIANT: Cycle 3Taxotere and Cytoxan  INTERVAL HISTORY: Wendy [name] is a 64 y.o. with above-mentioned history of right breast cancer who underwent a lumpectomy followed by re-excision and then a mastectomy, and is currently on adjuvant chemotherapy with Taxotere and Cytoxan.She presents to the clinic todayforcycle 3.  ALLERGIES:  is allergic to hydrocodone-acetaminophen; other; vicodin [hydrocodone-acetaminophen]; and brimonidine.  MEDICATIONS:  Current Outpatient Medications  Medication Sig Dispense Refill  . dexamethasone (DECADRON) 4 MG tablet Take 1 tablet (4 mg total) by mouth daily. Takes 1 tab day before chemo and 1 tab day after chemo with food. 8 tablet 0  . diazepam (VALIUM) 2 MG tablet Take 1 tablet (2 mg total) by mouth every 12 (twelve) hours as needed for muscle spasms. 20 tablet 0  . dorzolamide-timolol (COSOPT) 22.3-6.8 MG/ML ophthalmic solution PLACE 1 DROP INTO BOTH EYES 2 (TWO) TIMES DAILY.    . fluconazole (DIFLUCAN) 100 MG tablet Take 1 tablet (100 mg total) by mouth  daily. 14 tablet 0  . latanoprost (XALATAN) 0.005 % ophthalmic solution 1 drop at bedtime.    . lidocaine-prilocaine (EMLA) cream Apply to affected area once 30 g 3  . LORazepam (ATIVAN) 0.5 MG tablet Take 1 tablet (0.5 mg total) by mouth at bedtime as needed for sleep. 30 tablet 0  .  ondansetron (ZOFRAN) 8 MG tablet Take 1 tablet (8 mg total) by mouth 2 (two) times daily as needed for refractory nausea / vomiting. Start on day 3 after chemo. 30 tablet 1  . oxyCODONE-acetaminophen (PERCOCET/ROXICET) 5-325 MG tablet Take 1 tablet by mouth every 8 (eight) hours as needed for severe pain. 20 tablet 0  . prochlorperazine (COMPAZINE) 10 MG tablet Take 1 tablet (10 mg total) by mouth every 6 (six) hours as needed (Nausea or vomiting). 30 tablet 1  . venlafaxine XR (EFFEXOR-XR) 37.5 MG 24 hr capsule TAKE 1 CAPSULE BY MOUTH EVERY DAY WITH BREAKFAST. MAY INCREASE TO 2 CAPS DAILY IF NEEDED 90 capsule 2   No current facility-administered medications for this visit.   Facility-Administered Medications Ordered in Other Visits  Medication Dose Route Frequency Provider Last Rate Last Admin  . heparin lock flush 100 unit/mL  500 Units Intravenous Once Nicholas Lose, MD      . sodium chloride flush (NS) 0.9 % injection 10 mL  10 mL Intravenous PRN Nicholas Lose, MD   10 mL at 10/14/19 1032  . sodium chloride flush (NS) 0.9 % injection 10 mL  10 mL Intracatheter PRN Nicholas Lose, MD   10 mL at 11/24/19 1038    PHYSICAL EXAMINATION: ECOG PERFORMANCE STATUS: 1 - Symptomatic but completely ambulatory  There were no vitals filed for this visit. There were no vitals filed for this visit.  LABORATORY DATA:  I have reviewed the data as listed CMP Latest Ref Rng & Units 11/04/2019 10/20/2019 10/14/2019  Glucose 70 - 99 mg/dL 141(H) 112(H) 174(H)  BUN 8 - 23 mg/dL 10 6(L) 11  Creatinine 0.44 - 1.00 mg/dL 0.78 0.71 0.74  Sodium 135 - 145 mmol/L 144 141 139  Potassium 3.5 - 5.1 mmol/L 3.7 3.9 3.7  Chloride 98 - 111 mmol/L 111 106 110  CO2 22 - 32 mmol/L _0 Calcium 8.9 - 10.3 mg/dL 9.1 9.2 9.2  Total Protein 6.5 - 8.1 g/dL 7.1 7.2 7.7  Total Bilirubin 0.3 - 1.2 mg/dL 0.4 0.9 0.3  Alkaline Phos 38 - 126 U/L 82 106 97  AST 15 - 41 U/L _1 ALT 0 - 44 U/L _2 Lab Results   Component Value Date   WBC 11.6 (H) 11/24/2019   HGB 12.8 11/24/2019   HCT 40.4 11/24/2019   MCV 86.9 11/24/2019   PLT 252 11/24/2019   NEUTROABS 9.0 (H) 11/24/2019    ASSESSMENT & PLAN:  Malignant neoplasm of upper-outer quadrant of right breast in female, estrogen receptor positive ([place]) 06/27/2019:Right lumpectomy [name]): IDC, grade 3, at least 2.5cm, involving a complex sclerosing lesion, 1.1cm, with high grade DCIS, involved margins, 1/3 right axillary lymph nodes positive, 1.4cm.ER 100%, PR 30%, Ki-67 20%, HER-2 IHC negative T2 N1 stage IIA MammaPrint: High risk, 10-year risk of recurrence untreated: 29% 09/08/2019:Re-excision x2 (08/01/19 and 08/12/19) followed by right mastectomy [name]): residual carcinoma and DCIS, 1/5 lymph nodes positive, clear margins.  Treatment plan: 1.Adjuvant chemotherapy withTaxotere and Cytoxan every 3 weeks x4 2.Adjuvant radiation therapy 3.Follow-up adjuvant antiestrogen therapywith anastrozole 1 mg daily x7 years Genetic testing ------------------------------------------------------------------------------------------------------  Current treatment: Cycle 3Taxotere and Cytoxan Chemo toxicities: 1.Thrush:  Once again she developed thrush.  I discontinued dexamethasone.  I sent a new prescription for Diflucan again. 2.Fatigue: Started on day 3 3.Muscle cramps: Stable 4.  Mild peripheral neuropathy in the right hand: Watching closely 5.  Lower extremity weakness: I encouraged her to exercise regularly by walking.  We will monitor the symptoms and if symptoms continue to get worse we may have to reduce the dosage of last cycle of chemo.  Denies any nausea or vomiting. She informed me that she is going to get breast reconstruction on 01/02/2020 with Dr. Marla Roe.  I sent a message to Dr. Marla Roe to remove her port at that time.  We will also request radiation oncology appointment on her last day of chemo.  Return to clinic in 3  weeks for cycle 4    No orders of the defined types were placed in this encounter.  The patient has a good understanding of the overall plan. she agrees with it. she will call with any problems that may develop before the next visit here.  Total time spent: 30 mins including face to face time and time spent for planning, charting and coordination of care  Nicholas Lose, MD 11/24/2019  I, Cloyde Reams Dorshimer, am acting as scribe for Dr. Nicholas Lose.  I have reviewed the above documentation for accuracy and completeness, and I agree with the above.

## 2019-11-24 ENCOUNTER — Other Ambulatory Visit: Payer: Self-pay

## 2019-11-24 ENCOUNTER — Inpatient Hospital Stay (HOSPITAL_BASED_OUTPATIENT_CLINIC_OR_DEPARTMENT_OTHER): Payer: No Typology Code available for payment source | Admitting: Hematology and Oncology

## 2019-11-24 ENCOUNTER — Inpatient Hospital Stay: Payer: No Typology Code available for payment source

## 2019-11-24 ENCOUNTER — Inpatient Hospital Stay: Payer: No Typology Code available for payment source | Attending: Hematology and Oncology

## 2019-11-24 ENCOUNTER — Other Ambulatory Visit: Payer: Self-pay | Admitting: *Deleted

## 2019-11-24 DIAGNOSIS — Z170 Estrogen receptor positive status [ER+]: Secondary | ICD-10-CM | POA: Insufficient documentation

## 2019-11-24 DIAGNOSIS — Z5111 Encounter for antineoplastic chemotherapy: Secondary | ICD-10-CM | POA: Insufficient documentation

## 2019-11-24 DIAGNOSIS — C50411 Malignant neoplasm of upper-outer quadrant of right female breast: Secondary | ICD-10-CM

## 2019-11-24 DIAGNOSIS — Z5189 Encounter for other specified aftercare: Secondary | ICD-10-CM | POA: Insufficient documentation

## 2019-11-24 DIAGNOSIS — Z95828 Presence of other vascular implants and grafts: Secondary | ICD-10-CM

## 2019-11-24 LAB — CMP (CANCER CENTER ONLY)
ALT: 13 U/L (ref 0–44)
AST: 17 U/L (ref 15–41)
Albumin: 3.6 g/dL (ref 3.5–5.0)
Alkaline Phosphatase: 69 U/L (ref 38–126)
Anion gap: 7 (ref 5–15)
BUN: 9 mg/dL (ref 8–23)
CO2: 23 mmol/L (ref 22–32)
Calcium: 9.2 mg/dL (ref 8.9–10.3)
Chloride: 111 mmol/L (ref 98–111)
Creatinine: 0.69 mg/dL (ref 0.44–1.00)
GFR, Est AFR Am: >60 mL/min (ref >60)
GFR, Estimated: >60 mL/min (ref >60)
Glucose, Bld: 130 mg/dL — ABNORMAL HIGH (ref 70–99)
Potassium: 3.8 mmol/L (ref 3.5–5.1)
Sodium: 141 mmol/L (ref 135–145)
Total Bilirubin: 0.4 mg/dL (ref 0.3–1.2)
Total Protein: 6.7 g/dL (ref 6.5–8.1)

## 2019-11-24 LAB — CBC WITH DIFFERENTIAL (CANCER CENTER ONLY)
Abs Immature Granulocytes: 0.05 10*3/uL (ref 0.00–0.07)
Basophils Absolute: 0 10*3/uL (ref 0.0–0.1)
Basophils Relative: 0 %
Eosinophils Absolute: 0 10*3/uL (ref 0.0–0.5)
Eosinophils Relative: 0 %
HCT: 40.4 % (ref 36.0–46.0)
Hemoglobin: 12.8 g/dL (ref 12.0–15.0)
Immature Granulocytes: 0 %
Lymphocytes Relative: 14 %
Lymphs Abs: 1.6 10*3/uL (ref 0.7–4.0)
MCH: 27.5 pg (ref 26.0–34.0)
MCHC: 31.7 g/dL (ref 30.0–36.0)
MCV: 86.9 fL (ref 80.0–100.0)
Monocytes Absolute: 0.9 10*3/uL (ref 0.1–1.0)
Monocytes Relative: 8 %
Neutro Abs: 9 10*3/uL — ABNORMAL HIGH (ref 1.7–7.7)
Neutrophils Relative %: 78 %
Platelet Count: 252 10*3/uL (ref 150–400)
RBC: 4.65 MIL/uL (ref 3.87–5.11)
RDW: 15.7 % — ABNORMAL HIGH (ref 11.5–15.5)
WBC Count: 11.6 10*3/uL — ABNORMAL HIGH (ref 4.0–10.5)
nRBC: 0 % (ref 0.0–0.2)

## 2019-11-24 MED ORDER — PALONOSETRON HCL INJECTION 0.25 MG/5ML
0.2500 mg | Freq: Once | INTRAVENOUS | Status: AC
  Administered 2019-11-24: 0.25 mg via INTRAVENOUS

## 2019-11-24 MED ORDER — SODIUM CHLORIDE 0.9 % IV SOLN
65.0000 mg/m2 | Freq: Once | INTRAVENOUS | Status: AC
  Administered 2019-11-24: 140 mg via INTRAVENOUS
  Filled 2019-11-24: qty 14

## 2019-11-24 MED ORDER — SODIUM CHLORIDE 0.9 % IV SOLN
4.0000 mg | Freq: Once | INTRAVENOUS | Status: AC
  Administered 2019-11-24: 4 mg via INTRAVENOUS
  Filled 2019-11-24: qty 0.4

## 2019-11-24 MED ORDER — PALONOSETRON HCL INJECTION 0.25 MG/5ML
INTRAVENOUS | Status: AC
  Filled 2019-11-24: qty 5

## 2019-11-24 MED ORDER — PEGFILGRASTIM 6 MG/0.6ML ~~LOC~~ PSKT
PREFILLED_SYRINGE | SUBCUTANEOUS | Status: AC
  Filled 2019-11-24: qty 0.6

## 2019-11-24 MED ORDER — SODIUM CHLORIDE 0.9 % IV SOLN
500.0000 mg/m2 | Freq: Once | INTRAVENOUS | Status: AC
  Administered 2019-11-24: 1040 mg via INTRAVENOUS
  Filled 2019-11-24: qty 52

## 2019-11-24 MED ORDER — SODIUM CHLORIDE 0.9 % IV SOLN
Freq: Once | INTRAVENOUS | Status: AC
  Filled 2019-11-24: qty 250

## 2019-11-24 MED ORDER — FLUCONAZOLE 100 MG PO TABS: 100.0000 mg | ORAL_TABLET | Freq: Every day | ORAL | 0 refills | Status: DC

## 2019-11-24 MED ORDER — SODIUM CHLORIDE 0.9% FLUSH
10.0000 mL | INTRAVENOUS | Status: DC | PRN
  Administered 2019-11-24: 10 mL
  Filled 2019-11-24: qty 10

## 2019-11-24 MED ORDER — HEPARIN SOD (PORK) LOCK FLUSH 100 UNIT/ML IV SOLN
500.0000 [IU] | Freq: Once | INTRAVENOUS | Status: AC | PRN
  Administered 2019-11-24: 500 [IU]
  Filled 2019-11-24: qty 5

## 2019-11-24 MED ORDER — PEGFILGRASTIM 6 MG/0.6ML ~~LOC~~ PSKT
6.0000 mg | PREFILLED_SYRINGE | Freq: Once | SUBCUTANEOUS | Status: AC
  Administered 2019-11-24: 6 mg via SUBCUTANEOUS

## 2019-11-24 NOTE — Assessment & Plan Note (Signed)
06/27/2019:Right lumpectomy Wendy [name]): IDC, grade 3, at least 2.5cm, involving a complex sclerosing lesion, 1.1cm, with high grade DCIS, involved margins, 1/3 right axillary lymph nodes positive, 1.4cm.ER 100%, PR 30%, Ki-67 20%, HER-2 IHC negative T2 N1 stage IIA MammaPrint: High risk, 10-year risk of recurrence untreated: 29% 09/08/2019:Re-excision x2 (08/01/19 and 08/12/19) followed by right mastectomy Wendy [name]): residual carcinoma and DCIS, 1/5 lymph nodes positive, clear margins.  Treatment plan: 1.Adjuvant chemotherapy withTaxotere and Cytoxan every 3 weeks x4 2.Adjuvant radiation therapy 3.Follow-up adjuvant antiestrogen therapywith anastrozole 1 mg daily x7 years Genetic testing ------------------------------------------------------------------------------------------------------ Current treatment: Cycle 3Taxotere and Cytoxan Chemo toxicities: 1.Thrush: Resolved with Diflucan 2.Fatigue: Started on day 3 3.Muscle cramps: I discussed with her about taking magnesium supplementation as well as drinking lots of water. 4.  ANC nadir was 0 therefore I did reduce the dosage of cycle 2  Denies any nausea or vomiting. Denies neuropathy.  Return to clinic in 3 weeks for cycle 4

## 2019-11-24 NOTE — Patient Instructions (Signed)

## 2019-11-24 NOTE — Patient Instructions (Signed)
[place] Discharge Instructions for Patients Receiving Chemotherapy  Today you received the following chemotherapy agents: Docetaxel (Taxotere), Cyclophosphamide (Cytoxan)  To help prevent nausea and vomiting after your treatment, we encourage you to take your nausea medication as directed by your MD.   If you develop nausea and vomiting that is not controlled by your nausea medication, call the clinic.   BELOW ARE SYMPTOMS THAT SHOULD BE REPORTED IMMEDIATELY:  *FEVER GREATER THAN 100.5 F  *CHILLS WITH OR WITHOUT FEVER  NAUSEA AND VOMITING THAT IS NOT CONTROLLED WITH YOUR NAUSEA MEDICATION  *UNUSUAL SHORTNESS OF BREATH  *UNUSUAL BRUISING OR BLEEDING  TENDERNESS IN MOUTH AND THROAT WITH OR WITHOUT PRESENCE OF ULCERS  *URINARY PROBLEMS  *BOWEL PROBLEMS  UNUSUAL RASH Items with * indicate a potential emergency and should be followed up as soon as possible.  Feel free to call the clinic should you have any questions or concerns. The clinic phone number is [phone].  Please show the [place] at check-in to the Emergency Department and triage nurse.   Pegfilgrastim injection What is this medicine? PEGFILGRASTIM (PEG fil gra stim) is a long-acting granulocyte colony-stimulating factor that stimulates the growth of neutrophils, a type of white blood cell important in the body's fight against infection. It is used to reduce the incidence of fever and infection in patients with certain types of cancer who are receiving chemotherapy that affects the bone marrow, and to increase survival after being exposed to high doses of radiation. This medicine may be used for other purposes; ask your health care provider or pharmacist if you have questions. COMMON BRAND NAME(S): Steve Rattler, Ziextenzo What should I tell my health care provider before I take this medicine? They need to know if you have any of these conditions:  kidney disease  latex  allergy  ongoing radiation therapy  sickle cell disease  skin reactions to acrylic adhesives (On-Body Injector only)  an unusual or allergic reaction to pegfilgrastim, filgrastim, other medicines, foods, dyes, or preservatives  pregnant or trying to get pregnant  breast-feeding How should I use this medicine? This medicine is for injection under the skin. If you get this medicine at home, you will be taught how to prepare and give the pre-filled syringe or how to use the On-body Injector. Refer to the patient Instructions for Use for detailed instructions. Use exactly as directed. Tell your healthcare provider immediately if you suspect that the On-body Injector may not have performed as intended or if you suspect the use of the On-body Injector resulted in a missed or partial dose. It is important that you put your used needles and syringes in a special sharps container. Do not put them in a trash can. If you do not have a sharps container, call your pharmacist or healthcare provider to get one. Talk to your pediatrician regarding the use of this medicine in children. While this drug may be prescribed for selected conditions, precautions do apply. Overdosage: If you think you have taken too much of this medicine contact a poison control center or emergency room at once. NOTE: This medicine is only for you. Do not share this medicine with others. What if I miss a dose? It is important not to miss your dose. Call your doctor or health care professional if you miss your dose. If you miss a dose due to an On-body Injector failure or leakage, a new dose should be administered as soon as possible using a single prefilled  syringe for manual use. What may interact with this medicine? Interactions have not been studied. Give your health care provider a list of all the medicines, herbs, non-prescription drugs, or dietary supplements you use. Also tell them if you smoke, drink alcohol, or use illegal  drugs. Some items may interact with your medicine. This list may not describe all possible interactions. Give your health care provider a list of all the medicines, herbs, non-prescription drugs, or dietary supplements you use. Also tell them if you smoke, drink alcohol, or use illegal drugs. Some items may interact with your medicine. What should I watch for while using this medicine? You may need blood work done while you are taking this medicine. If you are going to need a MRI, CT scan, or other procedure, tell your doctor that you are using this medicine (On-Body Injector only). What side effects may I notice from receiving this medicine? Side effects that you should report to your doctor or health care professional as soon as possible:  allergic reactions like skin rash, itching or hives, swelling of the face, lips, or tongue  back pain  dizziness  fever  pain, redness, or irritation at site where injected  pinpoint red spots on the skin  red or dark-brown urine  shortness of breath or breathing problems  stomach or side pain, or pain at the shoulder  swelling  tiredness  trouble passing urine or change in the amount of urine Side effects that usually do not require medical attention (report to your doctor or health care professional if they continue or are bothersome):  bone pain  muscle pain This list may not describe all possible side effects. Call your doctor for medical advice about side effects. You may report side effects to FDA at 1-800-FDA-1088. Where should I keep my medicine? Keep out of the reach of children. If you are using this medicine at home, you will be instructed on how to store it. Throw away any unused medicine after the expiration date on the label. NOTE: This sheet is a summary. It may not cover all possible information. If you have questions about this medicine, talk to your doctor, pharmacist, or health care provider.  2020 Elsevier/Gold  Standard (2017-09-28 16:57:08)  Coronavirus (COVID-19) Are you at risk?  Are you at risk for the Coronavirus (COVID-19)?  To be considered HIGH RISK for Coronavirus (COVID-19), you have to meet the following criteria:  . Traveled to [place], [place], [place], [place] or [place]; or in the [place] to [place], [place], [place], or [place]; and have fever, cough, and shortness of breath within the last 2 weeks of travel OR . Been in close contact with a person diagnosed with COVID-19 within the last 2 weeks and have fever, cough, and shortness of breath . IF YOU DO NOT MEET THESE CRITERIA, YOU ARE CONSIDERED LOW RISK FOR COVID-19.  What to do if you are HIGH RISK for COVID-19?  Marland Kitchen If you are having a medical emergency, call 911. . Seek medical care right away. Before you go to a doctor's office, urgent care or emergency department, call ahead and tell them about your recent travel, contact with someone diagnosed with COVID-19, and your symptoms. You should receive instructions from your physician's office regarding next steps of care.  . When you arrive at healthcare provider, tell the healthcare staff immediately you have returned from visiting [place], [place], [place], [place] or [place]; or traveled in the [place] to [place], [place], or [place]; in the last two weeks or you have been in close contact with a person diagnosed with COVID-19 in the last 2 weeks.   . Tell the health care staff about your symptoms: fever, cough and shortness of breath. . After you have been seen by a medical provider, you will be either: o Tested for (COVID-19) and discharged home on quarantine except to seek medical care if symptoms worsen, and asked to  - Stay home and avoid contact with others until you get your results (4-5 days)  - Avoid travel on public transportation if possible (such as bus, train, or airplane) or o Sent to the Emergency Department by EMS for evaluation,  COVID-19 testing, and possible admission depending on your condition and test results.  What to do if you are LOW RISK for COVID-19?  Reduce your risk of any infection by using the same precautions used for avoiding the common cold or flu:  Marland Kitchen Wash your hands often with soap and warm water for at least 20 seconds.  If soap and water are not readily available, use an alcohol-based hand sanitizer with at least 60% alcohol.  . If coughing or sneezing, cover your mouth and nose by coughing or sneezing into the elbow areas of your shirt or coat, into a tissue or into your sleeve (not your hands). . Avoid shaking hands with others and consider head nods or verbal greetings only. . Avoid touching your eyes, nose, or mouth with unwashed hands.  . Avoid close contact with people who are sick. . Avoid places or events with large numbers of people in one location, like concerts or sporting events. . Carefully consider travel plans you have or are making. . If you are planning any travel outside or inside the [place], visit the CDC's Travelers' Health webpage for the latest health notices. . If you have some symptoms but not all symptoms, continue to monitor at home and seek medical attention if your symptoms worsen. . If you are having a medical emergency, call 911.   [place] / e-Visit: [email or web address]         [facility]: [facility]: [id number]                   [facility]: [phone]

## 2019-11-25 ENCOUNTER — Ambulatory Visit: Payer: 59

## 2019-11-28 ENCOUNTER — Telehealth: Payer: Self-pay | Admitting: *Deleted

## 2019-11-28 NOTE — Telephone Encounter (Signed)
Received call from pt with complaint of dry hacking cough after treatment.  Pt states cough is not persistent and tends to get better after several days after treatment. Pt denies SHOB.  Per MD pt to monitor cough and will assess during next office visit.  Pt educated to contact the office if symptoms persist or become worse.  Pt verbalized understanding.

## 2019-11-29 NOTE — Progress Notes (Signed)
Patient is a 64 year old female here for follow-up after undergoing a right mastectomy with Dr. [name] and reconstruction with placement of tissue expander with Dr. Marla Roe on 09/08/2019.  She started chemotherapy on 10/14/2019 and will begin radiation after that.  She began lymphedema therapy on 10/21/2019.  Today patient reports she is doing very well.  Denies fever, chest pain, shortness of breath, nausea/vomiting.  Her incision is healing very well, C/D/I.  No signs of infection, drainage, redness, seroma/hematoma.  She feels like she is pretty close to the size she would like to be, will do small fill today.    We placed injectable saline in the Expander using a sterile technique: Right: 50 cc for a total of 680 / 650 cc  Plan is to have a left mastopexy / reduction for symmetry at the time of exchange of right tissue expander for implant. Will plan to remove Port PICC at time of surgery. Surgery currently scheduled for 01/02/20.  Pre-op appointment scheduled for 6/9. Call office with any questions/concerns.   The [place] was signed into law in 2016 which includes the topic of electronic health records.  This provides immediate access to information in MyChart.  This includes consultation notes, operative notes, office notes, lab results and pathology reports.  If you have any questions about what you read please let us know at your next visit or call us at the office.  We are right here with you.

## 2019-11-30 ENCOUNTER — Other Ambulatory Visit: Payer: Self-pay

## 2019-11-30 ENCOUNTER — Encounter: Payer: Self-pay | Admitting: Plastic Surgery

## 2019-11-30 ENCOUNTER — Ambulatory Visit (INDEPENDENT_AMBULATORY_CARE_PROVIDER_SITE_OTHER): Payer: No Typology Code available for payment source | Admitting: Plastic Surgery

## 2019-11-30 VITALS — BP 132/79 | HR 107 | Temp 97.5°F | Ht 63.0 in | Wt 213.0 lb

## 2019-11-30 DIAGNOSIS — Z9011 Acquired absence of right breast and nipple: Secondary | ICD-10-CM

## 2019-12-01 ENCOUNTER — Encounter: Payer: Self-pay | Admitting: *Deleted

## 2019-12-01 ENCOUNTER — Inpatient Hospital Stay (HOSPITAL_BASED_OUTPATIENT_CLINIC_OR_DEPARTMENT_OTHER): Payer: No Typology Code available for payment source | Admitting: Medical

## 2019-12-01 ENCOUNTER — Other Ambulatory Visit: Payer: Self-pay

## 2019-12-01 ENCOUNTER — Telehealth: Payer: Self-pay | Admitting: *Deleted

## 2019-12-01 VITALS — BP 115/67 | HR 106 | Temp 98.8°F | Resp 20 | Wt 208.0 lb

## 2019-12-01 DIAGNOSIS — K228 Other specified diseases of esophagus: Secondary | ICD-10-CM

## 2019-12-01 DIAGNOSIS — K2289 Other specified disease of esophagus: Secondary | ICD-10-CM

## 2019-12-01 DIAGNOSIS — R1310 Dysphagia, unspecified: Secondary | ICD-10-CM

## 2019-12-01 DIAGNOSIS — R1319 Other dysphagia: Secondary | ICD-10-CM

## 2019-12-01 DIAGNOSIS — Z5111 Encounter for antineoplastic chemotherapy: Secondary | ICD-10-CM | POA: Diagnosis not present

## 2019-12-01 DIAGNOSIS — C50411 Malignant neoplasm of upper-outer quadrant of right female breast: Secondary | ICD-10-CM

## 2019-12-01 DIAGNOSIS — Z170 Estrogen receptor positive status [ER+]: Secondary | ICD-10-CM

## 2019-12-01 MED ORDER — CARISOPRODOL-ASPIRIN-CODEINE 200-325-16 MG PO TABS: 1.0000 | ORAL_TABLET | Freq: Four times a day (QID) | ORAL | 0 refills | Status: DC | PRN

## 2019-12-01 MED ORDER — MAGIC MOUTHWASH W/LIDOCAINE: 5.0000 mL | Freq: Four times a day (QID) | ORAL | 2 refills | Status: DC | PRN

## 2019-12-01 NOTE — Telephone Encounter (Signed)
Pt called c/o difficulty swallowing due to recent thrush. Pt has been added to [facility] to be seen. Message sent to scheduling to add on for 11am. Pt notified of time.

## 2019-12-02 NOTE — Progress Notes (Signed)
Symptoms Management Clinic Progress Note   Wendy [name] [id number] 10-08-1955 64 y.o.  Wendy [name] is managed by Dr.Vinay Lindi Adie  Actively treated with chemotherapy/immunotherapy/hormonal therapy: yes  Current therapy: Cyclophosphamide and docetaxel with pegfilgrastim support  Last treated: 11/24/2019 (cycle 3, day 1)  Next scheduled appointment with provider: 12/15/2019  Assessment: Plan:    Esophageal dysphagia - Plan: magic mouthwash w/lidocaine SOLN, carisoprodol-aspirin-codeine (SOMA COMPOUND WITH CODEINE) 200-325-16 MG TABS tablet  Esophageal pain - Plan: carisoprodol-aspirin-codeine (SOMA COMPOUND WITH CODEINE) 200-325-16 MG TABS tablet  Malignant neoplasm of upper-outer quadrant of right breast in female, estrogen receptor positive ([place])   Esophageal dysphagia and pain: The patient was given a prescription for Magic mouthwash and Soma Compound with codeine.  Oxycodone was added to her allergy list as it causes itching.  ER positive malignant neoplasm of the right breast: The patient continues to be managed by Dr.Vinay Gudena and is status post cycle 3, day 1 of Cytoxan and Taxotere with Neulasta support.  She is scheduled to be seen in follow-up on 12/15/2019.  Please see After Visit Summary for patient specific instructions.  Future Appointments  Date Time Provider [place]  12/14/2019  9:20 AM Threasa Heads, PA-C [facility] None  12/14/2019 12:30 PM Kyung Rudd, MD [facility] None  12/15/2019  9:30 AM [facility] LAB 5 [facility] None  12/15/2019  9:45 AM [facility] [place] FLUSH [facility] None  12/15/2019 10:15 AM Nicholas Lose, MD [facility] None  12/15/2019 11:15 AM [facility] INFUSION [facility] None  12/29/2019 10:30 AM [facility]ING [facility] None  01/10/2020 10:00 AM Dillingham, Loel Lofty, DO [facility] None  01/24/2020 10:00 AM Young, Johanna C, PA-C [facility] None    No orders of the defined types were placed in this encounter.      Subjective:    Patient ID:  Wendy [name] is a 64 y.o. (DOB [birth year]) female.  Chief Complaint: No chief complaint on file.   HPI Wendy [name]  is a 64 y.o. female with a diagnosis of an ER positive malignant neoplasm of the right breast. She is followed by Dr.Vinay Lindi Adie and is status post cycle 3, day 1 of Cytoxan and Taxotere with Neulasta support.  She presents to the clinic today with a sore throat since yesterday and myalgias since having her Neulasta shot on Friday.  She began Diflucan on Monday.  She continues to have a nonproductive cough.  She denies fevers, chills, sweats, nausea, vomiting, constipation, or diarrhea.  She was most recently on oxycodone but reports that it causes her to have itching.  Medications: I have reviewed the patient's current medications.  Allergies:  Allergies  Allergen Reactions  . Hydrocodone-Acetaminophen Itching and Other (See Comments)    SEVERE    . Oxycodone Itching  . Other Other (See Comments)    rakuten eyedrops causes eye redness   . Vicodin [Hydrocodone-Acetaminophen] Itching    SEVERE   . Brimonidine Rash    Past Medical History:  Diagnosis Date  . Breast cancer ([place])   . Colon cancer ([place])   . DDD (degenerative disc disease), cervical   . DDD (degenerative disc disease), lumbar   . ESOPHAGITIS, REFLUX   . Glaucoma   . HELICOBACTER PYLORI INFECTION   . HEMORRHOIDS, INTERNAL   . History of colon cancer   . History of colon polyps 2019  . Hyperlipidemia   . Radius fracture 2006   Left, Distal  . Thoracic spondylosis   . Tinnitus, bilateral 2013    Past  Surgical History:  Procedure Laterality Date  . BREAST LUMPECTOMY WITH RADIOACTIVE SEED AND SENTINEL LYMPH NODE BIOPSY Right 06/27/2019   Procedure: RIGHT BREAST LUMPECTOMY WITH RADIOACTIVE SEED AND SENTINEL LYMPH NODE BIOPSY AND RIGHT TARGETED NODE DISSECTION;  Surgeon: Jovita Kussmaul, MD;  Location: [facility];  Service: General;  Laterality: Right;  . BREAST  RECONSTRUCTION WITH PLACEMENT OF TISSUE EXPANDER AND FLEX HD (ACELLULAR HYDRATED DERMIS) Right 09/08/2019   Procedure: BREAST RECONSTRUCTION WITH PLACEMENT OF TISSUE EXPANDER AND FLEX HD (ACELLULAR HYDRATED DERMIS);  Surgeon: Wallace Going, DO;  Location: [facility];  Service: Plastics;  Laterality: Right;  . CESAREAN SECTION  1989  . COLONOSCOPY  2019  . HYSTEROSCOPY WITH D & C N/A 09/17/2018   Procedure: DILATATION AND CURETTAGE Marlene Bast;  Surgeon: Brien Few, MD;  Location: [place];  Service: Gynecology;  Laterality: N/A;  . PORTACATH PLACEMENT Left 08/01/2019   Procedure: INSERTION PORT-A-CATH;  Surgeon: Jovita Kussmaul, MD;  Location: [facility];  Service: General;  Laterality: Left;  . RE-EXCISION OF BREAST CANCER,SUPERIOR MARGINS Right 08/12/2019   Procedure: RE-EXCISION OF RIGHT BREAST,SUPERIOR MARGINS;  Surgeon: Jovita Kussmaul, MD;  Location: [facility];  Service: General;  Laterality: Right;  . RE-EXCISION OF BREAST LUMPECTOMY Right 08/01/2019   Procedure: RE-EXCISION OF RIGHT BREAST CANCER  MARGINS;  Surgeon: Jovita Kussmaul, MD;  Location: [facility];  Service: General;  Laterality: Right;  . SIMPLE MASTECTOMY WITH AXILLARY SENTINEL NODE BIOPSY Right 09/08/2019   Procedure: RIGHT BREAST MASTECTOMY;  Surgeon: Jovita Kussmaul, MD;  Location: [facility];  Service: General;  Laterality: Right;  . TONSILLECTOMY     age 25 years old  . WRIST FRACTURE SURGERY  2006    Family History  Problem Relation Age of Onset  . Diabetes Mother   . Brain cancer Mother        mets  . Cancer Mother        breast-right  . Breast cancer Mother        d. 76  . Diabetes Father   . Hypertension Father   . Cancer Father        lung  . Heart disease Father   . Lung cancer Father   . Diabetes Sister   . Hypertension Sister   . Glaucoma Sister   . Diabetes Brother   . Hypertension Brother    . Heart disease Brother        congestive heart failure  . Other Son 16       car accident  . Colon cancer Maternal Grandmother 59       d. 31  . Dementia Paternal Grandfather   . Breast cancer Other        MGMs mother  . Other Maternal Uncle        possibly drowning  . Brain cancer Niece 1       d. 2 years    Social History   Socioeconomic History  . Marital status: Significant Other    Spouse name: Not on file  . Number of children: Not on file  . Years of education: Not on file  . Highest education level: Not on file  Occupational History  . Occupation: medical    Employer: [employer]  Tobacco Use  . Smoking status: Never Smoker  . Smokeless tobacco: Never Used  Substance and Sexual Activity  . Alcohol use: Yes  Comment: occ  . Drug use: No  . Sexual activity: Not Currently  Other Topics Concern  . Not on file  Social History Narrative  . Not on file   Social Determinants of Health   Financial Resource Strain:   . Difficulty of Paying Living Expenses:   Food Insecurity:   . Worried About Charity fundraiser in the Last Year:   . Arboriculturist in the Last Year:   Transportation Needs:   . Film/video editor (Medical):   Marland Kitchen Lack of Transportation (Non-Medical):   Physical Activity:   . Days of Exercise per Week:   . Minutes of Exercise per Session:   Stress:   . Feeling of Stress :   Social Connections:   . Frequency of Communication with Friends and Family:   . Frequency of Social Gatherings with Friends and Family:   . Attends Religious Services:   . Active Member of Clubs or Organizations:   . Attends Archivist Meetings:   Marland Kitchen Marital Status:   Intimate Partner Violence:   . Fear of Current or Ex-Partner:   . Emotionally Abused:   Marland Kitchen Physically Abused:   . Sexually Abused:     Past Medical History, Surgical history, Social history, and Family history were reviewed and updated as appropriate.   Please see review  of systems for further details on the patient's review from today.   Review of Systems:  Review of Systems  Constitutional: Negative for chills, diaphoresis and fever.  HENT: Positive for sore throat. Negative for trouble swallowing and voice change.   Respiratory: Positive for cough. Negative for chest tightness, shortness of breath and wheezing.   Cardiovascular: Negative for chest pain and palpitations.  Gastrointestinal: Negative for abdominal pain, constipation, diarrhea, nausea and vomiting.  Musculoskeletal: Positive for myalgias. Negative for back pain.  Neurological: Negative for dizziness, light-headedness and headaches.    Objective:   Physical Exam:  BP 115/67 (BP Location: Left Arm, Patient Position: Sitting)   Pulse (!) 106   Temp 98.8 F (37.1 C) (Oral)   Resp 20   Wt 94.3 kg (208 lb)   SpO2 100%   BMI 36.85 kg/m  ECOG: 0  Physical Exam Constitutional:      General: She is not in acute distress.    Appearance: She is not diaphoretic.  HENT:     Head: Normocephalic and atraumatic.     Right Ear: Tympanic membrane, ear canal and external ear normal.     Left Ear: Tympanic membrane, ear canal and external ear normal.     Mouth/Throat:     Mouth: Mucous membranes are moist.     Pharynx: Oropharynx is clear. No oropharyngeal exudate or posterior oropharyngeal erythema.  Eyes:     General: No scleral icterus.       Right eye: No discharge.        Left eye: No discharge.  Cardiovascular:     Rate and Rhythm: Normal rate and regular rhythm.     Heart sounds: Normal heart sounds. No murmur. No friction rub. No gallop.   Pulmonary:     Effort: Pulmonary effort is normal. No respiratory distress.     Breath sounds: Normal breath sounds. No wheezing or rales.  Skin:    General: Skin is warm and dry.     Findings: No erythema or rash.  Neurological:     Mental Status: She is alert.     Coordination: Coordination normal.  Gait: Gait normal.  Psychiatric:         Mood and Affect: Mood normal.        Behavior: Behavior normal.        Thought Content: Thought content normal.        Judgment: Judgment normal.     Lab Review:     Component Value Date/Time   NA 141 11/24/2019 1035   NA 142 04/30/2018 1708   K 3.8 11/24/2019 1035   CL 111 11/24/2019 1035   CO2 23 11/24/2019 1035   GLUCOSE 130 (H) 11/24/2019 1035   BUN 9 11/24/2019 1035   BUN 9 04/30/2018 1708   CREATININE 0.69 11/24/2019 1035   CREATININE 0.70 03/04/2013 1303   CALCIUM 9.2 11/24/2019 1035   PROT 6.7 11/24/2019 1035   PROT 7.0 04/30/2018 1708   ALBUMIN 3.6 11/24/2019 1035   ALBUMIN 4.2 04/30/2018 1708   AST 17 11/24/2019 1035   ALT 13 11/24/2019 1035   ALKPHOS 69 11/24/2019 1035   BILITOT 0.4 11/24/2019 1035   GFRNONAA >60 11/24/2019 1035   GFRAA >60 11/24/2019 1035       Component Value Date/Time   WBC 11.6 (H) 11/24/2019 1035   WBC 6.3 09/17/2018 1100   RBC 4.65 11/24/2019 1035   HGB 12.8 11/24/2019 1035   HGB 15.3 08/31/2017 1146   HCT 40.4 11/24/2019 1035   HCT 45.7 08/31/2017 1146   PLT 252 11/24/2019 1035   PLT 208 08/31/2017 1146   MCV 86.9 11/24/2019 1035   MCV 84 08/31/2017 1146   MCH 27.5 11/24/2019 1035   MCHC 31.7 11/24/2019 1035   RDW 15.7 (H) 11/24/2019 1035   RDW 14.2 08/31/2017 1146   LYMPHSABS 1.6 11/24/2019 1035   MONOABS 0.9 11/24/2019 1035   EOSABS 0.0 11/24/2019 1035   BASOSABS 0.0 11/24/2019 1035   -------------------------------  Imaging from last 24 hours (if applicable):  Radiology interpretation: No results found.

## 2019-12-06 ENCOUNTER — Other Ambulatory Visit: Payer: Self-pay | Admitting: Medical

## 2019-12-06 DIAGNOSIS — G893 Neoplasm related pain (acute) (chronic): Secondary | ICD-10-CM

## 2019-12-06 MED ORDER — ACETAMINOPHEN-CODEINE #3 300-30 MG PO TABS: 1.0000 | ORAL_TABLET | ORAL | 0 refills | Status: DC | PRN

## 2019-12-09 NOTE — Progress Notes (Signed)
Pharmacist Chemotherapy Monitoring - Follow Up Assessment    I verify that I have reviewed each item in the below checklist:  . Regimen for the patient is scheduled for the appropriate day and plan matches scheduled date. Marland Kitchen Appropriate non-routine labs are ordered dependent on drug ordered. . If applicable, additional medications reviewed and ordered per protocol based on lifetime cumulative doses and/or treatment regimen.   Plan for follow-up and/or issues identified: No . I-vent associated with next due treatment: No . MD and/or nursing notified: No  Kaylum Shrum K 12/09/2019 10:51 AM

## 2019-12-12 NOTE — H&P (View-Only) (Signed)
ICD-10-CM   1. S/P mastectomy, right  Z90.11   2. Malignant neoplasm of upper-outer quadrant of right breast in female, estrogen receptor positive [facility])  C50.411    Z17.0       Patient ID: Wendy [name], female    DOB: 04-05-1956, 64 y.o.   MRN: [id number]   History of Present Illness: Wendy [name] is a 64 y.o.  female  with a history of right breast mastectomy with placement of tissue expander.  She presents for preoperative evaluation for upcoming procedure, removal of right breast tissue expander and placement of implant, left breast mastopexy/reduction for symmetry, and removal of Port-A-Cath, scheduled for 01/02/2020 with Dr. Marla Roe.  Summary from previous visit: Patient underwent right mastectomy with Dr. [name] and reconstruction with placement of tissue expander with Dr. Marla Roe on 09/08/2019.  She started chemotherapy on 10/14/2019 and will begin radiation shortly.  She has been undergoing lymphedema therapy since 10/21/2019.  She is interested in a left mastopexy/reduction for symmetry at the time of exchange of right tissue expander for implant.  Port-A-Cath to be removed at time of surgery.  Injectable saline was placed in the right tissue expander for a total of 680 / 650 cc  Last Chemo treatment is tomorrow 12/15/19. She meets with radiation in a few days to determine when she will start treatment.   PMH Significant for: Breast cancer, colon cancer, degenerative disc disease, and HLD.  The patient has not had problems with anesthesia.   Past Medical History: Allergies: Allergies  Allergen Reactions  . Hydrocodone-Acetaminophen Itching and Other (See Comments)    SEVERE    . Oxycodone Itching  . Other Other (See Comments)    rakuten eyedrops causes eye redness   . Vicodin [Hydrocodone-Acetaminophen] Itching    SEVERE   . Brimonidine Rash    Current Medications:  Current Outpatient Medications:  .  acetaminophen-codeine (TYLENOL #3) 300-30 MG tablet, Take  1-2 tablets by mouth every 4 (four) hours as needed for moderate pain., Disp: 40 tablet, Rfl: 0 .  carisoprodol-aspirin-codeine (SOMA COMPOUND WITH CODEINE) 200-325-16 MG TABS tablet, Take 1 tablet by mouth 4 (four) times daily as needed., Disp: 30 tablet, Rfl: 0 .  diazepam (VALIUM) 2 MG tablet, Take 1 tablet (2 mg total) by mouth every 12 (twelve) hours as needed for muscle spasms., Disp: 20 tablet, Rfl: 0 .  dorzolamide-timolol (COSOPT) 22.3-6.8 MG/ML ophthalmic solution, PLACE 1 DROP INTO BOTH EYES 2 (TWO) TIMES DAILY., Disp: , Rfl:  .  fluconazole (DIFLUCAN) 100 MG tablet, Take 1 tablet (100 mg total) by mouth daily., Disp: 14 tablet, Rfl: 0 .  latanoprost (XALATAN) 0.005 % ophthalmic solution, 1 drop at bedtime., Disp: , Rfl:  .  lidocaine-prilocaine (EMLA) cream, Apply to affected area once, Disp: 30 g, Rfl: 3 .  LORazepam (ATIVAN) 0.5 MG tablet, Take 1 tablet (0.5 mg total) by mouth at bedtime as needed for sleep., Disp: 30 tablet, Rfl: 0 .  magic mouthwash w/lidocaine SOLN, Take 5 mLs by mouth 4 (four) times daily as needed for mouth pain., Disp: 240 mL, Rfl: 2 .  ondansetron (ZOFRAN) 8 MG tablet, Take 1 tablet (8 mg total) by mouth 2 (two) times daily as needed for refractory nausea / vomiting. Start on day 3 after chemo., Disp: 30 tablet, Rfl: 1 .  prochlorperazine (COMPAZINE) 10 MG tablet, Take 1 tablet (10 mg total) by mouth every 6 (six) hours as needed (Nausea or vomiting)., Disp: 30 tablet, Rfl: 1 .  venlafaxine XR (EFFEXOR-XR) 37.5 MG 24 hr capsule, TAKE 1 CAPSULE BY MOUTH EVERY DAY WITH BREAKFAST. MAY INCREASE TO 2 CAPS DAILY IF NEEDED, Disp: 90 capsule, Rfl: 2 No current facility-administered medications for this visit.  Facility-Administered Medications Ordered in Other Visits:  .  heparin lock flush 100 unit/mL, 500 Units, Intravenous, Once, Gudena, Vinay, MD .  sodium chloride flush (NS) 0.9 % injection 10 mL, 10 mL, Intravenous, PRN, Nicholas Lose, MD, 10 mL at 10/14/19  1032  Past Medical Problems: Past Medical History:  Diagnosis Date  . Breast cancer ([place])   . Colon cancer ([place])   . DDD (degenerative disc disease), cervical   . DDD (degenerative disc disease), lumbar   . ESOPHAGITIS, REFLUX   . Glaucoma   . HELICOBACTER PYLORI INFECTION   . HEMORRHOIDS, INTERNAL   . History of colon cancer   . History of colon polyps 2019  . Hyperlipidemia   . Radius fracture 2006   Left, Distal  . Thoracic spondylosis   . Tinnitus, bilateral 2013    Past Surgical History: Past Surgical History:  Procedure Laterality Date  . BREAST LUMPECTOMY WITH RADIOACTIVE SEED AND SENTINEL LYMPH NODE BIOPSY Right 06/27/2019   Procedure: RIGHT BREAST LUMPECTOMY WITH RADIOACTIVE SEED AND SENTINEL LYMPH NODE BIOPSY AND RIGHT TARGETED NODE DISSECTION;  Surgeon: Jovita Kussmaul, MD;  Location: [place];  Service: General;  Laterality: Right;  . BREAST RECONSTRUCTION WITH PLACEMENT OF TISSUE EXPANDER AND FLEX HD (ACELLULAR HYDRATED DERMIS) Right 09/08/2019   Procedure: BREAST RECONSTRUCTION WITH PLACEMENT OF TISSUE EXPANDER AND FLEX HD (ACELLULAR HYDRATED DERMIS);  Surgeon: Wallace Going, DO;  Location: [place];  Service: Plastics;  Laterality: Right;  . CESAREAN SECTION  1989  . COLONOSCOPY  2019  . HYSTEROSCOPY WITH D & C N/A 09/17/2018   Procedure: DILATATION AND CURETTAGE Marlene Bast;  Surgeon: Brien Few, MD;  Location: [place];  Service: Gynecology;  Laterality: N/A;  . PORTACATH PLACEMENT Left 08/01/2019   Procedure: INSERTION PORT-A-CATH;  Surgeon: Jovita Kussmaul, MD;  Location: [place];  Service: General;  Laterality: Left;  . RE-EXCISION OF BREAST CANCER,SUPERIOR MARGINS Right 08/12/2019   Procedure: RE-EXCISION OF RIGHT BREAST,SUPERIOR MARGINS;  Surgeon: Jovita Kussmaul, MD;  Location: [place];  Service: General;  Laterality: Right;  . RE-EXCISION OF BREAST  LUMPECTOMY Right 08/01/2019   Procedure: RE-EXCISION OF RIGHT BREAST CANCER  MARGINS;  Surgeon: Jovita Kussmaul, MD;  Location: [place];  Service: General;  Laterality: Right;  . SIMPLE MASTECTOMY WITH AXILLARY SENTINEL NODE BIOPSY Right 09/08/2019   Procedure: RIGHT BREAST MASTECTOMY;  Surgeon: Jovita Kussmaul, MD;  Location: [place];  Service: General;  Laterality: Right;  . TONSILLECTOMY     age 64 years old  . WRIST FRACTURE SURGERY  2006    Social History: Social History   Socioeconomic History  . Marital status: Significant Other    Spouse name: Not on file  . Number of children: Not on file  . Years of education: Not on file  . Highest education level: Not on file  Occupational History  . Occupation: medical    Employer: [employer]  Tobacco Use  . Smoking status: Never Smoker  . Smokeless tobacco: Never Used  Substance and Sexual Activity  . Alcohol use: Yes    Comment: occ  . Drug use: No  . Sexual activity: Not Currently  Other Topics Concern  .  Not on file  Social History Narrative  . Not on file   Social Determinants of Health   Financial Resource Strain:   . Difficulty of Paying Living Expenses:   Food Insecurity:   . Worried About Charity fundraiser in the Last Year:   . Arboriculturist in the Last Year:   Transportation Needs:   . Film/video editor (Medical):   Marland Kitchen Lack of Transportation (Non-Medical):   Physical Activity:   . Days of Exercise per Week:   . Minutes of Exercise per Session:   Stress:   . Feeling of Stress :   Social Connections:   . Frequency of Communication with Friends and Family:   . Frequency of Social Gatherings with Friends and Family:   . Attends Religious Services:   . Active Member of Clubs or Organizations:   . Attends Archivist Meetings:   Marland Kitchen Marital Status:   Intimate Partner Violence:   . Fear of Current or Ex-Partner:   . Emotionally Abused:   Marland Kitchen  Physically Abused:   . Sexually Abused:     Family History: Family History  Problem Relation Age of Onset  . Diabetes Mother   . Brain cancer Mother        mets  . Cancer Mother        breast-right  . Breast cancer Mother        d. 109  . Diabetes Father   . Hypertension Father   . Cancer Father        lung  . Heart disease Father   . Lung cancer Father   . Diabetes Sister   . Hypertension Sister   . Glaucoma Sister   . Diabetes Brother   . Hypertension Brother   . Heart disease Brother        congestive heart failure  . Other Son 16       car accident  . Colon cancer Maternal Grandmother 8       d. 8  . Dementia Paternal Grandfather   . Breast cancer Other        MGMs mother  . Other Maternal Uncle        possibly drowning  . Brain cancer Niece 1       d. 2 years    Review of Systems: Review of Systems  Constitutional: Negative for chills and fever.  HENT: Negative for congestion and sore throat.   Respiratory: Negative for cough and shortness of breath.   Cardiovascular: Negative for chest pain and palpitations.  Gastrointestinal: Negative for abdominal pain, nausea and vomiting.  Musculoskeletal: Negative for back pain, joint pain, myalgias and neck pain.  Skin: Negative for itching and rash.    Physical Exam: Vital Signs There were no vitals taken for this visit. Physical Exam Constitutional:      Appearance: Normal appearance. She is normal weight.  HENT:     Head: Normocephalic and atraumatic.     Comments: Hair loss from chemotherapy Eyes:     Extraocular Movements: Extraocular movements intact.  Cardiovascular:     Rate and Rhythm: Normal rate and regular rhythm.     Pulses: Normal pulses.     Heart sounds: Normal heart sounds.  Pulmonary:     Effort: Pulmonary effort is normal.     Breath sounds: Normal breath sounds. No wheezing, rhonchi or rales.  Chest:       Comments: Right breat tissue expander in place. Incision healed  well,  c/d/i. No sings of infection, redness, drainage, swelling.  Left breast free from rashes and infection. Abdominal:     General: Bowel sounds are normal.     Palpations: Abdomen is soft.  Musculoskeletal:        General: No swelling. Normal range of motion.     Cervical back: Normal range of motion.  Skin:    General: Skin is warm and dry.     Coloration: Skin is not pale.     Findings: No erythema or rash.  Neurological:     General: No focal deficit present.     Mental Status: She is alert and oriented to person, place, and time.  Psychiatric:        Mood and Affect: Mood normal.        Behavior: Behavior normal.        Thought Content: Thought content normal.        Judgment: Judgment normal.     Assessment/Plan:  Ms. Tuch scheduled for removal of right breast tissue expander and placement of implant, left breast mastopexy/reduction for symmetry, and removal of Port-A-Cath with Dr. Marla Roe.  Risks, benefits, and alternatives of procedure discussed, questions answered and consent obtained.    Smoking Status: Non-smoker Last Mammogram: MRI 08/24/2019; Results: Malignancy in the upper outer quadrant of the right breast.  No evidence of left breast malignancy.  No enlarged or abnormal appearing lymph nodes bilaterally.  Caprini Score: 9 High; Risk Factors include: 64 year old female, history of cancer, Port-A-Cath,, BMI > 25, and length of planned surgery. Recommendation for mechanical and pharmacological prophylaxis during surgery. Encourage early ambulation.   Pictures obtained: 11/01/19  Post-op Rx sent to pharmacy: none - patient has pain medication and nausea medication from oncology she is using.  Patient was provided with the General Surgical Risk consent document and Pain Medication Agreement prior to their appointment.  They had adequate time to read through the risk consent documents and Pain Medication Agreement. We also discussed them in person together during this  preop appointment. All of their questions were answered to their satisfaction.  Recommended calling if they have any further questions.  Risk consent form and Pain Medication Agreement to be scanned into patient's chart.  The [place] was signed into law in 2016 which includes the topic of electronic health records.  This provides immediate access to information in MyChart.  This includes consultation notes, operative notes, office notes, lab results and pathology reports.  If you have any questions about what you read please let us know at your next visit or call us at the office.  We are right here with you.   Electronically signed by: Threasa Heads, PA-C 12/12/2019 7:56 PM

## 2019-12-12 NOTE — Progress Notes (Signed)
ICD-10-CM   1. S/P mastectomy, right  Z90.11   2. Malignant neoplasm of upper-outer quadrant of right breast in female, estrogen receptor positive [facility])  C50.411    Z17.0       Patient ID: [name], female    DOB: 04-03-1956, 64 y.o.   MRN: [id number]   History of Present Illness: CARSYN BOSTER is a 63 y.o.  female  with a history of right breast mastectomy with placement of tissue expander.  She presents for preoperative evaluation for upcoming procedure, removal of right breast tissue expander and placement of implant, left breast mastopexy/reduction for symmetry, and removal of Port-A-Cath, scheduled for 01/02/2020 with Dr. Marla Roe.  Summary from previous visit: Patient underwent right mastectomy with Dr. [name] and reconstruction with placement of tissue expander with Dr. Marla Roe on 09/08/2019.  She started chemotherapy on 10/14/2019 and will begin radiation shortly.  She has been undergoing lymphedema therapy since 10/21/2019.  She is interested in a left mastopexy/reduction for symmetry at the time of exchange of right tissue expander for implant.  Port-A-Cath to be removed at time of surgery.  Injectable saline was placed in the right tissue expander for a total of 680 / 650 cc  Last Chemo treatment is tomorrow 12/15/19. She meets with radiation in a few days to determine when she will start treatment.   PMH Significant for: Breast cancer, colon cancer, degenerative disc disease, and HLD.  The patient has not had problems with anesthesia.   Past Medical History: Allergies: Allergies  Allergen Reactions  . Hydrocodone-Acetaminophen Itching and Other (See Comments)    SEVERE    . Oxycodone Itching  . Other Other (See Comments)    rakuten eyedrops causes eye redness   . Vicodin [Hydrocodone-Acetaminophen] Itching    SEVERE   . Brimonidine Rash    Current Medications:  Current Outpatient Medications:  .  acetaminophen-codeine (TYLENOL #3) 300-30 MG tablet, Take  1-2 tablets by mouth every 4 (four) hours as needed for moderate pain., Disp: 40 tablet, Rfl: 0 .  carisoprodol-aspirin-codeine (SOMA COMPOUND WITH CODEINE) 200-325-16 MG TABS tablet, Take 1 tablet by mouth 4 (four) times daily as needed., Disp: 30 tablet, Rfl: 0 .  diazepam (VALIUM) 2 MG tablet, Take 1 tablet (2 mg total) by mouth every 12 (twelve) hours as needed for muscle spasms., Disp: 20 tablet, Rfl: 0 .  dorzolamide-timolol (COSOPT) 22.3-6.8 MG/ML ophthalmic solution, PLACE 1 DROP INTO BOTH EYES 2 (TWO) TIMES DAILY., Disp: , Rfl:  .  fluconazole (DIFLUCAN) 100 MG tablet, Take 1 tablet (100 mg total) by mouth daily., Disp: 14 tablet, Rfl: 0 .  latanoprost (XALATAN) 0.005 % ophthalmic solution, 1 drop at bedtime., Disp: , Rfl:  .  lidocaine-prilocaine (EMLA) cream, Apply to affected area once, Disp: 30 g, Rfl: 3 .  LORazepam (ATIVAN) 0.5 MG tablet, Take 1 tablet (0.5 mg total) by mouth at bedtime as needed for sleep., Disp: 30 tablet, Rfl: 0 .  magic mouthwash w/lidocaine SOLN, Take 5 mLs by mouth 4 (four) times daily as needed for mouth pain., Disp: 240 mL, Rfl: 2 .  ondansetron (ZOFRAN) 8 MG tablet, Take 1 tablet (8 mg total) by mouth 2 (two) times daily as needed for refractory nausea / vomiting. Start on day 3 after chemo., Disp: 30 tablet, Rfl: 1 .  prochlorperazine (COMPAZINE) 10 MG tablet, Take 1 tablet (10 mg total) by mouth every 6 (six) hours as needed (Nausea or vomiting)., Disp: 30 tablet, Rfl: 1 .  venlafaxine XR (EFFEXOR-XR) 37.5 MG 24 hr capsule, TAKE 1 CAPSULE BY MOUTH EVERY DAY WITH BREAKFAST. MAY INCREASE TO 2 CAPS DAILY IF NEEDED, Disp: 90 capsule, Rfl: 2 No current facility-administered medications for this visit.  Facility-Administered Medications Ordered in Other Visits:  .  heparin lock flush 100 unit/mL, 500 Units, Intravenous, Once, Gudena, Vinay, MD .  sodium chloride flush (NS) 0.9 % injection 10 mL, 10 mL, Intravenous, PRN, Nicholas Lose, MD, 10 mL at 10/14/19  1032  Past Medical Problems: Past Medical History:  Diagnosis Date  . Breast cancer ([place])   . Colon cancer ([place])   . DDD (degenerative disc disease), cervical   . DDD (degenerative disc disease), lumbar   . ESOPHAGITIS, REFLUX   . Glaucoma   . HELICOBACTER PYLORI INFECTION   . HEMORRHOIDS, INTERNAL   . History of colon cancer   . History of colon polyps 2019  . Hyperlipidemia   . Radius fracture 2006   Left, Distal  . Thoracic spondylosis   . Tinnitus, bilateral 2013    Past Surgical History: Past Surgical History:  Procedure Laterality Date  . BREAST LUMPECTOMY WITH RADIOACTIVE SEED AND SENTINEL LYMPH NODE BIOPSY Right 06/27/2019   Procedure: RIGHT BREAST LUMPECTOMY WITH RADIOACTIVE SEED AND SENTINEL LYMPH NODE BIOPSY AND RIGHT TARGETED NODE DISSECTION;  Surgeon: Jovita Kussmaul, MD;  Location: [place];  Service: General;  Laterality: Right;  . BREAST RECONSTRUCTION WITH PLACEMENT OF TISSUE EXPANDER AND FLEX HD (ACELLULAR HYDRATED DERMIS) Right 09/08/2019   Procedure: BREAST RECONSTRUCTION WITH PLACEMENT OF TISSUE EXPANDER AND FLEX HD (ACELLULAR HYDRATED DERMIS);  Surgeon: Wallace Going, DO;  Location: [place];  Service: Plastics;  Laterality: Right;  . CESAREAN SECTION  1989  . COLONOSCOPY  2019  . HYSTEROSCOPY WITH D & C N/A 09/17/2018   Procedure: DILATATION AND CURETTAGE Marlene Bast;  Surgeon: Brien Few, MD;  Location: [place];  Service: Gynecology;  Laterality: N/A;  . PORTACATH PLACEMENT Left 08/01/2019   Procedure: INSERTION PORT-A-CATH;  Surgeon: Jovita Kussmaul, MD;  Location: [place];  Service: General;  Laterality: Left;  . RE-EXCISION OF BREAST CANCER,SUPERIOR MARGINS Right 08/12/2019   Procedure: RE-EXCISION OF RIGHT BREAST,SUPERIOR MARGINS;  Surgeon: Jovita Kussmaul, MD;  Location: [place];  Service: General;  Laterality: Right;  . RE-EXCISION OF BREAST  LUMPECTOMY Right 08/01/2019   Procedure: RE-EXCISION OF RIGHT BREAST CANCER  MARGINS;  Surgeon: Jovita Kussmaul, MD;  Location: [place];  Service: General;  Laterality: Right;  . SIMPLE MASTECTOMY WITH AXILLARY SENTINEL NODE BIOPSY Right 09/08/2019   Procedure: RIGHT BREAST MASTECTOMY;  Surgeon: Jovita Kussmaul, MD;  Location: [place];  Service: General;  Laterality: Right;  . TONSILLECTOMY     age 85 years old  . WRIST FRACTURE SURGERY  2006    Social History: Social History   Socioeconomic History  . Marital status: Significant Other    Spouse name: Not on file  . Number of children: Not on file  . Years of education: Not on file  . Highest education level: Not on file  Occupational History  . Occupation: medical    Employer: [employer]  Tobacco Use  . Smoking status: Never Smoker  . Smokeless tobacco: Never Used  Substance and Sexual Activity  . Alcohol use: Yes    Comment: occ  . Drug use: No  . Sexual activity: Not Currently  Other Topics Concern  .  Not on file  Social History Narrative  . Not on file   Social Determinants of Health   Financial Resource Strain:   . Difficulty of Paying Living Expenses:   Food Insecurity:   . Worried About Charity fundraiser in the Last Year:   . Arboriculturist in the Last Year:   Transportation Needs:   . Film/video editor (Medical):   Marland Kitchen Lack of Transportation (Non-Medical):   Physical Activity:   . Days of Exercise per Week:   . Minutes of Exercise per Session:   Stress:   . Feeling of Stress :   Social Connections:   . Frequency of Communication with Friends and Family:   . Frequency of Social Gatherings with Friends and Family:   . Attends Religious Services:   . Active Member of Clubs or Organizations:   . Attends Archivist Meetings:   Marland Kitchen Marital Status:   Intimate Partner Violence:   . Fear of Current or Ex-Partner:   . Emotionally Abused:   Marland Kitchen  Physically Abused:   . Sexually Abused:     Family History: Family History  Problem Relation Age of Onset  . Diabetes Mother   . Brain cancer Mother        mets  . Cancer Mother        breast-right  . Breast cancer Mother        d. 99  . Diabetes Father   . Hypertension Father   . Cancer Father        lung  . Heart disease Father   . Lung cancer Father   . Diabetes Sister   . Hypertension Sister   . Glaucoma Sister   . Diabetes Brother   . Hypertension Brother   . Heart disease Brother        congestive heart failure  . Other Son 16       car accident  . Colon cancer Maternal Grandmother 6       d. 41  . Dementia Paternal Grandfather   . Breast cancer Other        MGMs mother  . Other Maternal Uncle        possibly drowning  . Brain cancer Niece 1       d. 2 years    Review of Systems: Review of Systems  Constitutional: Negative for chills and fever.  HENT: Negative for congestion and sore throat.   Respiratory: Negative for cough and shortness of breath.   Cardiovascular: Negative for chest pain and palpitations.  Gastrointestinal: Negative for abdominal pain, nausea and vomiting.  Musculoskeletal: Negative for back pain, joint pain, myalgias and neck pain.  Skin: Negative for itching and rash.    Physical Exam: Vital Signs There were no vitals taken for this visit. Physical Exam Constitutional:      Appearance: Normal appearance. She is normal weight.  HENT:     Head: Normocephalic and atraumatic.     Comments: Hair loss from chemotherapy Eyes:     Extraocular Movements: Extraocular movements intact.  Cardiovascular:     Rate and Rhythm: Normal rate and regular rhythm.     Pulses: Normal pulses.     Heart sounds: Normal heart sounds.  Pulmonary:     Effort: Pulmonary effort is normal.     Breath sounds: Normal breath sounds. No wheezing, rhonchi or rales.  Chest:       Comments: Right breat tissue expander in place. Incision healed  well,  c/d/i. No sings of infection, redness, drainage, swelling.  Left breast free from rashes and infection. Abdominal:     General: Bowel sounds are normal.     Palpations: Abdomen is soft.  Musculoskeletal:        General: No swelling. Normal range of motion.     Cervical back: Normal range of motion.  Skin:    General: Skin is warm and dry.     Coloration: Skin is not pale.     Findings: No erythema or rash.  Neurological:     General: No focal deficit present.     Mental Status: She is alert and oriented to person, place, and time.  Psychiatric:        Mood and Affect: Mood normal.        Behavior: Behavior normal.        Thought Content: Thought content normal.        Judgment: Judgment normal.     Assessment/Plan:  Ms. Staheli scheduled for removal of right breast tissue expander and placement of implant, left breast mastopexy/reduction for symmetry, and removal of Port-A-Cath with Dr. Marla Roe.  Risks, benefits, and alternatives of procedure discussed, questions answered and consent obtained.    Smoking Status: Non-smoker Last Mammogram: MRI 08/24/2019; Results: Malignancy in the upper outer quadrant of the right breast.  No evidence of left breast malignancy.  No enlarged or abnormal appearing lymph nodes bilaterally.  Caprini Score: 9 High; Risk Factors include: 64 year old female, history of cancer, Port-A-Cath,, BMI > 25, and length of planned surgery. Recommendation for mechanical and pharmacological prophylaxis during surgery. Encourage early ambulation.   Pictures obtained: 11/01/19  Post-op Rx sent to pharmacy: none - patient has pain medication and nausea medication from oncology she is using.  Patient was provided with the General Surgical Risk consent document and Pain Medication Agreement prior to their appointment.  They had adequate time to read through the risk consent documents and Pain Medication Agreement. We also discussed them in person together during this  preop appointment. All of their questions were answered to their satisfaction.  Recommended calling if they have any further questions.  Risk consent form and Pain Medication Agreement to be scanned into patient's chart.  The [place] was signed into law in 2016 which includes the topic of electronic health records.  This provides immediate access to information in MyChart.  This includes consultation notes, operative notes, office notes, lab results and pathology reports.  If you have any questions about what you read please let us know at your next visit or call us at the office.  We are right here with you.   Electronically signed by: Threasa Heads, PA-C 12/12/2019 7:56 PM

## 2019-12-13 NOTE — Progress Notes (Signed)
Location of Breast Cancer:Malignant neoplasm of UOQ right breast.   Did patient present with symptoms (if so, please note symptoms) or was this found on screening mammography?: Palpable mass in right breast at 11 o'clock position, 1.7 cm.  Histology per Pathology Report: Right Mastectomy 09/08/2019   Right Breast Re-excision 08/12/2019   Right Breast Re-excision 08/01/2019     Right Breast Biopsy 05/20/2019  Receptor Status: ER(+ 100%), PR (+ 30%), Her2-neu (-), Ki-67(20%)    Past/Anticipated interventions by surgeon, if any: Dr. Toth -Right lumpectomy 06/27/2019 -Re-excision x2 08/01/19 & 08/12/19 followed by mastectomy, residual carcinoma and DCIS, 1/5 lymph nodes positive, clear margins. -Mastectomy 09/08/2019  -Will see Dr. Dillingham on 01/02/2020 to have tissue expanders removed and implants placed.  Past/Anticipated interventions by medical oncology, if any: Chemotherapy  Dr. Gudena 11/24/2019 Treatment plan: 1.Adjuvant chemotherapy withTaxotere and Cytoxan every 3 weeks x4 10/14/2019  2.Adjuvant radiation therapy 3.Follow-up adjuvant antiestrogen therapywith anastrozole 1 mg daily x7 years Genetic testing -She informed me that she is going to get breast reconstruction on 01/02/2020 with Dr. Dillingham -Final cycle - Cycle 4 12/15/2019  Lymphedema issues, if any:  No  Pain issues, if any:  No  SAFETY ISSUES:  Prior radiation? No  Pacemaker/ICD? No  Possible current pregnancy? Post menopausal  Is the patient on methotrexate? No  Current Complaints / other details:   -Oncotype testing- Mammaprint: High risk, 29% chance of recurrence in 10 years without systemic treatment.     Silva, LaToya M, RN 12/13/2019,3:31 PM   

## 2019-12-14 ENCOUNTER — Ambulatory Visit
Admission: RE | Admit: 2019-12-14 | Discharge: 2019-12-14 | Disposition: A | Discharge status: Home / Self Care | Payer: No Typology Code available for payment source | Source: Ambulatory Visit | Attending: Radiation Oncology | Admitting: Radiation Oncology

## 2019-12-14 ENCOUNTER — Encounter: Payer: Self-pay | Admitting: Plastic Surgery

## 2019-12-14 ENCOUNTER — Ambulatory Visit (INDEPENDENT_AMBULATORY_CARE_PROVIDER_SITE_OTHER): Payer: No Typology Code available for payment source | Admitting: Plastic Surgery

## 2019-12-14 ENCOUNTER — Other Ambulatory Visit: Payer: Self-pay

## 2019-12-14 ENCOUNTER — Encounter: Payer: Self-pay | Admitting: Radiation Oncology

## 2019-12-14 VITALS — Ht 63.0 in | Wt 212.0 lb

## 2019-12-14 VITALS — BP 133/83 | HR 102 | Temp 97.7°F | Ht 63.0 in | Wt 211.6 lb

## 2019-12-14 DIAGNOSIS — Z170 Estrogen receptor positive status [ER+]: Secondary | ICD-10-CM

## 2019-12-14 DIAGNOSIS — C50411 Malignant neoplasm of upper-outer quadrant of right female breast: Secondary | ICD-10-CM

## 2019-12-14 DIAGNOSIS — Z9011 Acquired absence of right breast and nipple: Secondary | ICD-10-CM

## 2019-12-14 NOTE — Progress Notes (Signed)
 Patient Care Team: Greene, Jeffrey R, MD as PCP - General (Family Medicine) Stuart, Dawn C, RN as Oncology Nurse Navigator Martini, Keisha N, RN as Oncology Nurse Navigator  DIAGNOSIS:    ICD-10-CM   1. Malignant neoplasm of upper-outer quadrant of right breast in female, estrogen receptor positive ([facility])  C50.411    Z17.0     SUMMARY OF ONCOLOGIC HISTORY: Oncology History  Malignant neoplasm of upper-outer quadrant of right breast in female, estrogen receptor positive ([facility])  05/20/2019 Initial Diagnosis   Palpable right breast mass superficial, ultrasound 11 o'clock position 1.7 cm: Biopsy revealed grade 3 IDC with DCIS ER 100%, PR 30%, Ki-67 20%, HER-2 by IHC negative, axilla lymph node biopsy positive   06/01/2019 Cancer Staging   Staging form: Breast, AJCC 8th Edition - Clinical stage from 06/01/2019: Stage IIA (cT1c, cN1, cM0, G3, ER+, PR+, HER2-) - Signed by Gudena, Vinay, MD on 06/01/2019   06/27/2019 Surgery   Right lumpectomy (Toth): IDC, grade 3, at least 2.5cm, involving a complex sclerosing lesion, 1.1cm, with high grade DCIS, involved margins, 1/3 right axillary lymph nodes positive, 1.4cm.    07/11/2019 Cancer Staging   Staging form: Breast, AJCC 8th Edition - Pathologic stage from 07/11/2019: Stage IIA (pT2, pN1a, cM0, G3, ER+, PR+, HER2-) - Signed by Gudena, Vinay, MD on 07/11/2019   07/19/2019 Oncotype testing   Mammaprint: high risk, 29% chance of recurrence in 10 years without systemic treatment.    09/08/2019 Surgery   Re-excision x2 (08/01/19 and 08/12/19) followed by right mastectomy (Toth): residual carcinoma and DCIS, 1/5 lymph nodes positive, clear margins.    10/14/2019 -  Chemotherapy   The patient had palonosetron (ALOXI) injection 0.25 mg, 0.25 mg, Intravenous,  Once, 3 of 4 cycles Administration: 0.25 mg (10/14/2019), 0.25 mg (11/04/2019), 0.25 mg (11/24/2019) pegfilgrastim (NEULASTA ONPRO KIT) injection 6 mg, 6 mg, Subcutaneous, Once, 3 of 4  cycles Administration: 6 mg (10/14/2019), 6 mg (11/04/2019), 6 mg (11/24/2019) cyclophosphamide (CYTOXAN) 1,260 mg in sodium chloride 0.9 % 250 mL chemo infusion, 600 mg/m2 = 1,260 mg, Intravenous,  Once, 3 of 4 cycles Dose modification: 500 mg/m2 (original dose 600 mg/m2, Cycle 2, Reason: Dose not tolerated) Administration: 1,260 mg (10/14/2019), 1,040 mg (11/04/2019), 1,040 mg (11/24/2019) DOCEtaxel (TAXOTERE) 160 mg in sodium chloride 0.9 % 250 mL chemo infusion, 75 mg/m2 = 160 mg, Intravenous,  Once, 3 of 4 cycles Dose modification: 65 mg/m2 (original dose 75 mg/m2, Cycle 2, Reason: Dose not tolerated) Administration: 160 mg (10/14/2019), 140 mg (11/04/2019), 140 mg (11/24/2019)  for chemotherapy treatment.      CHIEF COMPLIANT: Cycle4Taxotere and Cytoxan  INTERVAL HISTORY: Wendy [name] is a 64 y.o. with above-mentioned history of right breast cancer who underwent a lumpectomy followed by re-excision and then a mastectomy, and is currently on adjuvant chemotherapy with Taxotere and Cytoxan.She presents to the clinic todayforcycle4.  Her major complaint is lower extremity and upper extremity weakness.  She has had more fatigue this round than before.  Denies any nausea or vomiting.  Denies any worsening neuropathy.  ALLERGIES:  is allergic to hydrocodone-acetaminophen, oxycodone, other, vicodin [hydrocodone-acetaminophen], and brimonidine.  MEDICATIONS:  Current Outpatient Medications  Medication Sig Dispense Refill  . acetaminophen-codeine (TYLENOL #3) 300-30 MG tablet Take 1-2 tablets by mouth every 4 (four) hours as needed for moderate pain. 40 tablet 0  . carisoprodol-aspirin-codeine (SOMA COMPOUND WITH CODEINE) 200-325-16 MG TABS tablet Take 1 tablet by mouth 4 (four) times daily as needed. 30 tablet 0  .   diazepam (VALIUM) 2 MG tablet Take 1 tablet (2 mg total) by mouth every 12 (twelve) hours as needed for muscle spasms. 20 tablet 0  . dorzolamide-timolol (COSOPT) 22.3-6.8 MG/ML  ophthalmic solution PLACE 1 DROP INTO BOTH EYES 2 (TWO) TIMES DAILY.    . fluconazole (DIFLUCAN) 100 MG tablet Take 1 tablet (100 mg total) by mouth daily. 14 tablet 0  . gabapentin (NEURONTIN) 100 MG capsule Take 100 mg by mouth 3 (three) times daily.    Marland Kitchen latanoprost (XALATAN) 0.005 % ophthalmic solution 1 drop at bedtime.    . lidocaine-prilocaine (EMLA) cream Apply to affected area once 30 g 3  . LORazepam (ATIVAN) 0.5 MG tablet Take 1 tablet (0.5 mg total) by mouth at bedtime as needed for sleep. 30 tablet 0  . magic mouthwash w/lidocaine SOLN Take 5 mLs by mouth 4 (four) times daily as needed for mouth pain. 240 mL 2  . ondansetron (ZOFRAN) 8 MG tablet Take 1 tablet (8 mg total) by mouth 2 (two) times daily as needed for refractory nausea / vomiting. Start on day 3 after chemo. 30 tablet 1  . prochlorperazine (COMPAZINE) 10 MG tablet Take 1 tablet (10 mg total) by mouth every 6 (six) hours as needed (Nausea or vomiting). 30 tablet 1  . venlafaxine XR (EFFEXOR-XR) 37.5 MG 24 hr capsule TAKE 1 CAPSULE BY MOUTH EVERY DAY WITH BREAKFAST. MAY INCREASE TO 2 CAPS DAILY IF NEEDED 90 capsule 2   No current facility-administered medications for this visit.   Facility-Administered Medications Ordered in Other Visits  Medication Dose Route Frequency Provider Last Rate Last Admin  . heparin lock flush 100 unit/mL  500 Units Intravenous Once Nicholas Lose, MD      . sodium chloride flush (NS) 0.9 % injection 10 mL  10 mL Intravenous PRN Nicholas Lose, MD   10 mL at 10/14/19 1032    PHYSICAL EXAMINATION: ECOG PERFORMANCE STATUS: 1 - Symptomatic but completely ambulatory  Vitals:   12/15/19 1005  BP: 132/75  Pulse: 93  Resp: 18  Temp: 98.5 F (36.9 C)  SpO2: 100%   Filed Weights   12/15/19 1005  Weight: 213 lb 8 oz (96.8 kg)    LABORATORY DATA:  I have reviewed the data as listed CMP Latest Ref Rng & Units 11/24/2019 11/04/2019 10/20/2019  Glucose 70 - 99 mg/dL 130(H) 141(H) 112(H)  BUN 8  - 23 mg/dL 9 10 6(L)  Creatinine 0.44 - 1.00 mg/dL 0.69 0.78 0.71  Sodium 135 - 145 mmol/L 141 144 141  Potassium 3.5 - 5.1 mmol/L 3.8 3.7 3.9  Chloride 98 - 111 mmol/L 111 111 106  CO2 22 - 32 mmol/L _0 Calcium 8.9 - 10.3 mg/dL 9.2 9.1 9.2  Total Protein 6.5 - 8.1 g/dL 6.7 7.1 7.2  Total Bilirubin 0.3 - 1.2 mg/dL 0.4 0.4 0.9  Alkaline Phos 38 - 126 U/L 69 82 106  AST 15 - 41 U/L _1 ALT 0 - 44 U/L _2 Lab Results  Component Value Date   WBC 4.6 12/15/2019   HGB 12.1 12/15/2019   HCT 38.5 12/15/2019   MCV 89.5 12/15/2019   PLT 233 12/15/2019   NEUTROABS 2.2 12/15/2019    ASSESSMENT & PLAN:  Malignant neoplasm of upper-outer quadrant of right breast in female, estrogen receptor positive ([place]) 06/27/2019:Right lumpectomy [name]): IDC, grade 3, at least 2.5cm, involving a complex sclerosing lesion, 1.1cm, with high grade DCIS, involved margins, 1/3  right axillary lymph nodes positive, 1.4cm.ER 100%, PR 30%, Ki-67 20%, HER-2 IHC negative T2 N1 stage IIA MammaPrint: High risk, 10-year risk of recurrence untreated: 29% 09/08/2019:Re-excision x2 (08/01/19 and 08/12/19) followed by right mastectomy (Toth): residual carcinoma and DCIS, 1/5 lymph nodes positive, clear margins.  Treatment plan: 1.Adjuvant chemotherapy withTaxotere and Cytoxan every 3 weeks x4 2.Adjuvant radiation therapy 3.Follow-up adjuvant antiestrogen therapywith anastrozole 1 mg daily x7 years Genetic testing ------------------------------------------------------------------------------------------------------ Current treatment: Cycle4Taxotere and Cytoxan Chemo toxicities: 1.Thrush:  Recurrent thrush in spite of stopping dexamethasone.  She has Magic mouthwash and another round of Diflucan if necessary. 2.Fatigue: Started on day 3 3.Muscle cramps: Stable 4. Mild peripheral neuropathy in the right hand: Watching closely 5.  Lower extremity weakness: I encouraged her to exercise  regularly by walking.  We will monitor the symptoms and if symptoms continue to get worse we may have to reduce the dosage of last cycle of chemo.  Denies any nausea or vomiting. She informed me that she is going to get breast reconstruction on 01/02/2020 with Dr. Dillingham.  This concludes chemotherapy.  We will see her back after radiation is complete.  No orders of the defined types were placed in this encounter.  The patient has a good understanding of the overall plan. she agrees with it. she will call with any problems that may develop before the next visit here.  Total time spent: 30 mins including face to face time and time spent for planning, charting and coordination of care  Gudena, Vinay, MD 12/15/2019  I, Molly Dorshimer, am acting as scribe for Dr. Vinay Gudena.  I have reviewed the above documentation for accuracy and completeness, and I agree with the above.        

## 2019-12-15 ENCOUNTER — Inpatient Hospital Stay: Payer: No Typology Code available for payment source

## 2019-12-15 ENCOUNTER — Inpatient Hospital Stay (HOSPITAL_BASED_OUTPATIENT_CLINIC_OR_DEPARTMENT_OTHER): Payer: No Typology Code available for payment source | Admitting: Hematology and Oncology

## 2019-12-15 ENCOUNTER — Encounter: Payer: Self-pay | Admitting: *Deleted

## 2019-12-15 ENCOUNTER — Other Ambulatory Visit: Payer: Self-pay

## 2019-12-15 ENCOUNTER — Inpatient Hospital Stay: Payer: No Typology Code available for payment source | Attending: Hematology and Oncology

## 2019-12-15 DIAGNOSIS — Z5189 Encounter for other specified aftercare: Secondary | ICD-10-CM | POA: Insufficient documentation

## 2019-12-15 DIAGNOSIS — C50411 Malignant neoplasm of upper-outer quadrant of right female breast: Secondary | ICD-10-CM

## 2019-12-15 DIAGNOSIS — Z170 Estrogen receptor positive status [ER+]: Secondary | ICD-10-CM | POA: Insufficient documentation

## 2019-12-15 DIAGNOSIS — Z95828 Presence of other vascular implants and grafts: Secondary | ICD-10-CM

## 2019-12-15 DIAGNOSIS — Z5111 Encounter for antineoplastic chemotherapy: Secondary | ICD-10-CM | POA: Diagnosis not present

## 2019-12-15 LAB — CBC WITH DIFFERENTIAL (CANCER CENTER ONLY)
Abs Immature Granulocytes: 0.01 10*3/uL (ref 0.00–0.07)
Basophils Absolute: 0 10*3/uL (ref 0.0–0.1)
Basophils Relative: 0 %
Eosinophils Absolute: 0 10*3/uL (ref 0.0–0.5)
Eosinophils Relative: 0 %
HCT: 38.5 % (ref 36.0–46.0)
Hemoglobin: 12.1 g/dL (ref 12.0–15.0)
Immature Granulocytes: 0 %
Lymphocytes Relative: 31 %
Lymphs Abs: 1.4 10*3/uL (ref 0.7–4.0)
MCH: 28.1 pg (ref 26.0–34.0)
MCHC: 31.4 g/dL (ref 30.0–36.0)
MCV: 89.5 fL (ref 80.0–100.0)
Monocytes Absolute: 0.9 10*3/uL (ref 0.1–1.0)
Monocytes Relative: 20 %
Neutro Abs: 2.2 10*3/uL (ref 1.7–7.7)
Neutrophils Relative %: 49 %
Platelet Count: 233 10*3/uL (ref 150–400)
RBC: 4.3 MIL/uL (ref 3.87–5.11)
RDW: 17 % — ABNORMAL HIGH (ref 11.5–15.5)
WBC Count: 4.6 10*3/uL (ref 4.0–10.5)
nRBC: 0 % (ref 0.0–0.2)

## 2019-12-15 LAB — CMP (CANCER CENTER ONLY)
ALT: 11 U/L (ref 0–44)
AST: 16 U/L (ref 15–41)
Albumin: 3.2 g/dL — ABNORMAL LOW (ref 3.5–5.0)
Alkaline Phosphatase: 58 U/L (ref 38–126)
Anion gap: 6 (ref 5–15)
BUN: 7 mg/dL — ABNORMAL LOW (ref 8–23)
CO2: 23 mmol/L (ref 22–32)
Calcium: 8.6 mg/dL — ABNORMAL LOW (ref 8.9–10.3)
Chloride: 111 mmol/L (ref 98–111)
Creatinine: 0.65 mg/dL (ref 0.44–1.00)
GFR, Est AFR Am: >60 mL/min (ref >60)
GFR, Estimated: >60 mL/min (ref >60)
Glucose, Bld: 121 mg/dL — ABNORMAL HIGH (ref 70–99)
Potassium: 3.7 mmol/L (ref 3.5–5.1)
Sodium: 140 mmol/L (ref 135–145)
Total Bilirubin: 0.5 mg/dL (ref 0.3–1.2)
Total Protein: 5.8 g/dL — ABNORMAL LOW (ref 6.5–8.1)

## 2019-12-15 MED ORDER — SODIUM CHLORIDE 0.9% FLUSH
10.0000 mL | INTRAVENOUS | Status: DC | PRN
  Administered 2019-12-15: 10 mL
  Filled 2019-12-15: qty 10

## 2019-12-15 MED ORDER — SODIUM CHLORIDE 0.9 % IV SOLN
500.0000 mg/m2 | Freq: Once | INTRAVENOUS | Status: AC
  Administered 2019-12-15: 1040 mg via INTRAVENOUS
  Filled 2019-12-15: qty 52

## 2019-12-15 MED ORDER — PALONOSETRON HCL INJECTION 0.25 MG/5ML
INTRAVENOUS | Status: AC
  Filled 2019-12-15: qty 5

## 2019-12-15 MED ORDER — SODIUM CHLORIDE 0.9 % IV SOLN: 4.0000 mg | Freq: Once | INTRAVENOUS | Status: DC

## 2019-12-15 MED ORDER — PALONOSETRON HCL INJECTION 0.25 MG/5ML
0.2500 mg | Freq: Once | INTRAVENOUS | Status: AC
  Administered 2019-12-15: 0.25 mg via INTRAVENOUS

## 2019-12-15 MED ORDER — HEPARIN SOD (PORK) LOCK FLUSH 100 UNIT/ML IV SOLN
500.0000 [IU] | Freq: Once | INTRAVENOUS | Status: AC | PRN
  Administered 2019-12-15: 500 [IU]
  Filled 2019-12-15: qty 5

## 2019-12-15 MED ORDER — PEGFILGRASTIM 6 MG/0.6ML ~~LOC~~ PSKT
6.0000 mg | PREFILLED_SYRINGE | Freq: Once | SUBCUTANEOUS | Status: AC
  Administered 2019-12-15: 6 mg via SUBCUTANEOUS

## 2019-12-15 MED ORDER — SODIUM CHLORIDE 0.9 % IV SOLN
Freq: Once | INTRAVENOUS | Status: AC
  Filled 2019-12-15: qty 250

## 2019-12-15 MED ORDER — DEXAMETHASONE SODIUM PHOSPHATE 10 MG/ML IJ SOLN
INTRAMUSCULAR | Status: AC
  Filled 2019-12-15: qty 1

## 2019-12-15 MED ORDER — DEXAMETHASONE SODIUM PHOSPHATE 10 MG/ML IJ SOLN
4.0000 mg | Freq: Once | INTRAMUSCULAR | Status: AC
  Administered 2019-12-15: 4 mg via INTRAVENOUS

## 2019-12-15 MED ORDER — SODIUM CHLORIDE 0.9 % IV SOLN
65.0000 mg/m2 | Freq: Once | INTRAVENOUS | Status: AC
  Administered 2019-12-15: 140 mg via INTRAVENOUS
  Filled 2019-12-15: qty 14

## 2019-12-15 MED ORDER — PEGFILGRASTIM 6 MG/0.6ML ~~LOC~~ PSKT
PREFILLED_SYRINGE | SUBCUTANEOUS | Status: AC
  Filled 2019-12-15: qty 0.6

## 2019-12-15 NOTE — Assessment & Plan Note (Signed)
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  Treatment plan: 1.Adjuvant chemotherapy withTaxotere and Cytoxan every 3 weeks x4 2.Adjuvant radiation therapy 3.Follow-up adjuvant antiestrogen therapywith anastrozole 1 mg daily x7 years Genetic testing ------------------------------------------------------------------------------------------------------ Current treatment: Cycle4Taxotere and Cytoxan Chemo toxicities: 1.Thrush: Once again she developed thrush.  I discontinued dexamethasone.  I sent a new prescription for Diflucan again. 2.Fatigue: Started on day 3 3.Muscle cramps: Stable 4. Mild peripheral neuropathy in the right hand: Watching closely 5.  Lower extremity weakness: I encouraged her to exercise regularly by walking.  We will monitor the symptoms and if symptoms continue to get worse we may have to reduce the dosage of last cycle of chemo.  Denies any nausea or vomiting. She informed me that she is going to get breast reconstruction on 01/02/2020 with Dr. Marla Roe.  This concludes chemotherapy.  We will see her back after radiation is complete.

## 2019-12-15 NOTE — Patient Instructions (Signed)
[facility] Discharge Instructions for Patients Receiving Chemotherapy  Today you received the following chemotherapy agents: docetaxel and cyclophosphamide.  To help prevent nausea and vomiting after your treatment, we encourage you to take your nausea medication {s directed.   If you develop nausea and vomiting that is not controlled by your nausea medication, call the clinic.   BELOW ARE SYMPTOMS THAT SHOULD BE REPORTED IMMEDIATELY:  *FEVER GREATER THAN 100.5 F  *CHILLS WITH OR WITHOUT FEVER  NAUSEA AND VOMITING THAT IS NOT CONTROLLED WITH YOUR NAUSEA MEDICATION  *UNUSUAL SHORTNESS OF BREATH  *UNUSUAL BRUISING OR BLEEDING  TENDERNESS IN MOUTH AND THROAT WITH OR WITHOUT PRESENCE OF ULCERS  *URINARY PROBLEMS  *BOWEL PROBLEMS  UNUSUAL RASH Items with * indicate a potential emergency and should be followed up as soon as possible.  Feel free to call the clinic should you have any questions or concerns. The clinic phone number is [phone].  Please show the [facility] at check-in to the Emergency Department and triage nurse.

## 2019-12-16 ENCOUNTER — Ambulatory Visit: Payer: No Typology Code available for payment source

## 2019-12-19 NOTE — Progress Notes (Addendum)
Radiation Oncology         [phone] ________________________________  Name: Wendy [name] MRN: [id number]  Date: 12/14/2019  DOB: Jan 19, 1956  MG:NOIBBC, Ranell Patrick, MD  Nicholas Lose, MD     REFERRING PHYSICIAN: Nicholas Lose, MD   DIAGNOSIS: The encounter diagnosis was Malignant neoplasm of upper-outer quadrant of right breast in female, estrogen receptor positive ([place]).   HISTORY OF PRESENT ILLNESS::Wendy [name] is a 64 y.o. female who is seen for an initial consultation visit regarding the patient's diagnosis of right-sided breast cancer.  The patient initially had a lumpectomy in December 2020 which revealed a grade 3 invasive ductal carcinoma with associated DCIS.  The tumor was at least 2-1/2 cm and 1 out of 3 lymph nodes was positive for carcinoma.  The tumor was ER positive, PR positive, and HER-2/neu negative.  The Ki-67 staining was 20%.  There were concerns with margins and therefore the patient underwent reexcision, ultimately 2 additional reexcisions but concerns regarding the margins persisted.  The patient therefore proceeded with a right-sided mastectomy and residual invasive ductal carcinoma was seen at that time as well.  1 out of 5 lymph nodes also returned positive from this final resection.  Final margins were negative.  The patient had a MammaPrint test which returned as high risk and therefore the patient has proceeded with chemotherapy.  She also is scheduled for additional plastic surgery on 01/02/2020 corresponding to an exchange of the expander.  Given this presentation, I have been asked to see the patient for consideration of adjuvant radiation treatment.    PREVIOUS RADIATION THERAPY: No   PAST MEDICAL HISTORY:  has a past medical history of Breast cancer ([place]), Colon cancer ([place]), DDD (degenerative disc disease), cervical, DDD (degenerative disc disease), lumbar, ESOPHAGITIS, REFLUX, Glaucoma, HELICOBACTER PYLORI INFECTION, HEMORRHOIDS, INTERNAL, History  of colon cancer, History of colon polyps (2019), Hyperlipidemia, Radius fracture (2006), Thoracic spondylosis, and Tinnitus, bilateral (2013).     PAST SURGICAL HISTORY: Past Surgical History:  Procedure Laterality Date  . BREAST LUMPECTOMY WITH RADIOACTIVE SEED AND SENTINEL LYMPH NODE BIOPSY Right 06/27/2019   Procedure: RIGHT BREAST LUMPECTOMY WITH RADIOACTIVE SEED AND SENTINEL LYMPH NODE BIOPSY AND RIGHT TARGETED NODE DISSECTION;  Surgeon: Jovita Kussmaul, MD;  Location: [facility];  Service: General;  Laterality: Right;  . BREAST RECONSTRUCTION WITH PLACEMENT OF TISSUE EXPANDER AND FLEX HD (ACELLULAR HYDRATED DERMIS) Right 09/08/2019   Procedure: BREAST RECONSTRUCTION WITH PLACEMENT OF TISSUE EXPANDER AND FLEX HD (ACELLULAR HYDRATED DERMIS);  Surgeon: Wallace Going, DO;  Location: [facility];  Service: Plastics;  Laterality: Right;  . CESAREAN SECTION  1989  . COLONOSCOPY  2019  . HYSTEROSCOPY WITH D & C N/A 09/17/2018   Procedure: DILATATION AND CURETTAGE Marlene Bast;  Surgeon: Brien Few, MD;  Location: [facility];  Service: Gynecology;  Laterality: N/A;  . PORTACATH PLACEMENT Left 08/01/2019   Procedure: INSERTION PORT-A-CATH;  Surgeon: Jovita Kussmaul, MD;  Location: [facility];  Service: General;  Laterality: Left;  . RE-EXCISION OF BREAST CANCER,SUPERIOR MARGINS Right 08/12/2019   Procedure: RE-EXCISION OF RIGHT BREAST,SUPERIOR MARGINS;  Surgeon: Jovita Kussmaul, MD;  Location: [facility];  Service: General;  Laterality: Right;  . RE-EXCISION OF BREAST LUMPECTOMY Right 08/01/2019   Procedure: RE-EXCISION OF RIGHT BREAST CANCER  MARGINS;  Surgeon: Jovita Kussmaul, MD;  Location: [facility];  Service: General;  Laterality: Right;  . SIMPLE MASTECTOMY WITH AXILLARY SENTINEL  NODE BIOPSY Right 09/08/2019   Procedure: RIGHT BREAST MASTECTOMY;  Surgeon: Jovita Kussmaul, MD;  Location: [facility];  Service: General;  Laterality: Right;  . TONSILLECTOMY     age 32 years old  . WRIST FRACTURE SURGERY  2006     FAMILY HISTORY: family history includes Brain cancer in her mother; Brain cancer (age of onset: 34) in her niece; Breast cancer in her mother and another family member; Cancer in her father and mother; Colon cancer (age of onset: 48) in her maternal grandmother; Dementia in her paternal grandfather; Diabetes in her brother, father, mother, and sister; Glaucoma in her sister; Heart disease in her brother and father; Hypertension in her brother, father, and sister; Lung cancer in her father; Other in her maternal uncle; Other (age of onset: 21) in her son.   SOCIAL HISTORY:  reports that she has never smoked. She has never used smokeless tobacco. She reports current alcohol use. She reports that she does not use drugs.   ALLERGIES: Hydrocodone-acetaminophen, Oxycodone, Other, Vicodin [hydrocodone-acetaminophen], and Brimonidine   MEDICATIONS:  Current Outpatient Medications  Medication Sig Dispense Refill  . acetaminophen-codeine (TYLENOL #3) 300-30 MG tablet Take 1-2 tablets by mouth every 4 (four) hours as needed for moderate pain. 40 tablet 0  . carisoprodol-aspirin-codeine (SOMA COMPOUND WITH CODEINE) 200-325-16 MG TABS tablet Take 1 tablet by mouth 4 (four) times daily as needed. 30 tablet 0  . diazepam (VALIUM) 2 MG tablet Take 1 tablet (2 mg total) by mouth every 12 (twelve) hours as needed for muscle spasms. 20 tablet 0  . dorzolamide-timolol (COSOPT) 22.3-6.8 MG/ML ophthalmic solution PLACE 1 DROP INTO BOTH EYES 2 (TWO) TIMES DAILY.    . fluconazole (DIFLUCAN) 100 MG tablet Take 1 tablet (100 mg total) by mouth daily. 14 tablet 0  . gabapentin (NEURONTIN) 100 MG capsule Take 100 mg by mouth 3 (three) times daily.    Marland Kitchen latanoprost (XALATAN) 0.005 % ophthalmic solution 1 drop at bedtime.    . lidocaine-prilocaine (EMLA) cream Apply to affected area once 30 g 3   . LORazepam (ATIVAN) 0.5 MG tablet Take 1 tablet (0.5 mg total) by mouth at bedtime as needed for sleep. 30 tablet 0  . magic mouthwash w/lidocaine SOLN Take 5 mLs by mouth 4 (four) times daily as needed for mouth pain. 240 mL 2  . ondansetron (ZOFRAN) 8 MG tablet Take 1 tablet (8 mg total) by mouth 2 (two) times daily as needed for refractory nausea / vomiting. Start on day 3 after chemo. 30 tablet 1  . prochlorperazine (COMPAZINE) 10 MG tablet Take 1 tablet (10 mg total) by mouth every 6 (six) hours as needed (Nausea or vomiting). 30 tablet 1  . venlafaxine XR (EFFEXOR-XR) 37.5 MG 24 hr capsule TAKE 1 CAPSULE BY MOUTH EVERY DAY WITH BREAKFAST. MAY INCREASE TO 2 CAPS DAILY IF NEEDED 90 capsule 2   No current facility-administered medications for this encounter.   Facility-Administered Medications Ordered in Other Encounters  Medication Dose Route Frequency Provider Last Rate Last Admin  . heparin lock flush 100 unit/mL  500 Units Intravenous Once Nicholas Lose, MD      . sodium chloride flush (NS) 0.9 % injection 10 mL  10 mL Intravenous PRN Nicholas Lose, MD   10 mL at 10/14/19 1032     REVIEW OF SYSTEMS:  A 15 point review of systems is documented in the electronic medical record. This was obtained by the nursing staff. However, I reviewed  this with the patient to discuss relevant findings and make appropriate changes.  Pertinent items are noted in HPI.    PHYSICAL EXAM:  height is '5\' 3"'$  (1.6 m) and weight is 212 lb (96.2 kg).   ECOG = 1  0 - Asymptomatic (Fully active, able to carry on all predisease activities without restriction)  1 - Symptomatic but completely ambulatory (Restricted in physically strenuous activity but ambulatory and able to carry out work of a light or sedentary nature. For example, light housework, office work)  2 - Symptomatic, <50% in bed during the day (Ambulatory and capable of all self care but unable to carry out any work activities. Up and about more than  50% of waking hours)  3 - Symptomatic, >50% in bed, but not bedbound (Capable of only limited self-care, confined to bed or chair 50% or more of waking hours)  4 - Bedbound (Completely disabled. Cannot carry on any self-care. Totally confined to bed or chair)  5 - Death   Eustace Pen MM, Creech RH, Tormey DC, et al. [phone]). "Toxicity and response criteria of the [facility]". [place] Oncol. 5 (6): 649-55     LABORATORY DATA:  Lab Results  Component Value Date   WBC 4.6 12/15/2019   HGB 12.1 12/15/2019   HCT 38.5 12/15/2019   MCV 89.5 12/15/2019   PLT 233 12/15/2019   Lab Results  Component Value Date   NA 140 12/15/2019   K 3.7 12/15/2019   CL 111 12/15/2019   CO2 23 12/15/2019   Lab Results  Component Value Date   ALT 11 12/15/2019   AST 16 12/15/2019   ALKPHOS 58 12/15/2019   BILITOT 0.5 12/15/2019      RADIOGRAPHY: No results found.     IMPRESSION/ PLAN:  The patient is ultimately status post mastectomy for a right-sided breast cancer, at least T2N1.  Original tumor size was at least 2.5 cm on an initial lumpectomy and subsequent resections did reveal residual invasive ductal carcinoma.  2 lymph nodes also were positive.  On the basis of this I believe that the patient is a good candidate for postmastectomy radiation treatment.  We discussed the rationale of such a treatment including the possible benefits as well as side effects and risks.  All of the patient's questions were answered and she does wish to proceed with this at the appropriate time.  The patient is undergoing additional plastic surgery on 01/02/2020 with exchange of expander.  We therefore discussed needing for her to heal after this procedure prior to beginning radiation treatment.  We will tentatively plan to proceed with simulation in mid to late July.  The patient was encouraged to contact her office if we can be of any further assistance prior to seeing her at this time for  treatment planning.   Due to the coronavirus pandemic, this encounter was provided bytelephone.  The patient has given verbal consent for this type of encounter and has been advised to only accept a meeting of this type in a secure network environment. The time spent during this encounter was 45 minutes including review of medical documentation, discussion of the patient's case with her over the telephone, and coordination of care. The attendants for this meeting include  Dr. Lisbeth Renshaw and the patient.  During the encounter,  Dr. Lisbeth Renshaw, and was located at [facility] Radiation Oncology Department.  The patient was located at home.        ________________________________  Jodelle Gross, MD, PhD   **Disclaimer: This note was dictated with voice recognition software. Similar sounding words can inadvertently be transcribed and this note may contain transcription errors which may not have been corrected upon publication of note.**

## 2019-12-19 NOTE — Addendum Note (Signed)
Encounter addended by: Kyung Rudd, MD on: 12/19/2019 10:54 AM  Actions taken: Clinical Note Signed

## 2019-12-23 ENCOUNTER — Inpatient Hospital Stay (HOSPITAL_BASED_OUTPATIENT_CLINIC_OR_DEPARTMENT_OTHER): Payer: No Typology Code available for payment source | Admitting: Adult Health

## 2019-12-23 ENCOUNTER — Telehealth: Payer: Self-pay | Admitting: *Deleted

## 2019-12-23 DIAGNOSIS — C50411 Malignant neoplasm of upper-outer quadrant of right female breast: Secondary | ICD-10-CM | POA: Diagnosis not present

## 2019-12-23 DIAGNOSIS — Z170 Estrogen receptor positive status [ER+]: Secondary | ICD-10-CM | POA: Diagnosis not present

## 2019-12-23 MED ORDER — PREDNISONE 10 MG PO TABS: 10.0000 mg | ORAL_TABLET | ORAL | 0 refills | Status: DC

## 2019-12-23 MED ORDER — TRIAMCINOLONE ACETONIDE 0.5 % EX OINT
1.0000 | TOPICAL_OINTMENT | Freq: Two times a day (BID) | CUTANEOUS | 0 refills | Status: DC
Start: 2019-12-23 — End: 2020-04-23

## 2019-12-23 NOTE — Assessment & Plan Note (Addendum)
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  Treatment plan: 1.Adjuvant chemotherapy withTaxotere and Cytoxan every 3 weeks x4 2.Adjuvant radiation therapy 3.Follow-up adjuvant antiestrogen therapywith anastrozole 1 mg daily x7 years Genetic testing ------------------------------------------------------------------------------------------------------ Current treatment: Cycle4Taxotere and Cytoxan completed  For the skin rash I sent in a prednisone taper along with kenalog cream.  She will call us if that does not help the rash.  I suggested she take Pepcid as well.    This is likely secondary to chemo.  Should she worsen she should have in person evaluation at urgent care, as she may require a steroid injection. She knows to go into urgent care if needed over the weekend.

## 2019-12-23 NOTE — Progress Notes (Signed)
Wendy Cancer Follow up:    Wendy Agreste, MD [place] [address]   DIAGNOSIS: Cancer Staging Malignant neoplasm of upper-outer quadrant of right breast in female, estrogen receptor positive ([place]) Staging form: Breast, AJCC 8th Edition - Clinical stage from 06/01/2019: Stage IIA (cT1c, cN1, cM0, G3, ER+, PR+, HER2-) - Signed by Nicholas Lose, MD on 06/01/2019 - Pathologic stage from 07/11/2019: Stage IIA (pT2, pN1a, cM0, G3, ER+, PR+, HER2-) - Signed by Nicholas Lose, MD on 07/11/2019  I connected with [name] on 12/23/19 at  3:45 PM EDT by my chart video and verified that I am speaking with the correct person using two identifiers.  I discussed the limitations, risks, security and privacy concerns of performing an evaluation and management service by telephone and the availability of in person appointments.  I also discussed with the patient that there may be a patient responsible charge related to this service. The patient expressed understanding and agreed to proceed.    SUMMARY OF ONCOLOGIC HISTORY: Oncology History  Malignant neoplasm of upper-outer quadrant of right breast in female, estrogen receptor positive ([place])  05/20/2019 Initial Diagnosis   Palpable right breast mass superficial, ultrasound 11 o'clock position 1.7 cm: Biopsy revealed grade 3 IDC with DCIS ER 100%, PR 30%, Ki-67 20%, HER-2 by IHC negative, axilla lymph node biopsy positive   06/01/2019 Cancer Staging   Staging form: Breast, AJCC 8th Edition - Clinical stage from 06/01/2019: Stage IIA (cT1c, cN1, cM0, G3, ER+, PR+, HER2-) - Signed by Nicholas Lose, MD on 06/01/2019   06/27/2019 Surgery   Right lumpectomy [name]): IDC, grade 3, at least 2.5cm, involving a complex sclerosing lesion, 1.1cm, with high grade DCIS, involved margins, 1/3 right axillary lymph nodes positive, 1.4cm.    07/11/2019 Cancer Staging   Staging form: Breast, AJCC 8th Edition - Pathologic stage from  07/11/2019: Stage IIA (pT2, pN1a, cM0, G3, ER+, PR+, HER2-) - Signed by Nicholas Lose, MD on 07/11/2019   07/19/2019 Oncotype testing   Mammaprint: high risk, 29% chance of recurrence in 10 years without systemic treatment.    09/08/2019 Surgery   Re-excision x2 (08/01/19 and 08/12/19) followed by right mastectomy [name]): residual carcinoma and DCIS, 1/5 lymph nodes positive, clear margins.    10/14/2019 -  Chemotherapy   The patient had dexamethasone (DECADRON) 4 MG tablet, 4 mg (100 % of original dose 4 mg), Oral, Daily, 1 of 1 cycle, Start date: 07/22/2019, End date: 11/24/2019 Dose modification: 4 mg (original dose 4 mg, Cycle 0) palonosetron (ALOXI) injection 0.25 mg, 0.25 mg, Intravenous,  Once, 4 of 4 cycles Administration: 0.25 mg (10/14/2019), 0.25 mg (11/04/2019), 0.25 mg (11/24/2019), 0.25 mg (12/15/2019) pegfilgrastim (NEULASTA ONPRO KIT) injection 6 mg, 6 mg, Subcutaneous, Once, 4 of 4 cycles Administration: 6 mg (10/14/2019), 6 mg (11/04/2019), 6 mg (11/24/2019), 6 mg (12/15/2019) cyclophosphamide (CYTOXAN) 1,260 mg in sodium chloride 0.9 % 250 mL chemo infusion, 600 mg/m2 = 1,260 mg, Intravenous,  Once, 4 of 4 cycles Dose modification: 500 mg/m2 (original dose 600 mg/m2, Cycle 2, Reason: Dose not tolerated) Administration: 1,260 mg (10/14/2019), 1,040 mg (11/04/2019), 1,040 mg (11/24/2019), 1,040 mg (12/15/2019) DOCEtaxel (TAXOTERE) 160 mg in sodium chloride 0.9 % 250 mL chemo infusion, 75 mg/m2 = 160 mg, Intravenous,  Once, 4 of 4 cycles Dose modification: 65 mg/m2 (original dose 75 mg/m2, Cycle 2, Reason: Dose not tolerated) Administration: 160 mg (10/14/2019), 140 mg (11/04/2019), 140 mg (11/24/2019), 140 mg (12/15/2019)  for chemotherapy treatment.  CURRENT THERAPY: recent completion of four cycles of taxotere/cytoxan last week  INTERVAL HISTORY: Wendy [name] 64 y.o. female is here for a my chart video visit of her skin rash that started this morning.  She hsa been breaking out with red lesions  on her arms, starting this morning and now on her neck.  She has tried several anti histamines without relief.  She wants to know what to do about it.  No recent changes in detergent, lotions, washes, etc.     Patient Active Problem List   Diagnosis Date Noted  . Port-A-Cath in place 10/20/2019  . S/P mastectomy, right 10/19/2019  . Acquired absence of breast 09/16/2019  . Breast cancer ([place]) 09/08/2019  . Genetic testing 06/10/2019  . Malignant neoplasm of upper-outer quadrant of right breast in female, estrogen receptor positive ([place]) 06/01/2019  . Family history of breast cancer 06/01/2019  . Family history of colon cancer 06/01/2019  . Family history of brain cancer 06/01/2019  . History of colon cancer   . Abnormal colonoscopy 10/30/2017  . Chronic glaucoma 08/11/2013  . HELICOBACTER PYLORI INFECTION 08/27/2007  . GERD 08/27/2007  . ESOPHAGITIS, REFLUX 05/17/2007  . HEMORRHOIDS, INTERNAL 09/22/2003    is allergic to hydrocodone-acetaminophen, oxycodone, other, vicodin [hydrocodone-acetaminophen], and brimonidine.  MEDICAL HISTORY: Past Medical History:  Diagnosis Date  . Breast cancer ([place])   . Colon cancer ([place])   . DDD (degenerative disc disease), cervical   . DDD (degenerative disc disease), lumbar   . ESOPHAGITIS, REFLUX   . Glaucoma   . HELICOBACTER PYLORI INFECTION   . HEMORRHOIDS, INTERNAL   . History of colon cancer   . History of colon polyps 2019  . Hyperlipidemia   . Radius fracture 2006   Left, Distal  . Thoracic spondylosis   . Tinnitus, bilateral 2013    SURGICAL HISTORY: Past Surgical History:  Procedure Laterality Date  . BREAST LUMPECTOMY WITH RADIOACTIVE SEED AND SENTINEL LYMPH NODE BIOPSY Right 06/27/2019   Procedure: RIGHT BREAST LUMPECTOMY WITH RADIOACTIVE SEED AND SENTINEL LYMPH NODE BIOPSY AND RIGHT TARGETED NODE DISSECTION;  Surgeon: Jovita Kussmaul, MD;  Location: [facility];  Service: General;  Laterality: Right;  . BREAST  RECONSTRUCTION WITH PLACEMENT OF TISSUE EXPANDER AND FLEX HD (ACELLULAR HYDRATED DERMIS) Right 09/08/2019   Procedure: BREAST RECONSTRUCTION WITH PLACEMENT OF TISSUE EXPANDER AND FLEX HD (ACELLULAR HYDRATED DERMIS);  Surgeon: Wallace Going, DO;  Location: [facility];  Service: Plastics;  Laterality: Right;  . CESAREAN SECTION  1989  . COLONOSCOPY  2019  . HYSTEROSCOPY WITH D & C N/A 09/17/2018   Procedure: DILATATION AND CURETTAGE Marlene Bast;  Surgeon: Brien Few, MD;  Location: [facility];  Service: Gynecology;  Laterality: N/A;  . PORTACATH PLACEMENT Left 08/01/2019   Procedure: INSERTION PORT-A-CATH;  Surgeon: Jovita Kussmaul, MD;  Location: [facility];  Service: General;  Laterality: Left;  . RE-EXCISION OF BREAST CANCER,SUPERIOR MARGINS Right 08/12/2019   Procedure: RE-EXCISION OF RIGHT BREAST,SUPERIOR MARGINS;  Surgeon: Jovita Kussmaul, MD;  Location: [facility];  Service: General;  Laterality: Right;  . RE-EXCISION OF BREAST LUMPECTOMY Right 08/01/2019   Procedure: RE-EXCISION OF RIGHT BREAST CANCER  MARGINS;  Surgeon: Jovita Kussmaul, MD;  Location: [facility];  Service: General;  Laterality: Right;  . SIMPLE MASTECTOMY WITH AXILLARY SENTINEL NODE BIOPSY Right 09/08/2019   Procedure: RIGHT BREAST MASTECTOMY;  Surgeon: Jovita Kussmaul, MD;  Location: [facility];  Service: General;  Laterality: Right;  . TONSILLECTOMY     age 89 years old  . WRIST FRACTURE SURGERY  2006    SOCIAL HISTORY: Social History   Socioeconomic History  . Marital status: Significant Other    Spouse name: Not on file  . Number of children: Not on file  . Years of education: Not on file  . Highest education level: Not on file  Occupational History  . Occupation: medical    Employer: [employer]  Tobacco Use  . Smoking status: Never Smoker  . Smokeless tobacco: Never Used  Substance and  Sexual Activity  . Alcohol use: Yes    Comment: occ  . Drug use: No  . Sexual activity: Not Currently  Other Topics Concern  . Not on file  Social History Narrative  . Not on file   Social Determinants of Health   Financial Resource Strain:   . Difficulty of Paying Living Expenses:   Food Insecurity:   . Worried About Charity fundraiser in the Last Year:   . Arboriculturist in the Last Year:   Transportation Needs:   . Film/video editor (Medical):   Marland Kitchen Lack of Transportation (Non-Medical):   Physical Activity:   . Days of Exercise per Week:   . Minutes of Exercise per Session:   Stress:   . Feeling of Stress :   Social Connections:   . Frequency of Communication with Friends and Family:   . Frequency of Social Gatherings with Friends and Family:   . Attends Religious Services:   . Active Member of Clubs or Organizations:   . Attends Archivist Meetings:   Marland Kitchen Marital Status:   Intimate Partner Violence:   . Fear of Current or Ex-Partner:   . Emotionally Abused:   Marland Kitchen Physically Abused:   . Sexually Abused:     FAMILY HISTORY: Family History  Problem Relation Age of Onset  . Diabetes Mother   . Brain cancer Mother        mets  . Cancer Mother        breast-right  . Breast cancer Mother        d. 78  . Diabetes Father   . Hypertension Father   . Cancer Father        lung  . Heart disease Father   . Lung cancer Father   . Diabetes Sister   . Hypertension Sister   . Glaucoma Sister   . Diabetes Brother   . Hypertension Brother   . Heart disease Brother        congestive heart failure  . Other Son 16       car accident  . Colon cancer Maternal Grandmother 75       d. 36  . Dementia Paternal Grandfather   . Breast cancer Other        MGMs mother  . Other Maternal Uncle        possibly drowning  . Brain cancer Niece 1       d. 2 years    Review of Systems  Constitutional: Positive for fatigue. Negative for appetite change, chills, fever  and unexpected weight change.  HENT:   Negative for lump/mass.   Eyes: Negative for eye problems and icterus.  Respiratory: Negative for chest tightness, cough and shortness of breath.   Gastrointestinal: Negative for abdominal distention and abdominal pain.  Endocrine: Negative for hot flashes.  Musculoskeletal: Negative for arthralgias.  Skin: Positive for itching and rash.  Neurological: Negative for dizziness, extremity weakness, headaches and numbness.  Hematological: Negative for adenopathy. Does not bruise/bleed easily.  Psychiatric/Behavioral: Negative for depression. The patient is not nervous/anxious.       PHYSICAL EXAMINATION  ECOG PERFORMANCE STATUS: 1 - Symptomatic but completely ambulatory    Physical Exam Constitutional:      General: She is not in acute distress.    Appearance: Normal appearance. She is not toxic-appearing.  Pulmonary:     Effort: Pulmonary effort is normal.  Skin:    Findings: Rash (erythematous maculopapular rash noted on arms bilaterally) present.  Neurological:     General: No focal deficit present.     Mental Status: She is alert.  Psychiatric:        Mood and Affect: Mood normal.        Behavior: Behavior normal.     LABORATORY DATA:  CBC    Component Value Date/Time   WBC 4.6 12/15/2019 0951   WBC 6.3 09/17/2018 1100   RBC 4.30 12/15/2019 0951   HGB 12.1 12/15/2019 0951   HGB 15.3 08/31/2017 1146   HCT 38.5 12/15/2019 0951   HCT 45.7 08/31/2017 1146   PLT 233 12/15/2019 0951   PLT 208 08/31/2017 1146   MCV 89.5 12/15/2019 0951   MCV 84 08/31/2017 1146   MCH 28.1 12/15/2019 0951   MCHC 31.4 12/15/2019 0951   RDW 17.0 (H) 12/15/2019 0951   RDW 14.2 08/31/2017 1146   LYMPHSABS 1.4 12/15/2019 0951   MONOABS 0.9 12/15/2019 0951   EOSABS 0.0 12/15/2019 0951   BASOSABS 0.0 12/15/2019 0951    CMP     Component Value Date/Time   NA 140 12/15/2019 0951   NA 142 04/30/2018 1708   K 3.7 12/15/2019 0951   CL 111  12/15/2019 0951   CO2 23 12/15/2019 0951   GLUCOSE 121 (H) 12/15/2019 0951   BUN 7 (L) 12/15/2019 0951   BUN 9 04/30/2018 1708   CREATININE 0.65 12/15/2019 0951   CREATININE 0.70 03/04/2013 1303   CALCIUM 8.6 (L) 12/15/2019 0951   PROT 5.8 (L) 12/15/2019 0951   PROT 7.0 04/30/2018 1708   ALBUMIN 3.2 (L) 12/15/2019 0951   ALBUMIN 4.2 04/30/2018 1708   AST 16 12/15/2019 0951   ALT 11 12/15/2019 0951   ALKPHOS 58 12/15/2019 0951   BILITOT 0.5 12/15/2019 0951   GFRNONAA >60 12/15/2019 0951   GFRAA >60 12/15/2019 0951        ASSESSMENT and PLAN:   Malignant neoplasm of upper-outer quadrant of right breast in female, estrogen receptor positive ([place]) 06/27/2019:Right lumpectomy [name]): IDC, grade 3, at least 2.5cm, involving a complex sclerosing lesion, 1.1cm, with high grade DCIS, involved margins, 1/3 right axillary lymph nodes positive, 1.4cm.ER 100%, PR 30%, Ki-67 20%, HER-2 IHC negative T2 N1 stage IIA MammaPrint: High risk, 10-year risk of recurrence untreated: 29% 09/08/2019:Re-excision x2 (08/01/19 and 08/12/19) followed by right mastectomy [name]): residual carcinoma and DCIS, 1/5 lymph nodes positive, clear margins.  Treatment plan: 1.Adjuvant chemotherapy withTaxotere and Cytoxan every 3 weeks x4 2.Adjuvant radiation therapy 3.Follow-up adjuvant antiestrogen therapywith anastrozole 1 mg daily x7 years Genetic testing ------------------------------------------------------------------------------------------------------ Current treatment: Cycle4Taxotere and Cytoxan completed  For the skin rash I sent in a prednisone taper along with kenalog cream.  She will call us if that does not help the rash.  I suggested she take Pepcid as well.    This is likely secondary to  chemo.  Should she worsen she should have in person evaluation at urgent care, as she may require a steroid injection. She knows to go into urgent care if needed over the weekend.      All questions  were answered. The patient knows to call the clinic with any problems, questions or concerns. We can certainly see the patient much sooner if necessary.  Total encounter time: 20 minutes*  Wilber Bihari, NP 12/23/19 4:04 PM Medical Oncology and Hematology [facility], [place], [place] [address] Tel. [phone]    Fax. [phone]  *Total Encounter Time as defined by the Centers for Medicare and Medicaid Services includes, in addition to the face-to-face time of a patient visit (documented in the note above) non-face-to-face time: obtaining and reviewing outside history, ordering and reviewing medications, tests or procedures, care coordination (communications with other health care professionals or caregivers) and documentation in the medical record.

## 2019-12-23 NOTE — Telephone Encounter (Signed)
Received call from pt with complaint of rash bilateral arms unrelieved by p.o benadryl or cortisone cream.  Per Wilber Bihari, NP pt needing mychart video visit today to assess rash.  Apt scheduled and pt verbalized understanding.

## 2019-12-26 ENCOUNTER — Encounter (HOSPITAL_BASED_OUTPATIENT_CLINIC_OR_DEPARTMENT_OTHER): Payer: Self-pay | Admitting: Plastic Surgery

## 2019-12-26 ENCOUNTER — Other Ambulatory Visit: Payer: Self-pay

## 2019-12-29 ENCOUNTER — Other Ambulatory Visit (HOSPITAL_COMMUNITY)
Admission: RE | Admit: 2019-12-29 | Discharge: 2019-12-29 | Disposition: A | Discharge status: Home / Self Care | Payer: No Typology Code available for payment source | Source: Ambulatory Visit | Attending: Plastic Surgery | Admitting: Plastic Surgery

## 2019-12-29 DIAGNOSIS — Z20822 Contact with and (suspected) exposure to covid-19: Secondary | ICD-10-CM | POA: Insufficient documentation

## 2019-12-29 DIAGNOSIS — Z01812 Encounter for preprocedural laboratory examination: Secondary | ICD-10-CM | POA: Insufficient documentation

## 2019-12-29 LAB — SARS CORONAVIRUS 2 (TAT 6-24 HRS): SARS Coronavirus 2: NEGATIVE

## 2020-01-02 ENCOUNTER — Ambulatory Visit (HOSPITAL_BASED_OUTPATIENT_CLINIC_OR_DEPARTMENT_OTHER): Payer: No Typology Code available for payment source | Admitting: Certified Registered"

## 2020-01-02 ENCOUNTER — Encounter (HOSPITAL_BASED_OUTPATIENT_CLINIC_OR_DEPARTMENT_OTHER): Payer: Self-pay | Admitting: Plastic Surgery

## 2020-01-02 ENCOUNTER — Other Ambulatory Visit: Payer: Self-pay | Admitting: Surgical

## 2020-01-02 ENCOUNTER — Encounter (HOSPITAL_BASED_OUTPATIENT_CLINIC_OR_DEPARTMENT_OTHER)
Admission: RE | Disposition: A | Discharge status: Home / Self Care | Payer: Self-pay | Source: Home / Self Care | Attending: Plastic Surgery

## 2020-01-02 ENCOUNTER — Ambulatory Visit (HOSPITAL_BASED_OUTPATIENT_CLINIC_OR_DEPARTMENT_OTHER)
Admission: RE | Admit: 2020-01-02 | Discharge: 2020-01-02 | Disposition: A | Discharge status: Home / Self Care | Payer: No Typology Code available for payment source | Attending: Plastic Surgery | Admitting: Plastic Surgery

## 2020-01-02 ENCOUNTER — Other Ambulatory Visit: Payer: Self-pay

## 2020-01-02 DIAGNOSIS — Z803 Family history of malignant neoplasm of breast: Secondary | ICD-10-CM | POA: Diagnosis not present

## 2020-01-02 DIAGNOSIS — M5136 Other intervertebral disc degeneration, lumbar region: Secondary | ICD-10-CM | POA: Diagnosis not present

## 2020-01-02 DIAGNOSIS — K219 Gastro-esophageal reflux disease without esophagitis: Secondary | ICD-10-CM | POA: Insufficient documentation

## 2020-01-02 DIAGNOSIS — Z808 Family history of malignant neoplasm of other organs or systems: Secondary | ICD-10-CM | POA: Diagnosis not present

## 2020-01-02 DIAGNOSIS — Z6837 Body mass index (BMI) 37.0-37.9, adult: Secondary | ICD-10-CM | POA: Diagnosis not present

## 2020-01-02 DIAGNOSIS — Z885 Allergy status to narcotic agent status: Secondary | ICD-10-CM | POA: Insufficient documentation

## 2020-01-02 DIAGNOSIS — Z86010 Personal history of colonic polyps: Secondary | ICD-10-CM | POA: Insufficient documentation

## 2020-01-02 DIAGNOSIS — Z833 Family history of diabetes mellitus: Secondary | ICD-10-CM | POA: Insufficient documentation

## 2020-01-02 DIAGNOSIS — Z9221 Personal history of antineoplastic chemotherapy: Secondary | ICD-10-CM | POA: Diagnosis not present

## 2020-01-02 DIAGNOSIS — F419 Anxiety disorder, unspecified: Secondary | ICD-10-CM | POA: Insufficient documentation

## 2020-01-02 DIAGNOSIS — Z853 Personal history of malignant neoplasm of breast: Secondary | ICD-10-CM | POA: Insufficient documentation

## 2020-01-02 DIAGNOSIS — E785 Hyperlipidemia, unspecified: Secondary | ICD-10-CM | POA: Insufficient documentation

## 2020-01-02 DIAGNOSIS — H9319 Tinnitus, unspecified ear: Secondary | ICD-10-CM | POA: Insufficient documentation

## 2020-01-02 DIAGNOSIS — Z820 Family history of epilepsy and other diseases of the nervous system: Secondary | ICD-10-CM | POA: Insufficient documentation

## 2020-01-02 DIAGNOSIS — Z888 Allergy status to other drugs, medicaments and biological substances status: Secondary | ICD-10-CM | POA: Diagnosis not present

## 2020-01-02 DIAGNOSIS — M5030 Other cervical disc degeneration, unspecified cervical region: Secondary | ICD-10-CM | POA: Diagnosis not present

## 2020-01-02 DIAGNOSIS — N651 Disproportion of reconstructed breast: Secondary | ICD-10-CM | POA: Insufficient documentation

## 2020-01-02 DIAGNOSIS — Z801 Family history of malignant neoplasm of trachea, bronchus and lung: Secondary | ICD-10-CM | POA: Insufficient documentation

## 2020-01-02 DIAGNOSIS — M1990 Unspecified osteoarthritis, unspecified site: Secondary | ICD-10-CM | POA: Diagnosis not present

## 2020-01-02 DIAGNOSIS — E669 Obesity, unspecified: Secondary | ICD-10-CM | POA: Diagnosis not present

## 2020-01-02 DIAGNOSIS — Z79899 Other long term (current) drug therapy: Secondary | ICD-10-CM | POA: Insufficient documentation

## 2020-01-02 DIAGNOSIS — H409 Unspecified glaucoma: Secondary | ICD-10-CM | POA: Diagnosis not present

## 2020-01-02 DIAGNOSIS — Z170 Estrogen receptor positive status [ER+]: Secondary | ICD-10-CM

## 2020-01-02 DIAGNOSIS — Z8249 Family history of ischemic heart disease and other diseases of the circulatory system: Secondary | ICD-10-CM | POA: Diagnosis not present

## 2020-01-02 DIAGNOSIS — Z421 Encounter for breast reconstruction following mastectomy: Secondary | ICD-10-CM | POA: Diagnosis present

## 2020-01-02 DIAGNOSIS — Z9011 Acquired absence of right breast and nipple: Secondary | ICD-10-CM | POA: Diagnosis not present

## 2020-01-02 DIAGNOSIS — Z85038 Personal history of other malignant neoplasm of large intestine: Secondary | ICD-10-CM | POA: Insufficient documentation

## 2020-01-02 DIAGNOSIS — C50411 Malignant neoplasm of upper-outer quadrant of right female breast: Secondary | ICD-10-CM

## 2020-01-02 HISTORY — PX: PORT-A-CATH REMOVAL: SHX5289

## 2020-01-02 HISTORY — PX: BREAST REDUCTION WITH MASTOPEXY: SHX6465

## 2020-01-02 HISTORY — PX: REMOVAL OF TISSUE EXPANDER AND PLACEMENT OF IMPLANT: SHX6457

## 2020-01-02 SURGERY — REMOVAL, TISSUE EXPANDER, BREAST, WITH IMPLANT INSERTION
Anesthesia: General | Site: Chest | Laterality: Right

## 2020-01-02 MED ORDER — SODIUM CHLORIDE 0.9% FLUSH: 3.0000 mL | INTRAVENOUS | Status: DC | PRN

## 2020-01-02 MED ORDER — PHENYLEPHRINE 40 MCG/ML (10ML) SYRINGE FOR IV PUSH (FOR BLOOD PRESSURE SUPPORT)
PREFILLED_SYRINGE | INTRAVENOUS | Status: AC
  Filled 2020-01-02: qty 30

## 2020-01-02 MED ORDER — HYDROMORPHONE HCL 1 MG/ML IJ SOLN: 0.2500 mg | INTRAMUSCULAR | Status: DC | PRN

## 2020-01-02 MED ORDER — CEFAZOLIN SODIUM-DEXTROSE 2-4 GM/100ML-% IV SOLN
2.0000 g | INTRAVENOUS | Status: AC
  Administered 2020-01-02: 2 g via INTRAVENOUS

## 2020-01-02 MED ORDER — LIDOCAINE-EPINEPHRINE 1 %-1:100000 IJ SOLN
INTRAMUSCULAR | Status: DC | PRN
  Administered 2020-01-02: 22 mL via INTRAMUSCULAR

## 2020-01-02 MED ORDER — ONDANSETRON HCL 4 MG/2ML IJ SOLN
INTRAMUSCULAR | Status: DC | PRN
  Administered 2020-01-02: 4 mg via INTRAVENOUS

## 2020-01-02 MED ORDER — KETOROLAC TROMETHAMINE 30 MG/ML IJ SOLN
15.0000 mg | Freq: Once | INTRAMUSCULAR | Status: AC
  Administered 2020-01-02: 15 mg via INTRAVENOUS

## 2020-01-02 MED ORDER — SODIUM CHLORIDE (PF) 0.9 % IJ SOLN
INTRAMUSCULAR | Status: AC
  Filled 2020-01-02: qty 40

## 2020-01-02 MED ORDER — ACETAMINOPHEN 10 MG/ML IV SOLN
1000.0000 mg | Freq: Once | INTRAVENOUS | Status: AC
  Administered 2020-01-02: 1000 mg via INTRAVENOUS

## 2020-01-02 MED ORDER — PHENYLEPHRINE HCL (PRESSORS) 10 MG/ML IV SOLN
INTRAVENOUS | Status: DC | PRN
Start: 2020-01-02 — End: 2020-01-02
  Administered 2020-01-02: 80 ug via INTRAVENOUS
  Administered 2020-01-02: 40 ug via INTRAVENOUS

## 2020-01-02 MED ORDER — CEPHALEXIN 500 MG PO CAPS: 500.0000 mg | ORAL_CAPSULE | Freq: Four times a day (QID) | ORAL | 0 refills | Status: AC

## 2020-01-02 MED ORDER — LACTATED RINGERS IV SOLN: INTRAVENOUS | Status: DC

## 2020-01-02 MED ORDER — EPHEDRINE 5 MG/ML INJ
INTRAVENOUS | Status: AC
  Filled 2020-01-02: qty 20

## 2020-01-02 MED ORDER — MEPERIDINE HCL 25 MG/ML IJ SOLN: 6.2500 mg | INTRAMUSCULAR | Status: DC | PRN

## 2020-01-02 MED ORDER — ONDANSETRON HCL 4 MG/2ML IJ SOLN
INTRAMUSCULAR | Status: AC
  Filled 2020-01-02: qty 8

## 2020-01-02 MED ORDER — KETOROLAC TROMETHAMINE 30 MG/ML IJ SOLN
INTRAMUSCULAR | Status: AC
  Filled 2020-01-02: qty 1

## 2020-01-02 MED ORDER — LIDOCAINE 2% (20 MG/ML) 5 ML SYRINGE
INTRAMUSCULAR | Status: AC
  Filled 2020-01-02: qty 10

## 2020-01-02 MED ORDER — FENTANYL CITRATE (PF) 100 MCG/2ML IJ SOLN: 25.0000 ug | INTRAMUSCULAR | Status: DC | PRN

## 2020-01-02 MED ORDER — PHENYLEPHRINE HCL-NACL 10-0.9 MG/250ML-% IV SOLN
INTRAVENOUS | Status: DC | PRN
Start: 2020-01-02 — End: 2020-01-02
  Administered 2020-01-02: 40 ug/min via INTRAVENOUS

## 2020-01-02 MED ORDER — LIDOCAINE-EPINEPHRINE 1 %-1:100000 IJ SOLN
INTRAMUSCULAR | Status: AC
  Filled 2020-01-02: qty 2

## 2020-01-02 MED ORDER — DEXAMETHASONE SODIUM PHOSPHATE 10 MG/ML IJ SOLN
INTRAMUSCULAR | Status: AC
  Filled 2020-01-02: qty 2

## 2020-01-02 MED ORDER — DEXAMETHASONE SODIUM PHOSPHATE 4 MG/ML IJ SOLN
INTRAMUSCULAR | Status: DC | PRN
  Administered 2020-01-02: 5 mg via INTRAVENOUS

## 2020-01-02 MED ORDER — MIDAZOLAM HCL 5 MG/5ML IJ SOLN
INTRAMUSCULAR | Status: DC | PRN
  Administered 2020-01-02: 2 mg via INTRAVENOUS

## 2020-01-02 MED ORDER — MIDAZOLAM HCL 2 MG/2ML IJ SOLN
INTRAMUSCULAR | Status: AC
  Filled 2020-01-02: qty 2

## 2020-01-02 MED ORDER — LIDOCAINE HCL (CARDIAC) PF 100 MG/5ML IV SOSY
PREFILLED_SYRINGE | INTRAVENOUS | Status: DC | PRN
  Administered 2020-01-02: 60 mg via INTRAVENOUS

## 2020-01-02 MED ORDER — PHENYLEPHRINE HCL (PRESSORS) 10 MG/ML IV SOLN
INTRAVENOUS | Status: AC
  Filled 2020-01-02: qty 2

## 2020-01-02 MED ORDER — BUPIVACAINE HCL (PF) 0.25 % IJ SOLN
INTRAMUSCULAR | Status: AC
  Filled 2020-01-02: qty 60

## 2020-01-02 MED ORDER — FENTANYL CITRATE (PF) 100 MCG/2ML IJ SOLN
INTRAMUSCULAR | Status: AC
  Filled 2020-01-02: qty 2

## 2020-01-02 MED ORDER — ACETAMINOPHEN 10 MG/ML IV SOLN
INTRAVENOUS | Status: AC
  Filled 2020-01-02: qty 100

## 2020-01-02 MED ORDER — PROPOFOL 10 MG/ML IV BOLUS
INTRAVENOUS | Status: DC | PRN
  Administered 2020-01-02: 200 mg via INTRAVENOUS

## 2020-01-02 MED ORDER — SODIUM CHLORIDE 0.9 % IV SOLN
INTRAVENOUS | Status: DC | PRN
  Administered 2020-01-02: 500 mL

## 2020-01-02 MED ORDER — EPHEDRINE SULFATE 50 MG/ML IJ SOLN
INTRAMUSCULAR | Status: DC | PRN
  Administered 2020-01-02 (×2): 10 mg via INTRAVENOUS

## 2020-01-02 MED ORDER — PROMETHAZINE HCL 25 MG/ML IJ SOLN: 6.2500 mg | INTRAMUSCULAR | Status: DC | PRN

## 2020-01-02 MED ORDER — CEFAZOLIN SODIUM-DEXTROSE 2-4 GM/100ML-% IV SOLN
INTRAVENOUS | Status: AC
  Filled 2020-01-02: qty 100

## 2020-01-02 MED ORDER — CHLORHEXIDINE GLUCONATE CLOTH 2 % EX PADS: 6.0000 | MEDICATED_PAD | Freq: Once | CUTANEOUS | Status: DC

## 2020-01-02 MED ORDER — FENTANYL CITRATE (PF) 100 MCG/2ML IJ SOLN
INTRAMUSCULAR | Status: DC | PRN
  Administered 2020-01-02: 25 ug via INTRAVENOUS
  Administered 2020-01-02: 50 ug via INTRAVENOUS
  Administered 2020-01-02 (×3): 25 ug via INTRAVENOUS

## 2020-01-02 MED ORDER — AMISULPRIDE (ANTIEMETIC) 5 MG/2ML IV SOLN: 10.0000 mg | Freq: Once | INTRAVENOUS | Status: DC | PRN

## 2020-01-02 MED ORDER — METHYLENE BLUE 0.5 % INJ SOLN
INTRAVENOUS | Status: AC
  Filled 2020-01-02: qty 10

## 2020-01-02 MED ORDER — PROPOFOL 500 MG/50ML IV EMUL
INTRAVENOUS | Status: DC | PRN
Start: 2020-01-02 — End: 2020-01-02
  Administered 2020-01-02: 25 ug/kg/min via INTRAVENOUS

## 2020-01-02 MED ORDER — SODIUM CHLORIDE 0.9% FLUSH: 3.0000 mL | Freq: Two times a day (BID) | INTRAVENOUS | Status: DC

## 2020-01-02 MED ORDER — SODIUM CHLORIDE 0.9 % IV SOLN
INTRAVENOUS | Status: AC
  Filled 2020-01-02: qty 500000

## 2020-01-02 MED ORDER — SODIUM CHLORIDE 0.9 % IV SOLN: 250.0000 mL | INTRAVENOUS | Status: DC | PRN

## 2020-01-02 SURGICAL SUPPLY — 50 items
BAG DECANTER FOR FLEXI CONT (MISCELLANEOUS) ×4 IMPLANT
BINDER BREAST XLRG (GAUZE/BANDAGES/DRESSINGS) ×4 IMPLANT
BLADE HEX COATED 2.75 (ELECTRODE) ×4 IMPLANT
BLADE SURG 10 STRL SS (BLADE) ×8 IMPLANT
BLADE SURG 15 STRL LF DISP TIS (BLADE) ×3 IMPLANT
BLADE SURG 15 STRL SS (BLADE) ×4
BNDG GAUZE ELAST 4 BULKY (GAUZE/BANDAGES/DRESSINGS) ×8 IMPLANT
CANISTER SUCT 1200ML W/VALVE (MISCELLANEOUS) ×4 IMPLANT
COVER BACK TABLE 60X90IN (DRAPES) ×4 IMPLANT
COVER MAYO STAND STRL (DRAPES) ×4 IMPLANT
DERMABOND ADVANCED (GAUZE/BANDAGES/DRESSINGS) ×2
DERMABOND ADVANCED .7 DNX12 (GAUZE/BANDAGES/DRESSINGS) ×6 IMPLANT
DRAPE LAPAROSCOPIC ABDOMINAL (DRAPES) ×4 IMPLANT
DRSG OPSITE POSTOP 4X6 (GAUZE/BANDAGES/DRESSINGS) ×4 IMPLANT
DRSG PAD ABDOMINAL 8X10 ST (GAUZE/BANDAGES/DRESSINGS) ×8 IMPLANT
ELECT BLADE 4.0 EZ CLEAN MEGAD (MISCELLANEOUS) ×4
ELECT COATED BLADE 2.86 ST (ELECTRODE) ×4 IMPLANT
ELECT REM PT RETURN 9FT ADLT (ELECTROSURGICAL) ×4
ELECTRODE BLDE 4.0 EZ CLN MEGD (MISCELLANEOUS) ×3 IMPLANT
ELECTRODE REM PT RTRN 9FT ADLT (ELECTROSURGICAL) ×3 IMPLANT
FUNNEL KELLER 2 DISP (MISCELLANEOUS) ×4 IMPLANT
GLOVE BIO SURGEON STRL SZ 6.5 (GLOVE) ×20 IMPLANT
GLOVE BIO SURGEON STRL SZ7.5 (GLOVE) ×4 IMPLANT
GOWN STRL REUS W/ TWL LRG LVL3 (GOWN DISPOSABLE) ×6 IMPLANT
GOWN STRL REUS W/TWL LRG LVL3 (GOWN DISPOSABLE) ×8
IMPL BREAST SILICONE 700CC (Breast) ×3 IMPLANT
IMPLANT BREAST SILICONE 700CC (Breast) ×4 IMPLANT
NDL SAFETY ECLIPSE 18X1.5 (NEEDLE) ×3 IMPLANT
NEEDLE HYPO 18GX1.5 SHARP (NEEDLE) ×4
NEEDLE HYPO 25X1 1.5 SAFETY (NEEDLE) ×4 IMPLANT
PENCIL SMOKE EVACUATOR (MISCELLANEOUS) ×4 IMPLANT
SET BASIN DAY SURGERY F.S. (CUSTOM PROCEDURE TRAY) ×4 IMPLANT
SIZER BREAST GEL HP 700CC (SIZER) ×4
SIZER BRST GEL HP 700CC (SIZER) ×3 IMPLANT
SLEEVE SCD COMPRESS KNEE MED (MISCELLANEOUS) ×4 IMPLANT
SPONGE LAP 18X18 RF (DISPOSABLE) ×8 IMPLANT
STRIP SUTURE WOUND CLOSURE 1/2 (MISCELLANEOUS) ×8 IMPLANT
SUT MNCRL AB 4-0 PS2 18 (SUTURE) ×24 IMPLANT
SUT MON AB 3-0 SH 27 (SUTURE) ×16
SUT MON AB 3-0 SH27 (SUTURE) ×12 IMPLANT
SUT MON AB 5-0 PS2 18 (SUTURE) ×12 IMPLANT
SUT VICRYL 4-0 PS2 18IN ABS (SUTURE) ×4 IMPLANT
SYR BULB IRRIG 60ML STRL (SYRINGE) ×4 IMPLANT
SYR CONTROL 10ML LL (SYRINGE) ×4 IMPLANT
TAPE MEASURE VINYL STERILE (MISCELLANEOUS) ×4 IMPLANT
TOWEL GREEN STERILE FF (TOWEL DISPOSABLE) ×8 IMPLANT
TRAY DSU PREP LF (CUSTOM PROCEDURE TRAY) ×4 IMPLANT
TUBE CONNECTING 20X1/4 (TUBING) ×4 IMPLANT
UNDERPAD 30X36 HEAVY ABSORB (UNDERPADS AND DIAPERS) ×8 IMPLANT
YANKAUER SUCT BULB TIP NO VENT (SUCTIONS) ×4 IMPLANT

## 2020-01-02 NOTE — Anesthesia Preprocedure Evaluation (Signed)
Anesthesia Evaluation  Patient identified by MRN, date of birth, ID band Patient awake    Reviewed: Allergy & Precautions, NPO status , Patient's Chart, lab work & pertinent test results  Airway Mallampati: II  TM Distance: >3 FB Neck ROM: Full    Dental no notable dental hx. (+) Teeth Intact, Dental Advisory Given   Pulmonary neg pulmonary ROS,    Pulmonary exam normal breath sounds clear to auscultation       Cardiovascular Normal cardiovascular exam Rhythm:Regular Rate:Normal  HLD   Neuro/Psych PSYCHIATRIC DISORDERS Anxiety negative neurological ROS     GI/Hepatic Neg liver ROS, GERD  ,  Endo/Other  negative endocrine ROSObese BMI 38  Renal/GU negative Renal ROS  negative genitourinary   Musculoskeletal  (+) Arthritis ,   Abdominal (+) + obese,   Peds  Hematology negative hematology ROS (+)   Anesthesia Other Findings Right breast cancer  Reproductive/Obstetrics                             Anesthesia Physical  Anesthesia Plan  ASA: II  Anesthesia Plan: General   Post-op Pain Management:    Induction: Intravenous  PONV Risk Score and Plan: 3 and Ondansetron, Dexamethasone and Midazolam  Airway Management Planned: LMA  Additional Equipment:   Intra-op Plan:   Post-operative Plan: Extubation in OR  Informed Consent: I have reviewed the patients History and Physical, chart, labs and discussed the procedure including the risks, benefits and alternatives for the proposed anesthesia with the patient or authorized representative who has indicated his/her understanding and acceptance.     Dental advisory given  Plan Discussed with: CRNA  Anesthesia Plan Comments:         Anesthesia Quick Evaluation

## 2020-01-02 NOTE — Interval H&P Note (Signed)
History and Physical Interval Note:  01/02/2020 7:41 AM  Wendy [name]  has presented today for surgery, with the diagnosis of S/p Right Mastectomy, Acquired absence of right breast.  The various methods of treatment have been discussed with the patient and family. After consideration of risks, benefits and other options for treatment, the patient has consented to  Procedure(s) with comments: REMOVAL OF TISSUE EXPANDER AND PLACEMENT OF IMPLANT (Right) - 2.5 hours total for surgery MASTOPEXY / REDUCTION FOR SYMMETRY (Left) REMOVAL PORT-A-CATH (Right) as a surgical intervention.  The patient's history has been reviewed, patient examined, no change in status, stable for surgery.  I have reviewed the patient's chart and labs.  Questions were answered to the patient's satisfaction.     Loel Lofty Laker Thompson

## 2020-01-02 NOTE — Progress Notes (Signed)
Post-op abx rx

## 2020-01-02 NOTE — Anesthesia Postprocedure Evaluation (Signed)
Anesthesia Post Note  Patient: CISSY GALBREATH  Procedure(s) Performed: REMOVAL OF TISSUE EXPANDER AND PLACEMENT OF IMPLANT (Right Breast) MASTOPEXY / REDUCTION FOR SYMMETRY (Left Breast) REMOVAL PORT-A-CATH (Left Chest)     Patient location during evaluation: PACU Anesthesia Type: General Level of consciousness: awake and alert Pain management: pain level controlled Vital Signs Assessment: post-procedure vital signs reviewed and stable Respiratory status: spontaneous breathing, nonlabored ventilation and respiratory function stable Cardiovascular status: blood pressure returned to baseline and stable Postop Assessment: no apparent nausea or vomiting Anesthetic complications: no   No complications documented.  Last Vitals:  Vitals:   01/02/20 1115 01/02/20 1132  BP: 114/67 121/76  Pulse: 80 77  Resp: (!) 22 15  Temp:    SpO2: 100% 100%    Last Pain:  Vitals:   01/02/20 1202  TempSrc:   PainSc: [place] Imaad Reuss

## 2020-01-02 NOTE — Transfer of Care (Signed)
Immediate Anesthesia Transfer of Care Note  Patient: Wendy [name]  Procedure(s) Performed: REMOVAL OF TISSUE EXPANDER AND PLACEMENT OF IMPLANT (Right Breast) MASTOPEXY / REDUCTION FOR SYMMETRY (Left Breast) REMOVAL PORT-A-CATH (Left Chest)  Patient Location: PACU  Anesthesia Type:General  Level of Consciousness: drowsy and patient cooperative  Airway & Oxygen Therapy: Patient Spontanous Breathing and Patient connected to face mask oxygen  Post-op Assessment: Report given to RN and Post -op Vital signs reviewed and stable  Post vital signs: Reviewed and stable  Last Vitals:  Vitals Value Taken Time  BP 106/56 01/02/20 1053  Temp    Pulse 82 01/02/20 1054  Resp 9 01/02/20 1054  SpO2 100 % 01/02/20 1054  Vitals shown include unvalidated device data.  Last Pain:  Vitals:   01/02/20 0743  TempSrc: Oral  PainSc: 0-No pain      Patients Stated Pain Goal: 4 (39/12/25 8346)  Complications: No complications documented.

## 2020-01-02 NOTE — Op Note (Signed)
Op report  DATE OF OPERATION: 01/02/2020  LOCATION: [place]  SURGICAL DIVISION: Plastic Surgery  PREOPERATIVE DIAGNOSES:  1. History of right breast cancer.  2. Acquired absence of right breast.  3. Left breast asymmetry due to breast cancer 4. Post chemotherapy treatment  POSTOPERATIVE DIAGNOSES:  Same  PROCEDURE:  1. Right exchange of tissue expander for implant.  2. Right  capsulotomies for implant respositioning. 3. Left breast reduction / mastopexy for symmetry. 4. Removal of port-a-cath.  SURGEON: Kaiven Vester Sanger Jayron Maqueda, DO  ASSISTANT: Roetta Sessions, PA  ANESTHESIA:  General.   COMPLICATIONS: None.   IMPLANTS: Left -  ~ 200 gm Right - [place] Smooth Round Ultra High Profile Gel 700cc. Ref #[phone].  Serial Number [id number]  INDICATIONS FOR PROCEDURE:  The patient, Wendy [name], is a 64 y.o. female born on October 23, 1955, is here for treatment after a right mastectomy and chemotherapy treatment.  She had a right tissue expander placed at the time of mastectomy. She now presents for exchange of her expander for an implant.  She requires capsulotomies to better position the implant.  She also has breast asymmetry due to the reconstruction and will undergo a reduction mastopexy on the opposite side. She has finished her chemotherapy so we will remove the port-a-cath as cleared by oncology. MRN: [id number]  CONSENT:  Informed consent was obtained directly from the patient. Risks, benefits and alternatives were fully discussed. Specific risks including but not limited to bleeding, infection, hematoma, seroma, scarring, pain, implant infection, implant extrusion, capsular contracture, asymmetry, wound healing problems, and need for further surgery were all discussed. The patient did have an ample opportunity to have her questions answered to her satisfaction.   DESCRIPTION OF PROCEDURE:  The patient was taken to the operating room. SCDs were  placed and IV antibiotics were given. The patient's chest was prepped and draped in a sterile fashion. A time out was performed and the implants to be used were identified.    Right breast: One percent Lidocaine with epinephrine was used to infiltrate at the incision site. The old mastectomy scar was excised.  The mastectomy flaps from the superior and inferior flaps were raised over the pectoralis major muscle for several centimeters to minimize tension for the closure. The pectoralis was split inferior to the skin incision to expose and remove the tissue expander.  Inspection of the pocket showed a normal healthy capsule and good integration of the biologic matrix.  The pocket was irrigated with antibiotic solution.  Circumferential capsulotomies were performed to allow for breast pocket expansion.  Measurements were made and a sizer used to confirm adequate pocket size for the implant dimensions.  Hemostasis was ensured with electrocautery. New gloves were placed. The implant was soaked in antibiotic solution and then placed in the pocket using the Keller funnel. The pectoralis major muscle and capsule on the anterior surface were re-closed with a 3-0 Monocryl suture. The remaining skin was closed with 4-0 Monocryl deep dermal and 5-0 Monocryl subcuticular stitches.   Left breast: Preoperative markings were confirmed.  Incision lines were injected with local containing epinephrine.  After waiting for vasoconstriction, the marked lines were incised.  A Wise-pattern superomedial breast reduction was performed by de-epithelializing the pedicle, using bovie to create the superomedial pedicle, and removing breast tissue from the superior, lateral, and inferior portions of the breast.  Care was taken to not undermine the breast pedicle. Hemostasis was achieved.  The nipple was gently rotated into position and the soft  tissue was closed with 4-0 Monocryl.  The patient was sat upright and size and shape symmetry  was confirmed.  The pocket was irrigated and hemostasis confirmed.  The deep tissues were approximated with 3-0 monocryl sutures and the skin was closed with deep dermal and subcuticular 4-0 Monocryl sutures.  Dermabond was applied.  A breast binder and ABDs were placed.  The nipple and skin flaps had good capillary refill at the end of the procedure.  The patient tolerated the procedure well. The patient was allowed to wake from anesthesia and taken to the recovery room in satisfactory condition.  Port-a-cath:  The #15 blade was used to make an incision at the previous incision site of the port-a-cath placement.  The Bovie was used to dissect to the port.  The port was released from the surrounding tissue and the two prolene released.  The 3-0 Vicryl was placed at catheter site.  The patient was placed in trendelenburg position.  The vicryl was cinched as the catheter was removed.  The deep layers were closed with the 4-0 Monocryl followed by a running subcuticular 5-0 Monocryl.  Derma bond was applied.  The advanced practice practitioner (APP) assisted throughout the case.  The APP was essential in retraction and counter traction when needed to make the case progress smoothly.  This retraction and assistance made it possible to see the tissue plans for the procedure.  The assistance was needed for blood control, tissue re-approximation and assisted with closure of the incision site.

## 2020-01-02 NOTE — Discharge Instructions (Signed)
INSTRUCTIONS FOR AFTER BREAST SURGERY   You are getting ready to undergo breast surgery.  You will likely have some questions about what to expect following your operation.  The following information will help you and your family understand what to expect when you are discharged from the hospital.  Following these guidelines will help ensure a smooth recovery and reduce risks of complications.   Postoperative instructions include information on: diet, wound care, medications and physical activity.  AFTER SURGERY Expect to go home after the procedure.  In some cases, you may need to spend one night in the hospital for observation.  DIET Breast surgery does not require a specific diet.  However, the healthier you eat the better your body can start healing. It is important to increasing your protein intake.  This means limiting the foods with sugar and carbohydrates.  Focus on vegetables and some meat.  If you have any liposuction during your procedure be sure to drink water.  If your urine is bright yellow, then it is concentrated, and you need to drink more water.  As a general rule after surgery, you should have 8 ounces of water every hour while awake.  If you find you are persistently nauseated or unable to take in liquids let us know.  NO TOBACCO USE or EXPOSURE.  This will slow your healing process and increase the risk of a wound.  WOUND CARE If you don't have a drain:  You can shower the day after surgery. Use fragrance free soap.  Dial, [place] and [place] are usually mild on the skin. If you have steri-strips / tape directly attached to your skin leave them in place. It is OK to get these wet.  No baths, pools or hot tubs for two weeks. We close your incision to leave the smallest and best-looking scar. No ointment or creams on your incisions until given the go ahead.  Especially not Neosporin (Too many skin reactions with this one).  A few weeks after surgery you can use Mederma and start  massaging the scar. We ask you to wear your binder or sports bra for the first 6 weeks around the clock, including while sleeping. This provides added comfort and helps reduce the fluid accumulation at the surgery site.  ACTIVITY No heavy lifting until cleared by the doctor.  This usually means no more than a half-gallon of milk.  It is OK to walk and climb stairs. In fact, moving your legs is very important to decrease your risk of a blood clot.  It will also help keep you from getting deconditioned.  Every 1 to 2 hours get up and walk for 5 minutes. This will help with a quicker recovery back to normal.  Let pain be your guide so you don't do too much.  This is not the time for spring cleaning and don't plan on taking care of anyone else.  This time is for you to recover,  You will be more comfortable if you sleep and rest with your head elevated either with a few pillows under you or in a recliner.  No stomach sleeping for a three months.  WORK Everyone returns to work at different times. As a rough guide, most people take at least 1 - 2 weeks off prior to returning to work. If you need documentation for your job, bring the forms to your postoperative follow up visit.  DRIVING Arrange for someone to bring you home from the hospital.  You may be  able to drive a few days after surgery but not while taking any narcotics or valium.  BOWEL MOVEMENTS Constipation can occur after anesthesia and while taking pain medication.  It is important to stay ahead for your comfort.  We recommend taking Milk of Magnesia (2 tablespoons; twice a day) while taking the pain pills.  SEROMA This is fluid your body tried to put in the surgical site.  This is normal but if it creates tight skinny skin let us know.  It usually decreases in a few weeks.  MEDICATIONS and PAIN CONTROL At your preoperative visit for you history and physical you were given the following medications: 1. An antibiotic: Start this medication  when you get home and take according to the instructions on the bottle. 2. Zofran 4 mg:  This is to treat nausea and vomiting.  You can take this every 6 hours as needed and only if needed. 3. Valium 2 mg: This is for muscle tightness if you have an implant or expander. This will help relax your muscle which also helps with pain control.  This can be taken every 12 hours as needed.  Don't drive after taking this medication. 4. Norco (hydrocodone/acetaminophen) 5/325 mg:  This is only to be used after you have taken the motrin or the tylenol. Every 8 hours as needed. Over the counter Medication to take: 5. Ibuprofen (Motrin) 600 mg:  Take this every 6 hours.  If you have additional pain then take 500 mg of the tylenol every 8 hours.  Only take the Norco after you have tried these two. 6. Miralax or stool softener of choice: Take this according to the bottle if you take the [place] Call your surgeon's office if any of the following occur:  Fever 101 degrees F or greater  Excessive bleeding or fluid from the incision site.  Pain that increases over time without aid from the medications  Redness, warmth, or pus draining from incision sites  Persistent nausea or inability to take in liquids  Severe misshapen area that underwent the operation.  Here are some resources:  1. Plastic surgery website: https://www.plasticsurgery.org/for-medical-professionals/education-and-resources/publications/breast-reconstruction-magazine 2. Breast Reconstruction Awareness Campaign:  HotelLives.co.nz 3. Plastic surgery Implant information:  https://www.plasticsurgery.org/patient-safety/breast-implant-safety   Post Anesthesia Home Care Instructions  Activity: Get plenty of rest for the remainder of the day. A responsible individual must stay with you for 24 hours following the procedure.  For the next 24 hours, DO NOT: -Drive a car -Paediatric nurse -Drink alcoholic  beverages -Take any medication unless instructed by your physician -Make any legal decisions or sign important papers.  Meals: Start with liquid foods such as gelatin or soup. Progress to regular foods as tolerated. Avoid greasy, spicy, heavy foods. If nausea and/or vomiting occur, drink only clear liquids until the nausea and/or vomiting subsides. Call your physician if vomiting continues.  Special Instructions/Symptoms: Your throat may feel dry or sore from the anesthesia or the breathing tube placed in your throat during surgery. If this causes discomfort, gargle with warm salt water. The discomfort should disappear within 24 hours.  If you had a scopolamine patch placed behind your ear for the management of post- operative nausea and/or vomiting:  1. The medication in the patch is effective for 72 hours, after which it should be removed.  Wrap patch in a tissue and discard in the trash. Wash hands thoroughly with soap and water. 2. You may remove the patch earlier than 72 hours if you experience unpleasant  side effects which may include dry mouth, dizziness or visual disturbances. 3. Avoid touching the patch. Wash your hands with soap and water after contact with the patch.     No Tylenol until 4:15pm if needed. No Ibuprofen/Motrin until 6:15pm if needed.

## 2020-01-02 NOTE — Anesthesia Procedure Notes (Signed)
Procedure Name: LMA Insertion Date/Time: 01/02/2020 8:34 AM Performed by: Signe Colt, CRNA Pre-anesthesia Checklist: Patient identified, Emergency Drugs available, Suction available and Patient being monitored Patient Re-evaluated:Patient Re-evaluated prior to induction Oxygen Delivery Method: Circle system utilized Preoxygenation: Pre-oxygenation with 100% oxygen Induction Type: IV induction Ventilation: Mask ventilation without difficulty LMA: LMA inserted LMA Size: 4.0 Number of attempts: 1 Airway Equipment and Method: Bite block Placement Confirmation: positive ETCO2 Tube secured with: Tape Dental Injury: Teeth and Oropharynx as per pre-operative assessment

## 2020-01-03 ENCOUNTER — Encounter: Payer: Self-pay | Admitting: Plastic Surgery

## 2020-01-03 ENCOUNTER — Encounter (HOSPITAL_BASED_OUTPATIENT_CLINIC_OR_DEPARTMENT_OTHER): Payer: Self-pay | Admitting: Plastic Surgery

## 2020-01-03 LAB — SURGICAL PATHOLOGY

## 2020-01-03 MED ORDER — ACETAMINOPHEN-CODEINE #3 300-30 MG PO TABS: 1.0000 | ORAL_TABLET | Freq: Three times a day (TID) | ORAL | 0 refills | Status: AC | PRN

## 2020-01-10 ENCOUNTER — Other Ambulatory Visit: Payer: Self-pay

## 2020-01-10 ENCOUNTER — Encounter: Payer: Self-pay | Admitting: Plastic Surgery

## 2020-01-10 ENCOUNTER — Ambulatory Visit (INDEPENDENT_AMBULATORY_CARE_PROVIDER_SITE_OTHER): Payer: No Typology Code available for payment source | Admitting: Plastic Surgery

## 2020-01-10 VITALS — BP 115/73 | HR 102 | Temp 97.1°F

## 2020-01-10 DIAGNOSIS — C50411 Malignant neoplasm of upper-outer quadrant of right female breast: Secondary | ICD-10-CM

## 2020-01-10 DIAGNOSIS — Z9011 Acquired absence of right breast and nipple: Secondary | ICD-10-CM

## 2020-01-10 DIAGNOSIS — Z170 Estrogen receptor positive status [ER+]: Secondary | ICD-10-CM

## 2020-01-10 NOTE — Progress Notes (Signed)
   Subjective:    Patient ID: Wendy [name], female    DOB: 10-Apr-1956, 64 y.o.   MRN: [id number]  The patient is a 64 year old female here for follow-up after undergoing breast surgery. She had exchange of her right breast expander for a silicone implant and left breast mastopexy reduction. Overall she is doing very well and very happy with her results. There is no sign of infection, hematoma or seroma on either side. I remove the honeycomb dressing on the left breast. Steri-Strips are still in place. She will start radiation in 3 weeks.    Review of Systems  Constitutional: Positive for activity change.  HENT: Negative.   Eyes: Negative.   Respiratory: Negative.   Cardiovascular: Negative.   Gastrointestinal: Negative.   Genitourinary: Negative.        Objective:   Physical Exam Vitals and nursing note reviewed.  Constitutional:      Appearance: Normal appearance.  HENT:     Head: Normocephalic and atraumatic.  Cardiovascular:     Rate and Rhythm: Normal rate.  Pulmonary:     Effort: Pulmonary effort is normal.  Neurological:     General: No focal deficit present.     Mental Status: She is alert. Mental status is at baseline.  Psychiatric:        Mood and Affect: Mood normal.        Thought Content: Thought content normal.          Assessment & Plan:     ICD-10-CM   1. S/P mastectomy, right  Z90.11   2. Malignant neoplasm of upper-outer quadrant of right breast in female, estrogen receptor positive ([place])  C50.411    Z17.0   3. Acquired absence of right breast  Z90.11       We will plan to see the patient back in a week and a half. Then I will see her once radiation has completed. At that time we can start talking about nipple areola tattoo for her right side. The patient is aware if she has any questions or concerns during the radiation. Definitely give me a call. Will get pictures at her next visit.

## 2020-01-17 ENCOUNTER — Telehealth: Payer: Self-pay | Admitting: *Deleted

## 2020-01-17 ENCOUNTER — Ambulatory Visit: Payer: No Typology Code available for payment source | Admitting: Hematology and Oncology

## 2020-01-17 NOTE — Telephone Encounter (Signed)
Received call from pt with complaint of bilateral ankle and feet edema x1 week. Pt also complaint of increase numbness and tingling in bilateral feet.  Pt states edema is not relieved by rest and elevation.  Pt denies shortness of breath.  Pt requesting to be evaluated by MD.  Wendy [name] scheduled and pt verbalized understanding of apt date and time.

## 2020-01-17 NOTE — Progress Notes (Signed)
Patient Care Team: [name], MD as PCP - General (Family Medicine) Mauro Kaufmann, RN as Oncology Nurse Navigator Rockwell Germany, RN as Oncology Nurse Navigator  DIAGNOSIS:    ICD-10-CM   1. Malignant neoplasm of upper-outer quadrant of right breast in female, estrogen receptor positive ([place])  C50.411    Z17.0     SUMMARY OF ONCOLOGIC HISTORY: Oncology History  Malignant neoplasm of upper-outer quadrant of right breast in female, estrogen receptor positive ([place])  05/20/2019 Initial Diagnosis   Palpable right breast mass superficial, ultrasound 11 o'clock position 1.7 cm: Biopsy revealed grade 3 IDC with DCIS ER 100%, PR 30%, Ki-67 20%, HER-2 by IHC negative, axilla lymph node biopsy positive   06/01/2019 Cancer Staging   Staging form: Breast, AJCC 8th Edition - Clinical stage from 06/01/2019: Stage IIA (cT1c, cN1, cM0, G3, ER+, PR+, HER2-) - Signed by Nicholas Lose, MD on 06/01/2019   06/27/2019 Surgery   Right lumpectomy [name]): IDC, grade 3, at least 2.5cm, involving a complex sclerosing lesion, 1.1cm, with high grade DCIS, involved margins, 1/3 right axillary lymph nodes positive, 1.4cm.    07/11/2019 Cancer Staging   Staging form: Breast, AJCC 8th Edition - Pathologic stage from 07/11/2019: Stage IIA (pT2, pN1a, cM0, G3, ER+, PR+, HER2-) - Signed by Nicholas Lose, MD on 07/11/2019   07/19/2019 Oncotype testing   Mammaprint: high risk, 29% chance of recurrence in 10 years without systemic treatment.    09/08/2019 Surgery   Re-excision x2 (08/01/19 and 08/12/19) followed by right mastectomy [name]): residual carcinoma and DCIS, 1/5 lymph nodes positive, clear margins.    10/14/2019 -  Chemotherapy   The patient had dexamethasone (DECADRON) 4 MG tablet, 4 mg (100 % of original dose 4 mg), Oral, Daily, 1 of 1 cycle, Start date: 07/22/2019, End date: 11/24/2019 Dose modification: 4 mg (original dose 4 mg, Cycle 0) palonosetron (ALOXI) injection 0.25 mg, 0.25 mg, Intravenous,  Once,  4 of 4 cycles Administration: 0.25 mg (10/14/2019), 0.25 mg (11/04/2019), 0.25 mg (11/24/2019), 0.25 mg (12/15/2019) pegfilgrastim (NEULASTA ONPRO KIT) injection 6 mg, 6 mg, Subcutaneous, Once, 4 of 4 cycles Administration: 6 mg (10/14/2019), 6 mg (11/04/2019), 6 mg (11/24/2019), 6 mg (12/15/2019) cyclophosphamide (CYTOXAN) 1,260 mg in sodium chloride 0.9 % 250 mL chemo infusion, 600 mg/m2 = 1,260 mg, Intravenous,  Once, 4 of 4 cycles Dose modification: 500 mg/m2 (original dose 600 mg/m2, Cycle 2, Reason: Dose not tolerated) Administration: 1,260 mg (10/14/2019), 1,040 mg (11/04/2019), 1,040 mg (11/24/2019), 1,040 mg (12/15/2019) DOCEtaxel (TAXOTERE) 160 mg in sodium chloride 0.9 % 250 mL chemo infusion, 75 mg/m2 = 160 mg, Intravenous,  Once, 4 of 4 cycles Dose modification: 65 mg/m2 (original dose 75 mg/m2, Cycle 2, Reason: Dose not tolerated) Administration: 160 mg (10/14/2019), 140 mg (11/04/2019), 140 mg (11/24/2019), 140 mg (12/15/2019)  for chemotherapy treatment.      CHIEF COMPLIANT: Follow-up of bilateral lower extremity edema  INTERVAL HISTORY: Wendy [name] is a 64 y.o. with above-mentioned history of right breast cancer who underwent a lumpectomy followed by re-excision and then a mastectomy, and completed adjuvant chemotherapy.She presents to the clinic todaydue to bilateral edema and numbness and tingling in her feet x1 week.   ALLERGIES:  is allergic to hydrocodone-acetaminophen, oxycodone, other, vicodin [hydrocodone-acetaminophen], and brimonidine.  MEDICATIONS:  Current Outpatient Medications  Medication Sig Dispense Refill  . diazepam (VALIUM) 2 MG tablet Take 1 tablet (2 mg total) by mouth every 12 (twelve) hours as needed for muscle spasms. (Patient not  taking: Reported on 01/10/2020) 20 tablet 0  . dorzolamide-timolol (COSOPT) 22.3-6.8 MG/ML ophthalmic solution PLACE 1 DROP INTO BOTH EYES 2 (TWO) TIMES DAILY.    . famotidine (PEPCID) 20 MG tablet Take 20 mg by mouth 2 (two) times daily.     Marland Kitchen latanoprost (XALATAN) 0.005 % ophthalmic solution 1 drop at bedtime. (Patient not taking: Reported on 01/10/2020)    . lidocaine-prilocaine (EMLA) cream Apply to affected area once (Patient not taking: Reported on 01/10/2020) 30 g 3  . LORazepam (ATIVAN) 0.5 MG tablet Take 1 tablet (0.5 mg total) by mouth at bedtime as needed for sleep. 30 tablet 0  . magic mouthwash w/lidocaine SOLN Take 5 mLs by mouth 4 (four) times daily as needed for mouth pain. 240 mL 2  . ondansetron (ZOFRAN) 8 MG tablet Take 1 tablet (8 mg total) by mouth 2 (two) times daily as needed for refractory nausea / vomiting. Start on day 3 after chemo. 30 tablet 1  . predniSONE (DELTASONE) 10 MG tablet Take 1 tablet (10 mg total) by mouth as directed. Taper 6,5,4,3,2,1 (Patient not taking: Reported on 01/10/2020) 21 tablet 0  . prochlorperazine (COMPAZINE) 10 MG tablet Take 1 tablet (10 mg total) by mouth every 6 (six) hours as needed (Nausea or vomiting). (Patient not taking: Reported on 01/10/2020) 30 tablet 1  . triamcinolone ointment (KENALOG) 0.5 % Apply 1 application topically 2 (two) times daily. (Patient not taking: Reported on 01/10/2020) 30 g 0   No current facility-administered medications for this visit.   Facility-Administered Medications Ordered in Other Visits  Medication Dose Route Frequency Provider Last Rate Last Admin  . heparin lock flush 100 unit/mL  500 Units Intravenous Once Nicholas Lose, MD      . sodium chloride flush (NS) 0.9 % injection 10 mL  10 mL Intravenous PRN Nicholas Lose, MD   10 mL at 10/14/19 1032    PHYSICAL EXAMINATION: ECOG PERFORMANCE STATUS: 1 - Symptomatic but completely ambulatory  There were no vitals filed for this visit. There were no vitals filed for this visit.  LABORATORY DATA:  I have reviewed the data as listed CMP Latest Ref Rng & Units 12/15/2019 11/24/2019 11/04/2019  Glucose 70 - 99 mg/dL 121(H) 130(H) 141(H)  BUN 8 - 23 mg/dL 7(L) 9 10  Creatinine 0.44 - 1.00 mg/dL 0.65  0.69 0.78  Sodium 135 - 145 mmol/L 140 141 144  Potassium 3.5 - 5.1 mmol/L 3.7 3.8 3.7  Chloride 98 - 111 mmol/L 111 111 111  CO2 22 - 32 mmol/L '23 23 23  ' Calcium 8.9 - 10.3 mg/dL 8.6(L) 9.2 9.1  Total Protein 6.5 - 8.1 g/dL 5.8(L) 6.7 7.1  Total Bilirubin 0.3 - 1.2 mg/dL 0.5 0.4 0.4  Alkaline Phos 38 - 126 U/L 58 69 82  AST 15 - 41 U/L '16 17 18  ' ALT 0 - 44 U/L '11 13 13    ' Lab Results  Component Value Date   WBC 4.6 12/15/2019   HGB 12.1 12/15/2019   HCT 38.5 12/15/2019   MCV 89.5 12/15/2019   PLT 233 12/15/2019   NEUTROABS 2.2 12/15/2019    ASSESSMENT & PLAN:  Malignant neoplasm of upper-outer quadrant of right breast in female, estrogen receptor positive ([place]) 06/27/2019:Right lumpectomy [name]): IDC, grade 3, at least 2.5cm, involving a complex sclerosing lesion, 1.1cm, with high grade DCIS, involved margins, 1/3 right axillary lymph nodes positive, 1.4cm.ER 100%, PR 30%, Ki-67 20%, HER-2 IHC negative T2 N1 stage IIA MammaPrint: High risk, 10-year  risk of recurrence untreated: 29% 09/08/2019:Re-excision x2 (08/01/19 and 08/12/19) followed by right mastectomy [name]): residual carcinoma and DCIS, 1/5 lymph nodes positive, clear margins.  Treatment plan: 1.Adjuvant chemotherapy withTaxotere and Cytoxan every 3 weeks x4 completed 12/15/2019 2.Adjuvant radiation therapy 3.Follow-up adjuvant antiestrogen therapywith anastrozole 1 mg daily x7 years Genetic testing ------------------------------------------------------------------------------------------------------ Treatment plan: Plan for adjuvant antiestrogen therapy with anastrozole x7 years after radiation is complete  Bilateral lower extremity swelling: I discussed with her different causes of lower extremity swelling.  We will obtain CBC CMP today.  If all of that is normal then we will set her up for an ultrasound and echocardiogram. I gave her a prescription for Lasix 20 mg twice a day for the first week and then once  a day thereafter. I also sent a prescription for micro potassium 10 mEq daily.  Peripheral neuropathy: Mild to moderate  Return to clinic after radiation is complete.   No orders of the defined types were placed in this encounter.  The patient has a good understanding of the overall plan. she agrees with it. she will call with any problems that may develop before the next visit here.  Total time spent: 30 mins including face to face time and time spent for planning, charting and coordination of care  Nicholas Lose, MD 01/18/2020  I, Cloyde Reams Dorshimer, am acting as scribe for Dr. Nicholas Lose.  I have reviewed the above documentation for accuracy and completeness, and I agree with the above.

## 2020-01-18 ENCOUNTER — Inpatient Hospital Stay
Payer: No Typology Code available for payment source | Attending: Hematology and Oncology | Admitting: Hematology and Oncology

## 2020-01-18 ENCOUNTER — Other Ambulatory Visit: Payer: Self-pay

## 2020-01-18 ENCOUNTER — Inpatient Hospital Stay: Payer: No Typology Code available for payment source

## 2020-01-18 DIAGNOSIS — C773 Secondary and unspecified malignant neoplasm of axilla and upper limb lymph nodes: Secondary | ICD-10-CM | POA: Diagnosis not present

## 2020-01-18 DIAGNOSIS — Z170 Estrogen receptor positive status [ER+]: Secondary | ICD-10-CM | POA: Insufficient documentation

## 2020-01-18 DIAGNOSIS — C50411 Malignant neoplasm of upper-outer quadrant of right female breast: Secondary | ICD-10-CM | POA: Insufficient documentation

## 2020-01-18 DIAGNOSIS — Z9011 Acquired absence of right breast and nipple: Secondary | ICD-10-CM | POA: Diagnosis not present

## 2020-01-18 DIAGNOSIS — Z5111 Encounter for antineoplastic chemotherapy: Secondary | ICD-10-CM | POA: Diagnosis present

## 2020-01-18 DIAGNOSIS — Z9221 Personal history of antineoplastic chemotherapy: Secondary | ICD-10-CM | POA: Diagnosis not present

## 2020-01-18 DIAGNOSIS — G620 Drug-induced polyneuropathy: Secondary | ICD-10-CM | POA: Diagnosis not present

## 2020-01-18 DIAGNOSIS — R609 Edema, unspecified: Secondary | ICD-10-CM | POA: Diagnosis not present

## 2020-01-18 LAB — CBC WITH DIFFERENTIAL (CANCER CENTER ONLY)
Abs Immature Granulocytes: 0 10*3/uL (ref 0.00–0.07)
Basophils Absolute: 0 10*3/uL (ref 0.0–0.1)
Basophils Relative: 0 %
Eosinophils Absolute: 0.1 10*3/uL (ref 0.0–0.5)
Eosinophils Relative: 1 %
HCT: 39.7 % (ref 36.0–46.0)
Hemoglobin: 12.2 g/dL (ref 12.0–15.0)
Immature Granulocytes: 0 %
Lymphocytes Relative: 34 %
Lymphs Abs: 1.5 10*3/uL (ref 0.7–4.0)
MCH: 28.6 pg (ref 26.0–34.0)
MCHC: 30.7 g/dL (ref 30.0–36.0)
MCV: 93 fL (ref 80.0–100.0)
Monocytes Absolute: 0.6 10*3/uL (ref 0.1–1.0)
Monocytes Relative: 13 %
Neutro Abs: 2.3 10*3/uL (ref 1.7–7.7)
Neutrophils Relative %: 52 %
Platelet Count: 168 10*3/uL (ref 150–400)
RBC: 4.27 MIL/uL (ref 3.87–5.11)
RDW: 16.9 % — ABNORMAL HIGH (ref 11.5–15.5)
WBC Count: 4.4 10*3/uL (ref 4.0–10.5)
nRBC: 0 % (ref 0.0–0.2)

## 2020-01-18 LAB — CMP (CANCER CENTER ONLY)
ALT: 11 U/L (ref 0–44)
AST: 19 U/L (ref 15–41)
Albumin: 3.3 g/dL — ABNORMAL LOW (ref 3.5–5.0)
Alkaline Phosphatase: 56 U/L (ref 38–126)
Anion gap: 9 (ref 5–15)
BUN: 10 mg/dL (ref 8–23)
CO2: 24 mmol/L (ref 22–32)
Calcium: 8.8 mg/dL — ABNORMAL LOW (ref 8.9–10.3)
Chloride: 111 mmol/L (ref 98–111)
Creatinine: 0.69 mg/dL (ref 0.44–1.00)
GFR, Est AFR Am: >60 mL/min (ref >60)
GFR, Estimated: >60 mL/min (ref >60)
Glucose, Bld: 95 mg/dL (ref 70–99)
Potassium: 4 mmol/L (ref 3.5–5.1)
Sodium: 144 mmol/L (ref 135–145)
Total Bilirubin: 0.7 mg/dL (ref 0.3–1.2)
Total Protein: 6.1 g/dL — ABNORMAL LOW (ref 6.5–8.1)

## 2020-01-18 MED ORDER — POTASSIUM CHLORIDE ER 10 MEQ PO CPCR: 10.0000 meq | ORAL_CAPSULE | Freq: Two times a day (BID) | ORAL | 0 refills | Status: DC

## 2020-01-18 MED ORDER — FUROSEMIDE 20 MG PO TABS
20.0000 mg | ORAL_TABLET | Freq: Two times a day (BID) | ORAL | 1 refills | Status: DC
Start: 2020-01-18 — End: 2020-03-21

## 2020-01-18 NOTE — Assessment & Plan Note (Signed)
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  Treatment plan: 1.Adjuvant chemotherapy withTaxotere and Cytoxan every 3 weeks x4 completed 12/15/2019 2.Adjuvant radiation therapy 3.Follow-up adjuvant antiestrogen therapywith anastrozole 1 mg daily x7 years Genetic testing ------------------------------------------------------------------------------------------------------ Current treatment: Plan for adjuvant antiestrogen therapy with anastrozole x7 years  Anastrozole counseling: We discussed the risks and benefits of anti-estrogen therapy with aromatase inhibitors. These include but not limited to insomnia, hot flashes, mood changes, vaginal dryness, bone density loss, and weight gain. We strongly believe that the benefits far outweigh the risks. Patient understands these risks and consented to starting treatment. Planned treatment duration is 7 years.  I counseled her about participating in antiestrogen therapy compliance study Return to clinic in 3 months for survivorship care plan visit

## 2020-01-19 ENCOUNTER — Telehealth: Payer: Self-pay | Admitting: Hematology and Oncology

## 2020-01-19 ENCOUNTER — Other Ambulatory Visit: Payer: Self-pay | Admitting: Hematology and Oncology

## 2020-01-19 DIAGNOSIS — Z170 Estrogen receptor positive status [ER+]: Secondary | ICD-10-CM

## 2020-01-19 DIAGNOSIS — C50411 Malignant neoplasm of upper-outer quadrant of right female breast: Secondary | ICD-10-CM

## 2020-01-19 NOTE — Telephone Encounter (Signed)
No 7/14 los, no changes made to pt schedule  

## 2020-01-19 NOTE — Telephone Encounter (Signed)
I informed the patient of the CBC and CMP were relatively normal except for mild hypoalbuminemia. To further evaluate the lower extremity edema we will obtain an echocardiogram and a lower extremity venous ultrasound. I will call her with the results of this test.

## 2020-01-20 ENCOUNTER — Ambulatory Visit (HOSPITAL_COMMUNITY)
Admission: RE | Admit: 2020-01-20 | Discharge: 2020-01-20 | Disposition: A | Discharge status: Home / Self Care | Payer: No Typology Code available for payment source | Source: Ambulatory Visit | Attending: Hematology and Oncology | Admitting: Hematology and Oncology

## 2020-01-20 ENCOUNTER — Other Ambulatory Visit: Payer: Self-pay

## 2020-01-20 DIAGNOSIS — C50411 Malignant neoplasm of upper-outer quadrant of right female breast: Secondary | ICD-10-CM | POA: Diagnosis present

## 2020-01-20 DIAGNOSIS — Z170 Estrogen receptor positive status [ER+]: Secondary | ICD-10-CM | POA: Diagnosis present

## 2020-01-20 NOTE — Progress Notes (Signed)
Lower extremity venous has been completed.   Preliminary results in CV Proc.   Abram Sander 01/20/2020 10:13 AM

## 2020-01-23 DIAGNOSIS — Z9889 Other specified postprocedural states: Secondary | ICD-10-CM | POA: Insufficient documentation

## 2020-01-23 NOTE — Progress Notes (Deleted)
Patient is a 64 year old female who underwent exchange of right breast tissue expander for implant, left breast reduction/mastopexy for symmetry and removal of Port-A-Cath on 01/02/2020 with Dr. Marla Roe.   ~ 3 weeks PO  She begins radiation in approximately a week.  Once radiation is completed we can start talking about NAC tattoos for the right side.  Photos  Follow up after radiation?

## 2020-01-24 ENCOUNTER — Other Ambulatory Visit: Payer: Self-pay

## 2020-01-24 ENCOUNTER — Ambulatory Visit
Admission: RE | Admit: 2020-01-24 | Discharge: 2020-01-24 | Disposition: A | Discharge status: Home / Self Care | Payer: No Typology Code available for payment source | Source: Ambulatory Visit | Attending: Radiation Oncology | Admitting: Radiation Oncology

## 2020-01-24 ENCOUNTER — Encounter: Payer: Self-pay | Admitting: Plastic Surgery

## 2020-01-24 ENCOUNTER — Ambulatory Visit (INDEPENDENT_AMBULATORY_CARE_PROVIDER_SITE_OTHER): Payer: No Typology Code available for payment source | Admitting: Plastic Surgery

## 2020-01-24 ENCOUNTER — Encounter: Payer: No Typology Code available for payment source | Admitting: Plastic Surgery

## 2020-01-24 VITALS — BP 113/78 | HR 96 | Temp 98.2°F

## 2020-01-24 DIAGNOSIS — C50411 Malignant neoplasm of upper-outer quadrant of right female breast: Secondary | ICD-10-CM | POA: Insufficient documentation

## 2020-01-24 DIAGNOSIS — C773 Secondary and unspecified malignant neoplasm of axilla and upper limb lymph nodes: Secondary | ICD-10-CM | POA: Insufficient documentation

## 2020-01-24 DIAGNOSIS — Z9889 Other specified postprocedural states: Secondary | ICD-10-CM

## 2020-01-24 DIAGNOSIS — Z170 Estrogen receptor positive status [ER+]: Secondary | ICD-10-CM | POA: Diagnosis not present

## 2020-01-24 DIAGNOSIS — Z510 Encounter for antineoplastic radiation therapy: Secondary | ICD-10-CM | POA: Diagnosis present

## 2020-01-24 NOTE — Progress Notes (Signed)
Patient is a 64 year old female here for follow-up after undergoing removal of right breast tissue expander with placement of implant, left breast mastopexy/reduction for symmetry, and removal of Port-A-Cath on 01/02/2020 with Dr. Marla Roe.  ~ 3 weeks PO Patient reports she is doing very well today.  Steri-Strips removed from right breast incision and lower portion of vertical limb.  All incisions are healing very nicely, C/D/I.  No signs of redness, drainage, seroma/hematoma, infection.  Some mild swelling present on the right breast.  Patient denies fever, chills, nausea/vomiting.  She will begin radiation next Tuesday.  These will consist of daily treatments for 6 weeks.  Patient will continue to do massage to right breast pressing down and inward slightly.  Follow-up appointment scheduled for 9/7 with Dr. Marla Roe.  Call office with any questions/concerns.  Pictures were obtained of the patient and placed in the chart with the patient's or guardian's permission.  The [place] was signed into law in 2016 which includes the topic of electronic health records.  This provides immediate access to information in MyChart.  This includes consultation notes, operative notes, office notes, lab results and pathology reports.  If you have any questions about what you read please let us know at your next visit or call us at the office.  We are right here with you.

## 2020-01-26 ENCOUNTER — Ambulatory Visit (HOSPITAL_COMMUNITY)
Admission: RE | Admit: 2020-01-26 | Discharge: 2020-01-26 | Disposition: A | Discharge status: Home / Self Care | Payer: No Typology Code available for payment source | Source: Ambulatory Visit | Attending: Hematology and Oncology | Admitting: Hematology and Oncology

## 2020-01-26 ENCOUNTER — Other Ambulatory Visit: Payer: Self-pay

## 2020-01-26 ENCOUNTER — Encounter: Payer: Self-pay | Admitting: *Deleted

## 2020-01-26 DIAGNOSIS — E785 Hyperlipidemia, unspecified: Secondary | ICD-10-CM | POA: Diagnosis not present

## 2020-01-26 DIAGNOSIS — Z01818 Encounter for other preprocedural examination: Secondary | ICD-10-CM | POA: Insufficient documentation

## 2020-01-26 DIAGNOSIS — Z170 Estrogen receptor positive status [ER+]: Secondary | ICD-10-CM | POA: Insufficient documentation

## 2020-01-26 DIAGNOSIS — C50411 Malignant neoplasm of upper-outer quadrant of right female breast: Secondary | ICD-10-CM | POA: Insufficient documentation

## 2020-01-26 LAB — ECHOCARDIOGRAM COMPLETE
Area-P 1/2: 3.27 cm2
S' Lateral: 3.05 cm

## 2020-01-26 NOTE — Progress Notes (Signed)
  Echocardiogram 2D Echocardiogram has been performed.  Wendy [name] 01/26/2020, 10:49 AM

## 2020-01-27 DIAGNOSIS — Z510 Encounter for antineoplastic radiation therapy: Secondary | ICD-10-CM | POA: Diagnosis not present

## 2020-01-31 ENCOUNTER — Other Ambulatory Visit: Payer: Self-pay

## 2020-01-31 ENCOUNTER — Ambulatory Visit
Admission: RE | Admit: 2020-01-31 | Discharge: 2020-01-31 | Disposition: A | Discharge status: Home / Self Care | Payer: No Typology Code available for payment source | Source: Ambulatory Visit | Attending: Radiation Oncology | Admitting: Radiation Oncology

## 2020-01-31 DIAGNOSIS — Z510 Encounter for antineoplastic radiation therapy: Secondary | ICD-10-CM | POA: Diagnosis not present

## 2020-02-01 ENCOUNTER — Ambulatory Visit
Admission: RE | Admit: 2020-02-01 | Discharge: 2020-02-01 | Disposition: A | Discharge status: Home / Self Care | Payer: No Typology Code available for payment source | Source: Ambulatory Visit | Attending: Radiation Oncology | Admitting: Radiation Oncology

## 2020-02-01 ENCOUNTER — Other Ambulatory Visit: Payer: Self-pay

## 2020-02-01 DIAGNOSIS — Z510 Encounter for antineoplastic radiation therapy: Secondary | ICD-10-CM | POA: Diagnosis not present

## 2020-02-02 ENCOUNTER — Ambulatory Visit
Admission: RE | Admit: 2020-02-02 | Discharge: 2020-02-02 | Disposition: A | Discharge status: Home / Self Care | Payer: No Typology Code available for payment source | Source: Ambulatory Visit | Attending: Radiation Oncology | Admitting: Radiation Oncology

## 2020-02-02 ENCOUNTER — Other Ambulatory Visit: Payer: Self-pay

## 2020-02-02 DIAGNOSIS — Z510 Encounter for antineoplastic radiation therapy: Secondary | ICD-10-CM | POA: Diagnosis not present

## 2020-02-03 ENCOUNTER — Telehealth: Payer: Self-pay | Admitting: *Deleted

## 2020-02-03 ENCOUNTER — Ambulatory Visit
Admission: RE | Admit: 2020-02-03 | Discharge: 2020-02-03 | Disposition: A | Discharge status: Home / Self Care | Payer: No Typology Code available for payment source | Source: Ambulatory Visit | Attending: Radiation Oncology | Admitting: Radiation Oncology

## 2020-02-03 ENCOUNTER — Other Ambulatory Visit: Payer: Self-pay

## 2020-02-03 DIAGNOSIS — Z510 Encounter for antineoplastic radiation therapy: Secondary | ICD-10-CM | POA: Diagnosis not present

## 2020-02-03 NOTE — Telephone Encounter (Signed)
This RN returned VM left pt the patient stating she is having noted pain in her " bone " in left thigh and knee - " really starts aching when I lay down at night to the point I have to take something for the pain " Pt states she is undergoing radiation therapy presently and " am in your office every day during the week "  This RN reviewed chart - noted pt has history of DDD per 2017 plain films of her back.  This RN return call to discuss further- obtained verified VM- message left discussing possible sciatica with stretching recommendations as well as use of cool compresses and NSAIDs.  This RN requested return call to discuss further and possible further evaluation or or need to be seen by PT.

## 2020-02-06 ENCOUNTER — Other Ambulatory Visit: Payer: Self-pay

## 2020-02-06 ENCOUNTER — Ambulatory Visit
Admission: RE | Admit: 2020-02-06 | Discharge: 2020-02-06 | Disposition: A | Discharge status: Home / Self Care | Payer: No Typology Code available for payment source | Source: Ambulatory Visit | Attending: Radiation Oncology | Admitting: Radiation Oncology

## 2020-02-06 DIAGNOSIS — Z9221 Personal history of antineoplastic chemotherapy: Secondary | ICD-10-CM | POA: Diagnosis not present

## 2020-02-06 DIAGNOSIS — Z510 Encounter for antineoplastic radiation therapy: Secondary | ICD-10-CM | POA: Insufficient documentation

## 2020-02-06 DIAGNOSIS — Z9011 Acquired absence of right breast and nipple: Secondary | ICD-10-CM | POA: Diagnosis not present

## 2020-02-06 DIAGNOSIS — Z170 Estrogen receptor positive status [ER+]: Secondary | ICD-10-CM | POA: Insufficient documentation

## 2020-02-06 DIAGNOSIS — C50411 Malignant neoplasm of upper-outer quadrant of right female breast: Secondary | ICD-10-CM | POA: Insufficient documentation

## 2020-02-06 DIAGNOSIS — R609 Edema, unspecified: Secondary | ICD-10-CM | POA: Diagnosis not present

## 2020-02-06 DIAGNOSIS — C773 Secondary and unspecified malignant neoplasm of axilla and upper limb lymph nodes: Secondary | ICD-10-CM | POA: Insufficient documentation

## 2020-02-07 ENCOUNTER — Ambulatory Visit
Admission: RE | Admit: 2020-02-07 | Discharge: 2020-02-07 | Disposition: A | Discharge status: Home / Self Care | Payer: No Typology Code available for payment source | Source: Ambulatory Visit | Attending: Radiation Oncology | Admitting: Radiation Oncology

## 2020-02-07 ENCOUNTER — Telehealth: Payer: Self-pay | Admitting: Hematology and Oncology

## 2020-02-07 ENCOUNTER — Telehealth: Payer: Self-pay

## 2020-02-07 ENCOUNTER — Other Ambulatory Visit: Payer: Self-pay

## 2020-02-07 ENCOUNTER — Encounter: Payer: Self-pay | Admitting: Plastic Surgery

## 2020-02-07 DIAGNOSIS — Z510 Encounter for antineoplastic radiation therapy: Secondary | ICD-10-CM | POA: Diagnosis not present

## 2020-02-07 NOTE — Telephone Encounter (Signed)
Scheduled apt per 8/2 sch msg - unable to reach pt - left message with appt date and time

## 2020-02-07 NOTE — Telephone Encounter (Signed)
Note Returned call to pt re: her concerns with the steri-strips still in place & whether she needs to continue wearing her sports bra I consulted with Venetia Night, [place]- she confirmed that if the steri-strips are not fully adhered to the incision she can remove them carefully- but if they are totally attached- she should not force them off Pt indicates that the steri-strips are securely adhered & she will call next week to make an appointment for someone in the office to remove them if needed.   Per Venetia Night- she should continue to wear the sports bra Pt does report that she is doing well & has minimal pain, no fever/chills or any other complications She will call for any changes/concerns Pt agrees with plan of care

## 2020-02-07 NOTE — Telephone Encounter (Signed)
Returned call to pt re: her concerns with the steri-strips still in place & whether she needs to continue wearing her sports bra I consulted with Venetia Night, [place]- she confirmed that if the steri-strips are not fully adhered to the incision she can remove them carefully- but if they are totally attached- she should not force them off Pt indicates that the steri-strips are securely adhered & she will call next week to make an appointment for someone in the office to remove them if needed.   Per Venetia Night- she should continue to wear the sports bra Pt does report that she is doing well & has minimal pain, no fever/chills or any other complications She will call for any changes/concerns Pt agrees with plan of care

## 2020-02-08 ENCOUNTER — Other Ambulatory Visit: Payer: Self-pay

## 2020-02-08 ENCOUNTER — Ambulatory Visit
Admission: RE | Admit: 2020-02-08 | Discharge: 2020-02-08 | Disposition: A | Discharge status: Home / Self Care | Payer: No Typology Code available for payment source | Source: Ambulatory Visit | Attending: Radiation Oncology | Admitting: Radiation Oncology

## 2020-02-08 DIAGNOSIS — Z510 Encounter for antineoplastic radiation therapy: Secondary | ICD-10-CM | POA: Diagnosis not present

## 2020-02-09 ENCOUNTER — Other Ambulatory Visit: Payer: Self-pay

## 2020-02-09 ENCOUNTER — Ambulatory Visit
Admission: RE | Admit: 2020-02-09 | Discharge: 2020-02-09 | Disposition: A | Discharge status: Home / Self Care | Payer: No Typology Code available for payment source | Source: Ambulatory Visit | Attending: Radiation Oncology | Admitting: Radiation Oncology

## 2020-02-09 DIAGNOSIS — Z510 Encounter for antineoplastic radiation therapy: Secondary | ICD-10-CM | POA: Diagnosis not present

## 2020-02-10 ENCOUNTER — Ambulatory Visit
Admission: RE | Admit: 2020-02-10 | Discharge: 2020-02-10 | Disposition: A | Discharge status: Home / Self Care | Payer: No Typology Code available for payment source | Source: Ambulatory Visit | Attending: Radiation Oncology | Admitting: Radiation Oncology

## 2020-02-10 DIAGNOSIS — Z510 Encounter for antineoplastic radiation therapy: Secondary | ICD-10-CM | POA: Diagnosis not present

## 2020-02-13 ENCOUNTER — Other Ambulatory Visit: Payer: Self-pay

## 2020-02-13 ENCOUNTER — Ambulatory Visit
Admission: RE | Admit: 2020-02-13 | Discharge: 2020-02-13 | Disposition: A | Discharge status: Home / Self Care | Payer: No Typology Code available for payment source | Source: Ambulatory Visit | Attending: Radiation Oncology | Admitting: Radiation Oncology

## 2020-02-13 DIAGNOSIS — Z510 Encounter for antineoplastic radiation therapy: Secondary | ICD-10-CM | POA: Diagnosis not present

## 2020-02-14 ENCOUNTER — Other Ambulatory Visit: Payer: Self-pay

## 2020-02-14 ENCOUNTER — Ambulatory Visit
Admission: RE | Admit: 2020-02-14 | Discharge: 2020-02-14 | Disposition: A | Discharge status: Home / Self Care | Payer: No Typology Code available for payment source | Source: Ambulatory Visit | Attending: Radiation Oncology | Admitting: Radiation Oncology

## 2020-02-14 DIAGNOSIS — Z510 Encounter for antineoplastic radiation therapy: Secondary | ICD-10-CM | POA: Diagnosis not present

## 2020-02-14 NOTE — Progress Notes (Signed)
Patient Care Team: [name], MD as PCP - General (Family Medicine) Mauro Kaufmann, RN as Oncology Nurse Navigator Rockwell Germany, RN as Oncology Nurse Navigator  DIAGNOSIS:    ICD-10-CM   1. Malignant neoplasm of upper-outer quadrant of right breast in female, estrogen receptor positive ([place])  C50.411    Z17.0     SUMMARY OF ONCOLOGIC HISTORY: Oncology History  Malignant neoplasm of upper-outer quadrant of right breast in female, estrogen receptor positive ([place])  05/20/2019 Initial Diagnosis   Palpable right breast mass superficial, ultrasound 11 o'clock position 1.7 cm: Biopsy revealed grade 3 IDC with DCIS ER 100%, PR 30%, Ki-67 20%, HER-2 by IHC negative, axilla lymph node biopsy positive   06/01/2019 Cancer Staging   Staging form: Breast, AJCC 8th Edition - Clinical stage from 06/01/2019: Stage IIA (cT1c, cN1, cM0, G3, ER+, PR+, HER2-) - Signed by Nicholas Lose, MD on 06/01/2019   06/27/2019 Surgery   Right lumpectomy [name]): IDC, grade 3, at least 2.5cm, involving a complex sclerosing lesion, 1.1cm, with high grade DCIS, involved margins, 1/3 right axillary lymph nodes positive, 1.4cm.    07/11/2019 Cancer Staging   Staging form: Breast, AJCC 8th Edition - Pathologic stage from 07/11/2019: Stage IIA (pT2, pN1a, cM0, G3, ER+, PR+, HER2-) - Signed by Nicholas Lose, MD on 07/11/2019   07/19/2019 Oncotype testing   Mammaprint: high risk, 29% chance of recurrence in 10 years without systemic treatment.    09/08/2019 Surgery   Re-excision x2 (08/01/19 and 08/12/19) followed by right mastectomy [name]): residual carcinoma and DCIS, 1/5 lymph nodes positive, clear margins.    10/14/2019 - 12/15/2019 Chemotherapy   The patient had dexamethasone (DECADRON) 4 MG tablet, 4 mg (100 % of original dose 4 mg), Oral, Daily, 1 of 1 cycle, Start date: 07/22/2019, End date: 11/24/2019 Dose modification: 4 mg (original dose 4 mg, Cycle 0) palonosetron (ALOXI) injection 0.25 mg, 0.25 mg,  Intravenous,  Once, 4 of 4 cycles Administration: 0.25 mg (10/14/2019), 0.25 mg (11/04/2019), 0.25 mg (11/24/2019), 0.25 mg (12/15/2019) pegfilgrastim (NEULASTA ONPRO KIT) injection 6 mg, 6 mg, Subcutaneous, Once, 4 of 4 cycles Administration: 6 mg (10/14/2019), 6 mg (11/04/2019), 6 mg (11/24/2019), 6 mg (12/15/2019) cyclophosphamide (CYTOXAN) 1,260 mg in sodium chloride 0.9 % 250 mL chemo infusion, 600 mg/m2 = 1,260 mg, Intravenous,  Once, 4 of 4 cycles Dose modification: 500 mg/m2 (original dose 600 mg/m2, Cycle 2, Reason: Dose not tolerated) Administration: 1,260 mg (10/14/2019), 1,040 mg (11/04/2019), 1,040 mg (11/24/2019), 1,040 mg (12/15/2019) DOCEtaxel (TAXOTERE) 160 mg in sodium chloride 0.9 % 250 mL chemo infusion, 75 mg/m2 = 160 mg, Intravenous,  Once, 4 of 4 cycles Dose modification: 65 mg/m2 (original dose 75 mg/m2, Cycle 2, Reason: Dose not tolerated) Administration: 160 mg (10/14/2019), 140 mg (11/04/2019), 140 mg (11/24/2019), 140 mg (12/15/2019)  for chemotherapy treatment.      CHIEF COMPLIANT: Follow-up to discuss antiestrogen therapy   INTERVAL HISTORY: Wendy [name] is a 64 y.o. with above-mentioned history of right breast cancer who underwent a lumpectomy followed by re-excision and then a mastectomy, adjuvant chemotherapy, and is currently on radiation treatment.She presents to the clinic todayto discuss antiestrogen therapy.  The leg swelling is slightly better.  She is currently on Lasix 20 mg daily although she has not taken it over the past week.  ALLERGIES:  is allergic to hydrocodone-acetaminophen, oxycodone, other, vicodin [hydrocodone-acetaminophen], and brimonidine.  MEDICATIONS:  Current Outpatient Medications  Medication Sig Dispense Refill  . diazepam (VALIUM) 2 MG  tablet Take 1 tablet (2 mg total) by mouth every 12 (twelve) hours as needed for muscle spasms. (Patient not taking: Reported on 01/10/2020) 20 tablet 0  . dorzolamide-timolol (COSOPT) 22.3-6.8 MG/ML ophthalmic  solution PLACE 1 DROP INTO BOTH EYES 2 (TWO) TIMES DAILY.    . famotidine (PEPCID) 20 MG tablet Take 20 mg by mouth 2 (two) times daily.    . furosemide (LASIX) 20 MG tablet Take 1 tablet (20 mg total) by mouth 2 (two) times daily. 60 tablet 1  . latanoprost (XALATAN) 0.005 % ophthalmic solution 1 drop at bedtime.     . magic mouthwash w/lidocaine SOLN Take 5 mLs by mouth 4 (four) times daily as needed for mouth pain. 240 mL 2  . potassium chloride (MICRO-K) 10 MEQ CR capsule Take 1 capsule (10 mEq total) by mouth 2 (two) times daily. 30 capsule 0  . predniSONE (DELTASONE) 10 MG tablet Take 1 tablet (10 mg total) by mouth as directed. Taper 6,5,4,3,2,1 (Patient not taking: Reported on 01/10/2020) 21 tablet 0  . triamcinolone ointment (KENALOG) 0.5 % Apply 1 application topically 2 (two) times daily. 30 g 0   No current facility-administered medications for this visit.   Facility-Administered Medications Ordered in Other Visits  Medication Dose Route Frequency Provider Last Rate Last Admin  . heparin lock flush 100 unit/mL  500 Units Intravenous Once Nicholas Lose, MD      . sodium chloride flush (NS) 0.9 % injection 10 mL  10 mL Intravenous PRN Nicholas Lose, MD   10 mL at 10/14/19 1032    PHYSICAL EXAMINATION: ECOG PERFORMANCE STATUS: 1 - Symptomatic but completely ambulatory  There were no vitals filed for this visit. There were no vitals filed for this visit.  BREAST: No palpable masses or nodules in either right or left breasts. No palpable axillary supraclavicular or infraclavicular adenopathy no breast tenderness or nipple discharge. (exam performed in the presence of a chaperone)  LABORATORY DATA:  I have reviewed the data as listed CMP Latest Ref Rng & Units 01/18/2020 12/15/2019 11/24/2019  Glucose 70 - 99 mg/dL 95 121(H) 130(H)  BUN 8 - 23 mg/dL 10 7(L) 9  Creatinine 0.44 - 1.00 mg/dL 0.69 0.65 0.69  Sodium 135 - 145 mmol/L 144 140 141  Potassium 3.5 - 5.1 mmol/L 4.0 3.7 3.8   Chloride 98 - 111 mmol/L 111 111 111  CO2 22 - 32 mmol/L '24 23 23  ' Calcium 8.9 - 10.3 mg/dL 8.8(L) 8.6(L) 9.2  Total Protein 6.5 - 8.1 g/dL 6.1(L) 5.8(L) 6.7  Total Bilirubin 0.3 - 1.2 mg/dL 0.7 0.5 0.4  Alkaline Phos 38 - 126 U/L 56 58 69  AST 15 - 41 U/L '19 16 17  ' ALT 0 - 44 U/L '11 11 13    ' Lab Results  Component Value Date   WBC 4.4 01/18/2020   HGB 12.2 01/18/2020   HCT 39.7 01/18/2020   MCV 93.0 01/18/2020   PLT 168 01/18/2020   NEUTROABS 2.3 01/18/2020    ASSESSMENT & PLAN:  Malignant neoplasm of upper-outer quadrant of right breast in female, estrogen receptor positive ([place]) 06/27/2019:Right lumpectomy [name]): IDC, grade 3, at least 2.5cm, involving a complex sclerosing lesion, 1.1cm, with high grade DCIS, involved margins, 1/3 right axillary lymph nodes positive, 1.4cm.ER 100%, PR 30%, Ki-67 20%, HER-2 IHC negative T2 N1 stage IIA MammaPrint: High risk, 10-year risk of recurrence untreated: 29% 09/08/2019:Re-excision x2 (08/01/19 and 08/12/19) followed by right mastectomy [name]): residual carcinoma and DCIS, 1/5 lymph nodes  positive, clear margins.  Treatment plan: 1.Adjuvant chemotherapy withTaxotere and Cytoxan every 3 weeks x4 completed 12/15/2019 2.Adjuvant radiation therapy to be completed 03/16/2020 3.Follow-up adjuvant antiestrogen therapywith anastrozole 1 mg daily x7 years Genetic testing ------------------------------------------------------------------------------------------------------ Treatment plan: Plan for adjuvant antiestrogen therapy with anastrozole x7 years after radiation is complete  Bilateral lower extremity swelling:  01/26/2020: Echocardiogram EF 60 to 65% 01/21/2020: Ultrasound lower extremity: No DVT Liver and kidney function are also adequate. I suspect the lower extremity edema is primarily related to hypoalbuminemia. We discussed the importance of adding 30 g of protein daily to her diet. She will continue with Lasix 20 mg daily.   The leg edema is slightly better. Patient is requesting a sleeve and we will request a physical therapy to give [place] the dimensions and pressure I instructed her to wear stockings to keep the legs from swelling more.  We will see her back on 03/16/2020 on the last day of radiation with lab work.   No orders of the defined types were placed in this encounter.  The patient has a good understanding of the overall plan. she agrees with it. she will call with any problems that may develop before the next visit here.  Total time spent: 30 mins including face to face time and time spent for planning, charting and coordination of care  Nicholas Lose, MD 02/15/2020  I, Cloyde Reams Dorshimer, am acting as scribe for Dr. Nicholas Lose.  I have reviewed the above documentation for accuracy and completeness, and I agree with the above.

## 2020-02-15 ENCOUNTER — Inpatient Hospital Stay
Payer: No Typology Code available for payment source | Attending: Hematology and Oncology | Admitting: Hematology and Oncology

## 2020-02-15 ENCOUNTER — Other Ambulatory Visit: Payer: Self-pay | Admitting: *Deleted

## 2020-02-15 ENCOUNTER — Other Ambulatory Visit: Payer: Self-pay

## 2020-02-15 ENCOUNTER — Ambulatory Visit
Admission: RE | Admit: 2020-02-15 | Discharge: 2020-02-15 | Disposition: A | Discharge status: Home / Self Care | Payer: No Typology Code available for payment source | Source: Ambulatory Visit | Attending: Radiation Oncology | Admitting: Radiation Oncology

## 2020-02-15 DIAGNOSIS — Z510 Encounter for antineoplastic radiation therapy: Secondary | ICD-10-CM | POA: Diagnosis not present

## 2020-02-15 DIAGNOSIS — C50411 Malignant neoplasm of upper-outer quadrant of right female breast: Secondary | ICD-10-CM

## 2020-02-15 DIAGNOSIS — Z170 Estrogen receptor positive status [ER+]: Secondary | ICD-10-CM

## 2020-02-15 NOTE — Assessment & Plan Note (Signed)
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  Treatment plan: 1.Adjuvant chemotherapy withTaxotere and Cytoxan every 3 weeks x4 completed 12/15/2019 2.Adjuvant radiation therapy 3.Follow-up adjuvant antiestrogen therapywith anastrozole 1 mg daily x7 years Genetic testing ------------------------------------------------------------------------------------------------------ Treatment plan: Plan for adjuvant antiestrogen therapy with anastrozole x7 years after radiation is complete  Bilateral lower extremity swelling:  01/26/2020: Echocardiogram EF 60 to 65% 01/21/2020: Ultrasound lower extremity: No DVT Liver and kidney function are also adequate.

## 2020-02-16 ENCOUNTER — Ambulatory Visit
Admission: RE | Admit: 2020-02-16 | Discharge: 2020-02-16 | Disposition: A | Discharge status: Home / Self Care | Payer: No Typology Code available for payment source | Source: Ambulatory Visit | Attending: Radiation Oncology | Admitting: Radiation Oncology

## 2020-02-16 ENCOUNTER — Other Ambulatory Visit: Payer: Self-pay

## 2020-02-16 DIAGNOSIS — Z510 Encounter for antineoplastic radiation therapy: Secondary | ICD-10-CM | POA: Diagnosis not present

## 2020-02-17 ENCOUNTER — Telehealth: Payer: Self-pay | Admitting: Hematology and Oncology

## 2020-02-17 ENCOUNTER — Ambulatory Visit
Admission: RE | Admit: 2020-02-17 | Discharge: 2020-02-17 | Disposition: A | Discharge status: Home / Self Care | Payer: No Typology Code available for payment source | Source: Ambulatory Visit | Attending: Radiation Oncology | Admitting: Radiation Oncology

## 2020-02-17 ENCOUNTER — Other Ambulatory Visit: Payer: Self-pay

## 2020-02-17 DIAGNOSIS — Z510 Encounter for antineoplastic radiation therapy: Secondary | ICD-10-CM | POA: Diagnosis not present

## 2020-02-17 NOTE — Telephone Encounter (Signed)
Scheduled per 8/11 los. Called and left a msg, mailing out appt letter and calendar printout

## 2020-02-20 ENCOUNTER — Ambulatory Visit
Admission: RE | Admit: 2020-02-20 | Discharge: 2020-02-20 | Disposition: A | Discharge status: Home / Self Care | Payer: No Typology Code available for payment source | Source: Ambulatory Visit | Attending: Radiation Oncology | Admitting: Radiation Oncology

## 2020-02-20 ENCOUNTER — Other Ambulatory Visit: Payer: Self-pay

## 2020-02-20 DIAGNOSIS — Z510 Encounter for antineoplastic radiation therapy: Secondary | ICD-10-CM | POA: Diagnosis not present

## 2020-02-21 ENCOUNTER — Ambulatory Visit
Admission: RE | Admit: 2020-02-21 | Discharge: 2020-02-21 | Disposition: A | Discharge status: Home / Self Care | Payer: No Typology Code available for payment source | Source: Ambulatory Visit | Attending: Radiation Oncology | Admitting: Radiation Oncology

## 2020-02-21 DIAGNOSIS — Z510 Encounter for antineoplastic radiation therapy: Secondary | ICD-10-CM | POA: Diagnosis not present

## 2020-02-22 ENCOUNTER — Ambulatory Visit
Admission: RE | Admit: 2020-02-22 | Discharge: 2020-02-22 | Disposition: A | Discharge status: Home / Self Care | Payer: No Typology Code available for payment source | Source: Ambulatory Visit | Attending: Radiation Oncology | Admitting: Radiation Oncology

## 2020-02-22 ENCOUNTER — Other Ambulatory Visit: Payer: Self-pay

## 2020-02-22 DIAGNOSIS — Z510 Encounter for antineoplastic radiation therapy: Secondary | ICD-10-CM | POA: Diagnosis not present

## 2020-02-23 ENCOUNTER — Other Ambulatory Visit: Payer: Self-pay

## 2020-02-23 ENCOUNTER — Ambulatory Visit
Admission: RE | Admit: 2020-02-23 | Discharge: 2020-02-23 | Disposition: A | Discharge status: Home / Self Care | Payer: No Typology Code available for payment source | Source: Ambulatory Visit | Attending: Radiation Oncology | Admitting: Radiation Oncology

## 2020-02-23 DIAGNOSIS — Z510 Encounter for antineoplastic radiation therapy: Secondary | ICD-10-CM | POA: Diagnosis not present

## 2020-02-24 ENCOUNTER — Ambulatory Visit
Admission: RE | Admit: 2020-02-24 | Discharge: 2020-02-24 | Disposition: A | Discharge status: Home / Self Care | Payer: No Typology Code available for payment source | Source: Ambulatory Visit | Attending: Radiation Oncology | Admitting: Radiation Oncology

## 2020-02-24 DIAGNOSIS — Z510 Encounter for antineoplastic radiation therapy: Secondary | ICD-10-CM | POA: Diagnosis not present

## 2020-02-27 ENCOUNTER — Ambulatory Visit
Admission: RE | Admit: 2020-02-27 | Discharge: 2020-02-27 | Disposition: A | Discharge status: Home / Self Care | Payer: No Typology Code available for payment source | Source: Ambulatory Visit | Attending: Radiation Oncology | Admitting: Radiation Oncology

## 2020-02-27 DIAGNOSIS — Z510 Encounter for antineoplastic radiation therapy: Secondary | ICD-10-CM | POA: Diagnosis not present

## 2020-02-28 ENCOUNTER — Ambulatory Visit: Payer: No Typology Code available for payment source

## 2020-02-28 ENCOUNTER — Encounter: Payer: Self-pay | Admitting: Radiation Oncology

## 2020-02-28 DIAGNOSIS — Z510 Encounter for antineoplastic radiation therapy: Secondary | ICD-10-CM | POA: Diagnosis not present

## 2020-02-29 ENCOUNTER — Other Ambulatory Visit: Payer: Self-pay

## 2020-02-29 ENCOUNTER — Ambulatory Visit
Admission: RE | Admit: 2020-02-29 | Discharge: 2020-02-29 | Disposition: A | Discharge status: Home / Self Care | Payer: No Typology Code available for payment source | Source: Ambulatory Visit | Attending: Radiation Oncology | Admitting: Radiation Oncology

## 2020-02-29 DIAGNOSIS — Z510 Encounter for antineoplastic radiation therapy: Secondary | ICD-10-CM | POA: Diagnosis not present

## 2020-03-01 ENCOUNTER — Other Ambulatory Visit: Payer: Self-pay

## 2020-03-01 ENCOUNTER — Ambulatory Visit
Admission: RE | Admit: 2020-03-01 | Discharge: 2020-03-01 | Disposition: A | Discharge status: Home / Self Care | Payer: No Typology Code available for payment source | Source: Ambulatory Visit | Attending: Radiation Oncology | Admitting: Radiation Oncology

## 2020-03-01 DIAGNOSIS — Z510 Encounter for antineoplastic radiation therapy: Secondary | ICD-10-CM | POA: Diagnosis not present

## 2020-03-02 ENCOUNTER — Ambulatory Visit
Admission: RE | Admit: 2020-03-02 | Discharge: 2020-03-02 | Disposition: A | Discharge status: Home / Self Care | Payer: No Typology Code available for payment source | Source: Ambulatory Visit | Attending: Radiation Oncology | Admitting: Radiation Oncology

## 2020-03-02 ENCOUNTER — Ambulatory Visit: Payer: No Typology Code available for payment source | Admitting: Radiation Oncology

## 2020-03-02 DIAGNOSIS — Z170 Estrogen receptor positive status [ER+]: Secondary | ICD-10-CM

## 2020-03-02 DIAGNOSIS — Z510 Encounter for antineoplastic radiation therapy: Secondary | ICD-10-CM | POA: Diagnosis not present

## 2020-03-02 DIAGNOSIS — C50411 Malignant neoplasm of upper-outer quadrant of right female breast: Secondary | ICD-10-CM

## 2020-03-02 MED ORDER — SONAFINE EX EMUL
1.0000 "application " | Freq: Once | CUTANEOUS | Status: AC
  Administered 2020-03-02: 1 via TOPICAL

## 2020-03-05 ENCOUNTER — Other Ambulatory Visit: Payer: Self-pay

## 2020-03-05 ENCOUNTER — Ambulatory Visit
Admission: RE | Admit: 2020-03-05 | Discharge: 2020-03-05 | Disposition: A | Discharge status: Home / Self Care | Payer: No Typology Code available for payment source | Source: Ambulatory Visit | Attending: Radiation Oncology | Admitting: Radiation Oncology

## 2020-03-05 ENCOUNTER — Other Ambulatory Visit: Payer: Self-pay | Admitting: Radiation Oncology

## 2020-03-05 DIAGNOSIS — Z510 Encounter for antineoplastic radiation therapy: Secondary | ICD-10-CM | POA: Diagnosis not present

## 2020-03-05 MED ORDER — ACETAMINOPHEN-CODEINE #3 300-30 MG PO TABS: 1.0000 | ORAL_TABLET | Freq: Four times a day (QID) | ORAL | 0 refills | Status: DC | PRN

## 2020-03-05 NOTE — Progress Notes (Signed)
I saw the patient at the treatment machine and she was complaining of pain in her right axilla. She is working with PT to get a custom compression garment for her right upper extremity edema since surgery. On exam she has hyperpigmentation without skin breakdown. She has trace edema of her RUE. We discussed the symptoms are likely multifactorial but she could try dermaplast spray and was interested in trying tylenol #3 since she can tolerate this over other pain medications due to itching/hives. A new rx was sent into her pharmacy.

## 2020-03-06 ENCOUNTER — Other Ambulatory Visit: Payer: Self-pay

## 2020-03-06 ENCOUNTER — Ambulatory Visit
Admission: RE | Admit: 2020-03-06 | Discharge: 2020-03-06 | Disposition: A | Discharge status: Home / Self Care | Payer: No Typology Code available for payment source | Source: Ambulatory Visit | Attending: Radiation Oncology | Admitting: Radiation Oncology

## 2020-03-06 DIAGNOSIS — Z510 Encounter for antineoplastic radiation therapy: Secondary | ICD-10-CM | POA: Diagnosis not present

## 2020-03-07 ENCOUNTER — Telehealth: Payer: Self-pay | Admitting: Hematology and Oncology

## 2020-03-07 ENCOUNTER — Ambulatory Visit
Admission: RE | Admit: 2020-03-07 | Discharge: 2020-03-07 | Disposition: A | Discharge status: Home / Self Care | Payer: No Typology Code available for payment source | Source: Ambulatory Visit | Attending: Radiation Oncology | Admitting: Radiation Oncology

## 2020-03-07 DIAGNOSIS — Z510 Encounter for antineoplastic radiation therapy: Secondary | ICD-10-CM | POA: Insufficient documentation

## 2020-03-07 DIAGNOSIS — C773 Secondary and unspecified malignant neoplasm of axilla and upper limb lymph nodes: Secondary | ICD-10-CM | POA: Diagnosis present

## 2020-03-07 DIAGNOSIS — C50411 Malignant neoplasm of upper-outer quadrant of right female breast: Secondary | ICD-10-CM | POA: Insufficient documentation

## 2020-03-07 DIAGNOSIS — Z170 Estrogen receptor positive status [ER+]: Secondary | ICD-10-CM | POA: Insufficient documentation

## 2020-03-07 NOTE — Telephone Encounter (Signed)
Rescheduled per 9/1 staff msg. Called and left a msg and mailing appt letter and calendar printout

## 2020-03-08 ENCOUNTER — Ambulatory Visit
Admission: RE | Admit: 2020-03-08 | Discharge: 2020-03-08 | Disposition: A | Discharge status: Home / Self Care | Payer: No Typology Code available for payment source | Source: Ambulatory Visit | Attending: Radiation Oncology | Admitting: Radiation Oncology

## 2020-03-08 DIAGNOSIS — C50411 Malignant neoplasm of upper-outer quadrant of right female breast: Secondary | ICD-10-CM | POA: Diagnosis not present

## 2020-03-09 ENCOUNTER — Ambulatory Visit
Admission: RE | Admit: 2020-03-09 | Discharge: 2020-03-09 | Disposition: A | Discharge status: Home / Self Care | Payer: No Typology Code available for payment source | Source: Ambulatory Visit | Attending: Radiation Oncology | Admitting: Radiation Oncology

## 2020-03-09 ENCOUNTER — Ambulatory Visit: Payer: No Typology Code available for payment source

## 2020-03-09 ENCOUNTER — Other Ambulatory Visit: Payer: Self-pay

## 2020-03-09 DIAGNOSIS — C50411 Malignant neoplasm of upper-outer quadrant of right female breast: Secondary | ICD-10-CM | POA: Diagnosis not present

## 2020-03-12 ENCOUNTER — Encounter: Payer: Self-pay | Admitting: Plastic Surgery

## 2020-03-12 NOTE — Progress Notes (Signed)
   Subjective:    Patient ID: Wendy [name], female    DOB: 1955-08-03, 63 y.o.   MRN: [id number]  The patient is a 64 year old female here for follow-up on her breast reconstruction after treatment for breast cancer.  On June 28 the patient underwent exchange of her right breast expander for an implant.  Her Port-A-Cath was removed and she had a left mastopexy reduction for improved symmetry.  She has been undergoing daily radiation on the right breast.  She has about a week left.  Last week she noticed a raw and blistering area in her right axilla.  Over the weekend the skin peeled off and it is very tender and red.  Nothing appears to be infected.  It does appear to be a radiation burn.  She has been using Silvadene on the area.  She has a call into radiation oncology to see if maybe she should not get a boost or even possibly stopping the radiation.  She has a little bit of limitation in her range of motion on the right arm.  The incision is staying closed and there does not appear to be any issue with the implant at this time.     Review of Systems  Constitutional: Positive for activity change. Negative for appetite change.  Eyes: Negative.   Respiratory: Negative.   Gastrointestinal: Negative.   Genitourinary: Negative.   Musculoskeletal: Negative.   Skin: Positive for color change and wound.  Psychiatric/Behavioral: Negative.        Objective:   Physical Exam Vitals and nursing note reviewed.  Constitutional:      Appearance: Normal appearance.  HENT:     Head: Normocephalic and atraumatic.  Cardiovascular:     Rate and Rhythm: Normal rate.     Pulses: Normal pulses.  Pulmonary:     Effort: Pulmonary effort is normal.  Neurological:     General: No focal deficit present.     Mental Status: She is alert and oriented to person, place, and time.  Psychiatric:        Mood and Affect: Mood normal.        Behavior: Behavior normal.        Thought Content: Thought content  normal.        Assessment & Plan:     ICD-10-CM   1. Malignant neoplasm of upper-outer quadrant of right breast in female, estrogen receptor positive ([place])  C50.411    Z17.0   2. Acquired absence of right breast  Z90.11   3. S/P mastectomy, right  Z90.11     Continue with the Silvadene cream to the right axilla.  Call with any worsening of the area.  We may need to initiate physical therapy for range of motion of that right arm if it remains stiff.  I would like to see her back in 3 months.  Pictures were obtained of the patient and placed in the chart with the patient's or guardian's permission.

## 2020-03-13 ENCOUNTER — Encounter: Payer: Self-pay | Admitting: Radiation Oncology

## 2020-03-13 ENCOUNTER — Ambulatory Visit: Payer: No Typology Code available for payment source

## 2020-03-13 ENCOUNTER — Ambulatory Visit
Admission: RE | Admit: 2020-03-13 | Discharge: 2020-03-13 | Disposition: A | Discharge status: Home / Self Care | Payer: No Typology Code available for payment source | Source: Ambulatory Visit | Attending: Radiation Oncology | Admitting: Radiation Oncology

## 2020-03-13 ENCOUNTER — Encounter: Payer: Self-pay | Admitting: Plastic Surgery

## 2020-03-13 ENCOUNTER — Ambulatory Visit (INDEPENDENT_AMBULATORY_CARE_PROVIDER_SITE_OTHER): Payer: No Typology Code available for payment source | Admitting: Plastic Surgery

## 2020-03-13 ENCOUNTER — Other Ambulatory Visit: Payer: Self-pay

## 2020-03-13 VITALS — BP 110/76 | HR 91 | Temp 98.3°F

## 2020-03-13 DIAGNOSIS — Z170 Estrogen receptor positive status [ER+]: Secondary | ICD-10-CM

## 2020-03-13 DIAGNOSIS — C50411 Malignant neoplasm of upper-outer quadrant of right female breast: Secondary | ICD-10-CM

## 2020-03-13 DIAGNOSIS — Z9011 Acquired absence of right breast and nipple: Secondary | ICD-10-CM

## 2020-03-13 NOTE — Progress Notes (Signed)
I saw the patient again today to discuss her skin concerns.  She has 4 fractions left of her boost for treatment of right breast cancer to the chest wall and regional nodal stations.  She has developed progressive desquamation in the axillary area on the right, as well as some hyperpigmentation since her last check in in the right supraclavicular region.  She is taking Tylenol 3 for pain relief and is done well with this without itching.  She is using Silvadene to the axilla, and sonafine to the breast.  We discussed the rationale for hydrogel pads and discussed how to apply these in for her to continue the skin care in the other areas but alternating the axilla with Silvadene and hydrogel pad.  She is encouraged to continue her pain medication as needed, but was encouraged that things appear to be healing well despite the desquamation that is present.  Cell islands were identified in the region, and we will continue to follow this area.  Her supraclavicular area is hyperpigmented but not open.  We will follow her throughout the course of the remainder of this week, and she is encouraged to let us know if she needs any further assessment before the end of the week.    Carola Rhine, PAC

## 2020-03-14 ENCOUNTER — Ambulatory Visit
Admission: RE | Admit: 2020-03-14 | Discharge: 2020-03-14 | Disposition: A | Discharge status: Home / Self Care | Payer: No Typology Code available for payment source | Source: Ambulatory Visit | Attending: Radiation Oncology | Admitting: Radiation Oncology

## 2020-03-14 DIAGNOSIS — C50411 Malignant neoplasm of upper-outer quadrant of right female breast: Secondary | ICD-10-CM | POA: Diagnosis not present

## 2020-03-15 ENCOUNTER — Ambulatory Visit
Admission: RE | Admit: 2020-03-15 | Discharge: 2020-03-15 | Disposition: A | Discharge status: Home / Self Care | Payer: No Typology Code available for payment source | Source: Ambulatory Visit | Attending: Radiation Oncology | Admitting: Radiation Oncology

## 2020-03-15 ENCOUNTER — Encounter: Payer: Self-pay | Admitting: *Deleted

## 2020-03-15 DIAGNOSIS — C50411 Malignant neoplasm of upper-outer quadrant of right female breast: Secondary | ICD-10-CM | POA: Diagnosis not present

## 2020-03-16 ENCOUNTER — Encounter: Payer: Self-pay | Admitting: Radiation Oncology

## 2020-03-16 ENCOUNTER — Ambulatory Visit
Admission: RE | Admit: 2020-03-16 | Discharge: 2020-03-16 | Disposition: A | Discharge status: Home / Self Care | Payer: No Typology Code available for payment source | Source: Ambulatory Visit | Attending: Radiation Oncology | Admitting: Radiation Oncology

## 2020-03-16 ENCOUNTER — Ambulatory Visit: Payer: No Typology Code available for payment source

## 2020-03-16 ENCOUNTER — Ambulatory Visit: Payer: No Typology Code available for payment source | Admitting: Hematology and Oncology

## 2020-03-16 ENCOUNTER — Other Ambulatory Visit: Payer: No Typology Code available for payment source

## 2020-03-16 DIAGNOSIS — C50411 Malignant neoplasm of upper-outer quadrant of right female breast: Secondary | ICD-10-CM | POA: Diagnosis not present

## 2020-03-16 DIAGNOSIS — Z170 Estrogen receptor positive status [ER+]: Secondary | ICD-10-CM

## 2020-03-16 MED ORDER — SILVER SULFADIAZINE 1 % EX CREA: TOPICAL_CREAM | Freq: Every day | CUTANEOUS | Status: DC

## 2020-03-16 MED ORDER — SONAFINE EX EMUL
1.0000 "application " | Freq: Two times a day (BID) | CUTANEOUS | Status: DC
  Administered 2020-03-16: 1 via TOPICAL

## 2020-03-19 ENCOUNTER — Ambulatory Visit: Payer: No Typology Code available for payment source

## 2020-03-20 ENCOUNTER — Ambulatory Visit: Payer: No Typology Code available for payment source

## 2020-03-21 ENCOUNTER — Other Ambulatory Visit: Payer: Self-pay | Admitting: Hematology and Oncology

## 2020-03-22 ENCOUNTER — Ambulatory Visit: Payer: No Typology Code available for payment source | Admitting: Hematology and Oncology

## 2020-03-22 ENCOUNTER — Other Ambulatory Visit: Payer: No Typology Code available for payment source

## 2020-03-23 ENCOUNTER — Ambulatory Visit: Payer: No Typology Code available for payment source

## 2020-03-26 NOTE — Progress Notes (Signed)
Patient Care Team: [name], MD as PCP - General (Family Medicine) Mauro Kaufmann, RN as Oncology Nurse Navigator Rockwell Germany, RN as Oncology Nurse Navigator  DIAGNOSIS:    ICD-10-CM   1. Malignant neoplasm of upper-outer quadrant of right breast in female, estrogen receptor positive ([place])  C50.411    Z17.0     SUMMARY OF ONCOLOGIC HISTORY: Oncology History  Malignant neoplasm of upper-outer quadrant of right breast in female, estrogen receptor positive ([place])  05/20/2019 Initial Diagnosis   Palpable right breast mass superficial, ultrasound 11 o'clock position 1.7 cm: Biopsy revealed grade 3 IDC with DCIS ER 100%, PR 30%, Ki-67 20%, HER-2 by IHC negative, axilla lymph node biopsy positive   06/01/2019 Cancer Staging   Staging form: Breast, AJCC 8th Edition - Clinical stage from 06/01/2019: Stage IIA (cT1c, cN1, cM0, G3, ER+, PR+, HER2-) - Signed by Nicholas Lose, MD on 06/01/2019   06/27/2019 Surgery   Right lumpectomy [name]): IDC, grade 3, at least 2.5cm, involving a complex sclerosing lesion, 1.1cm, with high grade DCIS, involved margins, 1/3 right axillary lymph nodes positive, 1.4cm.    07/11/2019 Cancer Staging   Staging form: Breast, AJCC 8th Edition - Pathologic stage from 07/11/2019: Stage IIA (pT2, pN1a, cM0, G3, ER+, PR+, HER2-) - Signed by Nicholas Lose, MD on 07/11/2019   07/19/2019 Oncotype testing   Mammaprint: high risk, 29% chance of recurrence in 10 years without systemic treatment.    09/08/2019 Surgery   Re-excision x2 (08/01/19 and 08/12/19) followed by right mastectomy [name]): residual carcinoma and DCIS, 1/5 lymph nodes positive, clear margins.    10/14/2019 - 12/15/2019 Chemotherapy   The patient had dexamethasone (DECADRON) 4 MG tablet, 4 mg (100 % of original dose 4 mg), Oral, Daily, 1 of 1 cycle, Start date: 07/22/2019, End date: 11/24/2019 Dose modification: 4 mg (original dose 4 mg, Cycle 0) palonosetron (ALOXI) injection 0.25 mg, 0.25 mg,  Intravenous,  Once, 4 of 4 cycles Administration: 0.25 mg (10/14/2019), 0.25 mg (11/04/2019), 0.25 mg (11/24/2019), 0.25 mg (12/15/2019) pegfilgrastim (NEULASTA ONPRO KIT) injection 6 mg, 6 mg, Subcutaneous, Once, 4 of 4 cycles Administration: 6 mg (10/14/2019), 6 mg (11/04/2019), 6 mg (11/24/2019), 6 mg (12/15/2019) cyclophosphamide (CYTOXAN) 1,260 mg in sodium chloride 0.9 % 250 mL chemo infusion, 600 mg/m2 = 1,260 mg, Intravenous,  Once, 4 of 4 cycles Dose modification: 500 mg/m2 (original dose 600 mg/m2, Cycle 2, Reason: Dose not tolerated) Administration: 1,260 mg (10/14/2019), 1,040 mg (11/04/2019), 1,040 mg (11/24/2019), 1,040 mg (12/15/2019) DOCEtaxel (TAXOTERE) 160 mg in sodium chloride 0.9 % 250 mL chemo infusion, 75 mg/m2 = 160 mg, Intravenous,  Once, 4 of 4 cycles Dose modification: 65 mg/m2 (original dose 75 mg/m2, Cycle 2, Reason: Dose not tolerated) Administration: 160 mg (10/14/2019), 140 mg (11/04/2019), 140 mg (11/24/2019), 140 mg (12/15/2019)  for chemotherapy treatment.    02/01/2020 - 03/16/2020 Radiation Therapy   Adjuvant radiation therapy     CHIEF COMPLIANT: Follow-up s/p radiation  INTERVAL HISTORY: Wendy [name] is a 64 y.o. with above-mentioned history of right breast cancer who underwent a lumpectomy followed by re-excision and then a mastectomy,adjuvant chemotherapy, and completed radiation on 03/16/20. She presents to the clinic todayto discuss antiestrogen therapy.    She is complaining of profound soreness from recent radiation.  Skin darkening and discoloration as well as scabbing.  She is also extremely tired most of the day.  ALLERGIES:  is allergic to hydrocodone-acetaminophen, oxycodone, other, vicodin [hydrocodone-acetaminophen], and brimonidine.  MEDICATIONS:  Current  Outpatient Medications  Medication Sig Dispense Refill  . acetaminophen-codeine (TYLENOL #3) 300-30 MG tablet Take 1 tablet by mouth every 6 (six) hours as needed for moderate pain. 30 tablet 0  .  diazepam (VALIUM) 2 MG tablet Take 1 tablet (2 mg total) by mouth every 12 (twelve) hours as needed for muscle spasms. (Patient not taking: Reported on 01/10/2020) 20 tablet 0  . dorzolamide-timolol (COSOPT) 22.3-6.8 MG/ML ophthalmic solution PLACE 1 DROP INTO BOTH EYES 2 (TWO) TIMES DAILY.    . famotidine (PEPCID) 20 MG tablet Take 20 mg by mouth 2 (two) times daily.    . furosemide (LASIX) 20 MG tablet TAKE 1 TABLET BY MOUTH TWICE A DAY 60 tablet 1  . latanoprost (XALATAN) 0.005 % ophthalmic solution 1 drop at bedtime.     . magic mouthwash w/lidocaine SOLN Take 5 mLs by mouth 4 (four) times daily as needed for mouth pain. 240 mL 2  . potassium chloride (MICRO-K) 10 MEQ CR capsule Take 1 capsule (10 mEq total) by mouth 2 (two) times daily. 30 capsule 0  . predniSONE (DELTASONE) 10 MG tablet Take 1 tablet (10 mg total) by mouth as directed. Taper 6,5,4,3,2,1 (Patient not taking: Reported on 01/10/2020) 21 tablet 0  . triamcinolone ointment (KENALOG) 0.5 % Apply 1 application topically 2 (two) times daily. 30 g 0   No current facility-administered medications for this visit.   Facility-Administered Medications Ordered in Other Visits  Medication Dose Route Frequency Provider Last Rate Last Admin  . heparin lock flush 100 unit/mL  500 Units Intravenous Once Nicholas Lose, MD      . sodium chloride flush (NS) 0.9 % injection 10 mL  10 mL Intravenous PRN Nicholas Lose, MD   10 mL at 10/14/19 1032    PHYSICAL EXAMINATION: ECOG PERFORMANCE STATUS: 1 - Symptomatic but completely ambulatory  Vitals:   03/27/20 1529  BP: 118/68  Pulse: 73  Resp: 18  Temp: (!) 97.3 F (36.3 C)  SpO2: 98%   Filed Weights   03/27/20 1529  Weight: 197 lb (89.4 kg)    LABORATORY DATA:  I have reviewed the data as listed CMP Latest Ref Rng & Units 03/27/2020 01/18/2020 12/15/2019  Glucose 70 - 99 mg/dL 90 95 121(H)  BUN 8 - 23 mg/dL 10 10 7(L)  Creatinine 0.44 - 1.00 mg/dL 0.67 0.69 0.65  Sodium 135 - 145 mmol/L  139 144 140  Potassium 3.5 - 5.1 mmol/L 4.2 4.0 3.7  Chloride 98 - 111 mmol/L 103 111 111  CO2 22 - 32 mmol/L '29 24 23  ' Calcium 8.9 - 10.3 mg/dL 9.4 8.8(L) 8.6(L)  Total Protein 6.5 - 8.1 g/dL 7.4 6.1(L) 5.8(L)  Total Bilirubin 0.3 - 1.2 mg/dL 0.5 0.7 0.5  Alkaline Phos 38 - 126 U/L 87 56 58  AST 15 - 41 U/L '19 19 16  ' ALT 0 - 44 U/L '10 11 11    ' Lab Results  Component Value Date   WBC 4.3 03/27/2020   HGB 14.2 03/27/2020   HCT 44.8 03/27/2020   MCV 87.2 03/27/2020   PLT 120 (L) 03/27/2020   NEUTROABS 2.1 03/27/2020    ASSESSMENT & PLAN:  Malignant neoplasm of upper-outer quadrant of right breast in female, estrogen receptor positive ([place]) 06/27/2019:Right lumpectomy [name]): IDC, grade 3, at least 2.5cm, involving a complex sclerosing lesion, 1.1cm, with high grade DCIS, involved margins, 1/3 right axillary lymph nodes positive, 1.4cm.ER 100%, PR 30%, Ki-67 20%, HER-2 IHC negative T2 N1 stage IIA MammaPrint:  High risk, 10-year risk of recurrence untreated: 29% 09/08/2019:Re-excision x2 (08/01/19 and 08/12/19) followed by right mastectomy [name]): residual carcinoma and DCIS, 1/5 lymph nodes positive, clear margins.  Treatment plan: 1.Adjuvant chemotherapy withTaxotere and Cytoxan every 3 weeks x4completed 12/15/2019 2.Adjuvant radiation therapy completed 03/16/2020 3.Follow-up adjuvant antiestrogen therapywith anastrozole 1 mg daily x7 years Genetic testing ------------------------------------------------------------------------------------------------------ Treatment plan: Plan for adjuvant antiestrogen therapy with anastrozole x7 years  Bilateral lower extremity swelling:  01/26/2020: Echocardiogram EF 60 to 65% 01/21/2020: Ultrasound lower extremity: No DVT Liver and kidney function are also adequate. Continue with Lasix until the swelling has subsided.  I renewed a prescription for potassium. Today's lab values look stable.  Return to clinic in 3 months for  survivorship care plan visit.  No orders of the defined types were placed in this encounter.  The patient has a good understanding of the overall plan. she agrees with it. she will call with any problems that may develop before the next visit here.  Total time spent: 30 mins including face to face time and time spent for planning, charting and coordination of care  Nicholas Lose, MD 03/27/2020  I, Wendy [name], am acting as scribe for Dr. Nicholas Lose.  I have reviewed the above documentation for accuracy and completeness, and I agree with the above.

## 2020-03-27 ENCOUNTER — Other Ambulatory Visit: Payer: Self-pay

## 2020-03-27 ENCOUNTER — Inpatient Hospital Stay (HOSPITAL_BASED_OUTPATIENT_CLINIC_OR_DEPARTMENT_OTHER): Payer: No Typology Code available for payment source | Admitting: Hematology and Oncology

## 2020-03-27 ENCOUNTER — Inpatient Hospital Stay: Payer: No Typology Code available for payment source | Attending: Hematology and Oncology

## 2020-03-27 DIAGNOSIS — Z170 Estrogen receptor positive status [ER+]: Secondary | ICD-10-CM

## 2020-03-27 DIAGNOSIS — C773 Secondary and unspecified malignant neoplasm of axilla and upper limb lymph nodes: Secondary | ICD-10-CM | POA: Insufficient documentation

## 2020-03-27 DIAGNOSIS — C50411 Malignant neoplasm of upper-outer quadrant of right female breast: Secondary | ICD-10-CM | POA: Diagnosis not present

## 2020-03-27 DIAGNOSIS — Z9221 Personal history of antineoplastic chemotherapy: Secondary | ICD-10-CM | POA: Insufficient documentation

## 2020-03-27 DIAGNOSIS — Z923 Personal history of irradiation: Secondary | ICD-10-CM | POA: Diagnosis not present

## 2020-03-27 DIAGNOSIS — Z9011 Acquired absence of right breast and nipple: Secondary | ICD-10-CM | POA: Diagnosis not present

## 2020-03-27 LAB — CMP (CANCER CENTER ONLY)
ALT: 10 U/L (ref 0–44)
AST: 19 U/L (ref 15–41)
Albumin: 3.8 g/dL (ref 3.5–5.0)
Alkaline Phosphatase: 87 U/L (ref 38–126)
Anion gap: 7 (ref 5–15)
BUN: 10 mg/dL (ref 8–23)
CO2: 29 mmol/L (ref 22–32)
Calcium: 9.4 mg/dL (ref 8.9–10.3)
Chloride: 103 mmol/L (ref 98–111)
Creatinine: 0.67 mg/dL (ref 0.44–1.00)
GFR, Est AFR Am: >60 mL/min (ref >60)
GFR, Estimated: >60 mL/min (ref >60)
Glucose, Bld: 90 mg/dL (ref 70–99)
Potassium: 4.2 mmol/L (ref 3.5–5.1)
Sodium: 139 mmol/L (ref 135–145)
Total Bilirubin: 0.5 mg/dL (ref 0.3–1.2)
Total Protein: 7.4 g/dL (ref 6.5–8.1)

## 2020-03-27 LAB — CBC WITH DIFFERENTIAL (CANCER CENTER ONLY)
Abs Immature Granulocytes: 0.01 10*3/uL (ref 0.00–0.07)
Basophils Absolute: 0 10*3/uL (ref 0.0–0.1)
Basophils Relative: 0 %
Eosinophils Absolute: 0 10*3/uL (ref 0.0–0.5)
Eosinophils Relative: 0 %
HCT: 44.8 % (ref 36.0–46.0)
Hemoglobin: 14.2 g/dL (ref 12.0–15.0)
Immature Granulocytes: 0 %
Lymphocytes Relative: 36 %
Lymphs Abs: 1.5 10*3/uL (ref 0.7–4.0)
MCH: 27.6 pg (ref 26.0–34.0)
MCHC: 31.7 g/dL (ref 30.0–36.0)
MCV: 87.2 fL (ref 80.0–100.0)
Monocytes Absolute: 0.6 10*3/uL (ref 0.1–1.0)
Monocytes Relative: 15 %
Neutro Abs: 2.1 10*3/uL (ref 1.7–7.7)
Neutrophils Relative %: 49 %
Platelet Count: 120 10*3/uL — ABNORMAL LOW (ref 150–400)
RBC: 5.14 MIL/uL — ABNORMAL HIGH (ref 3.87–5.11)
RDW: 12.5 % (ref 11.5–15.5)
WBC Count: 4.3 10*3/uL (ref 4.0–10.5)
nRBC: 0 % (ref 0.0–0.2)

## 2020-03-27 MED ORDER — POTASSIUM CHLORIDE ER 10 MEQ PO CPCR: 10.0000 meq | ORAL_CAPSULE | Freq: Two times a day (BID) | ORAL | 1 refills | Status: DC

## 2020-03-27 MED ORDER — ANASTROZOLE 1 MG PO TABS
1.0000 mg | ORAL_TABLET | Freq: Every day | ORAL | 3 refills | Status: DC
Start: 2020-03-27 — End: 2020-09-10

## 2020-03-27 NOTE — Assessment & Plan Note (Signed)
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  Treatment plan: 1.Adjuvant chemotherapy withTaxotere and Cytoxan every 3 weeks x4completed 12/15/2019 2.Adjuvant radiation therapy completed 03/16/2020 3.Follow-up adjuvant antiestrogen therapywith anastrozole 1 mg daily x7 years Genetic testing ------------------------------------------------------------------------------------------------------ Treatment plan: Plan for adjuvant antiestrogen therapy with anastrozole x7 years  Bilateral lower extremity swelling:  01/26/2020: Echocardiogram EF 60 to 65% 01/21/2020: Ultrasound lower extremity: No DVT Liver and kidney function are also adequate.

## 2020-03-30 NOTE — Progress Notes (Signed)
  Radiation Oncology         [phone] ________________________________  Name: Wendy [name] MRN: [id number]  Date: 03/16/2020  DOB: 1956-01-08  End of Treatment Note  Diagnosis:   Right-sided breast cancer     Indication for treatment:  Curative       Radiation treatment dates:   01/31/2020 through 03/16/2020  Site/dose:   The patient initially received a dose of 50.4 Gy in 28 fractions to the chest wall and supraclavicular region. This was delivered using a 3-D conformal, 4 field technique. The patient then received a boost to the mastectomy scar. This delivered an additional 10 Gy in 5 fractions using an en face electron field. The total dose was 60.4 Gy.   Narrative: The patient tolerated radiation treatment relatively well.   The patient had some expected skin irritation as she progressed during treatment.    Plan: The patient has completed radiation treatment. The patient will return to radiation oncology clinic for routine followup in one month. I advised the patient to call or return sooner if they have any questions or concerns related to their recovery or treatment. ________________________________  Jodelle Gross, M.D., Ph.D.

## 2020-04-11 ENCOUNTER — Telehealth: Payer: Self-pay | Admitting: *Deleted

## 2020-04-11 ENCOUNTER — Telehealth: Payer: Self-pay | Admitting: Radiation Oncology

## 2020-04-11 NOTE — Telephone Encounter (Signed)
Pt called requesting extension for return back to work. Per Dr.Gudena, OK to have extension. Advised pt to bring paperwork to be signed. Pt verbalized understanding

## 2020-04-11 NOTE — Telephone Encounter (Signed)
  Radiation Oncology         [phone] ________________________________  Name: Wendy [name] MRN: [id number]  Date of Service: 04/11/2020  DOB: 04-Jan-1956  Post Treatment Telephone Note  Diagnosis: Stage IIA, cT2N1M0 ER/PR invasive ductal carcinoma  Interval Since Last Radiation:  4 weeks   01/31/2020 through 03/16/2020: The patient initially received a dose of 50.4 Gy in 28 fractions to the chest wall and supraclavicular region. This was delivered using a 3-D conformal, 4 field technique. The patient then received a boost to the mastectomy scar. This delivered an additional 10 Gy in 5 fractions using an en face electron field. The total dose was 60.4 Gy.  Narrative:  The patient was contacted today for routine follow-up. During treatment she did very well with radiotherapy, but did have have some areas of dry and moist desquamation toward the end of treatment. She reports she is doing well with her healing since completing treatment. She has had improvement of the desquamation overall and hyperpigmentation throughout the chest. She does have some shooting pains and cramping of the chest wall at times, and some tightness of her skin.  Impression/Plan: 1. Stage IIA, cT2N1M0 ER/PR invasive ductal carcinoma. The patient has been doing well since completion of radiotherapy. We discussed that we would be happy to continue to follow her as needed, but she will also continue to follow up with Dr. Lindi Adie  in medical oncology. She was counseled on skin care as well as measures to avoid sun exposure to this area. I also let her know she may want to consider seeing PT, and she does have some prior exercises she will try to see if this helps her skin tightness. She also will see her plastic surgeon in December, but was encouraged to call back if she felt she needed to be evaluated by [place] as well. 2. Survivorship. We discussed the importance of survivorship evaluation and encouraged her to attend her  upcoming visit with that clinic.    Carola Rhine, PAC

## 2020-04-19 ENCOUNTER — Other Ambulatory Visit: Payer: Self-pay | Admitting: *Deleted

## 2020-04-19 ENCOUNTER — Telehealth: Payer: Self-pay | Admitting: *Deleted

## 2020-04-19 MED ORDER — GABAPENTIN 300 MG PO CAPS: 300.0000 mg | ORAL_CAPSULE | Freq: Three times a day (TID) | ORAL | 1 refills | Status: DC

## 2020-04-19 NOTE — Telephone Encounter (Signed)
Received call from pt with complaint of numbness and tingling in her fingers and toes.  Pt states the neuropathy in her fingers have not improved since stopping treatment in June.  Pt states she has difficulty judging between hot and cold temperatures with her hands and also experiences difficulty buttoning shirts.  Pt states the neuropathy in bilateral feet does not interfere with walking but does cause discomfort. Pt states she is scheduled to return to work on 05/07/20 and is concerned with being able to preform her essential job function with typing due to neuropathy.  Pt states she has tried gabapentin 100 mg TID with no relief in symptoms.  RN will review with MD for further recommendations.

## 2020-04-19 NOTE — Progress Notes (Signed)
Per MD pt to start taking gabapentin 300 mg TID for neuropathy symptoms.  Prescription sent to pharmacy on file.  RN attempt x1 to contact pt regarding prescription.  No answer, LVM to return call to the office.

## 2020-04-21 ENCOUNTER — Other Ambulatory Visit: Payer: Self-pay | Admitting: Adult Health

## 2020-04-23 ENCOUNTER — Telehealth: Payer: Self-pay | Admitting: *Deleted

## 2020-04-23 NOTE — Telephone Encounter (Signed)
Received VM from pt stating she read about the side effects of gabapentin and does not feel as if that would be appropriate for her to take, especially with returning to work.  Pt requesting possible long term disability as an option.  Per MD pt needing to be seen in office to evaluate neuropathy.  Attempt to call pt and schedule apt, no answer, LVM to return call to the office.

## 2020-04-24 ENCOUNTER — Telehealth: Payer: Self-pay | Admitting: Hematology and Oncology

## 2020-04-24 NOTE — Telephone Encounter (Signed)
Called pt per 10/18 sch message - unable to reach pt . Left message for patient with appt date and time

## 2020-04-26 ENCOUNTER — Other Ambulatory Visit: Payer: Self-pay | Admitting: Hematology and Oncology

## 2020-04-29 NOTE — Progress Notes (Signed)
Patient Care Team: [name], MD as PCP - General (Family Medicine) Mauro Kaufmann, RN as Oncology Nurse Navigator Rockwell Germany, RN as Oncology Nurse Navigator  DIAGNOSIS:    ICD-10-CM   1. Malignant neoplasm of upper-outer quadrant of right breast in female, estrogen receptor positive ([place])  C50.411    Z17.0     SUMMARY OF ONCOLOGIC HISTORY: Oncology History  Malignant neoplasm of upper-outer quadrant of right breast in female, estrogen receptor positive ([place])  05/20/2019 Initial Diagnosis   Palpable right breast mass superficial, ultrasound 11 o'clock position 1.7 cm: Biopsy revealed grade 3 IDC with DCIS ER 100%, PR 30%, Ki-67 20%, HER-2 by IHC negative, axilla lymph node biopsy positive   06/01/2019 Cancer Staging   Staging form: Breast, AJCC 8th Edition - Clinical stage from 06/01/2019: Stage IIA (cT1c, cN1, cM0, G3, ER+, PR+, HER2-) - Signed by Nicholas Lose, MD on 06/01/2019   06/27/2019 Surgery   Right lumpectomy [name]): IDC, grade 3, at least 2.5cm, involving a complex sclerosing lesion, 1.1cm, with high grade DCIS, involved margins, 1/3 right axillary lymph nodes positive, 1.4cm.    07/11/2019 Cancer Staging   Staging form: Breast, AJCC 8th Edition - Pathologic stage from 07/11/2019: Stage IIA (pT2, pN1a, cM0, G3, ER+, PR+, HER2-) - Signed by Nicholas Lose, MD on 07/11/2019   07/19/2019 Oncotype testing   Mammaprint: high risk, 29% chance of recurrence in 10 years without systemic treatment.    09/08/2019 Surgery   Re-excision x2 (08/01/19 and 08/12/19) followed by right mastectomy [name]): residual carcinoma and DCIS, 1/5 lymph nodes positive, clear margins.    10/14/2019 - 12/15/2019 Chemotherapy   The patient had dexamethasone (DECADRON) 4 MG tablet, 4 mg (100 % of original dose 4 mg), Oral, Daily, 1 of 1 cycle, Start date: 07/22/2019, End date: 11/24/2019 Dose modification: 4 mg (original dose 4 mg, Cycle 0) palonosetron (ALOXI) injection 0.25 mg, 0.25 mg,  Intravenous,  Once, 4 of 4 cycles Administration: 0.25 mg (10/14/2019), 0.25 mg (11/04/2019), 0.25 mg (11/24/2019), 0.25 mg (12/15/2019) pegfilgrastim (NEULASTA ONPRO KIT) injection 6 mg, 6 mg, Subcutaneous, Once, 4 of 4 cycles Administration: 6 mg (10/14/2019), 6 mg (11/04/2019), 6 mg (11/24/2019), 6 mg (12/15/2019) cyclophosphamide (CYTOXAN) 1,260 mg in sodium chloride 0.9 % 250 mL chemo infusion, 600 mg/m2 = 1,260 mg, Intravenous,  Once, 4 of 4 cycles Dose modification: 500 mg/m2 (original dose 600 mg/m2, Cycle 2, Reason: Dose not tolerated) Administration: 1,260 mg (10/14/2019), 1,040 mg (11/04/2019), 1,040 mg (11/24/2019), 1,040 mg (12/15/2019) DOCEtaxel (TAXOTERE) 160 mg in sodium chloride 0.9 % 250 mL chemo infusion, 75 mg/m2 = 160 mg, Intravenous,  Once, 4 of 4 cycles Dose modification: 65 mg/m2 (original dose 75 mg/m2, Cycle 2, Reason: Dose not tolerated) Administration: 160 mg (10/14/2019), 140 mg (11/04/2019), 140 mg (11/24/2019), 140 mg (12/15/2019)  for chemotherapy treatment.    02/01/2020 - 03/16/2020 Radiation Therapy   Adjuvant radiation therapy   03/27/2020 -  Anti-estrogen oral therapy   Anastrozole, planned treatment duration 7 years.      CHIEF COMPLIANT: Follow-up of right breast cancer on anastrozole, neuropathy  INTERVAL HISTORY: Wendy [name] is a 64 y.o. with above-mentioned history of right breast cancer who underwent a lumpectomy followed by re-excision and then a mastectomy,adjuvant chemotherapy, radiation, and is currently on antiestrogen therapy with anastrozole. She presents to the clinic todayto discuss neuropathy in her fingers and toes.  The neuropathy is disabling and she is unable to go back to work.  She cannot sit  for more than couple of hours and she cannot stand for more than an hour.  ALLERGIES:  is allergic to hydrocodone-acetaminophen, oxycodone, other, vicodin [hydrocodone-acetaminophen], and brimonidine.  MEDICATIONS:  Current Outpatient Medications  Medication  Sig Dispense Refill  . acetaminophen-codeine (TYLENOL #3) 300-30 MG tablet Take 1 tablet by mouth every 6 (six) hours as needed for moderate pain. 30 tablet 0  . anastrozole (ARIMIDEX) 1 MG tablet Take 1 tablet (1 mg total) by mouth daily. 90 tablet 3  . diazepam (VALIUM) 2 MG tablet Take 1 tablet (2 mg total) by mouth every 12 (twelve) hours as needed for muscle spasms. (Patient not taking: Reported on 01/10/2020) 20 tablet 0  . dorzolamide-timolol (COSOPT) 22.3-6.8 MG/ML ophthalmic solution PLACE 1 DROP INTO BOTH EYES 2 (TWO) TIMES DAILY.    . famotidine (PEPCID) 20 MG tablet Take 20 mg by mouth 2 (two) times daily.    . furosemide (LASIX) 20 MG tablet TAKE 1 TABLET BY MOUTH TWICE A DAY 60 tablet 1  . gabapentin (NEURONTIN) 300 MG capsule Take 1 capsule (300 mg total) by mouth 3 (three) times daily. 90 capsule 1  . latanoprost (XALATAN) 0.005 % ophthalmic solution 1 drop at bedtime.     . magic mouthwash w/lidocaine SOLN Take 5 mLs by mouth 4 (four) times daily as needed for mouth pain. 240 mL 2  . potassium chloride (MICRO-K) 10 MEQ CR capsule Take 1 capsule (10 mEq total) by mouth 2 (two) times daily. 30 capsule 1  . predniSONE (DELTASONE) 10 MG tablet Take 1 tablet (10 mg total) by mouth as directed. Taper 6,5,4,3,2,1 (Patient not taking: Reported on 01/10/2020) 21 tablet 0  . triamcinolone ointment (KENALOG) 0.5 % APPLY TO AFFECTED AREA TWICE A DAY 30 g 0   No current facility-administered medications for this visit.   Facility-Administered Medications Ordered in Other Visits  Medication Dose Route Frequency Provider Last Rate Last Admin  . heparin lock flush 100 unit/mL  500 Units Intravenous Once Nicholas Lose, MD      . sodium chloride flush (NS) 0.9 % injection 10 mL  10 mL Intravenous PRN Nicholas Lose, MD   10 mL at 10/14/19 1032    PHYSICAL EXAMINATION: ECOG PERFORMANCE STATUS: 1 - Symptomatic but completely ambulatory  Vitals:   04/30/20 0852  BP: 119/66  Pulse: 78  Resp: 18   Temp: 97.8 F (36.6 C)  SpO2: 98%   Filed Weights   04/30/20 0852  Weight: 198 lb 14.4 oz (90.2 kg)    LABORATORY DATA:  I have reviewed the data as listed CMP Latest Ref Rng & Units 03/27/2020 01/18/2020 12/15/2019  Glucose 70 - 99 mg/dL 90 95 121(H)  BUN 8 - 23 mg/dL 10 10 7(L)  Creatinine 0.44 - 1.00 mg/dL 0.67 0.69 0.65  Sodium 135 - 145 mmol/L 139 144 140  Potassium 3.5 - 5.1 mmol/L 4.2 4.0 3.7  Chloride 98 - 111 mmol/L 103 111 111  CO2 22 - 32 mmol/L _0 Calcium 8.9 - 10.3 mg/dL 9.4 8.8(L) 8.6(L)  Total Protein 6.5 - 8.1 g/dL 7.4 6.1(L) 5.8(L)  Total Bilirubin 0.3 - 1.2 mg/dL 0.5 0.7 0.5  Alkaline Phos 38 - 126 U/L 87 56 58  AST 15 - 41 U/L _1 ALT 0 - 44 U/L _2 Lab Results  Component Value Date   WBC 4.3 03/27/2020   HGB 14.2 03/27/2020   HCT 44.8 03/27/2020   MCV 87.2 03/27/2020  PLT 120 (L) 03/27/2020   NEUTROABS 2.1 03/27/2020    ASSESSMENT & PLAN:  Malignant neoplasm of upper-outer quadrant of right breast in female, estrogen receptor positive ([place]) 06/27/2019:Right lumpectomy [name]): IDC, grade 3, at least 2.5cm, involving a complex sclerosing lesion, 1.1cm, with high grade DCIS, involved margins, 1/3 right axillary lymph nodes positive, 1.4cm.ER 100%, PR 30%, Ki-67 20%, HER-2 IHC negative T2 N1 stage IIA MammaPrint: High risk, 10-year risk of recurrence untreated: 29% 09/08/2019:Re-excision x2 (08/01/19 and 08/12/19) followed by right mastectomy [name]): residual carcinoma and DCIS, 1/5 lymph nodes positive, clear margins.  Treatment plan: 1.Adjuvant chemotherapy withTaxotere and Cytoxan every 3 weeks x4completed 12/15/2019 2.Adjuvant radiation therapycompleted 03/16/2020 3.Follow-up adjuvant antiestrogen therapywith anastrozole 1 mg daily x7 years Genetic testing ------------------------------------------------------------------------------------------------------ Treatment plan: Plan for adjuvant antiestrogen therapy with  anastrozole x7 years  Bilateral lower extremity swelling: 01/26/2020: Echocardiogram EF 60 to 65% 01/21/2020: Ultrasound lower extremity: No DVT Continue with Lasix as needed  Chemo-induced peripheral neuropathy: Encouraged her to take gabapentin at bedtime The neuropathy is quite severe and she is unable to work. She is looking to apply for long-term disability/Social Security and disability    No orders of the defined types were placed in this encounter.  The patient has a good understanding of the overall plan. she agrees with it. she will call with any problems that may develop before the next visit here.  Total time spent: 30 mins including face to face time and time spent for planning, charting and coordination of care  Nicholas Lose, MD 04/30/2020  I, Cloyde Reams Dorshimer, am acting as scribe for Dr. Nicholas Lose.  I have reviewed the above documentation for accuracy and completeness, and I agree with the above.

## 2020-04-30 ENCOUNTER — Inpatient Hospital Stay
Payer: No Typology Code available for payment source | Attending: Hematology and Oncology | Admitting: Hematology and Oncology

## 2020-04-30 ENCOUNTER — Other Ambulatory Visit: Payer: Self-pay

## 2020-04-30 DIAGNOSIS — G620 Drug-induced polyneuropathy: Secondary | ICD-10-CM | POA: Insufficient documentation

## 2020-04-30 DIAGNOSIS — C773 Secondary and unspecified malignant neoplasm of axilla and upper limb lymph nodes: Secondary | ICD-10-CM | POA: Diagnosis not present

## 2020-04-30 DIAGNOSIS — Z923 Personal history of irradiation: Secondary | ICD-10-CM | POA: Diagnosis not present

## 2020-04-30 DIAGNOSIS — Z9011 Acquired absence of right breast and nipple: Secondary | ICD-10-CM | POA: Diagnosis not present

## 2020-04-30 DIAGNOSIS — Z170 Estrogen receptor positive status [ER+]: Secondary | ICD-10-CM

## 2020-04-30 DIAGNOSIS — Z9221 Personal history of antineoplastic chemotherapy: Secondary | ICD-10-CM | POA: Insufficient documentation

## 2020-04-30 DIAGNOSIS — Z79811 Long term (current) use of aromatase inhibitors: Secondary | ICD-10-CM | POA: Diagnosis not present

## 2020-04-30 DIAGNOSIS — C50411 Malignant neoplasm of upper-outer quadrant of right female breast: Secondary | ICD-10-CM | POA: Diagnosis not present

## 2020-04-30 NOTE — Assessment & Plan Note (Signed)
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  Treatment plan: 1.Adjuvant chemotherapy withTaxotere and Cytoxan every 3 weeks x4completed 12/15/2019 2.Adjuvant radiation therapycompleted 03/16/2020 3.Follow-up adjuvant antiestrogen therapywith anastrozole 1 mg daily x7 years Genetic testing ------------------------------------------------------------------------------------------------------ Treatment plan: Plan for adjuvant antiestrogen therapy with anastrozole x7 years  Bilateral lower extremity swelling: 01/26/2020: Echocardiogram EF 60 to 65% 01/21/2020: Ultrasound lower extremity: No DVT Liver and kidney function are also adequate. Continue with Lasix until the swelling has subsided.  I renewed a prescription for potassium.  Chemo-induced peripheral neuropathy: she does not want to take gabapentin. The neuropathy is quite severe and she is unable to work. She is looking to apply for long-term disability.

## 2020-05-02 ENCOUNTER — Telehealth: Payer: Self-pay | Admitting: Hematology and Oncology

## 2020-05-02 NOTE — Telephone Encounter (Signed)
No 10/25 los, no changes made to pt schedule

## 2020-05-25 ENCOUNTER — Other Ambulatory Visit: Payer: Self-pay | Admitting: Hematology and Oncology

## 2020-06-04 ENCOUNTER — Other Ambulatory Visit: Payer: Self-pay | Admitting: Hematology and Oncology

## 2020-06-08 ENCOUNTER — Encounter: Payer: Self-pay | Admitting: Hematology and Oncology

## 2020-06-12 ENCOUNTER — Ambulatory Visit (INDEPENDENT_AMBULATORY_CARE_PROVIDER_SITE_OTHER): Payer: No Typology Code available for payment source | Admitting: Plastic Surgery

## 2020-06-12 ENCOUNTER — Encounter: Payer: Self-pay | Admitting: Plastic Surgery

## 2020-06-12 ENCOUNTER — Other Ambulatory Visit: Payer: Self-pay

## 2020-06-12 VITALS — BP 140/68 | HR 88 | Temp 98.1°F

## 2020-06-12 DIAGNOSIS — Z9011 Acquired absence of right breast and nipple: Secondary | ICD-10-CM | POA: Diagnosis not present

## 2020-06-12 NOTE — Progress Notes (Signed)
   Subjective:    Patient ID: Wendy [name], female    DOB: 04-Sep-1955, 64 y.o.   MRN: [id number]  She is a 64 year old female here for follow-up after breast reconstruction for breast cancer.  The patient had a right mastectomy with implant-based reconstruction.  The exchange was done in June and her Port-A-Cath was removed at the same time.  She also had a left mastopexy at that time.  She then underwent radiation which ended in September.  The incisions are all intact and healing well.  She does have skin discoloration with hyperpigmentation due to the radiation.  She also has a little bit of right hand swelling.  She is waiting for her compression sleeve to come in.  She does feel some tightness but has good range of motion.      Review of Systems  Constitutional: Negative.   HENT: Negative.   Eyes: Negative.   Respiratory: Negative.   Cardiovascular: Negative.   Gastrointestinal: Negative.   Genitourinary: Negative.   Hematological: Negative.        Objective:   Physical Exam Vitals and nursing note reviewed.  Constitutional:      Appearance: Normal appearance.  HENT:     Head: Normocephalic and atraumatic.  Eyes:     Extraocular Movements: Extraocular movements intact.  Cardiovascular:     Rate and Rhythm: Normal rate.     Pulses: Normal pulses.  Pulmonary:     Effort: Pulmonary effort is normal.  Neurological:     General: No focal deficit present.     Mental Status: She is alert. Mental status is at baseline.  Psychiatric:        Mood and Affect: Mood normal.        Behavior: Behavior normal.        Thought Content: Thought content normal.        Judgment: Judgment normal.        Assessment & Plan:     ICD-10-CM   1. S/P mastectomy, right  Z90.11   2. Acquired absence of right breast  Z90.11     We discussed physical therapy.  She would like to wait for now.  She agrees to do the stretching and range of motion at home.  I also encouraged her to get a  compression sleeves online while she is waiting for her fitted one.  I would like to see her back in a year but if she has any restriction in range of motion I would like to hear back from her sooner.  The patient is in agreement.  It was really great to see her today.  Nipple areola tattoo likely in a year.  Pictures were obtained of the patient and placed in the chart with the patient's or guardian's permission.

## 2020-06-19 ENCOUNTER — Other Ambulatory Visit: Payer: Self-pay | Admitting: Hematology and Oncology

## 2020-06-20 ENCOUNTER — Other Ambulatory Visit: Payer: Self-pay | Admitting: Hematology and Oncology

## 2020-06-22 ENCOUNTER — Other Ambulatory Visit: Payer: Self-pay

## 2020-06-22 MED ORDER — TRIAMCINOLONE ACETONIDE 0.5 % EX OINT: TOPICAL_OINTMENT | CUTANEOUS | 0 refills | Status: DC

## 2020-06-27 ENCOUNTER — Telehealth: Payer: Self-pay | Admitting: *Deleted

## 2020-06-27 MED ORDER — VENLAFAXINE HCL 37.5 MG PO TABS: 37.5000 mg | ORAL_TABLET | Freq: Every day | ORAL | 1 refills | Status: DC

## 2020-06-27 NOTE — Telephone Encounter (Signed)
Received call from pt with complaint of hot flashes while on anastrozole.  Pt states during the summer she was on Effexor 37.5 mg p.o daily and stopped.  Pt requesting to re start Effexor to help control hot flashes. Per MD ok for pt to start Effexor 37.5 mg p.o daily. Prescription sent to pharmacy on file.

## 2020-06-28 ENCOUNTER — Telehealth: Payer: Self-pay | Admitting: *Deleted

## 2020-06-28 ENCOUNTER — Encounter: Payer: Self-pay | Admitting: *Deleted

## 2020-06-28 NOTE — Telephone Encounter (Signed)
Received call from pt requesting recent office visit notes be sent to [employer] long term disability.  Current release of medical records signed document for [employer] on file.  RN successfully faxed recent MD Rentz office notes to [facility] at [phone].

## 2020-07-04 ENCOUNTER — Other Ambulatory Visit: Payer: Self-pay

## 2020-07-04 ENCOUNTER — Inpatient Hospital Stay: Payer: No Typology Code available for payment source | Attending: Hematology and Oncology | Admitting: Adult Health

## 2020-07-04 ENCOUNTER — Encounter: Payer: Self-pay | Admitting: Adult Health

## 2020-07-04 VITALS — BP 143/72 | HR 67 | Temp 98.2°F | Resp 18 | Ht 63.0 in | Wt 201.2 lb

## 2020-07-04 DIAGNOSIS — N951 Menopausal and female climacteric states: Secondary | ICD-10-CM | POA: Insufficient documentation

## 2020-07-04 DIAGNOSIS — E2839 Other primary ovarian failure: Secondary | ICD-10-CM

## 2020-07-04 DIAGNOSIS — Z9011 Acquired absence of right breast and nipple: Secondary | ICD-10-CM | POA: Insufficient documentation

## 2020-07-04 DIAGNOSIS — Z9221 Personal history of antineoplastic chemotherapy: Secondary | ICD-10-CM | POA: Insufficient documentation

## 2020-07-04 DIAGNOSIS — Z923 Personal history of irradiation: Secondary | ICD-10-CM | POA: Diagnosis not present

## 2020-07-04 DIAGNOSIS — C50411 Malignant neoplasm of upper-outer quadrant of right female breast: Secondary | ICD-10-CM | POA: Diagnosis present

## 2020-07-04 DIAGNOSIS — Z170 Estrogen receptor positive status [ER+]: Secondary | ICD-10-CM | POA: Diagnosis not present

## 2020-07-04 NOTE — Progress Notes (Signed)
SURVIVORSHIP VIRTUAL VISIT:   BRIEF ONCOLOGIC HISTORY:  Oncology History  Malignant neoplasm of upper-outer quadrant of right breast in female, estrogen receptor positive ([place])  05/20/2019 Initial Diagnosis   Palpable right breast mass superficial, ultrasound 11 o'clock position 1.7 cm: Biopsy revealed grade 3 IDC with DCIS ER 100%, PR 30%, Ki-67 20%, HER-2 by IHC negative, axilla lymph node biopsy positive   06/01/2019 Cancer Staging   Staging form: Breast, AJCC 8th Edition - Clinical stage from 06/01/2019: Stage IIA (cT1c, cN1, cM0, G3, ER+, PR+, HER2-)   06/27/2019 Surgery   Right lumpectomy [name]) [phone]): IDC, grade 3, at least 2.5cm, involving a complex sclerosing lesion, 1.1cm, with high grade DCIS, involved margins, 1/3 right axillary lymph nodes positive, 1.4cm.    07/11/2019 Cancer Staging   Staging form: Breast, AJCC 8th Edition - Pathologic stage from 07/11/2019: Stage IIA (pT2, pN1a, cM0, G3, ER+, PR+, HER2-)   07/19/2019 Oncotype testing   Mammaprint: high risk, 29% chance of recurrence in 10 years without systemic treatment.    09/08/2019 Surgery   Re-excision x2 (08/01/19 and 08/12/19) [phone] and [id number])   Right mastectomy [name]) [phone]): residual carcinoma and DCIS, 1/5 lymph nodes positive, clear margins.    10/14/2019 - 12/15/2019 Chemotherapy   dexamethasone (DECADRON) 4 MG tablet, 4 mg (100 % of original dose 4 mg), Oral, Daily, 1 of 1 cycle, Start date: 07/22/2019, End date: 11/24/2019. Dose modification: 4 mg (original dose 4 mg, Cycle 0).  palonosetron (ALOXI) injection 0.25 mg, 0.25 mg, Intravenous,  Once, 4 of 4 cycles. Administration: 0.25 mg (10/14/2019), 0.25 mg (11/04/2019), 0.25 mg (11/24/2019), 0.25 mg (12/15/2019).  pegfilgrastim (NEULASTA ONPRO KIT) injection 6 mg, 6 mg, Subcutaneous, Once, 4 of 4 cycles. Administration: 6 mg (10/14/2019), 6 mg (11/04/2019), 6 mg (11/24/2019), 6 mg (12/15/2019).  cyclophosphamide (CYTOXAN) 1,260 mg in sodium  chloride 0.9 % 250 mL chemo infusion, 600 mg/m2 = 1,260 mg, Intravenous,  Once, 4 of 4 cycles. Dose modification: 500 mg/m2 (original dose 600 mg/m2, Cycle 2, Reason: Dose not tolerated). Administration: 1,260 mg (10/14/2019), 1,040 mg (11/04/2019), 1,040 mg (11/24/2019), 1,040 mg (12/15/2019).  DOCEtaxel (TAXOTERE) 160 mg in sodium chloride 0.9 % 250 mL chemo infusion, 75 mg/m2 = 160 mg, Intravenous,  Once, 4 of 4 cycles. Dose modification: 65 mg/m2 (original dose 75 mg/m2, Cycle 2, Reason: Dose not tolerated). Administration: 160 mg (10/14/2019), 140 mg (11/04/2019), 140 mg (11/24/2019), 140 mg (12/15/2019).   01/31/2020 - 03/16/2020 Radiation Therapy   The patient initially received a dose of 50.4 Gy in 28 fractions to the chest wall and supraclavicular region. This was delivered using a 3-D conformal, 4 field technique. The patient then received a boost to the mastectomy scar. This delivered an additional 10 Gy in 5 fractions using an en face electron field. The total dose was 60.4 Gy.   03/2020 - 03/2027 Anti-estrogen oral therapy   Anastrozole     INTERVAL HISTORY:  Ms. Lahmann to review her survivorship care plan detailing her treatment course for breast cancer, as well as monitoring long-term side effects of that treatment, education regarding health maintenance, screening, and overall wellness and health promotion.     Overall, Ms. Deziel reports feeling quite well.  She is taking anastrozole daily.  She has hot flashes which she manages with Effexor.  She is otherwise doing quite well.    REVIEW OF SYSTEMS:  Review of Systems  Constitutional: Negative for appetite change, chills, fatigue, fever and unexpected weight change.  HENT:   Negative  for hearing loss, lump/mass and trouble swallowing.   Eyes: Negative for eye problems and icterus.  Respiratory: Negative for chest tightness, cough and shortness of breath.   Cardiovascular: Negative for chest pain, leg swelling and palpitations.   Gastrointestinal: Negative for abdominal distention, abdominal pain, constipation, diarrhea, nausea and vomiting.  Endocrine: Positive for hot flashes.  Genitourinary: Negative for difficulty urinating.   Musculoskeletal: Negative for arthralgias.  Skin: Negative for itching and rash.  Neurological: Negative for dizziness, extremity weakness, headaches and numbness.  Hematological: Negative for adenopathy. Does not bruise/bleed easily.  Psychiatric/Behavioral: Negative for depression. The patient is not nervous/anxious.   Breast: Denies any new nodularity, masses, tenderness, nipple changes, or nipple discharge.      ONCOLOGY TREATMENT TEAM:  1. Surgeon:  Dr. [name] at [facility] 2. Medical Oncologist: Dr. Lindi Adie  3. Radiation Oncologist: Dr. Lisbeth Renshaw    PAST MEDICAL/SURGICAL HISTORY:  Past Medical History:  Diagnosis Date  . Breast cancer ([place])   . Colon cancer ([place])   . DDD (degenerative disc disease), cervical   . DDD (degenerative disc disease), lumbar   . ESOPHAGITIS, REFLUX   . Glaucoma   . HELICOBACTER PYLORI INFECTION   . HEMORRHOIDS, INTERNAL   . History of colon cancer   . History of colon polyps 2019  . Hyperlipidemia   . Radius fracture 2006   Left, Distal  . Thoracic spondylosis   . Tinnitus, bilateral 2013   Past Surgical History:  Procedure Laterality Date  . BREAST LUMPECTOMY WITH RADIOACTIVE SEED AND SENTINEL LYMPH NODE BIOPSY Right 06/27/2019   Procedure: RIGHT BREAST LUMPECTOMY WITH RADIOACTIVE SEED AND SENTINEL LYMPH NODE BIOPSY AND RIGHT TARGETED NODE DISSECTION;  Surgeon: Jovita Kussmaul, MD;  Location: [place];  Service: General;  Laterality: Right;  . BREAST RECONSTRUCTION WITH PLACEMENT OF TISSUE EXPANDER AND FLEX HD (ACELLULAR HYDRATED DERMIS) Right 09/08/2019   Procedure: BREAST RECONSTRUCTION WITH PLACEMENT OF TISSUE EXPANDER AND FLEX HD (ACELLULAR HYDRATED DERMIS);  Surgeon: Wallace Going, DO;  Location: [place];  Service: Plastics;  Laterality: Right;  . BREAST REDUCTION WITH MASTOPEXY Left 01/02/2020   Procedure: MASTOPEXY / REDUCTION FOR SYMMETRY;  Surgeon: Wallace Going, DO;  Location: [place];  Service: Plastics;  Laterality: Left;  . CESAREAN SECTION  1989  . COLONOSCOPY  2019  . HYSTEROSCOPY WITH D & C N/A 09/17/2018   Procedure: DILATATION AND CURETTAGE Marlene Bast;  Surgeon: Brien Few, MD;  Location: [place];  Service: Gynecology;  Laterality: N/A;  . PORT-A-CATH REMOVAL Left 01/02/2020   Procedure: REMOVAL PORT-A-CATH;  Surgeon: Wallace Going, DO;  Location: [place];  Service: Plastics;  Laterality: Left;  . PORTACATH PLACEMENT Left 08/01/2019   Procedure: INSERTION PORT-A-CATH;  Surgeon: Jovita Kussmaul, MD;  Location: [place];  Service: General;  Laterality: Left;  . RE-EXCISION OF BREAST CANCER,SUPERIOR MARGINS Right 08/12/2019   Procedure: RE-EXCISION OF RIGHT BREAST,SUPERIOR MARGINS;  Surgeon: Jovita Kussmaul, MD;  Location: [place];  Service: General;  Laterality: Right;  . RE-EXCISION OF BREAST LUMPECTOMY Right 08/01/2019   Procedure: RE-EXCISION OF RIGHT BREAST CANCER  MARGINS;  Surgeon: Jovita Kussmaul, MD;  Location: [place];  Service: General;  Laterality: Right;  . REMOVAL OF TISSUE EXPANDER AND PLACEMENT OF IMPLANT Right 01/02/2020   Procedure: REMOVAL OF TISSUE EXPANDER AND PLACEMENT OF IMPLANT;  Surgeon: Wallace Going, DO;  Location: [facility];  Service: Plastics;  Laterality: Right;  . SIMPLE MASTECTOMY WITH AXILLARY SENTINEL NODE BIOPSY Right 09/08/2019   Procedure: RIGHT BREAST MASTECTOMY;  Surgeon: Jovita Kussmaul, MD;  Location: [place];  Service: General;  Laterality: Right;  . TONSILLECTOMY     age 23 years old  . WRIST FRACTURE SURGERY  2006     ALLERGIES:  Allergies  Allergen  Reactions  . Hydrocodone-Acetaminophen Itching and Other (See Comments)    SEVERE    . Oxycodone Itching  . Other Other (See Comments)    rakuten eyedrops causes eye redness   . Vicodin [Hydrocodone-Acetaminophen] Itching    SEVERE   . Brimonidine Rash     CURRENT MEDICATIONS:  Outpatient Encounter Medications as of 07/04/2020  Medication Sig Note  . anastrozole (ARIMIDEX) 1 MG tablet Take 1 tablet (1 mg total) by mouth daily.   . dorzolamide-timolol (COSOPT) 22.3-6.8 MG/ML ophthalmic solution PLACE 1 DROP INTO BOTH EYES 2 (TWO) TIMES DAILY. 04/13/2014: Received from: [facility]  . furosemide (LASIX) 20 MG tablet TAKE 1 TABLET BY MOUTH TWICE A DAY   . gabapentin (NEURONTIN) 300 MG capsule TAKE 1 CAPSULE BY MOUTH THREE TIMES A DAY   . latanoprost (XALATAN) 0.005 % ophthalmic solution 1 drop at bedtime.    . triamcinolone ointment (KENALOG) 0.5 % APPLY TO AFFECTED AREA TWICE A DAY   . venlafaxine (EFFEXOR) 37.5 MG tablet Take 1 tablet (37.5 mg total) by mouth daily.   . [DISCONTINUED] prochlorperazine (COMPAZINE) 10 MG tablet Take 1 tablet (10 mg total) by mouth every 6 (six) hours as needed (Nausea or vomiting). (Patient not taking: Reported on 01/10/2020)    Facility-Administered Encounter Medications as of 07/04/2020  Medication  . heparin lock flush 100 unit/mL  . sodium chloride flush (NS) 0.9 % injection 10 mL     ONCOLOGIC FAMILY HISTORY:  Family History  Problem Relation Age of Onset  . Diabetes Mother   . Brain cancer Mother        mets  . Cancer Mother        breast-right  . Breast cancer Mother        d. 68  . Diabetes Father   . Hypertension Father   . Cancer Father        lung  . Heart disease Father   . Lung cancer Father   . Diabetes Sister   . Hypertension Sister   . Glaucoma Sister   . Diabetes Brother   . Hypertension Brother   . Heart disease Brother        congestive heart failure  . Other Son 16       car accident  . Colon  cancer Maternal Grandmother 11       d. 59  . Dementia Paternal Grandfather   . Breast cancer Other        MGMs mother  . Other Maternal Uncle        possibly drowning  . Brain cancer Niece 1       d. 2 years     GENETIC COUNSELING/TESTING: Not at this time  SOCIAL HISTORY:  Social History   Socioeconomic History  . Marital status: Significant Other    Spouse name: Not on file  . Number of children: Not on file  . Years of education: Not on file  . Highest education level: Not on file  Occupational History  . 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 Smoking status: Never Smoker  . Smokeless tobacco: Never Used  Substance and Sexual Activity  . Alcohol use: Yes    Comment: occ  . Drug use: No  . Sexual activity: Not Currently  Other Topics Concern  . Not on file  Social History Narrative  . Not on file   Social Determinants of Health   Financial Resource Strain: Not on file  Food Insecurity: Not on file  Transportation Needs: Not on file  Physical Activity: Not on file  Stress: Not on file  Social Connections: Not on file  Intimate Partner Violence: Not on file     OBSERVATIONS/OBJECTIVE:  BP (!) 143/72 (BP Location: Left Arm, Patient Position: Sitting)   Pulse 67   Temp 98.2 F (36.8 C) (Tympanic)   Resp 18   Ht $R'5\' 3"'Hu$  (1.6 m)   Wt 201 lb 3.2 oz (91.3 kg)   SpO2 100%   BMI 35.64 kg/m  GENERAL: Patient is a well appearing female in no acute distress HEENT:  Sclerae anicteric.  Mask in place. Neck is supple.  NODES:  No cervical, supraclavicular, or axillary lymphadenopathy palpated.  BREAST EXAM: deferred LUNGS:  Clear to auscultation bilaterally.  No wheezes or rhonchi. HEART:  Regular rate and rhythm. No murmur appreciated. ABDOMEN:  Soft, nontender.  Positive, normoactive bowel sounds. No organomegaly palpated. MSK:  No focal spinal tenderness to palpation. Full range of motion bilaterally in the upper  extremities. EXTREMITIES:  No peripheral edema.   SKIN:  Clear with no obvious rashes or skin changes. No nail dyscrasia. NEURO:  Nonfocal. Well oriented.  Appropriate affect.    LABORATORY DATA:  None for this visit.  DIAGNOSTIC IMAGING:  None for this visit.      ASSESSMENT AND PLAN:  Ms.. Wendy [name] is a pleasant 64 y.o. female with Stage IIA right breast invasive ductal carcinoma, ER+/PR+/HER2-, diagnosed in 05/2019, treated with lumpectomy x 2, right mastectomy, adjuvant chemotherapy, adjuvant radiation therapy, and anti-estrogen therapy with Anastrozole beginning in 03/2020.  She presents to the Survivorship Clinic for our initial meeting and routine follow-up post-completion of treatment for breast cancer.    1. Stage IIA right breast cancer:  Ms. Lesh is continuing to recover from definitive treatment for breast cancer. She will follow-up with her medical oncologist, Dr. Lindi Adie in 6 months with history and physical exam per surveillance protocol.  She will continue her anti-estrogen therapy with Anastrozole. Thus far, she is tolerating the Anastrozole well, with minimal side effects. She was instructed to make Dr. Lindi Adie or myself aware if she begins to experience any worsening side effects of the medication and I could see her back in clinic to help manage those side effects, as needed. Her mammogram is due; orders placed today.   Today, a comprehensive survivorship care plan and treatment summary was reviewed with the patient today detailing her breast cancer diagnosis, treatment course, potential late/long-term effects of treatment, appropriate follow-up care with recommendations for the future, and patient education resources.  A copy of this summary, along with a letter will be sent to the patient's primary care provider via mail/fax/In Basket message after today's visit.    2. Bone health:  Given Ms. Mckinstry's age/history of breast cancer and her current treatment regimen including  anti-estrogen therapy with Anastrozole, she is at risk for bone demineralization.  I placed orders for her to undergo bone density testing. In the meantime, she was encouraged to increase her consumption of foods rich  in calcium, as well as increase her weight-bearing activities.  She was given education on specific activities to promote bone health.  3. Cancer screening:  Due to Ms. Hemmerich's history and her age, she should receive screening for skin cancers, colon cancer, and gynecologic cancers.  The information and recommendations are listed on the patient's comprehensive care plan/treatment summary and were reviewed in detail with the patient.    4. Health maintenance and wellness promotion: Ms. Klatt was encouraged to consume 5-7 servings of fruits and vegetables per day. We reviewed the "Nutrition Rainbow" handout, as well as the handout "Take Control of Your Health and Reduce Your Cancer Risk" from the [place].  She was also encouraged to engage in moderate to vigorous exercise for 30 minutes per day most days of the week. We discussed the [employer] fitness program, which is designed for cancer survivors to help them become more physically fit after cancer treatments.  She was instructed to limit her alcohol consumption and continue to abstain from tobacco use.     5. Support services/counseling: It is not uncommon for this period of the patient's cancer care trajectory to be one of many emotions and stressors.  We discussed how this can be increasingly difficult during the times of quarantine and social distancing due to the COVID-19 pandemic.   She was given information regarding our available services and encouraged to contact me with any questions or for help enrolling in any of our support group/programs.    Follow up instructions:    -Return to cancer center in 6 months for f/u with Dr. Lindi Adie  -Left breast screening mammogram -Bone density testing -She is welcome to  return back to the Survivorship Clinic at any time; no additional follow-up needed at this time.  -Consider referral back to survivorship as a long-term survivor for continued surveillance  The patient was provided an opportunity to ask questions and all were answered. The patient agreed with the plan and demonstrated an understanding of the instructions.   Total encounter time: 30 minutes*  Wilber Bihari, NP 07/06/20 3:05 PM Medical Oncology and Hematology [facility] [place], [place] [address] Tel. [phone]    Fax. [phone]  *Total Encounter Time as defined by the Centers for Medicare and Medicaid Services includes, in addition to the face-to-face time of a patient visit (documented in the note above) non-face-to-face time: obtaining and reviewing outside history, ordering and reviewing medications, tests or procedures, care coordination (communications with other health care professionals or caregivers) and documentation in the medical record.

## 2020-07-16 ENCOUNTER — Other Ambulatory Visit: Payer: Self-pay | Admitting: Hematology and Oncology

## 2020-07-30 ENCOUNTER — Telehealth: Payer: Self-pay

## 2020-07-30 NOTE — Telephone Encounter (Signed)
Patient continues with numbness/tingling in fingers and toes.  Pt reports at times this is felt throughout both arms as well.  Pt has returned to work full time, 8 hours a day for 5 days a week.  Pt also is experiencing joint pain.  Pt states she feels "stiff" after sitting for long periods of time.    Pt currently on anastrozole therapy, daily.   RN reviewed with MD - MD recommendations to hold anastrozole X 2 weeks to see if symptoms improve.  Pt verbalized understanding and agreement.  Pt will call back to clinic.

## 2020-08-02 DIAGNOSIS — N950 Postmenopausal bleeding: Secondary | ICD-10-CM | POA: Insufficient documentation

## 2020-08-02 DIAGNOSIS — E782 Mixed hyperlipidemia: Secondary | ICD-10-CM | POA: Insufficient documentation

## 2020-08-02 DIAGNOSIS — A6000 Herpesviral infection of urogenital system, unspecified: Secondary | ICD-10-CM | POA: Insufficient documentation

## 2020-08-11 ENCOUNTER — Other Ambulatory Visit: Payer: Self-pay | Admitting: Hematology and Oncology

## 2020-08-15 ENCOUNTER — Ambulatory Visit (INDEPENDENT_AMBULATORY_CARE_PROVIDER_SITE_OTHER): Payer: No Typology Code available for payment source | Admitting: Family Medicine

## 2020-08-15 ENCOUNTER — Encounter: Payer: Self-pay | Admitting: Family Medicine

## 2020-08-15 ENCOUNTER — Other Ambulatory Visit: Payer: Self-pay

## 2020-08-15 VITALS — BP 128/83 | HR 72 | Temp 98.5°F | Ht 63.0 in | Wt 205.0 lb

## 2020-08-15 DIAGNOSIS — M25562 Pain in left knee: Secondary | ICD-10-CM | POA: Diagnosis not present

## 2020-08-15 DIAGNOSIS — M545 Low back pain, unspecified: Secondary | ICD-10-CM

## 2020-08-15 DIAGNOSIS — M79604 Pain in right leg: Secondary | ICD-10-CM | POA: Diagnosis not present

## 2020-08-15 DIAGNOSIS — M25552 Pain in left hip: Secondary | ICD-10-CM | POA: Diagnosis not present

## 2020-08-15 MED ORDER — MELOXICAM 7.5 MG PO TABS: 7.5000 mg | ORAL_TABLET | Freq: Every day | ORAL | 0 refills | Status: DC | PRN

## 2020-08-15 NOTE — Progress Notes (Signed)
Subjective:  Patient ID: Wendy [name], female    DOB: 06-Mar-1956  Age: 65 y.o. MRN: [id number]  CC:  Chief Complaint  Patient presents with  . body pain    Pt is reporting body aches,knee pain,hip pain, and lower back pain. Pt states this has been going on for a little over a month now mainly at bedtime. Pt reports no known trama to her body.current pain level 2/10.     HPI Wendy ABRAHA presents for    Body aches: L Knee pain, front of lower legs both sides. Left hip pain, middle low back pain.  About 9 months - noticed after finishing chemo. No known injury. Chemo last year, with radiation. Had bone pain at that time, d/w oncology. Started on gabapentin for peripheral neuropathy, then flexeril and tramadol from surgeon for chest soreness at surgery site. Lymphedema of R arm. Lasix prn for foot swelling - few days ago.  Had taken anastrozole afterward, and some blurred vision, discussed with Dr. Lindi Adie.  Trial off anastrozole past week. Blurry vision better.  Leg, knee, left hip and back pains similar.  Just started tramadol last night. Some improvement.  Flexeril every few nights.   LS spine XR in 2017: IMPRESSION: 1. Normal alignment with degenerative disc disease at L3-4 and L4-5. 2. Calcification of the sacrospinous ligaments in the pelvis. This can be seen with ankylosing spondylitis. Correlate clinically.  No hx of PUD, CKD.   History Patient Active Problem List   Diagnosis Date Noted  . S/P breast reconstruction, right 01/23/2020  . History of reduction surgery of left breast 01/23/2020  . Port-A-Cath in place 10/20/2019  . S/P mastectomy, right 10/19/2019  . Acquired absence of breast 09/16/2019  . Breast cancer ([place]) 09/08/2019  . Genetic testing 06/10/2019  . Malignant neoplasm of upper-outer quadrant of right breast in female, estrogen receptor positive ([place]) 06/01/2019  . Family history of breast cancer 06/01/2019  . Family history of colon cancer  06/01/2019  . Family history of brain cancer 06/01/2019  . History of colon cancer   . Abnormal colonoscopy 10/30/2017  . Chronic glaucoma 08/11/2013  . HELICOBACTER PYLORI INFECTION 08/27/2007  . GERD 08/27/2007  . ESOPHAGITIS, REFLUX 05/17/2007  . HEMORRHOIDS, INTERNAL 09/22/2003   Past Medical History:  Diagnosis Date  . Breast cancer ([place])   . Colon cancer ([place])   . DDD (degenerative disc disease), cervical   . DDD (degenerative disc disease), lumbar   . ESOPHAGITIS, REFLUX   . Glaucoma   . HELICOBACTER PYLORI INFECTION   . HEMORRHOIDS, INTERNAL   . History of colon cancer   . History of colon polyps 2019  . Hyperlipidemia   . Radius fracture 2006   Left, Distal  . Thoracic spondylosis   . Tinnitus, bilateral 2013   Past Surgical History:  Procedure Laterality Date  . BREAST LUMPECTOMY WITH RADIOACTIVE SEED AND SENTINEL LYMPH NODE BIOPSY Right 06/27/2019   Procedure: RIGHT BREAST LUMPECTOMY WITH RADIOACTIVE SEED AND SENTINEL LYMPH NODE BIOPSY AND RIGHT TARGETED NODE DISSECTION;  Surgeon: Jovita Kussmaul, MD;  Location: [facility];  Service: General;  Laterality: Right;  . BREAST RECONSTRUCTION WITH PLACEMENT OF TISSUE EXPANDER AND FLEX HD (ACELLULAR HYDRATED DERMIS) Right 09/08/2019   Procedure: BREAST RECONSTRUCTION WITH PLACEMENT OF TISSUE EXPANDER AND FLEX HD (ACELLULAR HYDRATED DERMIS);  Surgeon: Wallace Going, DO;  Location: [facility];  Service: Plastics;  Laterality: Right;  . BREAST REDUCTION WITH MASTOPEXY Left 01/02/2020  Procedure: MASTOPEXY / REDUCTION FOR SYMMETRY;  Surgeon: Wallace Going, DO;  Location: [facility];  Service: Plastics;  Laterality: Left;  . CESAREAN SECTION  1989  . COLONOSCOPY  2019  . HYSTEROSCOPY WITH D & C N/A 09/17/2018   Procedure: DILATATION AND CURETTAGE Marlene Bast;  Surgeon: Brien Few, MD;  Location: [facility];  Service: Gynecology;  Laterality:  N/A;  . PORT-A-CATH REMOVAL Left 01/02/2020   Procedure: REMOVAL PORT-A-CATH;  Surgeon: Wallace Going, DO;  Location: [facility];  Service: Plastics;  Laterality: Left;  . PORTACATH PLACEMENT Left 08/01/2019   Procedure: INSERTION PORT-A-CATH;  Surgeon: Jovita Kussmaul, MD;  Location: [facility];  Service: General;  Laterality: Left;  . RE-EXCISION OF BREAST CANCER,SUPERIOR MARGINS Right 08/12/2019   Procedure: RE-EXCISION OF RIGHT BREAST,SUPERIOR MARGINS;  Surgeon: Jovita Kussmaul, MD;  Location: [facility];  Service: General;  Laterality: Right;  . RE-EXCISION OF BREAST LUMPECTOMY Right 08/01/2019   Procedure: RE-EXCISION OF RIGHT BREAST CANCER  MARGINS;  Surgeon: Jovita Kussmaul, MD;  Location: [facility];  Service: General;  Laterality: Right;  . REMOVAL OF TISSUE EXPANDER AND PLACEMENT OF IMPLANT Right 01/02/2020   Procedure: REMOVAL OF TISSUE EXPANDER AND PLACEMENT OF IMPLANT;  Surgeon: Wallace Going, DO;  Location: [facility];  Service: Plastics;  Laterality: Right;  . SIMPLE MASTECTOMY WITH AXILLARY SENTINEL NODE BIOPSY Right 09/08/2019   Procedure: RIGHT BREAST MASTECTOMY;  Surgeon: Jovita Kussmaul, MD;  Location: [facility];  Service: General;  Laterality: Right;  . TONSILLECTOMY     age 65 years old  . WRIST FRACTURE SURGERY  2006   Allergies  Allergen Reactions  . Hydrocodone-Acetaminophen Itching and Other (See Comments)    SEVERE    . Oxycodone Itching  . Other Other (See Comments)    rakuten eyedrops causes eye redness   . Vicodin [Hydrocodone-Acetaminophen] Itching    SEVERE   . Brimonidine Rash   Prior to Admission medications   Medication Sig Start Date End Date Taking? Authorizing Provider  anastrozole (ARIMIDEX) 1 MG tablet Take 1 tablet (1 mg total) by mouth daily. 03/27/20  Yes Nicholas Lose, MD  dorzolamide-timolol (COSOPT) 22.3-6.8 MG/ML ophthalmic solution PLACE 1 DROP  INTO BOTH EYES 2 (TWO) TIMES DAILY. 09/28/13  Yes [provider]  furosemide (LASIX) 20 MG tablet TAKE 1 TABLET BY MOUTH TWICE A DAY 08/13/20  Yes Nicholas Lose, MD  gabapentin (NEURONTIN) 300 MG capsule TAKE 1 CAPSULE BY MOUTH THREE TIMES A DAY 06/21/20  Yes Nicholas Lose, MD  latanoprost (XALATAN) 0.005 % ophthalmic solution 1 drop at bedtime.    Yes [provider]  triamcinolone ointment (KENALOG) 0.5 % APPLY TO AFFECTED AREA TWICE A DAY 06/22/20  Yes Causey, Charlestine Massed, NP  venlafaxine (EFFEXOR) 37.5 MG tablet Take 1 tablet (37.5 mg total) by mouth daily. 06/27/20  Yes Nicholas Lose, MD  prochlorperazine (COMPAZINE) 10 MG tablet Take 1 tablet (10 mg total) by mouth every 6 (six) hours as needed (Nausea or vomiting). Patient not taking: Reported on 01/10/2020 07/22/19 01/19/20  Nicholas Lose, MD   Social History   Socioeconomic History  . Marital status: Significant Other    Spouse name: Not on file  . Number of children: Not on file  . Years of education: Not on file  . Highest education level: Not on file  Occupational History  . Occupation: Sport and exercise psychologist:  [employer]  Tobacco Use  . Smoking status: Never Smoker  . Smokeless tobacco: Never Used  Substance and Sexual Activity  . Alcohol use: Yes    Comment: occ  . Drug use: No  . Sexual activity: Not Currently  Other Topics Concern  . Not on file  Social History Narrative  . Not on file   Social Determinants of Health   Financial Resource Strain: Not on file  Food Insecurity: Not on file  Transportation Needs: Not on file  Physical Activity: Not on file  Stress: Not on file  Social Connections: Not on file  Intimate Partner Violence: Not on file    Review of Systems  Per HPI.  Objective:   Vitals:   08/15/20 1528  BP: 128/83  Pulse: 72  Temp: 98.5 F (36.9 C)  TempSrc: Temporal  SpO2: 98%  Weight: 205 lb (93 kg)  Height: 5\' 3"  (1.6 m)     Physical  Exam Constitutional:      General: She is not in acute distress.    Appearance: She is well-developed and well-nourished.  HENT:     Head: Normocephalic and atraumatic.  Cardiovascular:     Rate and Rhythm: Normal rate.  Pulmonary:     Effort: Pulmonary effort is normal.  Musculoskeletal:     Comments: Lumbar spine, no focal bony tenderness, reports area of discomfort at lower midline lumbar spine but nontender.  Sciatic notch, SI joint is nontender.  Negative seated straight leg raise.  Left hip, tender to palpation slightly over trochanteric bursa.  No focal bony tenderness otherwise.  Pain-free internal and external rotation.  Ambulate without difficulty.  Left knee, slight tenderness over medial and lateral joint lines but full range of motion.  Minimal tenderness over soft tissues just distally to the anterior tib-fib.  No focal bony tenderness.  Right tib-fib, slight discomfort over the upper medial tibia/soft tissue.  Skin intact, no ecchymosis.  Neurological:     Mental Status: She is alert and oriented to person, place, and time.  Psychiatric:        Mood and Affect: Mood and affect normal.        Assessment & Plan:  Wendy [name] is a 65 y.o. female . Midline low back pain without sciatica, unspecified chronicity - Plan: meloxicam (MOBIC) 7.5 MG tablet, DG Lumbar Spine 2-3 Views, CANCELED: DG Lumbar Spine 2-3 Views  Left knee pain, unspecified chronicity - Plan: meloxicam (MOBIC) 7.5 MG tablet, DG Knee 1-2 Views Left, CANCELED: DG Knee 1-2 Views Left  Left hip pain - Plan: meloxicam (MOBIC) 7.5 MG tablet, DG HIP UNILAT W OR W/O PELVIS 2-3 VIEWS LEFT, CANCELED: DG HIP UNILAT W OR W/O PELVIS 2-3 VIEWS LEFT  Right leg pain - Plan: meloxicam (MOBIC) 7.5 MG tablet, DG Tibia/Fibula Right, CANCELED: DG Tibia/Fibula Right  Possible flare of underlying arthritis with decreased activity/deconditioning with previous chemo, radiation, treatment.  Minimal change with holding  anastrozole.  Prior spondylosis noted in lumbar spine few years ago.  Updated imaging ordered as well as for left knee pain, hip pain and lower leg pain.  Lateral hip pain may be related more to trochanteric bursitis.  Possible degenerative joint disease of knee and possible referred pain from right knee.  -Imaging as above, trial of meloxicam 7.5 mg daily.  RTC precautions, and to be determined from imaging results.  1 month follow-up.  Meds ordered this encounter  Medications  . meloxicam (MOBIC) 7.5 MG tablet  Sig: Take 1 tablet (7.5 mg total) by mouth daily as needed for pain.    Dispense:  30 tablet    Refill:  0   Patient Instructions     Joint aches, back pain may be related to previous treatments, or more likely possible arthritis with some worsening.  Can try meloxicam once per day for the next few weeks.  Make sure that is okay with your oncologist.  X-rays at [place], [address].  Follow-up in the next month but let me know if there are questions sooner or if any worsening symptoms sooner.  If you have lab work done today you will be contacted with your lab results within the next 2 weeks.  If you have not heard from [place] then please contact us. The fastest way to get your results is to register for My Chart.   IF you received an x-ray today, you will receive an invoice from [facility]. Please contact [facility] at [phone] with questions or concerns regarding your invoice.   IF you received labwork today, you will receive an invoice from [facility]. Please contact [facility] at [phone] with questions or concerns regarding your invoice.   Our billing staff will not be able to assist you with questions regarding bills from these companies.  You will be contacted with the lab results as soon as they are available. The fastest way to get your results is to activate your My Chart account. Instructions are located on the last page of this  paperwork. If you have not heard from [place] regarding the results in 2 weeks, please contact this office.         Signed, [name], MD [facility]

## 2020-08-15 NOTE — Patient Instructions (Addendum)
   Joint aches, back pain may be related to previous treatments, or more likely possible arthritis with some worsening.  Can try meloxicam once per day for the next few weeks.  Make sure that is okay with your oncologist.  X-rays at [place], [address].  Follow-up in the next month but let me know if there are questions sooner or if any worsening symptoms sooner.  If you have lab work done today you will be contacted with your lab results within the next 2 weeks.  If you have not heard from [place] then please contact us. The fastest way to get your results is to register for My Chart.   IF you received an x-ray today, you will receive an invoice from [facility]. Please contact [facility] at [phone] with questions or concerns regarding your invoice.   IF you received labwork today, you will receive an invoice from [facility]. Please contact [facility] at [phone] with questions or concerns regarding your invoice.   Our billing staff will not be able to assist you with questions regarding bills from these companies.  You will be contacted with the lab results as soon as they are available. The fastest way to get your results is to activate your My Chart account. Instructions are located on the last page of this paperwork. If you have not heard from [place] regarding the results in 2 weeks, please contact this office.

## 2020-08-17 ENCOUNTER — Telehealth: Payer: Self-pay | Admitting: *Deleted

## 2020-08-17 NOTE — Telephone Encounter (Signed)
Received call from pt stating she has been on hold with her anastrozole b/c of blurred vision, numbness/tingling of arms/legs/bone pain.  She reports continuing to hold anastrazole.  Blurred vision improved & is gone.  She is still having bone pain in her legs & numbness/tingling continues.  She reports about the same. She has seen Dr [name] & he gave her tramadol & flexeril for tightness in her chest related to her surgery.  She has seen her PCP-Dr Nyoka Cowden who gave her meloxicam & noted tenderness in her legs & plans to do x-rays of knees, legs, hip & lower back.  She reports she will also schedule bone density.  She is on neurontin daily at hs.  Message to Dr Lindi Adie.

## 2020-08-31 ENCOUNTER — Other Ambulatory Visit: Payer: Self-pay | Admitting: Hematology and Oncology

## 2020-09-07 ENCOUNTER — Encounter: Payer: Self-pay | Admitting: Family Medicine

## 2020-09-07 ENCOUNTER — Telehealth: Payer: Self-pay | Admitting: Family Medicine

## 2020-09-07 LAB — HM MAMMOGRAPHY

## 2020-09-07 IMAGING — MR MR BREAST BILAT WO/W CM
7 of 12 series · 27 of 48 positions shown · IV contrast (gadavist)
Comparison: Previous exam(s).

CLINICAL DATA: Patient with malignancy the upper-outer quadrant of
the right breast. She has undergone surgical excision followed by
re-excision an additional re-excision performed on 08/12/2019, for
positive margins. Evaluate for residual/extent of disease.

LABS:  No labs drawn at time of imaging.
EXAM:
BILATERAL BREAST MRI WITH AND WITHOUT CONTRAST
TECHNIQUE: Multiplanar, multisequence MR images of both breasts were obtained
prior to and following the intravenous administration of 10 ml of
Gadavist

[Series 2: T2 · axial · 3.0mm · 0.94mm/px · z∈[-100,+100]mm · 4 of 68 slices shown]
[im 1/68]
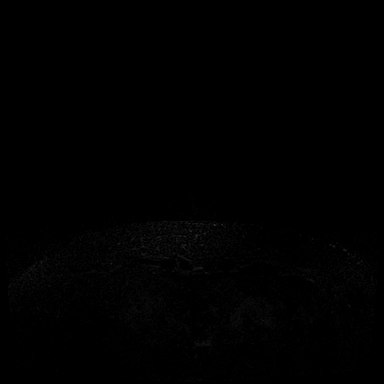
[im 23/68]
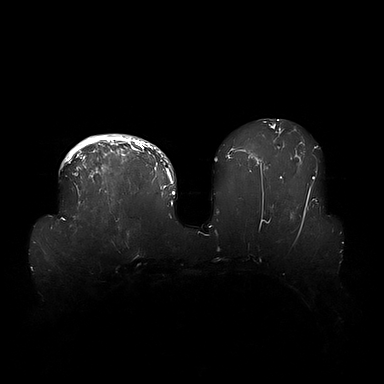
[im 45/68]
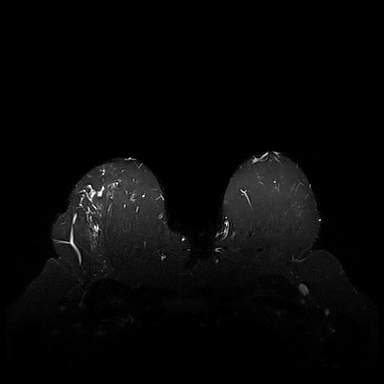
[im 68/68]
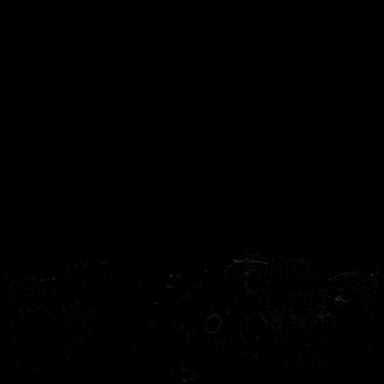

[Series 3: T1 fat-sat · axial · 1.2mm · 0.80mm/px · z∈[-95,+95]mm · 6 of 151 slices shown (1 of 4)]
[im 1/151]
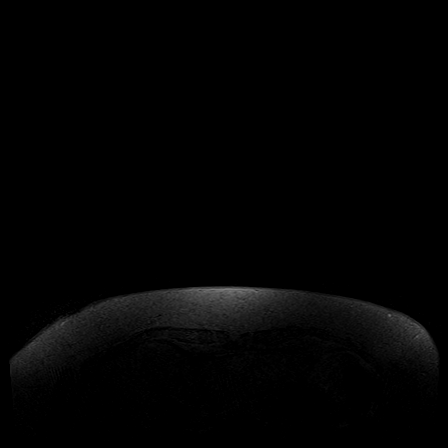
[im 31/151]
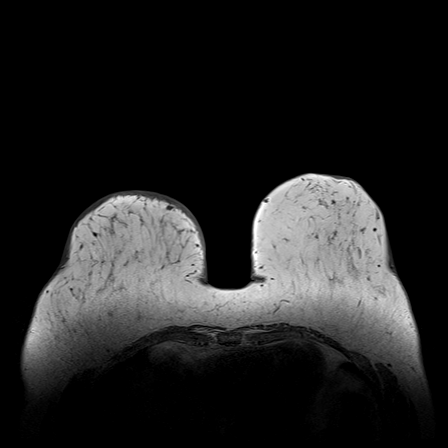
[im 61/151]
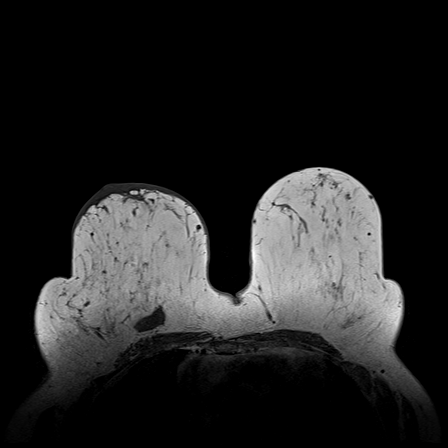
[im 91/151]
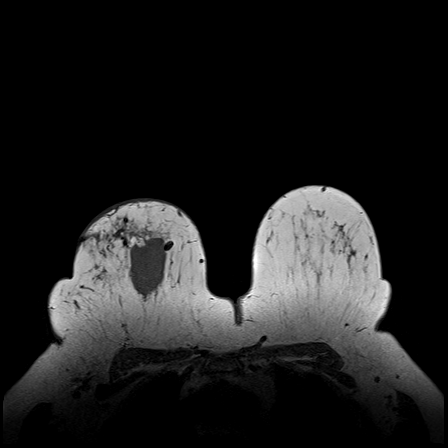
[im 121/151]
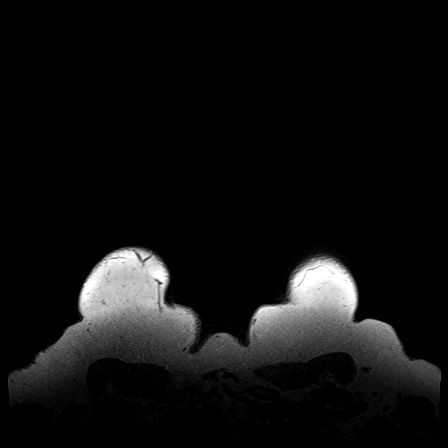
[im 151/151]
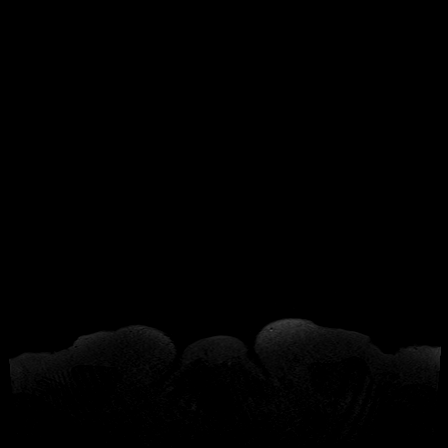

[Series 5: T1 fat-sat · axial · 1.6mm · 0.87mm/px · z∈[-102,+102]mm · 5 of 128 slices shown (2 of 4)]
[im 1/128]
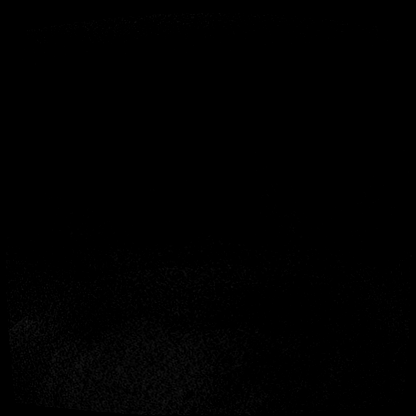
[im 32/128]
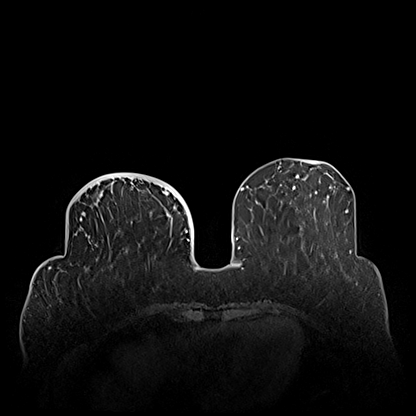
[im 64/128]
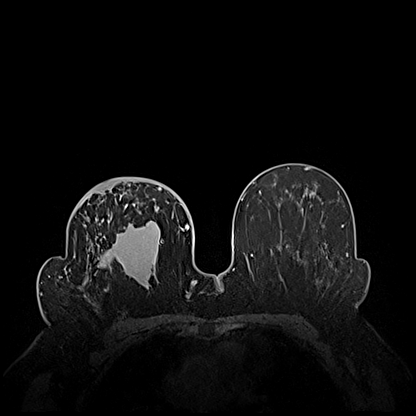
[im 96/128]
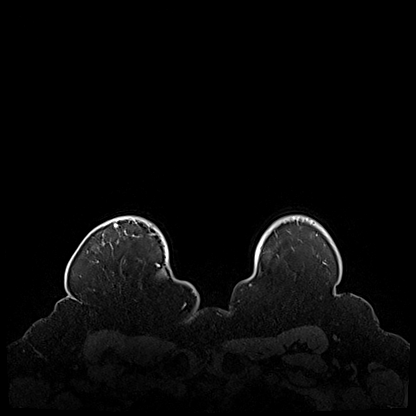
[im 128/128]
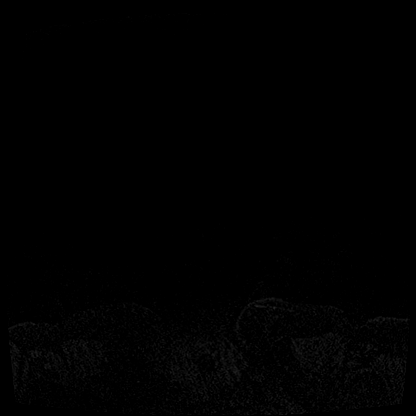

[Series 6: T1 fat-sat · axial · 1.6mm · 0.87mm/px · z∈[-102,+102]mm · 5 of 128 slices shown (3 of 4)]
[im 1/128]
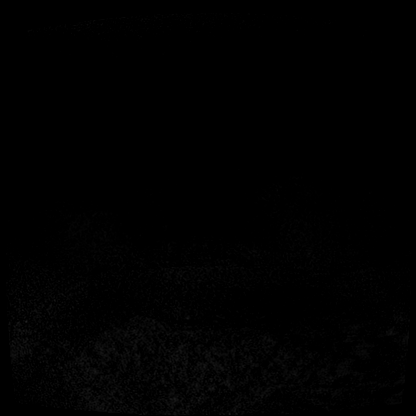
[im 32/128]
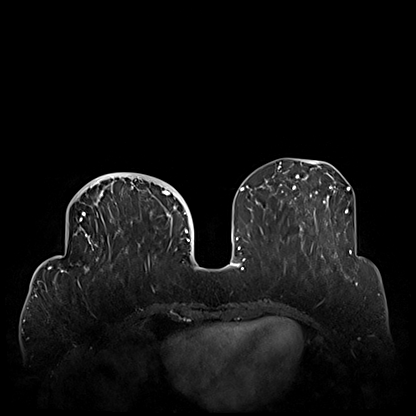
[im 64/128]
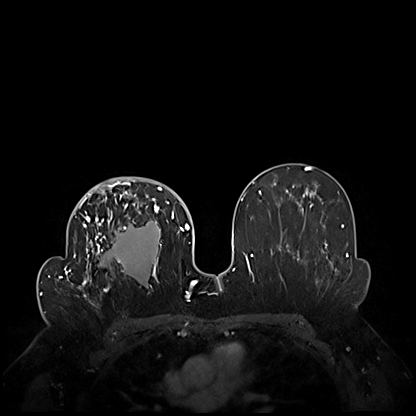
[im 96/128]
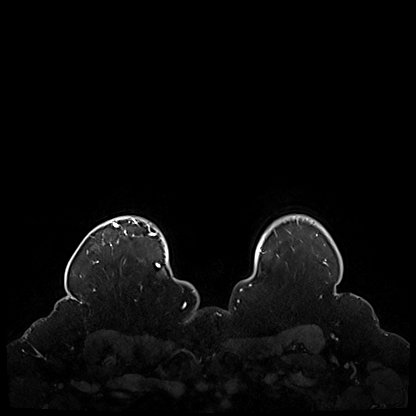
[im 128/128]
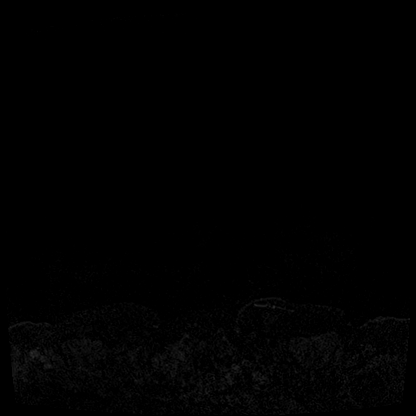

[Series 7: T1 · axial · 1.6mm · 0.87mm/px · z∈[-102,+102]mm · 5 of 128 slices shown (1 of 2)]
[im 1/128]
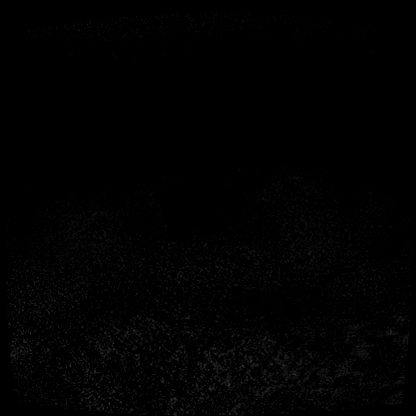
[im 32/128]
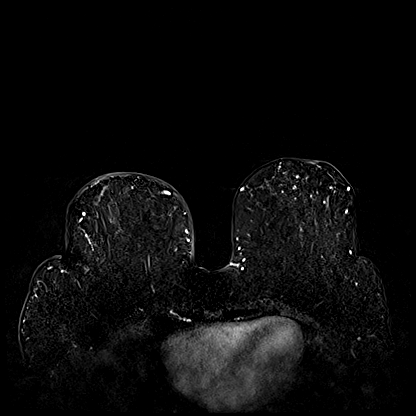
[im 64/128]
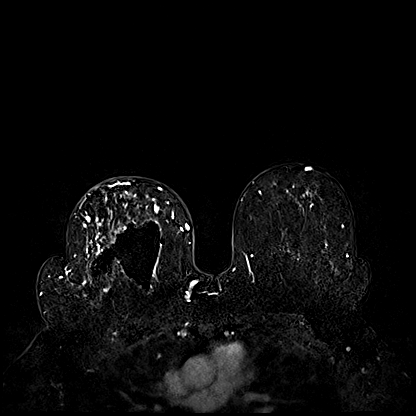
[im 96/128]
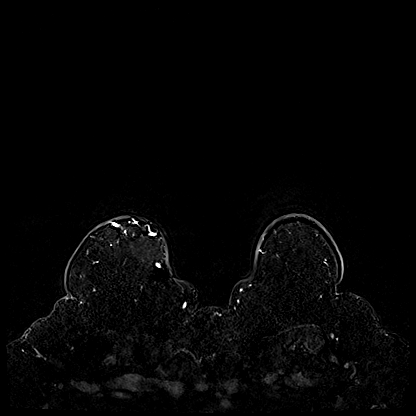
[im 128/128]
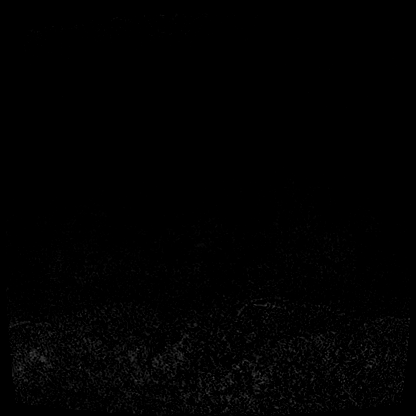

[Series 9: T1 · axial · 204.8mm · 0.87mm/px · 1 of 1 slices shown (2 of 2)]
[im 1/1]
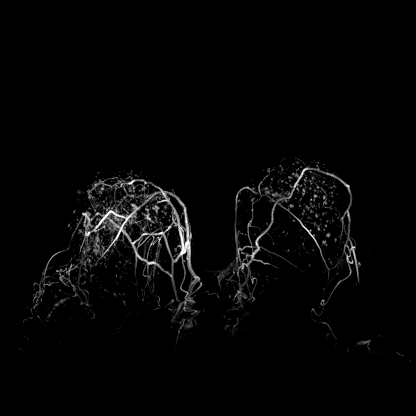

[Series 10: T1 fat-sat · axial · 1.6mm · 0.87mm/px · 1 of 128 slices shown (4 of 4)]
[im 1/128]
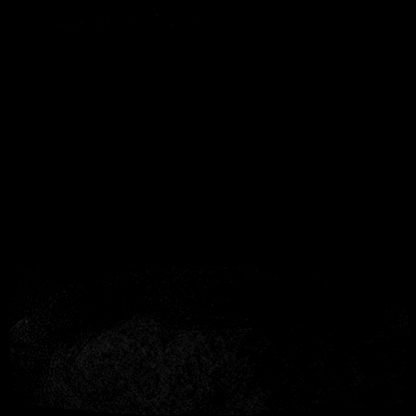

[27 of 48 positions shown; findings below may reference images not displayed]

Three-dimensional MR images were rendered by post-processing of the
original MR data on an independent workstation. The
three-dimensional MR images were interpreted, and findings are
reported in the following complete MRI report for this study. Three
dimensional images were evaluated at the independent [employer]
workstation
FINDINGS: Breast composition: b. Scattered fibroglandular tissue.

Background parenchymal enhancement: Moderate.

Right breast: There is a post surgical collection consistent with a
seroma in the central breast spanning 6.7 x 3.7 x 5.5 cm. There is
non masslike enhancement along the anterior superior and
superolateral margins of the post lumpectomy seroma, with the
enhancement most prominent laterally. The non mass enhancement spans
approximately 6 cm anterior-posterior along the lateral margin of
the seroma, by 2.7 cm right to left. The enhancement along the
anterior superior margin is less prominent extending only 8-9 mm
anterior to the seroma.

There is a small area of peripheral or rim enhancement spanning
cm in the anterior aspect of the right breast, image 93, series 7,
with internal fat signal, likely an area fat necrosis. This lies
just underneath the skin.

There are no other areas of abnormal enhancement. The overlying skin
is thickened consistent with postsurgical edema.

Left breast: No mass or abnormal enhancement.

Lymph nodes: Susceptibility artifact is associated with mild
enhancement, adjacent to several normal sized right axillary lymph
nodes consistent with changes from axillary lymph node excision. No
enlarged or abnormal axillary lymph nodes on either side.

Ancillary findings:  None.
IMPRESSION: 1. There is non mass enhancement along the margins of a post
surgical seroma in the right breast, as detailed above, suspicious
for areas of residual carcinoma. Some of the abnormal enhancement is
presumably due to postoperative change.
2. No evidence of left breast malignancy.
3. No enlarged or abnormal appearing lymph nodes. Postoperative
changes noted along the right axilla.

RECOMMENDATION:
1. Consider MRI guided biopsy of the area of non mass enhancement
along the lateral margin of the postoperative seroma, if additional
re-excision is not planned. Since some of the enhancement is likely
postsurgical in origin, short-term follow-up breast MRI to reassess
for residual disease, with MRI performed in 2-3 months, should be
considered.

BI-RADS CATEGORY  4: Suspicious.

## 2020-09-07 NOTE — Telephone Encounter (Signed)
Patient wasa calling because her x-Ray on my chart was cancelled / wants to know if she needs to keep the appt on Monday  with out x- ray  ?   Please advise

## 2020-09-10 ENCOUNTER — Ambulatory Visit (INDEPENDENT_AMBULATORY_CARE_PROVIDER_SITE_OTHER): Payer: No Typology Code available for payment source | Admitting: Family Medicine

## 2020-09-10 ENCOUNTER — Other Ambulatory Visit: Payer: Self-pay

## 2020-09-10 ENCOUNTER — Ambulatory Visit
Admission: RE | Admit: 2020-09-10 | Discharge: 2020-09-10 | Disposition: A | Discharge status: Home / Self Care | Payer: No Typology Code available for payment source | Source: Ambulatory Visit | Attending: Family Medicine | Admitting: Family Medicine

## 2020-09-10 ENCOUNTER — Encounter: Payer: Self-pay | Admitting: Family Medicine

## 2020-09-10 VITALS — BP 119/81 | HR 74 | Temp 98.2°F | Ht 63.0 in | Wt 205.0 lb

## 2020-09-10 DIAGNOSIS — M25552 Pain in left hip: Secondary | ICD-10-CM

## 2020-09-10 DIAGNOSIS — M25562 Pain in left knee: Secondary | ICD-10-CM

## 2020-09-10 DIAGNOSIS — M545 Low back pain, unspecified: Secondary | ICD-10-CM

## 2020-09-10 DIAGNOSIS — M79604 Pain in right leg: Secondary | ICD-10-CM

## 2020-09-10 MED ORDER — MELOXICAM 7.5 MG PO TABS: 7.5000 mg | ORAL_TABLET | Freq: Every day | ORAL | 0 refills | Status: DC | PRN

## 2020-09-10 NOTE — Patient Instructions (Addendum)
   Thank you for coming in today.  Okay to continue meloxicam as needed up to once per day.  Sore areas are not continuing to improve I would recommend physical therapy or orthopedic evaluation.  X-ray ordered should still be available but let me know if I need to reorder those.  If any worsening symptoms or questions please let me know.   If you have lab work done today you will be contacted with your lab results within the next 2 weeks.  If you have not heard from [place] then please contact us. The fastest way to get your results is to register for My Chart.   IF you received an x-ray today, you will receive an invoice from [facility]. Please contact [facility] at [phone] with questions or concerns regarding your invoice.   IF you received labwork today, you will receive an invoice from [place]. Please contact [facility] at [phone] with questions or concerns regarding your invoice.   Our billing staff will not be able to assist you with questions regarding bills from these companies.  You will be contacted with the lab results as soon as they are available. The fastest way to get your results is to activate your My Chart account. Instructions are located on the last page of this paperwork. If you have not heard from [place] regarding the results in 2 weeks, please contact this office.

## 2020-09-10 NOTE — Progress Notes (Signed)
Subjective:  Patient ID: Wendy [name], female    DOB: 01/07/1956  Age: 65 y.o. MRN: [id number]  CC:  Chief Complaint  Patient presents with   Follow-up    On midline lower back pain, L hip pain,L Knee pain, and R leg pain. Pt reports since last OV her pain has gotten a little better.  Current pain level a 5/10 in her R ankle no other pain currently.    HPI Wendy [name] presents for  Follow-up of polyarthralgias, midline low back pain, left hip pain, left knee pain, right leg pain.  Last discussed February 9.  Pain noted after chemotherapy.  Treated previously for peripheral neuropathy with gabapentin, Flexeril, tramadol.  Minimal change with trial off anastrozole.  Has been using Flexeril every few nights, and just started tramadol night before her last visit.  Some improvement.  Possible flare of underlying arthritis with decreased activity/deconditioning with previous chemo, radiation and breast cancer treatment.  Updated imaging ordered last visit as well as meloxicam 7.5 mg QD. Feels a little better on mobic - on.y 2-3 times per week. Pain is still same areas. Not limiting ADL's.  Has not had xrays performed - concern of whether they were still ordered.  Has not yet restarted anastrozole.  Has bone density scheduled this Wednesday.     History Patient Active Problem List   Diagnosis Date Noted   S/P breast reconstruction, right 01/23/2020   History of reduction surgery of left breast 01/23/2020   Port-A-Cath in place 10/20/2019   S/P mastectomy, right 10/19/2019   Acquired absence of breast 09/16/2019   Breast cancer ([place]) 09/08/2019   Genetic testing 06/10/2019   Malignant neoplasm of upper-outer quadrant of right breast in female, estrogen receptor positive ([place]) 06/01/2019   Family history of breast cancer 06/01/2019   Family history of colon cancer 06/01/2019   Family history of brain cancer 06/01/2019   History of colon cancer    Abnormal  colonoscopy 10/30/2017   Chronic glaucoma 10/93/2355   HELICOBACTER PYLORI INFECTION 08/27/2007   GERD 08/27/2007   ESOPHAGITIS, REFLUX 05/17/2007   HEMORRHOIDS, INTERNAL 09/22/2003   Past Medical History:  Diagnosis Date   Breast cancer ([place])    Colon cancer ([place])    DDD (degenerative disc disease), cervical    DDD (degenerative disc disease), lumbar    ESOPHAGITIS, REFLUX    Glaucoma    HELICOBACTER PYLORI INFECTION    HEMORRHOIDS, INTERNAL    History of colon cancer    History of colon polyps 2019   Hyperlipidemia    Radius fracture 2006   Left, Distal   Thoracic spondylosis    Tinnitus, bilateral 2013   Past Surgical History:  Procedure Laterality Date   BREAST LUMPECTOMY WITH RADIOACTIVE SEED AND SENTINEL LYMPH NODE BIOPSY Right 06/27/2019   Procedure: RIGHT BREAST LUMPECTOMY WITH RADIOACTIVE SEED AND SENTINEL LYMPH NODE BIOPSY AND RIGHT TARGETED NODE DISSECTION;  Surgeon: Jovita Kussmaul, MD;  Location: [facility];  Service: General;  Laterality: Right;   BREAST RECONSTRUCTION WITH PLACEMENT OF TISSUE EXPANDER AND FLEX HD (ACELLULAR HYDRATED DERMIS) Right 09/08/2019   Procedure: BREAST RECONSTRUCTION WITH PLACEMENT OF TISSUE EXPANDER AND FLEX HD (ACELLULAR HYDRATED DERMIS);  Surgeon: Wallace Going, DO;  Location: [facility];  Service: Plastics;  Laterality: Right;   BREAST REDUCTION WITH MASTOPEXY Left 01/02/2020   Procedure: MASTOPEXY / REDUCTION FOR SYMMETRY;  Surgeon: Wallace Going, DO;  Location: [facility];  Service: Plastics;  Laterality: Left;   CESAREAN SECTION  1989   COLONOSCOPY  2019   HYSTEROSCOPY WITH D & C N/A 09/17/2018   Procedure: DILATATION AND CURETTAGE Marlene Bast;  Surgeon: Brien Few, MD;  Location: [place] [facility];  Service: Gynecology;  Laterality: N/A;   PORT-A-CATH REMOVAL Left 01/02/2020   Procedure: REMOVAL PORT-A-CATH;  Surgeon: Wallace Going, DO;  Location: [facility];  Service: Plastics;  Laterality: Left;   PORTACATH PLACEMENT Left 08/01/2019   Procedure: INSERTION PORT-A-CATH;  Surgeon: Jovita Kussmaul, MD;  Location: [facility];  Service: General;  Laterality: Left;   RE-EXCISION OF BREAST CANCER,SUPERIOR MARGINS Right 08/12/2019   Procedure: RE-EXCISION OF RIGHT BREAST,SUPERIOR MARGINS;  Surgeon: Jovita Kussmaul, MD;  Location: [facility];  Service: General;  Laterality: Right;   RE-EXCISION OF BREAST LUMPECTOMY Right 08/01/2019   Procedure: RE-EXCISION OF RIGHT BREAST CANCER  MARGINS;  Surgeon: Jovita Kussmaul, MD;  Location: [place];  Service: General;  Laterality: Right;   REMOVAL OF TISSUE EXPANDER AND PLACEMENT OF IMPLANT Right 01/02/2020   Procedure: REMOVAL OF TISSUE EXPANDER AND PLACEMENT OF IMPLANT;  Surgeon: Wallace Going, DO;  Location: [facility];  Service: Plastics;  Laterality: Right;   SIMPLE MASTECTOMY WITH AXILLARY SENTINEL NODE BIOPSY Right 09/08/2019   Procedure: RIGHT BREAST MASTECTOMY;  Surgeon: Jovita Kussmaul, MD;  Location: [place];  Service: General;  Laterality: Right;   TONSILLECTOMY     age 65 years old   WRIST FRACTURE SURGERY  2006   Allergies  Allergen Reactions   Hydrocodone-Acetaminophen Itching and Other (See Comments)    SEVERE     Oxycodone Itching   Other Other (See Comments)    rakuten eyedrops causes eye redness    Vicodin [Hydrocodone-Acetaminophen] Itching    SEVERE    Brimonidine Rash   Prior to Admission medications   Medication Sig Start Date End Date Taking? Authorizing Provider  dorzolamide-timolol (COSOPT) 22.3-6.8 MG/ML ophthalmic solution PLACE 1 DROP INTO BOTH EYES 2 (TWO) TIMES DAILY. 09/28/13  Yes [provider]  furosemide (LASIX) 20 MG tablet TAKE 1 TABLET BY MOUTH TWICE A DAY 08/13/20  Yes Nicholas Lose, MD  gabapentin (NEURONTIN) 300 MG capsule  TAKE 1 CAPSULE BY MOUTH THREE TIMES A DAY 08/31/20  Yes Nicholas Lose, MD  latanoprost (XALATAN) 0.005 % ophthalmic solution 1 drop at bedtime.    Yes [provider]  meloxicam (MOBIC) 7.5 MG tablet Take 1 tablet (7.5 mg total) by mouth daily as needed for pain. 08/15/20  Yes [name], MD  triamcinolone ointment (KENALOG) 0.5 % APPLY TO AFFECTED AREA TWICE A DAY 06/22/20  Yes Causey, Charlestine Massed, NP  venlafaxine (EFFEXOR) 37.5 MG tablet Take 1 tablet (37.5 mg total) by mouth daily. 06/27/20  Yes Nicholas Lose, MD  anastrozole (ARIMIDEX) 1 MG tablet Take 1 tablet (1 mg total) by mouth daily. Patient not taking: Reported on 09/10/2020 03/27/20   Nicholas Lose, MD  prochlorperazine (COMPAZINE) 10 MG tablet Take 1 tablet (10 mg total) by mouth every 6 (six) hours as needed (Nausea or vomiting). Patient not taking: Reported on 01/10/2020 07/22/19 01/19/20  Nicholas Lose, MD   Social History   Socioeconomic History   Marital status: Significant Other    Spouse name: Not on file   Number of children: Not on file   Years of education: Not on file   Highest education level: Not on file  Occupational History   Occupation: Sport and exercise psychologist: [employer]  Tobacco Use   Smoking status: Never Smoker   Smokeless tobacco: Never Used  Substance and Sexual Activity   Alcohol use: Yes    Comment: occ   Drug use: No   Sexual activity: Not Currently  Other Topics Concern   Not on file  Social History Narrative   Not on file   Social Determinants of Health   Financial Resource Strain: Not on file  Food Insecurity: Not on file  Transportation Needs: Not on file  Physical Activity: Not on file  Stress: Not on file  Social Connections: Not on file  Intimate Partner Violence: Not on file    Review of Systems  Per HPI.   Objective:   Vitals:   09/10/20 1517  BP: 119/81  Pulse: 74  Temp: 98.2 F (36.8 C)  TempSrc: Temporal  SpO2: 99%  Weight:  205 lb (93 kg)  Height: 5\' 3"  (1.6 m)     Physical Exam Constitutional:      General: She is not in acute distress.    Appearance: She is well-developed and well-nourished.  HENT:     Head: Normocephalic and atraumatic.  Cardiovascular:     Rate and Rhythm: Normal rate.  Pulmonary:     Effort: Pulmonary effort is normal.  Musculoskeletal:     Comments: Lumbar spine, no midline bony tenderness, no focal tenderness but locates previous area of discomfort around the lower lumbar spine, left paraspinals.  Ambulates without difficulty.  Left hip, no focal tenderness along trochanteric bursa but slight discomfort lateral hip with internal rotation.  Pain-free external rotation.  Left knee pain-free range of motion, no focal bony tenderness.  Right tib-fib, slight discomfort anterior -medial tibial surface upper third.  No apparent focal soft tissue swelling or defect appreciated.    Neurological:     Mental Status: She is alert and oriented to person, place, and time.  Psychiatric:        Mood and Affect: Mood and affect normal.        Assessment & Plan:  Wendy [name] is a 65 y.o. female . Left hip pain  Left knee pain, unspecified chronicity  Midline low back pain without sciatica, unspecified chronicity  Right leg pain  Improved with episodic meloxicam, okay to continue same.  Plans on obtaining imaging ordered last visit.  Still suspect component of deconditioning versus worsening of previous degenerative changes.  Consider PT versus Ortho eval if persistent.   No orders of the defined types were placed in this encounter.  Patient Instructions     Thank you for coming in today.  Okay to continue meloxicam as needed up to once per day.  Sore areas are not continuing to improve I would recommend physical therapy or orthopedic evaluation.  X-ray ordered should still be available but let me know if I need to reorder those.  If any worsening symptoms or questions  please let me know.  If you have lab work done today you will be contacted with your lab results within the next 2 weeks.  If you have not heard from [place] then please contact us. The fastest way to get your results is to register for My Chart.   IF you received an x-ray today, you will receive an invoice from [facility]. Please contact [facility] at [phone] with questions or concerns regarding your invoice.   IF you received labwork today, you will  receive an invoice from [facility]. Please contact [facility] at [phone] with questions or concerns regarding your invoice.   Our billing staff will not be able to assist you with questions regarding bills from these companies.  You will be contacted with the lab results as soon as they are available. The fastest way to get your results is to activate your My Chart account. Instructions are located on the last page of this paperwork. If you have not heard from [place] regarding the results in 2 weeks, please contact this office.         Signed, [name], MD [facility]

## 2020-09-11 NOTE — Telephone Encounter (Signed)
Xrays done

## 2020-09-12 ENCOUNTER — Ambulatory Visit
Admission: RE | Admit: 2020-09-12 | Discharge: 2020-09-12 | Disposition: A | Discharge status: Home / Self Care | Payer: No Typology Code available for payment source | Source: Ambulatory Visit | Attending: Adult Health | Admitting: Adult Health

## 2020-09-12 ENCOUNTER — Other Ambulatory Visit: Payer: Self-pay

## 2020-09-12 DIAGNOSIS — E2839 Other primary ovarian failure: Secondary | ICD-10-CM

## 2020-09-19 ENCOUNTER — Telehealth: Payer: Self-pay | Admitting: *Deleted

## 2020-09-19 NOTE — Telephone Encounter (Signed)
Received call from pt stating she has stopped Anastrozole x1 month and continues to experience intermittent blurry vision, joint pain and peripheral numbness and tingling.  Pt states she was seen by her PCP and had x-rays preformed which were WNL.  Pt requesting to be seen by MD for thoughts on blurry vision and possible continuation of Anastrozole or change in therapy.  Apt scheduled and pt verbalized understanding of date and time.

## 2020-09-19 NOTE — Assessment & Plan Note (Signed)
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  Treatment plan: 1.Adjuvant chemotherapy withTaxotere and Cytoxan every 3 weeks x4completed 12/15/2019 2.Adjuvant radiation therapycompleted 03/16/2020 3.Follow-up adjuvant antiestrogen therapywith anastrozole 1 mg daily x7 years Genetic testing ------------------------------------------------------------------------------------------------------ Treatment plan: Plan for adjuvant antiestrogen therapy with anastrozole x7 years  Bilateral lower extremity swelling: 01/26/2020: Echocardiogram EF 60 to 65% 01/21/2020: Ultrasound lower extremity: No DVT Continue with Lasix as needed  Chemo-induced peripheral neuropathy: Encouraged her to take gabapentin at bedtime Anastrozole Toxicities:  Breast Cancer Surveillance: 1. Breast Exam: 09/20/20: Benign 2. Mammogram:   RTC in 1 year

## 2020-09-20 ENCOUNTER — Inpatient Hospital Stay
Payer: No Typology Code available for payment source | Attending: Hematology and Oncology | Admitting: Hematology and Oncology

## 2020-09-20 ENCOUNTER — Other Ambulatory Visit: Payer: Self-pay

## 2020-09-20 DIAGNOSIS — Z9221 Personal history of antineoplastic chemotherapy: Secondary | ICD-10-CM | POA: Diagnosis not present

## 2020-09-20 DIAGNOSIS — N644 Mastodynia: Secondary | ICD-10-CM | POA: Insufficient documentation

## 2020-09-20 DIAGNOSIS — Z923 Personal history of irradiation: Secondary | ICD-10-CM | POA: Diagnosis not present

## 2020-09-20 DIAGNOSIS — G620 Drug-induced polyneuropathy: Secondary | ICD-10-CM | POA: Insufficient documentation

## 2020-09-20 DIAGNOSIS — Z9011 Acquired absence of right breast and nipple: Secondary | ICD-10-CM | POA: Insufficient documentation

## 2020-09-20 DIAGNOSIS — C50411 Malignant neoplasm of upper-outer quadrant of right female breast: Secondary | ICD-10-CM | POA: Diagnosis not present

## 2020-09-20 DIAGNOSIS — Z170 Estrogen receptor positive status [ER+]: Secondary | ICD-10-CM | POA: Diagnosis not present

## 2020-09-20 DIAGNOSIS — H538 Other visual disturbances: Secondary | ICD-10-CM | POA: Insufficient documentation

## 2020-09-20 MED ORDER — GABAPENTIN 300 MG PO CAPS: ORAL_CAPSULE | ORAL | 3 refills | Status: DC

## 2020-09-20 MED ORDER — VENLAFAXINE HCL 37.5 MG PO TABS: 37.5000 mg | ORAL_TABLET | Freq: Every day | ORAL | 3 refills | Status: DC

## 2020-09-20 NOTE — Progress Notes (Signed)
Patient Care Team: [name], Wendy [name] as PCP - General (Family Medicine) Jovita Kussmaul, Wendy [name] as Consulting Physician (General Surgery) Kyung Rudd, Wendy [name] as Consulting Physician (Radiation Oncology) Nicholas Lose, Wendy [name] as Consulting Physician (Hematology and Oncology)  DIAGNOSIS:    ICD-10-CM   1. Malignant neoplasm of upper-outer quadrant of right breast in female, estrogen receptor positive ([place])  C50.411    Z17.0     SUMMARY OF ONCOLOGIC HISTORY: Oncology History  Malignant neoplasm of upper-outer quadrant of right breast in female, estrogen receptor positive ([place])  05/20/2019 Initial Diagnosis   Palpable right breast mass superficial, ultrasound 11 o'clock position 1.7 cm: Biopsy revealed grade 3 IDC with DCIS ER 100%, PR 30%, Ki-67 20%, HER-2 by IHC negative, axilla lymph node biopsy positive   06/01/2019 Cancer Staging   Staging form: Breast, AJCC 8th Edition - Clinical stage from 06/01/2019: Stage IIA (cT1c, cN1, cM0, G3, ER+, PR+, HER2-)   06/27/2019 Surgery   Right lumpectomy [name]) [phone]): IDC, grade 3, at least 2.5cm, involving a complex sclerosing lesion, 1.1cm, with high grade DCIS, involved margins, 1/3 right axillary lymph nodes positive, 1.4cm.    07/11/2019 Cancer Staging   Staging form: Breast, AJCC 8th Edition - Pathologic stage from 07/11/2019: Stage IIA (pT2, pN1a, cM0, G3, ER+, PR+, HER2-)   07/19/2019 Oncotype testing   Mammaprint: high risk, 29% chance of recurrence in 10 years without systemic treatment.    09/08/2019 Surgery   Re-excision x2 (08/01/19 and 08/12/19) [phone] and [id number])   Right mastectomy [name]) [phone]): residual carcinoma and DCIS, 1/5 lymph nodes positive, clear margins.    10/14/2019 - 12/15/2019 Chemotherapy   dexamethasone (DECADRON) 4 MG tablet, 4 mg (100 % of original dose 4 mg), Oral, Daily, 1 of 1 cycle, Start date: 07/22/2019, End date: 11/24/2019. Dose modification: 4 mg (original dose 4 mg, Cycle  0).  palonosetron (ALOXI) injection 0.25 mg, 0.25 mg, Intravenous,  Once, 4 of 4 cycles. Administration: 0.25 mg (10/14/2019), 0.25 mg (11/04/2019), 0.25 mg (11/24/2019), 0.25 mg (12/15/2019).  pegfilgrastim (NEULASTA ONPRO KIT) injection 6 mg, 6 mg, Subcutaneous, Once, 4 of 4 cycles. Administration: 6 mg (10/14/2019), 6 mg (11/04/2019), 6 mg (11/24/2019), 6 mg (12/15/2019).  cyclophosphamide (CYTOXAN) 1,260 mg in sodium chloride 0.9 % 250 mL chemo infusion, 600 mg/m2 = 1,260 mg, Intravenous,  Once, 4 of 4 cycles. Dose modification: 500 mg/m2 (original dose 600 mg/m2, Cycle 2, Reason: Dose not tolerated). Administration: 1,260 mg (10/14/2019), 1,040 mg (11/04/2019), 1,040 mg (11/24/2019), 1,040 mg (12/15/2019).  DOCEtaxel (TAXOTERE) 160 mg in sodium chloride 0.9 % 250 mL chemo infusion, 75 mg/m2 = 160 mg, Intravenous,  Once, 4 of 4 cycles. Dose modification: 65 mg/m2 (original dose 75 mg/m2, Cycle 2, Reason: Dose not tolerated). Administration: 160 mg (10/14/2019), 140 mg (11/04/2019), 140 mg (11/24/2019), 140 mg (12/15/2019).   01/31/2020 - 03/16/2020 Radiation Therapy   The patient initially received a dose of 50.4 Gy in 28 fractions to the chest wall and supraclavicular region. This was delivered using a 3-D conformal, 4 field technique. The patient then received a boost to the mastectomy scar. This delivered an additional 10 Gy in 5 fractions using an en face electron field. The total dose was 60.4 Gy.   03/2020 - 03/2027 Anti-estrogen oral therapy   Anastrozole     CHIEF COMPLIANT: Follow-up of right breast cancer on anastrozole  INTERVAL HISTORY: Wendy [name] is a 65 y.o. with above-mentioned history of right breast cancer who underwent a lumpectomy followed by re-excision  and then a mastectomy,adjuvant chemotherapy,radiation, and is currently on antiestrogen therapy with anastrozole. She stopped anastrozole on 07/30/20 due to bone pain, blurred vision, and neuropathy.She presents to the clinic todayfor  follow-up to discuss alternative antiestrogen therapy.   ALLERGIES:  is allergic to hydrocodone-acetaminophen, oxycodone, other, vicodin [hydrocodone-acetaminophen], and brimonidine.  MEDICATIONS:  Current Outpatient Medications  Medication Sig Dispense Refill  . dorzolamide-timolol (COSOPT) 22.3-6.8 MG/ML ophthalmic solution PLACE 1 DROP INTO BOTH EYES 2 (TWO) TIMES DAILY.    . furosemide (LASIX) 20 MG tablet TAKE 1 TABLET BY MOUTH TWICE A DAY 60 tablet 1  . gabapentin (NEURONTIN) 300 MG capsule TAKE 1 CAPSULE BY MOUTH THREE TIMES A DAY 90 capsule 1  . latanoprost (XALATAN) 0.005 % ophthalmic solution 1 drop at bedtime.     . meloxicam (MOBIC) 7.5 MG tablet Take 1 tablet (7.5 mg total) by mouth daily as needed for pain. 30 tablet 0  . triamcinolone ointment (KENALOG) 0.5 % APPLY TO AFFECTED AREA TWICE A DAY 30 g 0  . venlafaxine (EFFEXOR) 37.5 MG tablet Take 1 tablet (37.5 mg total) by mouth daily. 90 tablet 1   No current facility-administered medications for this visit.   Facility-Administered Medications Ordered in Other Visits  Medication Dose Route Frequency Provider Last Rate Last Admin  . heparin lock flush 100 unit/mL  500 Units Intravenous Once Nicholas Lose, Wendy [name]      . sodium chloride flush (NS) 0.9 % injection 10 mL  10 mL Intravenous PRN Nicholas Lose, Wendy [name]   10 mL at 10/14/19 1032    PHYSICAL EXAMINATION: ECOG PERFORMANCE STATUS: 1 - Symptomatic but completely ambulatory  There were no vitals filed for this visit. There were no vitals filed for this visit.  LABORATORY DATA:  I have reviewed the data as listed CMP Latest Ref Rng & Units 03/27/2020 01/18/2020 12/15/2019  Glucose 70 - 99 mg/dL 90 95 121(H)  BUN 8 - 23 mg/dL 10 10 7(L)  Creatinine 0.44 - 1.00 mg/dL 0.67 0.69 0.65  Sodium 135 - 145 mmol/L 139 144 140  Potassium 3.5 - 5.1 mmol/L 4.2 4.0 3.7  Chloride 98 - 111 mmol/L 103 111 111  CO2 22 - 32 mmol/L _0 Calcium 8.9 - 10.3 mg/dL 9.4 8.8(L) 8.6(L)  Total  Protein 6.5 - 8.1 g/dL 7.4 6.1(L) 5.8(L)  Total Bilirubin 0.3 - 1.2 mg/dL 0.5 0.7 0.5  Alkaline Phos 38 - 126 U/L 87 56 58  AST 15 - 41 U/L _1 ALT 0 - 44 U/L _2 Lab Results  Component Value Date   WBC 4.3 03/27/2020   HGB 14.2 03/27/2020   HCT 44.8 03/27/2020   MCV 87.2 03/27/2020   PLT 120 (L) 03/27/2020   NEUTROABS 2.1 03/27/2020    ASSESSMENT & PLAN:  Malignant neoplasm of upper-outer quadrant of right breast in female, estrogen receptor positive ([place]) 06/27/2019:Right lumpectomy [name]): IDC, grade 3, at least 2.5cm, involving a complex sclerosing lesion, 1.1cm, with high grade DCIS, involved margins, 1/3 right axillary lymph nodes positive, 1.4cm.ER 100%, PR 30%, Ki-67 20%, HER-2 IHC negative T2 N1 stage IIA MammaPrint: High risk, 10-year risk of recurrence untreated: 29% 09/08/2019:Re-excision x2 (08/01/19 and 08/12/19) followed by right mastectomy [name]): residual carcinoma and DCIS, 1/5 lymph nodes positive, clear margins.  Treatment plan: 1.Adjuvant chemotherapy withTaxotere and Cytoxan every 3 weeks x4completed 12/15/2019 2.Adjuvant radiation therapycompleted 03/16/2020 3.Follow-up adjuvant antiestrogen therapywith anastrozole 1 mg daily x7 years Genetic testing ------------------------------------------------------------------------------------------------------ Treatment plan:  Plan for adjuvant antiestrogen therapy with anastrozole x7 years started September 2021  Bilateral lower extremity swelling: 01/26/2020: Echocardiogram EF 60 to 65% 01/21/2020: Ultrasound lower extremity: No DVT Continue with Lasix as needed  Chemo-induced peripheral neuropathy: Encouraged her to take gabapentin at bedtime Anastrozole Toxicities: Hot flashes are improved with Effexor Slight blurred vision: Unclear etiology, she discontinued anastrozole but the symptoms have not fully resolved.  I instructed her to see ophthalmology first.  If that exam is fine she will  try to restart anastrozole again.  Severe pain in the reconstructed right breast: I suspect it is the radiation which has caused significant contracture.  She has appointment to see Dr. Marla Roe.  I recommended that we add gabapentin to the morning as well.  Breast Cancer Surveillance: 1. Breast Exam: 09/20/20: Benign 2. Mammogram: Dr. Kennith Maes office on the left breast: Benign  RTC in 1 year     No orders of the defined types were placed in this encounter.  The patient has a good understanding of the overall plan. she agrees with it. she will call with any problems that may develop before the next visit here.  Total time spent: 20 mins including face to face time and time spent for planning, charting and coordination of care  Wendy Eisenmenger, Wendy [name], Wendy [name] 09/20/2020  I, Molly Dorshimer, am acting as scribe for Dr. Nicholas Lose.  I have reviewed the above documentation for accuracy and completeness, and I agree with the above.

## 2020-09-21 ENCOUNTER — Encounter: Payer: Self-pay | Admitting: Surgical

## 2020-09-21 ENCOUNTER — Ambulatory Visit (INDEPENDENT_AMBULATORY_CARE_PROVIDER_SITE_OTHER): Payer: No Typology Code available for payment source | Admitting: Surgical

## 2020-09-21 VITALS — BP 140/75 | HR 79

## 2020-09-21 DIAGNOSIS — Z9011 Acquired absence of right breast and nipple: Secondary | ICD-10-CM

## 2020-09-21 DIAGNOSIS — Z9889 Other specified postprocedural states: Secondary | ICD-10-CM | POA: Diagnosis not present

## 2020-09-21 DIAGNOSIS — C50411 Malignant neoplasm of upper-outer quadrant of right female breast: Secondary | ICD-10-CM | POA: Diagnosis not present

## 2020-09-21 DIAGNOSIS — Z170 Estrogen receptor positive status [ER+]: Secondary | ICD-10-CM | POA: Diagnosis not present

## 2020-09-21 MED ORDER — TRAMADOL HCL 50 MG PO TABS
50.0000 mg | ORAL_TABLET | Freq: Four times a day (QID) | ORAL | 0 refills | Status: AC | PRN
Start: 2020-09-21 — End: 2020-09-26

## 2020-09-21 NOTE — Progress Notes (Signed)
   Referring Provider [name], MD [address] [place],  [place] [address]   CC:  Chief Complaint  Patient presents with  . Follow-up      Wendy [name] is an 65 y.o. female.  HPI: Patient is a 65 year old female here for evaluation after breast reconstruction.  She most recently underwent removal of right breast tissue expander and placement of a right breast implant and a left breast mastopexy/reduction for symmetry.  She had a Mentor smooth round ultra high profile gel 700 cc implant placed in her right breast.  She does have a history of radiation and chemotherapy.  She completed chemotherapy on 12/15/2019.  She completed radiation therapy on 03/16/2020.   Patient presents today with her husband.  She reports that she is having a lot of pain in her right breast, mostly pressure and tightness.  She reports that she has some limited function of her right arm/shoulder due to the tightness.  She has been trying some range of motion at home, but reports she is still having a lot of pain.  She has difficulty sleeping at night due to the pain.  She takes gabapentin for nerve pain.  She wears a compression sleeve for lymphedema.  Review of Systems Positive skin changes of right breast, no wounds  Physical Exam Vitals with BMI 09/21/2020 09/20/2020 09/10/2020  Height - 5\' 3"  5\' 3"   Weight - 209 lbs 205 lbs  BMI - 50.38 88.28  Systolic 003 491 791  Diastolic 75 62 81  Pulse 79 74 74    General:  No acute distress,  Alert and oriented, Non-Toxic, Normal speech and affect Right breast: Right breast with firmness and radiation skin changes noted.  No capsular contracture noted on exam.  Implant does feel firm given the thickness of the radiated tissue.  There is no wounds noted.  No fluid collections or fluid wave noted with palpation.  She has some tightness of her right shoulder range of motion, but overall has fairly good range of motion.  Assessment/Plan  Dr. Marla Roe was also  present during today's evaluation.  Discussed with Mrs. Hasler that postradiation changes can cause a lot of tightness of the mastectomy flaps, this tightness can sometimes improve with time, but this would be unlikely.  We also discussed that over time it can sometimes become worse.  With time some of the skin discoloration can improve, but this is different for everyone.   Recommend physical therapy to assist with improving range of motion and strengthening.  They may also be able to assist with breaking up any scar tissue.  We discussed starting with physical therapy to see if this will improve her discomfort.  If she does not see much improvement, surgical intervention options are possible which include removing the implant, exchanging for smaller implant or a latissimus muscle flap.  I do not see any sign of infection, seroma, hematoma.  We will try some tramadol for pain at night.  Recommend patient follow-up in our office after completion of physical therapy.  Recommend he call with any questions or concerns prior to completion of therapy.  Carola Rhine Kathleene Bergemann 09/21/2020, 10:32 AM

## 2020-10-05 ENCOUNTER — Other Ambulatory Visit: Payer: Self-pay

## 2020-10-05 ENCOUNTER — Ambulatory Visit: Payer: No Typology Code available for payment source | Attending: Surgical | Admitting: Physical Therapy

## 2020-10-05 DIAGNOSIS — I890 Lymphedema, not elsewhere classified: Secondary | ICD-10-CM | POA: Insufficient documentation

## 2020-10-05 DIAGNOSIS — L599 Disorder of the skin and subcutaneous tissue related to radiation, unspecified: Secondary | ICD-10-CM | POA: Diagnosis not present

## 2020-10-05 DIAGNOSIS — G8929 Other chronic pain: Secondary | ICD-10-CM | POA: Insufficient documentation

## 2020-10-05 DIAGNOSIS — Z483 Aftercare following surgery for neoplasm: Secondary | ICD-10-CM | POA: Diagnosis present

## 2020-10-05 DIAGNOSIS — M25611 Stiffness of right shoulder, not elsewhere classified: Secondary | ICD-10-CM

## 2020-10-05 DIAGNOSIS — M25511 Pain in right shoulder: Secondary | ICD-10-CM | POA: Insufficient documentation

## 2020-10-05 NOTE — Therapy (Signed)
[place], [place], [address] Phone: [phone]   Fax:  [phone]  Physical Therapy Evaluation  Patient Details  Name: Wendy [name] MRN: [id number] Date of Birth: [birth year] Referring Provider (PT): Dr Marla Roe   Encounter Date: 10/05/2020   PT End of Session - 10/05/20 1224    Visit Number 1    Number of Visits 17    Date for PT Re-Evaluation 12/05/20    PT Start Time 1000    PT Stop Time 1045    PT Time Calculation (min) 45 min    Activity Tolerance Patient tolerated treatment well    Behavior During Therapy [facility] for tasks assessed/performed           Past Medical History:  Diagnosis Date  . Breast cancer ([place])   . Colon cancer ([place])   . DDD (degenerative disc disease), cervical   . DDD (degenerative disc disease), lumbar   . ESOPHAGITIS, REFLUX   . Glaucoma   . HELICOBACTER PYLORI INFECTION   . HEMORRHOIDS, INTERNAL   . History of colon cancer   . History of colon polyps 2019  . Hyperlipidemia   . Radius fracture 2006   Left, Distal  . Thoracic spondylosis   . Tinnitus, bilateral 2013    Past Surgical History:  Procedure Laterality Date  . BREAST LUMPECTOMY WITH RADIOACTIVE SEED AND SENTINEL LYMPH NODE BIOPSY Right 06/27/2019   Procedure: RIGHT BREAST LUMPECTOMY WITH RADIOACTIVE SEED AND SENTINEL LYMPH NODE BIOPSY AND RIGHT TARGETED NODE DISSECTION;  Surgeon: Jovita Kussmaul, MD;  Location: [place];  Service: General;  Laterality: Right;  . BREAST RECONSTRUCTION WITH PLACEMENT OF TISSUE EXPANDER AND FLEX HD (ACELLULAR HYDRATED DERMIS) Right 09/08/2019   Procedure: BREAST RECONSTRUCTION WITH PLACEMENT OF TISSUE EXPANDER AND FLEX HD (ACELLULAR HYDRATED DERMIS);  Surgeon: Wallace Going, DO;  Location: [place];  Service: Plastics;  Laterality: Right;  . BREAST REDUCTION WITH MASTOPEXY Left 01/02/2020   Procedure: MASTOPEXY / REDUCTION FOR  SYMMETRY;  Surgeon: Wallace Going, DO;  Location: [place];  Service: Plastics;  Laterality: Left;  . CESAREAN SECTION  1989  . COLONOSCOPY  2019  . HYSTEROSCOPY WITH D & C N/A 09/17/2018   Procedure: DILATATION AND CURETTAGE Marlene Bast;  Surgeon: Brien Few, MD;  Location: [facility];  Service: Gynecology;  Laterality: N/A;  . PORT-A-CATH REMOVAL Left 01/02/2020   Procedure: REMOVAL PORT-A-CATH;  Surgeon: Wallace Going, DO;  Location: [place] [facility];  Service: Plastics;  Laterality: Left;  . PORTACATH PLACEMENT Left 08/01/2019   Procedure: INSERTION PORT-A-CATH;  Surgeon: Jovita Kussmaul, MD;  Location: [place];  Service: General;  Laterality: Left;  . RE-EXCISION OF BREAST CANCER,SUPERIOR MARGINS Right 08/12/2019   Procedure: RE-EXCISION OF RIGHT BREAST,SUPERIOR MARGINS;  Surgeon: Jovita Kussmaul, MD;  Location: [facility];  Service: General;  Laterality: Right;  . RE-EXCISION OF BREAST LUMPECTOMY Right 08/01/2019   Procedure: RE-EXCISION OF RIGHT BREAST CANCER  MARGINS;  Surgeon: Jovita Kussmaul, MD;  Location: [place];  Service: General;  Laterality: Right;  . REMOVAL OF TISSUE EXPANDER AND PLACEMENT OF IMPLANT Right 01/02/2020   Procedure: REMOVAL OF TISSUE EXPANDER AND PLACEMENT OF IMPLANT;  Surgeon: Wallace Going, DO;  Location: [place];  Service: Plastics;  Laterality: Right;  . SIMPLE MASTECTOMY WITH AXILLARY SENTINEL NODE BIOPSY Right 09/08/2019   Procedure: RIGHT BREAST MASTECTOMY;  Surgeon: Jovita Kussmaul, MD;  Location: [facility];  Service: General;  Laterality: Right;  . TONSILLECTOMY     age 65 years old  . WRIST FRACTURE SURGERY  2006    There were no vitals filed for this visit.    Subjective Assessment - 10/05/20 1007    Subjective pt is here today because she continues to have chest tightness mostly at night.  She still  occasionally has problems with lymphedema and never received her sleeve from [facility]( Lenna Sciara is here today and will follow up) She still has tingling in fingers and toes.  She has returned to work full time and sits a desk with computer 8 hours a day    Pertinent History Rt lumpectomy grade 3 DCIS on 06/27/19 with Dr. [name] and Re-excision on 08/12/19, then right mastectomy with immediate expander placement and 8 lymph nodes removed Dr. Marla Roe for Plastics.  Recieved  chemotherapy of Cytoxan and Taxotere and had radiation.  Right implant was in June 2021    Patient Stated Goals to get relief from pain    Currently in Pain? Yes    Pain Score 2     Pain Location Arm    Pain Orientation Right    Pain Descriptors / Indicators Aching    Pain Type Chronic pain    Pain Radiating Towards sometimes down her arm    Pain Onset More than a month ago    Pain Frequency Intermittent    Aggravating Factors  gets worse toward the end of the day    Pain Relieving Factors takes a pain med    Effect of Pain on Daily Activities interferes with sleep, sometimes restrictive reaching back              [facility] PT Assessment - 10/05/20 0001      Assessment   Medical Diagnosis Rt breast cancer    Referring Provider (PT) Dr Marla Roe    Onset Date/Surgical Date 06/27/19    Hand Dominance Right    Prior Therapy no      Precautions   Precaution Comments lymphedema      Balance Screen   Has the patient fallen in the past 6 months No    Has the patient had a decrease in activity level because of a fear of falling?  No    Is the patient reluctant to leave their home because of a fear of falling?  No      Home Ecologist residence    Living Arrangements Spouse/significant other    Type of [place]      Prior Function   Level of [place] Full time employment    Chief Technology Officer    Leisure wants to get back to walking and exercise if she  can find the time      Cognition   Overall Cognitive Status Within Functional Limits for tasks assessed      Observation/Other Assessments   Observations well healed incisions at implant on right breast Skin darkened and appears tight with visible pulling of skin as she tries to move. Right shoudler movements are slow and she grimaces with pain and hikes her shoulder as she raises her arm Body habitus type of large arms above elbows on both sides    Skin Integrity no open areas    Other Surveys  Quick Dash    Quick DASH  31.82  Sensation   Additional Comments moderate numbness in a      Coordination   Gross Motor Movements are Fluid and Coordinated No   slow painful elevation of right arm     Posture/Postural Control   Posture/Postural Control Postural limitations    Postural Limitations Rounded Shoulders;Forward head;Decreased thoracic kyphosis    Posture Comments tightess in upper back      AROM   Overall AROM  Deficits    Overall AROM Comments limited neck and right shoulder ROM  Pt reports she is normally very felxible and "bendy"    Right Shoulder Flexion 120 Degrees   tender tightness in posterior shoudler and lateral scapular   Right Shoulder ABduction 115 Degrees   pain in anterior chest   Right Shoulder Internal Rotation --    Right Shoulder External Rotation 75 Degrees    Right Shoulder Horizontal ABduction --    Left Shoulder Flexion 176 Degrees    Left Shoulder ABduction 165 Degrees    Left Shoulder Internal Rotation --    Left Shoulder External Rotation 110 Degrees    Left Shoulder Horizontal ABduction --      PROM   Overall PROM Comments limited by pain in right upper quadrant      Palpation   Palpation comment extremely tender and tight in right neck, posterior shoulder and lateral trunk, axilla, anterior chest and around implant             LYMPHEDEMA/ONCOLOGY QUESTIONNAIRE - 10/05/20 0001      Type   Cancer Type Rt breast      Treatment    Active Chemotherapy Treatment No    Past Chemotherapy Treatment Yes    Date 10/14/19    Active Radiation Treatment No    Past Radiation Treatment Yes    Date 03/15/20    Body Site right upper quadrant    Current Hormone Treatment No    Past Hormone Therapy No      What other symptoms do you have   Are you Having Heaviness or Tightness Yes    Are you having Pain Yes    Are you having pitting edema No    Is it Hard or Difficult finding clothes that fit Yes    Do you have infections No    Is there Decreased scar mobility Yes    [place] Sign No      Lymphedema Assessments   Lymphedema Assessments Upper extremities      Right Upper Extremity Lymphedema   15 cm Proximal to Olecranon Process 40 cm    10 cm Proximal to Olecranon Process 42.5 cm    Olecranon Process 31.2 cm    15 cm Proximal to Ulnar Styloid Process 30.5 cm    10 cm Proximal to Ulnar Styloid Process 25.5 cm    Just Proximal to Ulnar Styloid Process 17.8 cm    Across Hand at [employer] 19 cm    At [place] of 2nd Digit 6.3 cm      Left Upper Extremity Lymphedema   15 cm Proximal to Olecranon Process 41 cm    10 cm Proximal to Olecranon Process 41 cm    Olecranon Process 31 cm    15 cm Proximal to Ulnar Styloid Process 30 cm    10 cm Proximal to Ulnar Styloid Process 25.5 cm    Just Proximal to Ulnar Styloid Process 17.4 cm    Across Hand at [employer] 18.7 cm  At [facility] of 2nd Digit 5.9 cm                 Quick Dash - 10/05/20 0001    Open a tight or new jar Moderate difficulty    Do heavy household chores (wash walls, wash floors) Moderate difficulty    Carry a shopping bag or briefcase No difficulty    Wash your back Severe difficulty    Use a knife to cut food No difficulty    Recreational activities in which you take some force or impact through your arm, shoulder, or hand (golf, hammering, tennis) Moderate difficulty    During the past week, to what extent has your arm, shoulder or hand  problem interfered with your normal social activities with family, friends, neighbors, or groups? Slightly    During the past week, to what extent has your arm, shoulder or hand problem limited your work or other regular daily activities Slightly    Arm, shoulder, or hand pain. Moderate    Difficulty Sleeping Moderate difficulty    DASH Score 31.82 %          Flowsheet Row Outpatient Rehab from 10/21/2019 in [place]  Lymphedema Life Impact Scale Total Score 7.35 %      Objective measurements completed on examination: See above findings.                 PT Short Term Goals - 10/05/20 1223      PT SHORT TERM GOAL #4   Title Pt will decrease Quick DASH score to < 20 indicating an improvement in right UE function    Baseline 31.82 on 10/05/2020    Time 8    Period Weeks    Status New             PT Long Term Goals - 10/05/20 1207      PT LONG TERM GOAL #1   Title Pt will increase Rt shoulder AROM to at least: 150 degrees of right shoulder flexion and abuction to increase mobility    Baseline 10/05/2020 120 flex, 115 abd    Time 8    Period Weeks    Status New      PT LONG TERM GOAL #2   Title Pt will report the pain in her right upper quadrant is decreased by 50%    Time 8    Period Weeks    Status New      PT LONG TERM GOAL #3   Title Pt will be independent in HEP for ROM, strength and fitness that she reports she can continue at home    Time 8    Status New      PT LONG TERM GOAL #4   Title Pt will decrease Quick DASH score to < 20 indicating an improvement in function of RUE    Baseline 31.82 on 10/05/2020                  Plan - 10/05/20 1212    Clinical Impression Statement Pt comes back to PT after right implant surgery and radiation completed 03/2020 with significant soft tissue tightness and pain in anterior chest, axilla and lateal chest and posterior shoudler, neck and back.  She had limited active ROM  , pain and general strength, though she says she does not have much time for exercise.  The [employer] fitter happened to be here today and discovered the problem that she had not received her  compression sleeve, so that should arrive next week.  Will begin with soft tissue work, stretching and exercise, but will include dry needling in the plan of care should pt needs that if she does not make significant progress otherwise.  Pt reports she may consider more surgery if this therapy is not effective    Personal Factors and Comorbidities Comorbidity 3+    Comorbidities lymph node removal, chemotherapy, radiation.    Examination-Activity Limitations Reach Overhead;Caring for Others;Carry;Lift;Hygiene/Grooming;Sleep    Examination-Participation Restrictions Yard Work;Cleaning;Laundry;Meal Prep;Shop    Stability/Clinical Decision Making Evolving/Moderate complexity    Clinical Decision Making Moderate    Rehab Potential Good    PT Frequency 2x / week    PT Duration 8 weeks    PT Treatment/Interventions ADLs/Self Care Home Management;DME Instruction;Therapeutic exercise;Patient/family education;Manual techniques;Manual lymph drainage;Compression bandaging;Passive range of motion;Therapeutic activities;Neuromuscular re-education;Orthotic Fit/Training;Scar mobilization;Dry needling;Taping    PT Next Visit Plan Soft tissue work, myofascial release, stretching, active ROM for shoulder, neck and thoracic spine.  check Juzo sleeve for fit and let Melissa know    Consulted and Agree with Plan of Care Patient           Patient will benefit from skilled therapeutic intervention in order to improve the following deficits and impairments:  Pain,Decreased scar mobility,Postural dysfunction,Decreased range of motion,Increased edema,Decreased skin integrity,Increased muscle spasms,Decreased activity tolerance,Decreased strength,Increased fascial restricitons,Impaired flexibility,Impaired UE functional use  Visit  Diagnosis: Disorder of the skin and subcutaneous tissue related to radiation, unspecified - Plan: PT plan of care cert/re-cert  Aftercare following surgery for neoplasm - Plan: PT plan of care cert/re-cert  Stiffness of right shoulder, not elsewhere classified - Plan: PT plan of care cert/re-cert  Chronic right shoulder pain - Plan: PT plan of care cert/re-cert     Problem List Patient Active Problem List   Diagnosis Date Noted  . S/P breast reconstruction, right 01/23/2020  . History of reduction surgery of left breast 01/23/2020  . Port-A-Cath in place 10/20/2019  . S/P mastectomy, right 10/19/2019  . Acquired absence of breast 09/16/2019  . Breast cancer ([place]) 09/08/2019  . Genetic testing 06/10/2019  . Malignant neoplasm of upper-outer quadrant of right breast in female, estrogen receptor positive ([place]) 06/01/2019  . Family history of breast cancer 06/01/2019  . Family history of colon cancer 06/01/2019  . Family history of brain cancer 06/01/2019  . History of colon cancer   . Abnormal colonoscopy 10/30/2017  . Chronic glaucoma 08/11/2013  . HELICOBACTER PYLORI INFECTION 08/27/2007  . GERD 08/27/2007  . ESOPHAGITIS, REFLUX 05/17/2007  . HEMORRHOIDS, INTERNAL 09/22/2003   Donato Heinz. Owens Shark PT  [name] 10/05/2020, 12:27 PM  [place], [place], [address] Phone: [phone]   Fax:  [phone]  Name: Wendy [name] MRN: [id number] Date of Birth: [birth year]

## 2020-10-12 ENCOUNTER — Ambulatory Visit: Payer: No Typology Code available for payment source

## 2020-10-18 ENCOUNTER — Other Ambulatory Visit: Payer: Self-pay

## 2020-10-18 ENCOUNTER — Encounter: Payer: Self-pay | Admitting: Rehabilitation

## 2020-10-18 ENCOUNTER — Ambulatory Visit: Payer: No Typology Code available for payment source | Admitting: Rehabilitation

## 2020-10-18 DIAGNOSIS — Z483 Aftercare following surgery for neoplasm: Secondary | ICD-10-CM

## 2020-10-18 DIAGNOSIS — M25611 Stiffness of right shoulder, not elsewhere classified: Secondary | ICD-10-CM

## 2020-10-18 DIAGNOSIS — I890 Lymphedema, not elsewhere classified: Secondary | ICD-10-CM

## 2020-10-18 DIAGNOSIS — L599 Disorder of the skin and subcutaneous tissue related to radiation, unspecified: Secondary | ICD-10-CM

## 2020-10-18 DIAGNOSIS — G8929 Other chronic pain: Secondary | ICD-10-CM

## 2020-10-20 ENCOUNTER — Other Ambulatory Visit: Payer: Self-pay | Admitting: Hematology and Oncology

## 2020-10-22 ENCOUNTER — Other Ambulatory Visit: Payer: Self-pay | Admitting: Adult Health

## 2020-10-22 ENCOUNTER — Ambulatory Visit: Payer: No Typology Code available for payment source

## 2020-10-24 ENCOUNTER — Other Ambulatory Visit: Payer: Self-pay

## 2020-10-24 ENCOUNTER — Ambulatory Visit: Payer: No Typology Code available for payment source | Admitting: Physical Therapy

## 2020-10-24 ENCOUNTER — Encounter: Payer: Self-pay | Admitting: Physical Therapy

## 2020-10-24 DIAGNOSIS — L599 Disorder of the skin and subcutaneous tissue related to radiation, unspecified: Secondary | ICD-10-CM | POA: Diagnosis not present

## 2020-10-24 DIAGNOSIS — M25611 Stiffness of right shoulder, not elsewhere classified: Secondary | ICD-10-CM

## 2020-10-24 DIAGNOSIS — Z483 Aftercare following surgery for neoplasm: Secondary | ICD-10-CM

## 2020-10-24 DIAGNOSIS — I890 Lymphedema, not elsewhere classified: Secondary | ICD-10-CM

## 2020-10-24 NOTE — Therapy (Signed)
[place], [place], [address] Phone: [phone]   Fax:  [phone]  Physical Therapy Treatment  Patient Details  Name: Wendy [name] MRN: [id number] Date of Birth: Aug 30, 1955 Referring Provider (PT): Dr Marla Roe   Encounter Date: 10/18/2020   PT End of Session - 10/24/20 1302    Visit Number 2    Number of Visits 17    Date for PT Re-Evaluation 12/05/20    PT Start Time 1600    PT Stop Time 1655    PT Time Calculation (min) 55 min    Activity Tolerance Patient tolerated treatment well    Behavior During Therapy [facility] for tasks assessed/performed           Past Medical History:  Diagnosis Date  . Breast cancer ([place])   . Colon cancer ([place])   . DDD (degenerative disc disease), cervical   . DDD (degenerative disc disease), lumbar   . ESOPHAGITIS, REFLUX   . Glaucoma   . HELICOBACTER PYLORI INFECTION   . HEMORRHOIDS, INTERNAL   . History of colon cancer   . History of colon polyps 2019  . Hyperlipidemia   . Radius fracture 2006   Left, Distal  . Thoracic spondylosis   . Tinnitus, bilateral 2013    Past Surgical History:  Procedure Laterality Date  . BREAST LUMPECTOMY WITH RADIOACTIVE SEED AND SENTINEL LYMPH NODE BIOPSY Right 06/27/2019   Procedure: RIGHT BREAST LUMPECTOMY WITH RADIOACTIVE SEED AND SENTINEL LYMPH NODE BIOPSY AND RIGHT TARGETED NODE DISSECTION;  Surgeon: Jovita Kussmaul, MD;  Location: [facility];  Service: General;  Laterality: Right;  . BREAST RECONSTRUCTION WITH PLACEMENT OF TISSUE EXPANDER AND FLEX HD (ACELLULAR HYDRATED DERMIS) Right 09/08/2019   Procedure: BREAST RECONSTRUCTION WITH PLACEMENT OF TISSUE EXPANDER AND FLEX HD (ACELLULAR HYDRATED DERMIS);  Surgeon: Wallace Going, DO;  Location: [facility];  Service: Plastics;  Laterality: Right;  . BREAST REDUCTION WITH MASTOPEXY Left 01/02/2020   Procedure: MASTOPEXY / REDUCTION FOR  SYMMETRY;  Surgeon: Wallace Going, DO;  Location: [place];  Service: Plastics;  Laterality: Left;  . CESAREAN SECTION  1989  . COLONOSCOPY  2019  . HYSTEROSCOPY WITH D & C N/A 09/17/2018   Procedure: DILATATION AND CURETTAGE Marlene Bast;  Surgeon: Brien Few, MD;  Location: [facility];  Service: Gynecology;  Laterality: N/A;  . PORT-A-CATH REMOVAL Left 01/02/2020   Procedure: REMOVAL PORT-A-CATH;  Surgeon: Wallace Going, DO;  Location: [facility];  Service: Plastics;  Laterality: Left;  . PORTACATH PLACEMENT Left 08/01/2019   Procedure: INSERTION PORT-A-CATH;  Surgeon: Jovita Kussmaul, MD;  Location: [facility];  Service: General;  Laterality: Left;  . RE-EXCISION OF BREAST CANCER,SUPERIOR MARGINS Right 08/12/2019   Procedure: RE-EXCISION OF RIGHT BREAST,SUPERIOR MARGINS;  Surgeon: Jovita Kussmaul, MD;  Location: [facility];  Service: General;  Laterality: Right;  . RE-EXCISION OF BREAST LUMPECTOMY Right 08/01/2019   Procedure: RE-EXCISION OF RIGHT BREAST CANCER  MARGINS;  Surgeon: Jovita Kussmaul, MD;  Location: [place];  Service: General;  Laterality: Right;  . REMOVAL OF TISSUE EXPANDER AND PLACEMENT OF IMPLANT Right 01/02/2020   Procedure: REMOVAL OF TISSUE EXPANDER AND PLACEMENT OF IMPLANT;  Surgeon: Wallace Going, DO;  Location: [place];  Service: Plastics;  Laterality: Right;  . SIMPLE MASTECTOMY WITH AXILLARY SENTINEL NODE BIOPSY Right 09/08/2019   Procedure: RIGHT BREAST MASTECTOMY;  Surgeon: Jovita Kussmaul, MD;  Location: [place];  Service: General;  Laterality: Right;  . TONSILLECTOMY     age 65 years old  . WRIST FRACTURE SURGERY  2006    There were no vitals filed for this visit.              Flowsheet Row Outpatient Rehab from 10/21/2019 in [place]  Lymphedema Life Impact  Scale Total Score 7.35 %                      PT Short Term Goals - 10/05/20 1223      PT SHORT TERM GOAL #4   Title Pt will decrease Quick DASH score to < 20 indicating an improvement in right UE function    Baseline 31.82 on 10/05/2020    Time 8    Period Weeks    Status New             PT Long Term Goals - 10/05/20 1207      PT LONG TERM GOAL #1   Title Pt will increase Rt shoulder AROM to at least: 150 degrees of right shoulder flexion and abuction to increase mobility    Baseline 10/05/2020 120 flex, 115 abd    Time 8    Period Weeks    Status New      PT LONG TERM GOAL #2   Title Pt will report the pain in her right upper quadrant is decreased by 50%    Time 8    Period Weeks    Status New      PT LONG TERM GOAL #3   Title Pt will be independent in HEP for ROM, strength and fitness that she reports she can continue at home    Time 8    Status New      PT LONG TERM GOAL #4   Title Pt will decrease Quick DASH score to < 20 indicating an improvement in function of RUE    Baseline 31.82 on 10/05/2020                  Patient will benefit from skilled therapeutic intervention in order to improve the following deficits and impairments:     Visit Diagnosis: Disorder of the skin and subcutaneous tissue related to radiation, unspecified  Aftercare following surgery for neoplasm  Stiffness of right shoulder, not elsewhere classified  Chronic right shoulder pain  Lymphedema     Problem List Patient Active Problem List   Diagnosis Date Noted  . S/P breast reconstruction, right 01/23/2020  . History of reduction surgery of left breast 01/23/2020  . Port-A-Cath in place 10/20/2019  . S/P mastectomy, right 10/19/2019  . Acquired absence of breast 09/16/2019  . Breast cancer ([place]) 09/08/2019  . Genetic testing 06/10/2019  . Malignant neoplasm of upper-outer quadrant of right breast in female, estrogen receptor positive ([place]) 06/01/2019  .  Family history of breast cancer 06/01/2019  . Family history of colon cancer 06/01/2019  . Family history of brain cancer 06/01/2019  . History of colon cancer   . Abnormal colonoscopy 10/30/2017  . Chronic glaucoma 08/11/2013  . HELICOBACTER PYLORI INFECTION 08/27/2007  . GERD 08/27/2007  . ESOPHAGITIS, REFLUX 05/17/2007  . HEMORRHOIDS, INTERNAL 09/22/2003    Treatment done by Shan Levans, PT on 10/18/2020 [name] 10/24/2020, 1:03 PM  [name], [place], [address] Phone: [phone]  Fax:  [phone]  Name: Wendy [name] MRN: [id number] Date of Birth: [birth year]

## 2020-10-24 NOTE — Therapy (Signed)
[place], [place], [address] Phone: [phone]   Fax:  [phone]  Physical Therapy Treatment  Patient Details  Name: Wendy [name] MRN: [id number] Date of Birth: 1955/08/18 Referring Provider (PT): Dr Marla Roe   Encounter Date: 10/24/2020   PT End of Session - 10/24/20 1614    Visit Number 3    Number of Visits 17    Date for PT Re-Evaluation 12/05/20    PT Start Time 1500    PT Stop Time 1555    PT Time Calculation (min) 55 min    Activity Tolerance Patient tolerated treatment well    Behavior During Therapy [facility] for tasks assessed/performed           Past Medical History:  Diagnosis Date  . Breast cancer ([place])   . Colon cancer ([place])   . DDD (degenerative disc disease), cervical   . DDD (degenerative disc disease), lumbar   . ESOPHAGITIS, REFLUX   . Glaucoma   . HELICOBACTER PYLORI INFECTION   . HEMORRHOIDS, INTERNAL   . History of colon cancer   . History of colon polyps 2019  . Hyperlipidemia   . Radius fracture 2006   Left, Distal  . Thoracic spondylosis   . Tinnitus, bilateral 2013    Past Surgical History:  Procedure Laterality Date  . BREAST LUMPECTOMY WITH RADIOACTIVE SEED AND SENTINEL LYMPH NODE BIOPSY Right 06/27/2019   Procedure: RIGHT BREAST LUMPECTOMY WITH RADIOACTIVE SEED AND SENTINEL LYMPH NODE BIOPSY AND RIGHT TARGETED NODE DISSECTION;  Surgeon: Jovita Kussmaul, MD;  Location: [place];  Service: General;  Laterality: Right;  . BREAST RECONSTRUCTION WITH PLACEMENT OF TISSUE EXPANDER AND FLEX HD (ACELLULAR HYDRATED DERMIS) Right 09/08/2019   Procedure: BREAST RECONSTRUCTION WITH PLACEMENT OF TISSUE EXPANDER AND FLEX HD (ACELLULAR HYDRATED DERMIS);  Surgeon: Wallace Going, DO;  Location: [place];  Service: Plastics;  Laterality: Right;  . BREAST REDUCTION WITH MASTOPEXY Left 01/02/2020   Procedure: MASTOPEXY / REDUCTION FOR  SYMMETRY;  Surgeon: Wallace Going, DO;  Location: [place];  Service: Plastics;  Laterality: Left;  . CESAREAN SECTION  1989  . COLONOSCOPY  2019  . HYSTEROSCOPY WITH D & C N/A 09/17/2018   Procedure: DILATATION AND CURETTAGE Marlene Bast;  Surgeon: Brien Few, MD;  Location: [place];  Service: Gynecology;  Laterality: N/A;  . PORT-A-CATH REMOVAL Left 01/02/2020   Procedure: REMOVAL PORT-A-CATH;  Surgeon: Wallace Going, DO;  Location: [facility];  Service: Plastics;  Laterality: Left;  . PORTACATH PLACEMENT Left 08/01/2019   Procedure: INSERTION PORT-A-CATH;  Surgeon: Jovita Kussmaul, MD;  Location: [place];  Service: General;  Laterality: Left;  . RE-EXCISION OF BREAST CANCER,SUPERIOR MARGINS Right 08/12/2019   Procedure: RE-EXCISION OF RIGHT BREAST,SUPERIOR MARGINS;  Surgeon: Jovita Kussmaul, MD;  Location: [facility];  Service: General;  Laterality: Right;  . RE-EXCISION OF BREAST LUMPECTOMY Right 08/01/2019   Procedure: RE-EXCISION OF RIGHT BREAST CANCER  MARGINS;  Surgeon: Jovita Kussmaul, MD;  Location: [place];  Service: General;  Laterality: Right;  . REMOVAL OF TISSUE EXPANDER AND PLACEMENT OF IMPLANT Right 01/02/2020   Procedure: REMOVAL OF TISSUE EXPANDER AND PLACEMENT OF IMPLANT;  Surgeon: Wallace Going, DO;  Location: [place];  Service: Plastics;  Laterality: Right;  . SIMPLE MASTECTOMY WITH AXILLARY SENTINEL NODE BIOPSY Right 09/08/2019   Procedure: RIGHT BREAST MASTECTOMY;  Surgeon: Jovita Kussmaul, MD;  Location: [facility];  Service: General;  Laterality: Right;  . TONSILLECTOMY     age 65 years old  . WRIST FRACTURE SURGERY  2006    There were no vitals filed for this visit.   Subjective Assessment - 10/24/20 1517    Subjective Doing good today. Pt got her sleeve and glove in the mail    Pertinent History Rt lumpectomy  grade 3 DCIS on 06/27/19 with Dr. [name] and Re-excision on 08/12/19, then right mastectomy with immediate expander placement and 8 lymph nodes removed Dr. Marla Roe for Plastics.  Recieved  chemotherapy of Cytoxan and Taxotere and had radiation.  Right implant was in June 2021    Patient Stated Goals to get relief from pain    Currently in Pain? Yes    Pain Score 2     Pain Location Arm    Pain Orientation Right                     Flowsheet Row Outpatient Rehab from 10/21/2019 in [place]  Lymphedema Life Impact Scale Total Score 7.35 %            [facility] Adult PT Treatment/Exercise - 10/24/20 0001      Self-Care   Self-Care Other Self-Care Comments    Other Self-Care Comments  showed pt how to don her sleeve and glove with thr Docia Furl and she was able to do it much easier, Sleeve appears to fit her well      Exercises   Exercises Shoulder      Shoulder Exercises: Supine   Protraction AAROM;Both;5 reps   with dowel   Flexion AAROM;Both;5 reps   with dowel   Other Supine Exercises small circles with right hand pointed to ceiling      Manual Therapy   Manual Therapy Soft tissue mobilization;Myofascial release;Passive ROM    Soft tissue mobilization in sidelying and with with cocoa butter to right upper traps, scapular and posterior shoulder mucles.  Attempted to do pec major, but pt still with marked tenderness to this area. Pt also with tightness in deep right neck muscles.    Myofascial Release with prolonged pressure to trigger points  also with crossed hands to skin on anterior chest and around implant    Passive ROM to right shoulder.  Pt with decreased external rotation with severe tightness at pec major                    PT Short Term Goals - 10/24/20 1618      PT SHORT TERM GOAL #1   Title Pt will have a good  fitting compression sleeve for right arm    Status Achieved      PT SHORT TERM GOAL #2   Title  She will report pain decr 25% decrease in raising her arm overhead    Time 4    Period Weeks    Status On-going      PT SHORT TERM GOAL #3   Title Pt will be independent in a basic HEP for upper body stretching    Period Weeks    Status On-going      PT SHORT TERM GOAL #4   Title Pt will decrease Quick DASH score to < 20 indicating an improvement in right UE function    Baseline 31.82 on 10/05/2020    Time 8    Status On-going  PT Long Term Goals - 10/24/20 1618      PT LONG TERM GOAL #1   Title Pt will increase Rt shoulder AROM to at least: 150 degrees of right shoulder flexion and abuction to increase mobility    Baseline 10/05/2020 120 flex, 115 abd    Period Weeks    Status On-going      PT LONG TERM GOAL #2   Title Pt will report the pain in her right upper quadrant is decreased by 50%    Time 8    Period Weeks    Status On-going      PT LONG TERM GOAL #3   Title Pt will be independent in HEP for ROM, strength and fitness that she reports she can continue at home    Time 8    Period Weeks    Status On-going      PT LONG TERM GOAL #4   Title Pt will decrease Quick DASH score to < 20 indicating an improvement in function of RUE    Baseline 31.82 on 10/05/2020    Time 8    Period Weeks    Status On-going                 Plan - 10/24/20 1615    Clinical Impression Statement Pt is showing signs of improvment with better skin excursion over implant, but she continues to have increased tightness in muscles around shoulder and scapula and especially in neck. We discussed the possibility of dry needling to these muscles in the future. Her juzo compression sleeve/glove  appear to fit well    Personal Factors and Comorbidities Comorbidity 3+    Comorbidities lymph node removal, chemotherapy, radiation.    Examination-Activity Limitations Reach Overhead;Caring for Others;Carry;Lift;Hygiene/Grooming;Sleep    Examination-Participation Restrictions Yard  Work;Cleaning;Laundry;Meal Prep;Shop    Stability/Clinical Decision Making Evolving/Moderate complexity    Rehab Potential Good    PT Frequency 2x / week    PT Duration 8 weeks    PT Treatment/Interventions ADLs/Self Care Home Management;DME Instruction;Therapeutic exercise;Patient/family education;Manual techniques;Manual lymph drainage;Compression bandaging;Passive range of motion;Therapeutic activities;Neuromuscular re-education;Orthotic Fit/Training;Scar mobilization;Dry needling;Taping    PT Next Visit Plan Soft tissue work, myofascial release, stretching, active ROM for shoulder, neck and thoracic spine.           Patient will benefit from skilled therapeutic intervention in order to improve the following deficits and impairments:  Pain,Decreased scar mobility,Postural dysfunction,Decreased range of motion,Increased edema,Decreased skin integrity,Increased muscle spasms,Decreased activity tolerance,Decreased strength,Increased fascial restricitons,Impaired flexibility,Impaired UE functional use  Visit Diagnosis: Disorder of the skin and subcutaneous tissue related to radiation, unspecified  Aftercare following surgery for neoplasm  Stiffness of right shoulder, not elsewhere classified  Lymphedema     Problem List Patient Active Problem List   Diagnosis Date Noted  . S/P breast reconstruction, right 01/23/2020  . History of reduction surgery of left breast 01/23/2020  . Port-A-Cath in place 10/20/2019  . S/P mastectomy, right 10/19/2019  . Acquired absence of breast 09/16/2019  . Breast cancer ([place]) 09/08/2019  . Genetic testing 06/10/2019  . Malignant neoplasm of upper-outer quadrant of right breast in female, estrogen receptor positive ([place]) 06/01/2019  . Family history of breast cancer 06/01/2019  . Family history of colon cancer 06/01/2019  . Family history of brain cancer 06/01/2019  . History of colon cancer   . Abnormal colonoscopy 10/30/2017  . Chronic glaucoma  08/11/2013  . HELICOBACTER PYLORI INFECTION 08/27/2007  . GERD 08/27/2007  . ESOPHAGITIS, REFLUX 05/17/2007  .  HEMORRHOIDS, INTERNAL 09/22/2003   Donato Heinz. Owens Shark PT  [name] 10/24/2020, 4:20 PM  [place], [place], [address] Phone: [phone]   Fax:  [phone]  Name: Wendy [name] MRN: [id number] Date of Birth: 07/27/1955

## 2020-10-25 ENCOUNTER — Ambulatory Visit (INDEPENDENT_AMBULATORY_CARE_PROVIDER_SITE_OTHER): Payer: No Typology Code available for payment source | Admitting: Registered Nurse

## 2020-10-25 VITALS — BP 110/78 | HR 70 | Temp 98.2°F | Resp 15 | Ht 63.0 in | Wt 206.8 lb

## 2020-10-25 DIAGNOSIS — H9193 Unspecified hearing loss, bilateral: Secondary | ICD-10-CM

## 2020-10-25 DIAGNOSIS — H9313 Tinnitus, bilateral: Secondary | ICD-10-CM

## 2020-10-29 ENCOUNTER — Encounter: Payer: Self-pay | Admitting: Registered Nurse

## 2020-10-29 NOTE — Progress Notes (Signed)
Established Patient Office Visit  Subjective:  Patient ID: Wendy [name], female    DOB: 1956-05-13  Age: 65 y.o. MRN: [id number]  CC:  Chief Complaint  Patient presents with  . Tinnitus    Pt has had long term ringing in her ears for an estimated 10 years, pt reports it feels it is getting worse, especially at night, pt notes over last few weeks has been progressively worse not allowing her to sleep at night, denies ear ache or other head congestion     HPI Wendy [name] presents for tinnitus and decreased hearing  ongoign x 10 years, but worse over past 1-2 mo Of note, has had dx of breast ca. S/p treatment with radiation and chemotherapy regimen of dexamethasone, palonosetron, pegfilgrastim, cyclophosphamide, and docetaxel. Now on maintenance anastrazole, tolerating well overall, is very aware of follow up plans, risks, benefits, and AEs of medication, including that these may be contributing to her symptoms No hx of loud noise exposure or injury to ears No muffled hearing or drainage No pressure or pain in ears No new neuro symptoms or concerns ongoing.  Past Medical History:  Diagnosis Date  . Breast cancer ([place])   . Colon cancer ([place])   . DDD (degenerative disc disease), cervical   . DDD (degenerative disc disease), lumbar   . ESOPHAGITIS, REFLUX   . Glaucoma   . HELICOBACTER PYLORI INFECTION   . HEMORRHOIDS, INTERNAL   . History of colon cancer   . History of colon polyps 2019  . Hyperlipidemia   . Radius fracture 2006   Left, Distal  . Thoracic spondylosis   . Tinnitus, bilateral 2013    Past Surgical History:  Procedure Laterality Date  . BREAST LUMPECTOMY WITH RADIOACTIVE SEED AND SENTINEL LYMPH NODE BIOPSY Right 06/27/2019   Procedure: RIGHT BREAST LUMPECTOMY WITH RADIOACTIVE SEED AND SENTINEL LYMPH NODE BIOPSY AND RIGHT TARGETED NODE DISSECTION;  Surgeon: Jovita Kussmaul, MD;  Location: [facility];  Service: General;  Laterality: Right;   . BREAST RECONSTRUCTION WITH PLACEMENT OF TISSUE EXPANDER AND FLEX HD (ACELLULAR HYDRATED DERMIS) Right 09/08/2019   Procedure: BREAST RECONSTRUCTION WITH PLACEMENT OF TISSUE EXPANDER AND FLEX HD (ACELLULAR HYDRATED DERMIS);  Surgeon: Wallace Going, DO;  Location: [facility];  Service: Plastics;  Laterality: Right;  . BREAST REDUCTION WITH MASTOPEXY Left 01/02/2020   Procedure: MASTOPEXY / REDUCTION FOR SYMMETRY;  Surgeon: Wallace Going, DO;  Location: [facility];  Service: Plastics;  Laterality: Left;  . CESAREAN SECTION  1989  . COLONOSCOPY  2019  . HYSTEROSCOPY WITH D & C N/A 09/17/2018   Procedure: DILATATION AND CURETTAGE Marlene Bast;  Surgeon: Brien Few, MD;  Location: [facility];  Service: Gynecology;  Laterality: N/A;  . PORT-A-CATH REMOVAL Left 01/02/2020   Procedure: REMOVAL PORT-A-CATH;  Surgeon: Wallace Going, DO;  Location: [facility];  Service: Plastics;  Laterality: Left;  . PORTACATH PLACEMENT Left 08/01/2019   Procedure: INSERTION PORT-A-CATH;  Surgeon: Jovita Kussmaul, MD;  Location: [facility];  Service: General;  Laterality: Left;  . RE-EXCISION OF BREAST CANCER,SUPERIOR MARGINS Right 08/12/2019   Procedure: RE-EXCISION OF RIGHT BREAST,SUPERIOR MARGINS;  Surgeon: Jovita Kussmaul, MD;  Location: [facility] [place];  Service: General;  Laterality: Right;  . RE-EXCISION OF BREAST LUMPECTOMY Right 08/01/2019   Procedure: RE-EXCISION OF RIGHT BREAST CANCER  MARGINS;  Surgeon: Jovita Kussmaul, MD;  Location: [place];  Service: General;  Laterality: Right;  . REMOVAL OF TISSUE EXPANDER AND PLACEMENT OF IMPLANT Right 01/02/2020   Procedure: REMOVAL OF TISSUE EXPANDER AND PLACEMENT OF IMPLANT;  Surgeon: Wallace Going, DO;  Location: [place];  Service: Plastics;  Laterality: Right;  . SIMPLE MASTECTOMY WITH AXILLARY SENTINEL NODE BIOPSY  Right 09/08/2019   Procedure: RIGHT BREAST MASTECTOMY;  Surgeon: Jovita Kussmaul, MD;  Location: [facility];  Service: General;  Laterality: Right;  . TONSILLECTOMY     age 20 years old  . WRIST FRACTURE SURGERY  2006    Family History  Problem Relation Age of Onset  . Diabetes Mother   . Brain cancer Mother        mets  . Cancer Mother        breast-right  . Breast cancer Mother        d. 79  . Diabetes Father   . Hypertension Father   . Cancer Father        lung  . Heart disease Father   . Lung cancer Father   . Diabetes Sister   . Hypertension Sister   . Glaucoma Sister   . Diabetes Brother   . Hypertension Brother   . Heart disease Brother        congestive heart failure  . Other Son 16       car accident  . Colon cancer Maternal Grandmother 14       d. 37  . Dementia Paternal Grandfather   . Breast cancer Other        MGMs mother  . Other Maternal Uncle        possibly drowning  . Brain cancer Niece 1       d. 2 years    Social History   Socioeconomic History  . Marital status: Divorced    Spouse name: Not on file  . Number of children: Not on file  . Years of education: Not on file  . Highest education level: Not on file  Occupational History  . Occupation: medical    Employer: [employer]  Tobacco Use  . Smoking status: Never Smoker  . Smokeless tobacco: Never Used  Substance and Sexual Activity  . Alcohol use: Yes    Comment: occ  . Drug use: No  . Sexual activity: Not Currently  Other Topics Concern  . Not on file  Social History Narrative  . Not on file   Social Determinants of Health   Financial Resource Strain: Not on file  Food Insecurity: Not on file  Transportation Needs: Not on file  Physical Activity: Not on file  Stress: Not on file  Social Connections: Not on file  Intimate Partner Violence: Not on file    Outpatient Medications Prior to Visit  Medication Sig Dispense Refill  .  dorzolamide-timolol (COSOPT) 22.3-6.8 MG/ML ophthalmic solution PLACE 1 DROP INTO BOTH EYES 2 (TWO) TIMES DAILY.    . furosemide (LASIX) 20 MG tablet TAKE 1 TABLET BY MOUTH TWICE A DAY 60 tablet 1  . gabapentin (NEURONTIN) 300 MG capsule TAKE 1 CAPSULE BY MOUTH twice daily 180 capsule 3  . latanoprost (XALATAN) 0.005 % ophthalmic solution 1 drop at bedtime.     . meloxicam (MOBIC) 7.5 MG tablet Take 1 tablet (7.5 mg total) by mouth daily as needed for pain. 30 tablet 0  . triamcinolone ointment (KENALOG) 0.5 % APPLY TO AFFECTED AREA TWICE A DAY 30 g 0  .  venlafaxine (EFFEXOR) 37.5 MG tablet Take 1 tablet (37.5 mg total) by mouth daily. 90 tablet 3   Facility-Administered Medications Prior to Visit  Medication Dose Route Frequency Provider Last Rate Last Admin  . heparin lock flush 100 unit/mL  500 Units Intravenous Once Nicholas Lose, MD      . sodium chloride flush (NS) 0.9 % injection 10 mL  10 mL Intravenous PRN Nicholas Lose, MD   10 mL at 10/14/19 1032    Allergies  Allergen Reactions  . Hydrocodone-Acetaminophen Itching and Other (See Comments)    SEVERE    . Oxycodone Itching  . Other Other (See Comments)    rakuten eyedrops causes eye redness   . Vicodin [Hydrocodone-Acetaminophen] Itching    SEVERE   . Brimonidine Rash    ROS Review of Systems  Constitutional: Negative.   HENT: Negative.   Eyes: Negative.   Respiratory: Negative.   Cardiovascular: Negative.   Gastrointestinal: Negative.   Genitourinary: Negative.   Musculoskeletal: Negative.   Skin: Negative.   Neurological: Negative.   Psychiatric/Behavioral: Negative.   All other systems reviewed and are negative.     Objective:    Physical Exam Vitals and nursing note reviewed.  Constitutional:      General: She is not in acute distress.    Appearance: Normal appearance. She is normal weight. She is not ill-appearing, toxic-appearing or diaphoretic.  Cardiovascular:     Rate and Rhythm: Normal rate and  regular rhythm.     Heart sounds: Normal heart sounds. No murmur heard. No friction rub. No gallop.   Pulmonary:     Effort: Pulmonary effort is normal. No respiratory distress.     Breath sounds: Normal breath sounds. No stridor. No wheezing, rhonchi or rales.  Chest:     Chest wall: No tenderness.  Skin:    General: Skin is warm and dry.  Neurological:     General: No focal deficit present.     Mental Status: She is alert and oriented to person, place, and time. Mental status is at baseline.  Psychiatric:        Mood and Affect: Mood normal.        Behavior: Behavior normal.        Thought Content: Thought content normal.        Judgment: Judgment normal.     BP 110/78   Pulse 70   Temp 98.2 F (36.8 C) (Temporal)   Resp 15   Ht 5\' 3"  (1.6 m)   Wt 206 lb 12.8 oz (93.8 kg)   SpO2 96%   BMI 36.63 kg/m  Wt Readings from Last 3 Encounters:  10/25/20 206 lb 12.8 oz (93.8 kg)  09/20/20 209 lb (94.8 kg)  09/10/20 205 lb (93 kg)     There are no preventive care reminders to display for this patient.  There are no preventive care reminders to display for this patient.  Lab Results  Component Value Date   TSH 1.380 08/31/2017   Lab Results  Component Value Date   WBC 4.3 03/27/2020   HGB 14.2 03/27/2020   HCT 44.8 03/27/2020   MCV 87.2 03/27/2020   PLT 120 (L) 03/27/2020   Lab Results  Component Value Date   NA 139 03/27/2020   K 4.2 03/27/2020   CO2 29 03/27/2020   GLUCOSE 90 03/27/2020   BUN 10 03/27/2020   CREATININE 0.67 03/27/2020   BILITOT 0.5 03/27/2020   ALKPHOS 87 03/27/2020   AST 19 03/27/2020  ALT 10 03/27/2020   PROT 7.4 03/27/2020   ALBUMIN 3.8 03/27/2020   CALCIUM 9.4 03/27/2020   ANIONGAP 7 03/27/2020   Lab Results  Component Value Date   CHOL 217 (H) 04/30/2018   Lab Results  Component Value Date   HDL 66 04/30/2018   Lab Results  Component Value Date   LDLCALC 132 (H) 04/30/2018   Lab Results  Component Value Date   TRIG  95 04/30/2018   Lab Results  Component Value Date   CHOLHDL 3.3 04/30/2018   No results found for: HGBA1C    Assessment & Plan:   Problem List Items Addressed This Visit   None   Visit Diagnoses    Tinnitus of both ears    -  Primary   Relevant Orders   Ambulatory referral to Audiology   Decreased hearing, bilateral       Relevant Orders   Ambulatory referral to Audiology      No orders of the defined types were placed in this encounter.   Follow-up: No follow-ups on file.   PLAN  Discussed that cyclophosphamide poses risk of ototoxicity. With preexisting tinnitus, risk may be worse - but pt would benefit from audiology workup to discuss exact etiology of hearing loss and potential treatment options  Discussed nonpharm treatment for tinnitus, including white noise machines/background noise etc  Patient encouraged to call clinic with any questions, comments, or concerns.  Maximiano Coss, NP

## 2020-10-30 ENCOUNTER — Encounter: Payer: No Typology Code available for payment source | Admitting: Physical Therapy

## 2020-11-02 ENCOUNTER — Encounter: Payer: Self-pay | Admitting: Physical Therapy

## 2020-11-02 ENCOUNTER — Ambulatory Visit: Payer: No Typology Code available for payment source | Admitting: Physical Therapy

## 2020-11-02 ENCOUNTER — Other Ambulatory Visit: Payer: Self-pay

## 2020-11-02 DIAGNOSIS — Z483 Aftercare following surgery for neoplasm: Secondary | ICD-10-CM

## 2020-11-02 DIAGNOSIS — G8929 Other chronic pain: Secondary | ICD-10-CM

## 2020-11-02 DIAGNOSIS — L599 Disorder of the skin and subcutaneous tissue related to radiation, unspecified: Secondary | ICD-10-CM

## 2020-11-02 DIAGNOSIS — M25611 Stiffness of right shoulder, not elsewhere classified: Secondary | ICD-10-CM

## 2020-11-02 DIAGNOSIS — I890 Lymphedema, not elsewhere classified: Secondary | ICD-10-CM

## 2020-11-02 NOTE — Therapy (Signed)
[place], [place], [address] Phone: [phone]   Fax:  [phone]  Physical Therapy Treatment  Patient Details  Name: Wendy [name] MRN: [id number] Date of Birth: [birth year] Referring Provider (PT): Dr Marla Roe   Encounter Date: 11/02/2020   PT End of Session - 11/02/20 1213    Visit Number 4    Number of Visits 17    Date for PT Re-Evaluation 12/05/20    PT Start Time 1100    PT Stop Time 1153    PT Time Calculation (min) 53 min    Activity Tolerance Patient tolerated treatment well    Behavior During Therapy [facility] for tasks assessed/performed           Past Medical History:  Diagnosis Date  . Breast cancer ([place])   . Colon cancer ([place])   . DDD (degenerative disc disease), cervical   . DDD (degenerative disc disease), lumbar   . ESOPHAGITIS, REFLUX   . Glaucoma   . HELICOBACTER PYLORI INFECTION   . HEMORRHOIDS, INTERNAL   . History of colon cancer   . History of colon polyps 2019  . Hyperlipidemia   . Radius fracture 2006   Left, Distal  . Thoracic spondylosis   . Tinnitus, bilateral 2013    Past Surgical History:  Procedure Laterality Date  . BREAST LUMPECTOMY WITH RADIOACTIVE SEED AND SENTINEL LYMPH NODE BIOPSY Right 06/27/2019   Procedure: RIGHT BREAST LUMPECTOMY WITH RADIOACTIVE SEED AND SENTINEL LYMPH NODE BIOPSY AND RIGHT TARGETED NODE DISSECTION;  Surgeon: Jovita Kussmaul, MD;  Location: [place];  Service: General;  Laterality: Right;  . BREAST RECONSTRUCTION WITH PLACEMENT OF TISSUE EXPANDER AND FLEX HD (ACELLULAR HYDRATED DERMIS) Right 09/08/2019   Procedure: BREAST RECONSTRUCTION WITH PLACEMENT OF TISSUE EXPANDER AND FLEX HD (ACELLULAR HYDRATED DERMIS);  Surgeon: Wallace Going, DO;  Location: [place];  Service: Plastics;  Laterality: Right;  . BREAST REDUCTION WITH MASTOPEXY Left 01/02/2020   Procedure: MASTOPEXY / REDUCTION FOR  SYMMETRY;  Surgeon: Wallace Going, DO;  Location: [place];  Service: Plastics;  Laterality: Left;  . CESAREAN SECTION  1989  . COLONOSCOPY  2019  . HYSTEROSCOPY WITH D & C N/A 09/17/2018   Procedure: DILATATION AND CURETTAGE Marlene Bast;  Surgeon: Brien Few, MD;  Location: [place];  Service: Gynecology;  Laterality: N/A;  . PORT-A-CATH REMOVAL Left 01/02/2020   Procedure: REMOVAL PORT-A-CATH;  Surgeon: Wallace Going, DO;  Location: [place];  Service: Plastics;  Laterality: Left;  . PORTACATH PLACEMENT Left 08/01/2019   Procedure: INSERTION PORT-A-CATH;  Surgeon: Jovita Kussmaul, MD;  Location: [place];  Service: General;  Laterality: Left;  . RE-EXCISION OF BREAST CANCER,SUPERIOR MARGINS Right 08/12/2019   Procedure: RE-EXCISION OF RIGHT BREAST,SUPERIOR MARGINS;  Surgeon: Jovita Kussmaul, MD;  Location: [place];  Service: General;  Laterality: Right;  . RE-EXCISION OF BREAST LUMPECTOMY Right 08/01/2019   Procedure: RE-EXCISION OF RIGHT BREAST CANCER  MARGINS;  Surgeon: Jovita Kussmaul, MD;  Location: [place];  Service: General;  Laterality: Right;  . REMOVAL OF TISSUE EXPANDER AND PLACEMENT OF IMPLANT Right 01/02/2020   Procedure: REMOVAL OF TISSUE EXPANDER AND PLACEMENT OF IMPLANT;  Surgeon: Wallace Going, DO;  Location: [place];  Service: Plastics;  Laterality: Right;  . SIMPLE MASTECTOMY WITH AXILLARY SENTINEL NODE BIOPSY Right 09/08/2019   Procedure: RIGHT BREAST MASTECTOMY;  Surgeon: Jovita Kussmaul, MD;  Location: [place];  Service: General;  Laterality: Right;  . TONSILLECTOMY     age 65 years old  . WRIST FRACTURE SURGERY  2006    There were no vitals filed for this visit.   Subjective Assessment - 11/02/20 1107    Subjective Pt reports she is having some tightness today    Pertinent History Rt lumpectomy grade 3  DCIS on 06/27/19 with Dr. [name] and Re-excision on 08/12/19, then right mastectomy with immediate expander placement and 8 lymph nodes removed Dr. Marla Roe for Plastics.  Recieved  chemotherapy of Cytoxan and Taxotere and had radiation.  Right implant was in June 2021    Patient Stated Goals to get relief from pain    Currently in Pain? Yes    Pain Score 4                      Flowsheet Row Outpatient Rehab from 10/21/2019 in [place]  Lymphedema Life Impact Scale Total Score 7.35 %            [facility] Adult PT Treatment/Exercise - 11/02/20 0001      Exercises   Exercises Shoulder      Shoulder Exercises: Sidelying   Other Sidelying Exercises with hand pointed to celiing, small circles, figure 8      Shoulder Exercises: Pulleys   Flexion 2 minutes      Shoulder Exercises: Therapy Ball   Flexion Right;Left;5 reps    Other Therapy Ball Exercises forearm ball for scapular movement      Shoulder Exercises: ROM/Strengthening   Wall Wash with hand on folded towel in multiple directions    Other ROM/Strengthening Exercises standing row with elbows straight with yellow theraband for scapular retraction      Manual Therapy   Manual Therapy Soft tissue mobilization;Myofascial release;Passive ROM    Manual therapy comments provided foam patch for inside bra    Soft tissue mobilization in sidelying and with with cocoa butter to right upper traps, scapular and posterior shoulder mucles.  Attempted to do pec major, but pt still with marked tenderness to this area. Pt also with tightness in deep right neck muscles.    Myofascial Release with prolonged pressure to trigger points  also with crossed hands to skin on anterior chest and around implant    Passive ROM to right shoulder.  Pt with decreased external rotation with severe tightness at pec major                    PT Short Term Goals - 10/24/20 1618      PT SHORT TERM GOAL #1    Title Pt will have a good  fitting compression sleeve for right arm    Status Achieved      PT SHORT TERM GOAL #2   Title She will report pain decr 25% decrease in raising her arm overhead    Time 4    Period Weeks    Status On-going      PT SHORT TERM GOAL #3   Title Pt will be independent in a basic HEP for upper body stretching    Period Weeks    Status On-going      PT SHORT TERM GOAL #4   Title Pt will decrease Quick DASH score to < 20 indicating an improvement in right UE function    Baseline 31.82 on 10/05/2020    Time  8    Status On-going             PT Long Term Goals - 10/24/20 1618      PT LONG TERM GOAL #1   Title Pt will increase Rt shoulder AROM to at least: 150 degrees of right shoulder flexion and abuction to increase mobility    Baseline 10/05/2020 120 flex, 115 abd    Period Weeks    Status On-going      PT LONG TERM GOAL #2   Title Pt will report the pain in her right upper quadrant is decreased by 50%    Time 8    Period Weeks    Status On-going      PT LONG TERM GOAL #3   Title Pt will be independent in HEP for ROM, strength and fitness that she reports she can continue at home    Time 8    Period Weeks    Status On-going      PT LONG TERM GOAL #4   Title Pt will decrease Quick DASH score to < 20 indicating an improvement in function of RUE    Baseline 31.82 on 10/05/2020    Time 8    Period Weeks    Status On-going                 Plan - 11/02/20 1213    Clinical Impression Statement Pt continues to have very tender  tightness in her pec and axillary muscles with darkening of skin that is persistent despite manual work.  Added gentle foam patch to wear inside sports bra to see if gentle compression will help decrase pain.  Upgraded exercise to pulleys, wall stretches, and scapular strengthening.    Personal Factors and Comorbidities Comorbidity 3+    Comorbidities lymph node removal, chemotherapy, radiation.    Examination-Activity  Limitations Reach Overhead;Caring for Others;Carry;Lift;Hygiene/Grooming;Sleep    Examination-Participation Restrictions Yard Work;Cleaning;Laundry;Meal Prep;Shop    PT Next Visit Plan how was foam patch ?Soft tissue work, myofascial release, stretching, active ROM for shoulder, neck and thoracic spine.continue pulleys and wall exercise and progress    Consulted and Agree with Plan of Care Patient           Patient will benefit from skilled therapeutic intervention in order to improve the following deficits and impairments:  Pain,Decreased scar mobility,Postural dysfunction,Decreased range of motion,Increased edema,Decreased skin integrity,Increased muscle spasms,Decreased activity tolerance,Decreased strength,Increased fascial restricitons,Impaired flexibility,Impaired UE functional use  Visit Diagnosis: Disorder of the skin and subcutaneous tissue related to radiation, unspecified  Aftercare following surgery for neoplasm  Stiffness of right shoulder, not elsewhere classified  Lymphedema  Chronic right shoulder pain     Problem List Patient Active Problem List   Diagnosis Date Noted  . S/P breast reconstruction, right 01/23/2020  . History of reduction surgery of left breast 01/23/2020  . Port-A-Cath in place 10/20/2019  . S/P mastectomy, right 10/19/2019  . Acquired absence of breast 09/16/2019  . Breast cancer ([place]) 09/08/2019  . Genetic testing 06/10/2019  . Malignant neoplasm of upper-outer quadrant of right breast in female, estrogen receptor positive ([place]) 06/01/2019  . Family history of breast cancer 06/01/2019  . Family history of colon cancer 06/01/2019  . Family history of brain cancer 06/01/2019  . History of colon cancer   . Abnormal colonoscopy 10/30/2017  . Chronic glaucoma 08/11/2013  . HELICOBACTER PYLORI INFECTION 08/27/2007  . GERD 08/27/2007  . ESOPHAGITIS, REFLUX 05/17/2007  . HEMORRHOIDS, INTERNAL 09/22/2003   Helene Kelp  Bethena Midget, PT  [name] 11/02/2020, 12:17 PM  [place], [place], [address] Phone: [phone]   Fax:  [phone]  Name: RUFUS CYPERT MRN: [id number] Date of Birth: 11/12/1955

## 2020-11-06 ENCOUNTER — Encounter: Payer: No Typology Code available for payment source | Admitting: Physical Therapy

## 2020-11-09 ENCOUNTER — Other Ambulatory Visit: Payer: Self-pay

## 2020-11-09 ENCOUNTER — Encounter: Payer: Self-pay | Admitting: Physical Therapy

## 2020-11-09 ENCOUNTER — Ambulatory Visit: Payer: No Typology Code available for payment source | Attending: Surgical | Admitting: Physical Therapy

## 2020-11-09 DIAGNOSIS — M25511 Pain in right shoulder: Secondary | ICD-10-CM | POA: Insufficient documentation

## 2020-11-09 DIAGNOSIS — G8929 Other chronic pain: Secondary | ICD-10-CM | POA: Insufficient documentation

## 2020-11-09 DIAGNOSIS — Z483 Aftercare following surgery for neoplasm: Secondary | ICD-10-CM | POA: Diagnosis present

## 2020-11-09 DIAGNOSIS — H9313 Tinnitus, bilateral: Secondary | ICD-10-CM | POA: Diagnosis present

## 2020-11-09 DIAGNOSIS — L599 Disorder of the skin and subcutaneous tissue related to radiation, unspecified: Secondary | ICD-10-CM

## 2020-11-09 DIAGNOSIS — H9193 Unspecified hearing loss, bilateral: Secondary | ICD-10-CM | POA: Insufficient documentation

## 2020-11-09 DIAGNOSIS — M25611 Stiffness of right shoulder, not elsewhere classified: Secondary | ICD-10-CM | POA: Diagnosis present

## 2020-11-09 DIAGNOSIS — I890 Lymphedema, not elsewhere classified: Secondary | ICD-10-CM

## 2020-11-09 NOTE — Patient Instructions (Signed)

## 2020-11-09 NOTE — Therapy (Signed)
[place], [place], [address] Phone: [phone]   Fax:  [phone]  Physical Therapy Treatment  Patient Details  Name: Wendy [name] MRN: [id number] Date of Birth: 1956-06-24 Referring Provider (PT): Dr Marla Roe   Encounter Date: 11/09/2020   PT End of Session - 11/09/20 1207    Visit Number 5    Number of Visits 17    Date for PT Re-Evaluation 12/05/20    PT Start Time 1100    PT Stop Time 1150    PT Time Calculation (min) 50 min    Activity Tolerance Patient tolerated treatment well    Behavior During Therapy [facility] for tasks assessed/performed           Past Medical History:  Diagnosis Date  . Breast cancer ([place])   . Colon cancer ([place])   . DDD (degenerative disc disease), cervical   . DDD (degenerative disc disease), lumbar   . ESOPHAGITIS, REFLUX   . Glaucoma   . HELICOBACTER PYLORI INFECTION   . HEMORRHOIDS, INTERNAL   . History of colon cancer   . History of colon polyps 2019  . Hyperlipidemia   . Radius fracture 2006   Left, Distal  . Thoracic spondylosis   . Tinnitus, bilateral 2013    Past Surgical History:  Procedure Laterality Date  . BREAST LUMPECTOMY WITH RADIOACTIVE SEED AND SENTINEL LYMPH NODE BIOPSY Right 06/27/2019   Procedure: RIGHT BREAST LUMPECTOMY WITH RADIOACTIVE SEED AND SENTINEL LYMPH NODE BIOPSY AND RIGHT TARGETED NODE DISSECTION;  Surgeon: Jovita Kussmaul, MD;  Location: [place];  Service: General;  Laterality: Right;  . BREAST RECONSTRUCTION WITH PLACEMENT OF TISSUE EXPANDER AND FLEX HD (ACELLULAR HYDRATED DERMIS) Right 09/08/2019   Procedure: BREAST RECONSTRUCTION WITH PLACEMENT OF TISSUE EXPANDER AND FLEX HD (ACELLULAR HYDRATED DERMIS);  Surgeon: Wallace Going, DO;  Location: [place];  Service: Plastics;  Laterality: Right;  . BREAST REDUCTION WITH MASTOPEXY Left 01/02/2020   Procedure: MASTOPEXY / REDUCTION FOR  SYMMETRY;  Surgeon: Wallace Going, DO;  Location: [place];  Service: Plastics;  Laterality: Left;  . CESAREAN SECTION  1989  . COLONOSCOPY  2019  . HYSTEROSCOPY WITH D & C N/A 09/17/2018   Procedure: DILATATION AND CURETTAGE Marlene Bast;  Surgeon: Brien Few, MD;  Location: [place];  Service: Gynecology;  Laterality: N/A;  . PORT-A-CATH REMOVAL Left 01/02/2020   Procedure: REMOVAL PORT-A-CATH;  Surgeon: Wallace Going, DO;  Location: [place];  Service: Plastics;  Laterality: Left;  . PORTACATH PLACEMENT Left 08/01/2019   Procedure: INSERTION PORT-A-CATH;  Surgeon: Jovita Kussmaul, MD;  Location: [place];  Service: General;  Laterality: Left;  . RE-EXCISION OF BREAST CANCER,SUPERIOR MARGINS Right 08/12/2019   Procedure: RE-EXCISION OF RIGHT BREAST,SUPERIOR MARGINS;  Surgeon: Jovita Kussmaul, MD;  Location: [place];  Service: General;  Laterality: Right;  . RE-EXCISION OF BREAST LUMPECTOMY Right 08/01/2019   Procedure: RE-EXCISION OF RIGHT BREAST CANCER  MARGINS;  Surgeon: Jovita Kussmaul, MD;  Location: [place];  Service: General;  Laterality: Right;  . REMOVAL OF TISSUE EXPANDER AND PLACEMENT OF IMPLANT Right 01/02/2020   Procedure: REMOVAL OF TISSUE EXPANDER AND PLACEMENT OF IMPLANT;  Surgeon: Wallace Going, DO;  Location: [place];  Service: Plastics;  Laterality: Right;  . SIMPLE MASTECTOMY WITH AXILLARY SENTINEL NODE BIOPSY Right 09/08/2019   Procedure: RIGHT BREAST MASTECTOMY;  Surgeon: Jovita Kussmaul, MD;  Location: [place];  Service: General;  Laterality: Right;  . TONSILLECTOMY     age 7 years old  . WRIST FRACTURE SURGERY  2006    There were no vitals filed for this visit.   Subjective Assessment - 11/09/20 1107    Subjective Pt says she is doing a little better. She wore the foam patch and she thinked it helped a  little                     Flowsheet Row Outpatient Rehab from 10/21/2019 in [place]  Lymphedema Life Impact Scale Total Score 7.35 %            [facility] Adult PT Treatment/Exercise - 11/09/20 0001      Self-Care   Self-Care Other Self-Care Comments    Other Self-Care Comments  talked to pt about Flexitouch to help with lymphedema in right arm, chest trunk and she is agreeable to having benefits check      Exercises   Exercises Other Exercises;Shoulder    Other Exercises  sitting neck and scapular ROM Gave pt information from [place] about online yoga and suggested pt look for some chair yoga on line. Also talked with pt about aquatics and she may be interested      Shoulder Exercises: Supine   Horizontal ABduction Strengthening;Both;5 reps;Theraband    Theraband Level (Shoulder Horizontal ABduction) Level 1 (Yellow)    External Rotation Strengthening;Both;5 reps;Theraband    Theraband Level (Shoulder External Rotation) Level 1 (Yellow)    Flexion Strengthening;Both;5 reps;Theraband   narrow and wide grip   Theraband Level (Shoulder Flexion) Level 1 (Yellow)    Diagonals Strengthening;Both;5 reps;Theraband    Theraband Level (Shoulder Diagonals) Level 1 (Yellow)      Shoulder Exercises: Standing   Row Strengthening;Both;5 reps    Theraband Level (Shoulder Row) Level 2 (Red)      Manual Therapy   Manual Therapy Myofascial release    Myofascial Release with prolonged pressure to trigger points  in axilla  also with crossed hands to skin on anterior chest and around implant                    PT Short Term Goals - 10/24/20 1618      PT SHORT TERM GOAL #1   Title Pt will have a good  fitting compression sleeve for right arm    Status Achieved      PT SHORT TERM GOAL #2   Title She will report pain decr 25% decrease in raising her arm overhead    Time 4    Period Weeks    Status On-going      PT SHORT TERM GOAL  #3   Title Pt will be independent in a basic HEP for upper body stretching    Period Weeks    Status On-going      PT SHORT TERM GOAL #4   Title Pt will decrease Quick DASH score to < 20 indicating an improvement in right UE function    Baseline 31.82 on 10/05/2020    Time 8    Status On-going             PT Long Term Goals - 10/24/20 1618      PT LONG TERM GOAL #1   Title Pt will increase Rt shoulder AROM to at least: 150 degrees of right shoulder flexion and abuction to increase  mobility    Baseline 10/05/2020 120 flex, 115 abd    Period Weeks    Status On-going      PT LONG TERM GOAL #2   Title Pt will report the pain in her right upper quadrant is decreased by 50%    Time 8    Period Weeks    Status On-going      PT LONG TERM GOAL #3   Title Pt will be independent in HEP for ROM, strength and fitness that she reports she can continue at home    Time 8    Period Weeks    Status On-going      PT LONG TERM GOAL #4   Title Pt will decrease Quick DASH score to < 20 indicating an improvement in function of RUE    Baseline 31.82 on 10/05/2020    Time 8    Period Weeks    Status On-going                 Plan - 11/09/20 1207    Clinical Impression Statement Pt continues to struggle with pain, decreased range of motion, fibrotic fullness in pec and lateral chest with fullness at implant and lateral chest as well as lymphedema in her arm despite wearing her compression bra, sleeve, extra foam patches, elevation and exercise.  She would benefit from a Flexitouch to address lymphedema and help her manage her daily activities and improve function will less pain and pain medicine Upgraded  supine strengthening today    Personal Factors and Comorbidities Comorbidity 3+    Comorbidities lymph node removal, chemotherapy, radiation.    Examination-Activity Limitations Reach Overhead;Caring for Others;Carry;Lift;Hygiene/Grooming;Sleep    Examination-Participation Restrictions Yard  Work;Cleaning;Laundry;Meal Prep;Shop    Stability/Clinical Decision Making Evolving/Moderate complexity    Clinical Decision Making Moderate    Rehab Potential Good    PT Frequency 2x / week    PT Duration 8 weeks    PT Treatment/Interventions ADLs/Self Care Home Management;DME Instruction;Therapeutic exercise;Patient/family education;Manual techniques;Manual lymph drainage;Compression bandaging;Passive range of motion;Therapeutic activities;Neuromuscular re-education;Orthotic Fit/Training;Scar mobilization;Dry needling;Taping    PT Next Visit Plan assess tolerance of exericse and progress strengthening as tolerated. continue stretching and MLD and manual work  to right upper quadrant    Consulted and Agree with Plan of Care Patient           Patient will benefit from skilled therapeutic intervention in order to improve the following deficits and impairments:  Pain,Decreased scar mobility,Postural dysfunction,Decreased range of motion,Increased edema,Decreased skin integrity,Increased muscle spasms,Decreased activity tolerance,Decreased strength,Increased fascial restricitons,Impaired flexibility,Impaired UE functional use  Visit Diagnosis: Disorder of the skin and subcutaneous tissue related to radiation, unspecified  Aftercare following surgery for neoplasm  Stiffness of right shoulder, not elsewhere classified  Lymphedema  Chronic right shoulder pain     Problem List Patient Active Problem List   Diagnosis Date Noted  . S/P breast reconstruction, right 01/23/2020  . History of reduction surgery of left breast 01/23/2020  . Port-A-Cath in place 10/20/2019  . S/P mastectomy, right 10/19/2019  . Acquired absence of breast 09/16/2019  . Breast cancer ([place]) 09/08/2019  . Genetic testing 06/10/2019  . Malignant neoplasm of upper-outer quadrant of right breast in female, estrogen receptor positive ([place]) 06/01/2019  . Family history of breast cancer 06/01/2019  . Family history  of colon cancer 06/01/2019  . Family history of brain cancer 06/01/2019  . History of colon cancer   . Abnormal colonoscopy 10/30/2017  . Chronic glaucoma 08/11/2013  .  HELICOBACTER PYLORI INFECTION 08/27/2007  . GERD 08/27/2007  . ESOPHAGITIS, REFLUX 05/17/2007  . HEMORRHOIDS, INTERNAL 09/22/2003   Donato Heinz. Owens Shark PT  [name] 11/09/2020, 12:15 PM  [place], [place], [address] Phone: [phone]   Fax:  [phone]  Name: Wendy [name] MRN: [id number] Date of Birth: [birth year]

## 2020-11-12 ENCOUNTER — Other Ambulatory Visit: Payer: Self-pay | Admitting: *Deleted

## 2020-11-12 MED ORDER — VENLAFAXINE HCL ER 75 MG PO CP24: 75.0000 mg | ORAL_CAPSULE | Freq: Every day | ORAL | 1 refills | Status: DC

## 2020-11-12 NOTE — Progress Notes (Signed)
Received call from pt with complaint of hot flashes unrelieved by Effexor 37.5 mg daily.  Per MD pt to increase Effexor to 75 mg p.o daily to alleviate hot flashes.  Prescription sent to pharmacy on file.  Pt also complaint of ongoing neuropathy in bilateral fingers and toes unrelieved by Gabapentin 30 mg daily at bedtime.  Per MD pt to take an additional dose of Gabapentin 300 mg p.o in the afternoon. If pt becomes drowsy and is unable to preform essential work functions, MD states pt may be switched to Cymbalta for neuropathy.  Pt educated and verbalized understanding.

## 2020-11-13 ENCOUNTER — Encounter: Payer: No Typology Code available for payment source | Admitting: Physical Therapy

## 2020-11-14 ENCOUNTER — Other Ambulatory Visit: Payer: Self-pay | Admitting: Hematology and Oncology

## 2020-11-16 ENCOUNTER — Ambulatory Visit: Payer: No Typology Code available for payment source | Admitting: Rehabilitation

## 2020-11-16 ENCOUNTER — Other Ambulatory Visit: Payer: Self-pay

## 2020-11-16 ENCOUNTER — Encounter: Payer: Self-pay | Admitting: Rehabilitation

## 2020-11-16 DIAGNOSIS — G8929 Other chronic pain: Secondary | ICD-10-CM

## 2020-11-16 DIAGNOSIS — M25611 Stiffness of right shoulder, not elsewhere classified: Secondary | ICD-10-CM

## 2020-11-16 DIAGNOSIS — Z483 Aftercare following surgery for neoplasm: Secondary | ICD-10-CM

## 2020-11-16 DIAGNOSIS — L599 Disorder of the skin and subcutaneous tissue related to radiation, unspecified: Secondary | ICD-10-CM

## 2020-11-16 DIAGNOSIS — M25511 Pain in right shoulder: Secondary | ICD-10-CM

## 2020-11-16 DIAGNOSIS — I890 Lymphedema, not elsewhere classified: Secondary | ICD-10-CM

## 2020-11-16 NOTE — Therapy (Signed)
[place], [place], [address] Phone: [phone]   Fax:  [phone]  Physical Therapy Treatment  Patient Details  Name: Wendy [name] MRN: [id number] Date of Birth: May 25, 1956 Referring Provider (PT): Dr Marla Roe   Encounter Date: 11/16/2020   PT End of Session - 11/16/20 1204    Visit Number 6    Number of Visits 17    Date for PT Re-Evaluation 12/05/20    PT Start Time 1104    PT Stop Time 1157    PT Time Calculation (min) 53 min    Activity Tolerance Patient tolerated treatment well    Behavior During Therapy [facility] for tasks assessed/performed           Past Medical History:  Diagnosis Date  . Breast cancer ([place])   . Colon cancer ([place])   . DDD (degenerative disc disease), cervical   . DDD (degenerative disc disease), lumbar   . ESOPHAGITIS, REFLUX   . Glaucoma   . HELICOBACTER PYLORI INFECTION   . HEMORRHOIDS, INTERNAL   . History of colon cancer   . History of colon polyps 2019  . Hyperlipidemia   . Radius fracture 2006   Left, Distal  . Thoracic spondylosis   . Tinnitus, bilateral 2013    Past Surgical History:  Procedure Laterality Date  . BREAST LUMPECTOMY WITH RADIOACTIVE SEED AND SENTINEL LYMPH NODE BIOPSY Right 06/27/2019   Procedure: RIGHT BREAST LUMPECTOMY WITH RADIOACTIVE SEED AND SENTINEL LYMPH NODE BIOPSY AND RIGHT TARGETED NODE DISSECTION;  Surgeon: Jovita Kussmaul, MD;  Location: [place];  Service: General;  Laterality: Right;  . BREAST RECONSTRUCTION WITH PLACEMENT OF TISSUE EXPANDER AND FLEX HD (ACELLULAR HYDRATED DERMIS) Right 09/08/2019   Procedure: BREAST RECONSTRUCTION WITH PLACEMENT OF TISSUE EXPANDER AND FLEX HD (ACELLULAR HYDRATED DERMIS);  Surgeon: Wallace Going, DO;  Location: [place];  Service: Plastics;  Laterality: Right;  . BREAST REDUCTION WITH MASTOPEXY Left 01/02/2020   Procedure: MASTOPEXY / REDUCTION FOR  SYMMETRY;  Surgeon: Wallace Going, DO;  Location: [place];  Service: Plastics;  Laterality: Left;  . CESAREAN SECTION  1989  . COLONOSCOPY  2019  . HYSTEROSCOPY WITH D & C N/A 09/17/2018   Procedure: DILATATION AND CURETTAGE Marlene Bast;  Surgeon: Brien Few, MD;  Location: [place];  Service: Gynecology;  Laterality: N/A;  . PORT-A-CATH REMOVAL Left 01/02/2020   Procedure: REMOVAL PORT-A-CATH;  Surgeon: Wallace Going, DO;  Location: [place];  Service: Plastics;  Laterality: Left;  . PORTACATH PLACEMENT Left 08/01/2019   Procedure: INSERTION PORT-A-CATH;  Surgeon: Jovita Kussmaul, MD;  Location: [place];  Service: General;  Laterality: Left;  . RE-EXCISION OF BREAST CANCER,SUPERIOR MARGINS Right 08/12/2019   Procedure: RE-EXCISION OF RIGHT BREAST,SUPERIOR MARGINS;  Surgeon: Jovita Kussmaul, MD;  Location: [place];  Service: General;  Laterality: Right;  . RE-EXCISION OF BREAST LUMPECTOMY Right 08/01/2019   Procedure: RE-EXCISION OF RIGHT BREAST CANCER  MARGINS;  Surgeon: Jovita Kussmaul, MD;  Location: [place];  Service: General;  Laterality: Right;  . REMOVAL OF TISSUE EXPANDER AND PLACEMENT OF IMPLANT Right 01/02/2020   Procedure: REMOVAL OF TISSUE EXPANDER AND PLACEMENT OF IMPLANT;  Surgeon: Wallace Going, DO;  Location: [place];  Service: Plastics;  Laterality: Right;  . SIMPLE MASTECTOMY WITH AXILLARY SENTINEL NODE BIOPSY Right 09/08/2019   Procedure: RIGHT BREAST MASTECTOMY;  Surgeon: Jovita Kussmaul, MD;  Location: [place];  Service: General;  Laterality: Right;  . TONSILLECTOMY     age 65 years old  . WRIST FRACTURE SURGERY  2006    There were no vitals filed for this visit.   Subjective Assessment - 11/16/20 1108    Subjective They called and said the pump was ready but for the left arm    Pertinent History Rt  lumpectomy grade 3 DCIS on 06/27/19 with Dr. [name] and Re-excision on 08/12/19, then right mastectomy with immediate expander placement and 8 lymph nodes removed Dr. Marla Roe for Plastics.  Recieved  chemotherapy of Cytoxan and Taxotere and had radiation.  Right implant was in June 2021    Currently in Pain? No/denies                     Flowsheet Row Outpatient Rehab from 10/21/2019 in [place]  Lymphedema Life Impact Scale Total Score 7.35 %            [facility] Adult PT Treatment/Exercise - 11/16/20 0001      Manual Therapy   Soft tissue mobilization in sidelying and with with cocoa butter to right upper traps, scapular and posterior shoulder mucles.  Pt tolerated gentle pectoralis work today improved from last visit.    Myofascial Release surrounding implant, Rt axilla gentle over fragile skin    Passive ROM to right shoulder into flexion, abduction, ER, and horizontal abduction                    PT Short Term Goals - 10/24/20 1618      PT SHORT TERM GOAL #1   Title Pt will have a good  fitting compression sleeve for right arm    Status Achieved      PT SHORT TERM GOAL #2   Title She will report pain decr 25% decrease in raising her arm overhead    Time 4    Period Weeks    Status On-going      PT SHORT TERM GOAL #3   Title Pt will be independent in a basic HEP for upper body stretching    Period Weeks    Status On-going      PT SHORT TERM GOAL #4   Title Pt will decrease Quick DASH score to < 20 indicating an improvement in right UE function    Baseline 31.82 on 10/05/2020    Time 8    Status On-going             PT Long Term Goals - 10/24/20 1618      PT LONG TERM GOAL #1   Title Pt will increase Rt shoulder AROM to at least: 150 degrees of right shoulder flexion and abuction to increase mobility    Baseline 10/05/2020 120 flex, 115 abd    Period Weeks    Status On-going      PT LONG TERM GOAL #2    Title Pt will report the pain in her right upper quadrant is decreased by 50%    Time 8    Period Weeks    Status On-going      PT LONG TERM GOAL #3   Title Pt will be independent in HEP for ROM, strength and fitness that she reports she can continue at home    Time 8    Period Weeks    Status On-going      PT  LONG TERM GOAL #4   Title Pt will decrease Quick DASH score to < 20 indicating an improvement in function of RUE    Baseline 31.82 on 10/05/2020    Time 8    Period Weeks    Status On-going                 Plan - 11/16/20 1205    Clinical Impression Statement Pt tolerated treatment well today with more tolerance to PROM and STM especially at the tight pectoralis band lateral to the implant.  overall tightness upper right quadrant anterior and posterior.  Flexitouch is ready to go but was set up for the wrong side so this needs to be fixed per pt.    PT Frequency 2x / week    PT Duration 8 weeks    PT Treatment/Interventions ADLs/Self Care Home Management;DME Instruction;Therapeutic exercise;Patient/family education;Manual techniques;Manual lymph drainage;Compression bandaging;Passive range of motion;Therapeutic activities;Neuromuscular re-education;Orthotic Fit/Training;Scar mobilization;Dry needling;Taping    PT Next Visit Plan continue stretching and MLD and manual work  to right upper quadrant    Consulted and Agree with Plan of Care Patient           Patient will benefit from skilled therapeutic intervention in order to improve the following deficits and impairments:     Visit Diagnosis: Disorder of the skin and subcutaneous tissue related to radiation, unspecified  Aftercare following surgery for neoplasm  Stiffness of right shoulder, not elsewhere classified  Lymphedema  Chronic right shoulder pain     Problem List Patient Active Problem List   Diagnosis Date Noted  . S/P breast reconstruction, right 01/23/2020  . History of reduction surgery of  left breast 01/23/2020  . Port-A-Cath in place 10/20/2019  . S/P mastectomy, right 10/19/2019  . Acquired absence of breast 09/16/2019  . Breast cancer ([place]) 09/08/2019  . Genetic testing 06/10/2019  . Malignant neoplasm of upper-outer quadrant of right breast in female, estrogen receptor positive ([place]) 06/01/2019  . Family history of breast cancer 06/01/2019  . Family history of colon cancer 06/01/2019  . Family history of brain cancer 06/01/2019  . History of colon cancer   . Abnormal colonoscopy 10/30/2017  . Chronic glaucoma 08/11/2013  . HELICOBACTER PYLORI INFECTION 08/27/2007  . GERD 08/27/2007  . ESOPHAGITIS, REFLUX 05/17/2007  . HEMORRHOIDS, INTERNAL 09/22/2003    Stark Bray 11/16/2020, 12:06 PM  [place], [place], [address] Phone: [phone]   Fax:  [phone]  Name: Wendy [name] MRN: [id number] Date of Birth: Aug 10, 1955

## 2020-11-20 ENCOUNTER — Encounter: Payer: No Typology Code available for payment source | Admitting: Physical Therapy

## 2020-11-23 ENCOUNTER — Ambulatory Visit: Payer: No Typology Code available for payment source | Admitting: Physical Therapy

## 2020-11-23 ENCOUNTER — Other Ambulatory Visit: Payer: Self-pay

## 2020-11-23 DIAGNOSIS — Z483 Aftercare following surgery for neoplasm: Secondary | ICD-10-CM

## 2020-11-23 DIAGNOSIS — L599 Disorder of the skin and subcutaneous tissue related to radiation, unspecified: Secondary | ICD-10-CM

## 2020-11-23 DIAGNOSIS — M25611 Stiffness of right shoulder, not elsewhere classified: Secondary | ICD-10-CM

## 2020-11-23 DIAGNOSIS — I890 Lymphedema, not elsewhere classified: Secondary | ICD-10-CM

## 2020-11-23 DIAGNOSIS — G8929 Other chronic pain: Secondary | ICD-10-CM

## 2020-11-23 NOTE — Therapy (Signed)
[place], [place], [address] Phone: [phone]   Fax:  [phone]  Physical Therapy Treatment  Patient Details  Name: Wendy [name] MRN: [id number] Date of Birth: [birth year] Referring Provider (PT): Dr Marla Roe   Encounter Date: 11/23/2020   PT End of Session - 11/23/20 1207    Visit Number 7    Number of Visits 17    Date for PT Re-Evaluation 12/05/20    PT Start Time 1103    PT Stop Time 1148    PT Time Calculation (min) 45 min    Activity Tolerance Patient tolerated treatment well    Behavior During Therapy [facility] for tasks assessed/performed           Past Medical History:  Diagnosis Date  . Breast cancer ([place])   . Colon cancer ([place])   . DDD (degenerative disc disease), cervical   . DDD (degenerative disc disease), lumbar   . ESOPHAGITIS, REFLUX   . Glaucoma   . HELICOBACTER PYLORI INFECTION   . HEMORRHOIDS, INTERNAL   . History of colon cancer   . History of colon polyps 2019  . Hyperlipidemia   . Radius fracture 2006   Left, Distal  . Thoracic spondylosis   . Tinnitus, bilateral 2013    Past Surgical History:  Procedure Laterality Date  . BREAST LUMPECTOMY WITH RADIOACTIVE SEED AND SENTINEL LYMPH NODE BIOPSY Right 06/27/2019   Procedure: RIGHT BREAST LUMPECTOMY WITH RADIOACTIVE SEED AND SENTINEL LYMPH NODE BIOPSY AND RIGHT TARGETED NODE DISSECTION;  Surgeon: Jovita Kussmaul, MD;  Location: [place];  Service: General;  Laterality: Right;  . BREAST RECONSTRUCTION WITH PLACEMENT OF TISSUE EXPANDER AND FLEX HD (ACELLULAR HYDRATED DERMIS) Right 09/08/2019   Procedure: BREAST RECONSTRUCTION WITH PLACEMENT OF TISSUE EXPANDER AND FLEX HD (ACELLULAR HYDRATED DERMIS);  Surgeon: Wallace Going, DO;  Location: [place];  Service: Plastics;  Laterality: Right;  . BREAST REDUCTION WITH MASTOPEXY Left 01/02/2020   Procedure: MASTOPEXY / REDUCTION FOR  SYMMETRY;  Surgeon: Wallace Going, DO;  Location: [place];  Service: Plastics;  Laterality: Left;  . CESAREAN SECTION  1989  . COLONOSCOPY  2019  . HYSTEROSCOPY WITH D & C N/A 09/17/2018   Procedure: DILATATION AND CURETTAGE Marlene Bast;  Surgeon: Brien Few, MD;  Location: [place];  Service: Gynecology;  Laterality: N/A;  . PORT-A-CATH REMOVAL Left 01/02/2020   Procedure: REMOVAL PORT-A-CATH;  Surgeon: Wallace Going, DO;  Location: [place];  Service: Plastics;  Laterality: Left;  . PORTACATH PLACEMENT Left 08/01/2019   Procedure: INSERTION PORT-A-CATH;  Surgeon: Jovita Kussmaul, MD;  Location: [place];  Service: General;  Laterality: Left;  . RE-EXCISION OF BREAST CANCER,SUPERIOR MARGINS Right 08/12/2019   Procedure: RE-EXCISION OF RIGHT BREAST,SUPERIOR MARGINS;  Surgeon: Jovita Kussmaul, MD;  Location: [place];  Service: General;  Laterality: Right;  . RE-EXCISION OF BREAST LUMPECTOMY Right 08/01/2019   Procedure: RE-EXCISION OF RIGHT BREAST CANCER  MARGINS;  Surgeon: Jovita Kussmaul, MD;  Location: [place];  Service: General;  Laterality: Right;  . REMOVAL OF TISSUE EXPANDER AND PLACEMENT OF IMPLANT Right 01/02/2020   Procedure: REMOVAL OF TISSUE EXPANDER AND PLACEMENT OF IMPLANT;  Surgeon: Wallace Going, DO;  Location: [place];  Service: Plastics;  Laterality: Right;  . SIMPLE MASTECTOMY WITH AXILLARY SENTINEL NODE BIOPSY Right 09/08/2019   Procedure: RIGHT BREAST MASTECTOMY;  Surgeon: Jovita Kussmaul, MD;  Location: [place];  Service: General;  Laterality: Right;  . TONSILLECTOMY     age 65 years old  . WRIST FRACTURE SURGERY  2006    There were no vitals filed for this visit.   Subjective Assessment - 11/23/20 1107    Subjective Pt said the the Flexitouch was delivered today . She got good results from the trial. She  hopes to be able to set it up herself but will call for a trainer if needed    Pertinent History Rt lumpectomy grade 3 DCIS on 06/27/19 with Dr. [name] and Re-excision on 08/12/19, then right mastectomy with immediate expander placement and 8 lymph nodes removed Dr. Marla Roe for Plastics.  Recieved  chemotherapy of Cytoxan and Taxotere and had radiation.  Right implant was in June 2021    Currently in Pain? Yes    Pain Score 2    tightness under the arm   Pain Location Breast                     Flowsheet Row Outpatient Rehab from 10/21/2019 in [place]  Lymphedema Life Impact Scale Total Score 7.35 %            [facility] Adult PT Treatment/Exercise - 11/23/20 0001      Exercises   Other Exercises  sitting neck and scapular ROM . 15 reps of trunk rotation to left with slight increase in right arm elevation      Manual Therapy   Soft tissue mobilization in sidelying and with with cocoa butter to right upper traps, scapular and posterior shoulder mucles.  Attempted to do pec major, but pt still with marked tenderness to this area. Pt also with tightness in deep right neck muscles.    Myofascial Release with prolonged pressure to trigger points  also with crossed hands to skin on anterior chest and around implant    Passive ROM to right shoulder.  Pt with decreased external rotation with severe tightness at pec major                    PT Short Term Goals - 10/24/20 1618      PT SHORT TERM GOAL #1   Title Pt will have a good  fitting compression sleeve for right arm    Status Achieved      PT SHORT TERM GOAL #2   Title She will report pain decr 25% decrease in raising her arm overhead    Time 4    Period Weeks    Status On-going      PT SHORT TERM GOAL #3   Title Pt will be independent in a basic HEP for upper body stretching    Period Weeks    Status On-going      PT SHORT TERM GOAL #4   Title Pt will decrease Quick  DASH score to < 20 indicating an improvement in right UE function    Baseline 31.82 on 10/05/2020    Time 8    Status On-going             PT Long Term Goals - 10/24/20 1618      PT LONG TERM GOAL #1   Title Pt will increase Rt shoulder AROM to at least: 150 degrees of right shoulder flexion and abuction to increase mobility    Baseline 10/05/2020 120 flex, 115 abd    Period Weeks  Status On-going      PT LONG TERM GOAL #2   Title Pt will report the pain in her right upper quadrant is decreased by 50%    Time 8    Period Weeks    Status On-going      PT LONG TERM GOAL #3   Title Pt will be independent in HEP for ROM, strength and fitness that she reports she can continue at home    Time 8    Period Weeks    Status On-going      PT LONG TERM GOAL #4   Title Pt will decrease Quick DASH score to < 20 indicating an improvement in function of RUE    Baseline 31.82 on 10/05/2020    Time 8    Period Weeks    Status On-going                 Plan - 11/23/20 1207    Clinical Impression Statement Pt continues with tight in her right upper quadant including breast and scapular area that is sometimes tighter than others. She feels very tight today.  She did tolerate manual work and stretches and felt better at end of session. Pt does not want to do dry needling, but is hopeful the Flexitouch will help her.    Personal Factors and Comorbidities Comorbidity 3+    Comorbidities lymph node removal, chemotherapy, radiation.    Examination-Activity Limitations Reach Overhead;Caring for Others;Carry;Lift;Hygiene/Grooming;Sleep    Examination-Participation Restrictions Yard Work;Cleaning;Laundry;Meal Prep;Shop    Stability/Clinical Decision Making Evolving/Moderate complexity    Rehab Potential Good    PT Frequency 2x / week    PT Duration 8 weeks    PT Treatment/Interventions ADLs/Self Care Home Management;DME Instruction;Therapeutic exercise;Patient/family education;Manual  techniques;Manual lymph drainage;Compression bandaging;Passive range of motion;Therapeutic activities;Neuromuscular re-education;Orthotic Fit/Training;Scar mobilization;Dry needling;Taping    PT Next Visit Plan continue stretching and MLD and manual work  to right upper quadrant, consider aquatics at end of session ??    Consulted and Agree with Plan of Care Patient           Patient will benefit from skilled therapeutic intervention in order to improve the following deficits and impairments:  Pain,Decreased scar mobility,Postural dysfunction,Decreased range of motion,Increased edema,Decreased skin integrity,Increased muscle spasms,Decreased activity tolerance,Decreased strength,Increased fascial restricitons,Impaired flexibility,Impaired UE functional use  Visit Diagnosis: Disorder of the skin and subcutaneous tissue related to radiation, unspecified  Aftercare following surgery for neoplasm  Stiffness of right shoulder, not elsewhere classified  Lymphedema  Chronic right shoulder pain     Problem List Patient Active Problem List   Diagnosis Date Noted  . S/P breast reconstruction, right 01/23/2020  . History of reduction surgery of left breast 01/23/2020  . Port-A-Cath in place 10/20/2019  . S/P mastectomy, right 10/19/2019  . Acquired absence of breast 09/16/2019  . Breast cancer ([place]) 09/08/2019  . Genetic testing 06/10/2019  . Malignant neoplasm of upper-outer quadrant of right breast in female, estrogen receptor positive ([place]) 06/01/2019  . Family history of breast cancer 06/01/2019  . Family history of colon cancer 06/01/2019  . Family history of brain cancer 06/01/2019  . History of colon cancer   . Abnormal colonoscopy 10/30/2017  . Chronic glaucoma 08/11/2013  . HELICOBACTER PYLORI INFECTION 08/27/2007  . GERD 08/27/2007  . ESOPHAGITIS, REFLUX 05/17/2007  . HEMORRHOIDS, INTERNAL 09/22/2003   Donato Heinz. Owens Shark, PT  [name] 11/23/2020, 12:10  PM  [place], [place],  [address] Phone: [phone]   Fax:  [phone]  Name: Wendy [name] MRN: [id number] Date of Birth: [birth year]

## 2020-11-30 ENCOUNTER — Ambulatory Visit: Payer: No Typology Code available for payment source | Admitting: Surgical

## 2020-11-30 ENCOUNTER — Ambulatory Visit (INDEPENDENT_AMBULATORY_CARE_PROVIDER_SITE_OTHER): Payer: No Typology Code available for payment source | Admitting: Surgical

## 2020-11-30 ENCOUNTER — Other Ambulatory Visit: Payer: Self-pay

## 2020-11-30 ENCOUNTER — Encounter: Payer: Self-pay | Admitting: Surgical

## 2020-11-30 DIAGNOSIS — C50411 Malignant neoplasm of upper-outer quadrant of right female breast: Secondary | ICD-10-CM | POA: Diagnosis not present

## 2020-11-30 DIAGNOSIS — Z170 Estrogen receptor positive status [ER+]: Secondary | ICD-10-CM | POA: Diagnosis not present

## 2020-11-30 DIAGNOSIS — Z923 Personal history of irradiation: Secondary | ICD-10-CM | POA: Diagnosis not present

## 2020-11-30 DIAGNOSIS — Z9011 Acquired absence of right breast and nipple: Secondary | ICD-10-CM | POA: Diagnosis not present

## 2020-11-30 NOTE — Progress Notes (Signed)
   Referring Provider [name], MD [address] [place],  [place] [address]   CC:  Chief Complaint  Patient presents with  . Follow-up      Wendy [name] is an 65 y.o. female.  HPI: Patient is a 65 year old female here for follow-up after breast reconstruction.  She underwent removal of her right breast tissue expander and placement of a right breast implant and left breast mastopexy/reduction for symmetry with Dr. Marla Roe on 01/02/2020.  She subsequently had radiation and completed this on 03/16/2020.  She also has history of chemotherapy which was completed on 12/15/2019.  She was last seen in the office approximately 2 months ago and was having some pain in her right breast.  We had set her up for physical therapy to see if this was helpful.  She reports today that she has noticed minimal improvement after physical therapy.  She reports that the therapy is helpful when she is receiving the therapy but after each visit she returns with the same amount of tightness and firmness and pressure in her right breast.  She has pain daily at night and it is very bothersome for her.  She is interested in possible options to help relieve her daily pain.  Review of Systems General: No fevers or chills  Physical Exam Vitals with BMI 10/25/2020 09/21/2020 09/20/2020  Height 5\' 3"  - 5\' 3"   Weight 206 lbs 13 oz - 209 lbs  BMI 03.21 - 22.48  Systolic 250 037 048  Diastolic 78 75 62  Pulse 70 79 74    General:  No acute distress,  Alert and oriented, Non-Toxic, Normal speech and affect Bilateral breast incisions are intact and well-healed.  Right breast is very firm and there is some question for capsular contracture versus skin tightening from radiation.  There is some overlying skin changes consistent with radiation damage to the skin.  There is no cellulitic changes or erythema.  The right pectoralis muscle is very firm and tight.  She has decreased range of motion.  No wounds  noted.  Assessment/Plan  Patient is a 65 year old female approximately 1 year postop from right breast reconstruction with placement of a right breast implant and left breast mastopexy.  She had radiation therapy.  She also had chemotherapy.  The right breast is very tight and firm.  Physical therapy has been minimally helpful and she is having daily pain.  We discussed options today for further management which include removal of the implant without replacement, removal of implant and replacement with a smaller implant, latissimus flap, TRAM flap or complete removal of the implant and use of a prosthesis.  Dr. Marla Roe was also present during today's discussion.   We discussed the use of the prosthesis if implant were to be removed.  Would like patient to schedule an appointment at [facility] to discuss options for prosthesis prior to her making any decisions to be sure that she would be comfortable with this decision if she decides to go that route.  After the discussion patient did not seem very interested in the latissimus flap option.  We will schedule a telemetry visit in 2 weeks with Dr. Marla Roe to further discuss.  Recommend calling with questions or concerns.    Wendy [name] 11/30/2020, 2:25 PM

## 2020-12-04 ENCOUNTER — Other Ambulatory Visit: Payer: Self-pay

## 2020-12-04 ENCOUNTER — Ambulatory Visit: Payer: No Typology Code available for payment source | Admitting: Audiologist

## 2020-12-04 DIAGNOSIS — L599 Disorder of the skin and subcutaneous tissue related to radiation, unspecified: Secondary | ICD-10-CM | POA: Diagnosis not present

## 2020-12-04 DIAGNOSIS — H9313 Tinnitus, bilateral: Secondary | ICD-10-CM

## 2020-12-04 DIAGNOSIS — H9193 Unspecified hearing loss, bilateral: Secondary | ICD-10-CM

## 2020-12-04 NOTE — Procedures (Signed)
  Outpatient [facility] and [facility] [place], [place] [employer]  [address] [phone]  AUDIOLOGICAL  EVALUATION  NAME: Wendy [name]     DOB:   [birth year]      MRN: [id number]                                                                                     DATE: 12/04/2020     REFERENT: [name], MD STATUS: Outpatient DIAGNOSIS: Tinnitus, Decreased Hearing    History: [name] was seen for an audiological evaluation. [name] is receiving a hearing evaluation due to concerns for ringing in her hears and difficulty understanding people. [name] has difficulty hearing the end of what people say. This difficulty began gradually. No pain or pressure reported in either ear. Tinnitus present in both ears for several years. For the last few months the tinnitus has been getting worse, especially in quiet and at night. [name] has a history of chemotherapy and radiation which are risk factors for hearing loss. She has never had ear infections and there is no family history of hearing loss. No other relevant case history reported.   Evaluation:   Otoscopy showed a clear view of the tympanic membranes, bilaterally  Tympanometry results were consistent with normal middle ear function, bilaterally    Audiometric testing was completed using conventional audiometry with insert and high frequency supraural transducer. Speech Recognition Thresholds were consistent with pure tone averages. Word Recognition was excellent at 40dB SL (65dB, which is a loud conversational level). Pure tone thresholds show normal to slight hearing loss in both ears. Test results are consistent with borderline normal hearing 250-8kHz bilaterally.   Tinnitus pitch and loudness matched to a 2k Hz pure tone at 38dB, which is about 10-15dB SL.   Results:  The test results were reviewed with [name]. Her hearing tests results were printed and explained. [name] has borderline normal hearing to  a slight hearing loss 250-8k Hz in both ears. Her tinnitus is a mid pitch that can easily be covered by a loft level of masking sound. The appropriate use of a masker was explained. Hearing aids are note necessary at this time.   Recommendations: 1. Use of Bose Sleep Buds or the MyNoise App is recommended to mask tinnitus when needed.  o Use of masker should be at night and when in quiet environments where the tinnitus is loud or when around triggering sound. Masker should be played at lowest level possible that provides relief from tinnitus.  o If ear buds are uncomfortable then recommend a white noise pillow. These recommendations were written out for [facility].   Alfonse Alpers  Audiologist, Au.D., CCC-A 12/04/2020  12:42 PM  Cc: [name], MD

## 2020-12-05 ENCOUNTER — Other Ambulatory Visit: Payer: No Typology Code available for payment source

## 2020-12-06 ENCOUNTER — Other Ambulatory Visit: Payer: Self-pay | Admitting: Hematology and Oncology

## 2020-12-19 ENCOUNTER — Other Ambulatory Visit: Payer: Self-pay | Admitting: Hematology and Oncology

## 2020-12-20 ENCOUNTER — Encounter: Payer: Self-pay | Admitting: Hematology and Oncology

## 2021-01-01 ENCOUNTER — Ambulatory Visit: Payer: No Typology Code available for payment source | Attending: Internal Medicine

## 2021-01-01 ENCOUNTER — Other Ambulatory Visit (HOSPITAL_BASED_OUTPATIENT_CLINIC_OR_DEPARTMENT_OTHER): Payer: Self-pay

## 2021-01-01 ENCOUNTER — Encounter: Payer: Self-pay | Admitting: Hematology and Oncology

## 2021-01-01 ENCOUNTER — Other Ambulatory Visit: Payer: Self-pay

## 2021-01-01 DIAGNOSIS — Z23 Encounter for immunization: Secondary | ICD-10-CM

## 2021-01-01 MED ORDER — PFIZER-BIONT COVID-19 VAC-TRIS 30 MCG/0.3ML IM SUSP
INTRAMUSCULAR | 0 refills | Status: DC
  Filled 2021-01-01: qty 0.3, 1d supply, fill #0

## 2021-01-01 NOTE — Progress Notes (Signed)
   Covid-19 Vaccination Clinic  Name:  AFIA MESSENGER    MRN: [id number] DOB: 1955-10-14  01/01/2021  Ms. Pavlovic was observed post Covid-19 immunization for 15 minutes without incident. She was provided with Vaccine Information Sheet and instruction to access the V-Safe system.   Ms. Villafuerte was instructed to call 911 with any severe reactions post vaccine: Difficulty breathing  Swelling of face and throat  A fast heartbeat  A bad rash all over body  Dizziness and weakness   Immunizations Administered     Name Date Dose VIS Date Route   PFIZER Comrnaty(Gray TOP) Covid-19 Vaccine 01/01/2021 11:57 AM 0.3 mL 06/14/2020 Intramuscular   Manufacturer: [place]   Lot: [id number]   [place]: [phone]

## 2021-01-04 ENCOUNTER — Telehealth: Payer: No Typology Code available for payment source | Admitting: Plastic Surgery

## 2021-02-27 ENCOUNTER — Telehealth: Payer: Self-pay

## 2021-02-27 NOTE — Telephone Encounter (Signed)
Patient called to say that the implant on her right breast is painful and tender above her ribcage.  She said it seems like it's getting worse.  Please call.

## 2021-02-27 NOTE — Telephone Encounter (Signed)
Called pt, left detailed message and offered to move f/u in December up or televisit with Dr. Marla Roe to discuss the options that were given to her at her last ov in May 2022 with Matt, PA-C.

## 2021-02-28 ENCOUNTER — Other Ambulatory Visit: Payer: Self-pay | Admitting: Adult Health

## 2021-03-01 ENCOUNTER — Encounter: Payer: Self-pay | Admitting: Hematology and Oncology

## 2021-03-03 NOTE — Progress Notes (Signed)
Patient Care Team: [name], MD as PCP - General (Family Medicine) Jovita Kussmaul, MD as Consulting Physician (General Surgery) Kyung Rudd, MD as Consulting Physician (Radiation Oncology) Nicholas Lose, MD as Consulting Physician (Hematology and Oncology)  DIAGNOSIS:    ICD-10-CM   1. Malignant neoplasm of upper-outer quadrant of right breast in female, estrogen receptor positive ([place])  C50.411    Z17.0       SUMMARY OF ONCOLOGIC HISTORY: Oncology History  Malignant neoplasm of upper-outer quadrant of right breast in female, estrogen receptor positive ([place])  05/20/2019 Initial Diagnosis   Palpable right breast mass superficial, ultrasound 11 o'clock position 1.7 cm: Biopsy revealed grade 3 IDC with DCIS ER 100%, PR 30%, Ki-67 20%, HER-2 by IHC negative, axilla lymph node biopsy positive   06/01/2019 Cancer Staging   Staging form: Breast, AJCC 8th Edition - Clinical stage from 06/01/2019: Stage IIA (cT1c, cN1, cM0, G3, ER+, PR+, HER2-)   06/27/2019 Surgery   Right lumpectomy [name]) [phone]): IDC, grade 3, at least 2.5cm, involving a complex sclerosing lesion, 1.1cm, with high grade DCIS, involved margins, 1/3 right axillary lymph nodes positive, 1.4cm.    07/11/2019 Cancer Staging   Staging form: Breast, AJCC 8th Edition - Pathologic stage from 07/11/2019: Stage IIA (pT2, pN1a, cM0, G3, ER+, PR+, HER2-)   07/19/2019 Oncotype testing   Mammaprint: high risk, 29% chance of recurrence in 10 years without systemic treatment.    09/08/2019 Surgery   Re-excision x2 (08/01/19 and 08/12/19) [phone] and [id number])   Right mastectomy [name]) [phone]): residual carcinoma and DCIS, 1/5 lymph nodes positive, clear margins.    10/14/2019 - 12/15/2019 Chemotherapy   dexamethasone (DECADRON) 4 MG tablet, 4 mg (100 % of original dose 4 mg), Oral, Daily, 1 of 1 cycle, Start date: 07/22/2019, End date: 11/24/2019. Dose modification: 4 mg (original dose 4 mg, Cycle  0).  palonosetron (ALOXI) injection 0.25 mg, 0.25 mg, Intravenous,  Once, 4 of 4 cycles. Administration: 0.25 mg (10/14/2019), 0.25 mg (11/04/2019), 0.25 mg (11/24/2019), 0.25 mg (12/15/2019).  pegfilgrastim (NEULASTA ONPRO KIT) injection 6 mg, 6 mg, Subcutaneous, Once, 4 of 4 cycles. Administration: 6 mg (10/14/2019), 6 mg (11/04/2019), 6 mg (11/24/2019), 6 mg (12/15/2019).  cyclophosphamide (CYTOXAN) 1,260 mg in sodium chloride 0.9 % 250 mL chemo infusion, 600 mg/m2 = 1,260 mg, Intravenous,  Once, 4 of 4 cycles. Dose modification: 500 mg/m2 (original dose 600 mg/m2, Cycle 2, Reason: Dose not tolerated). Administration: 1,260 mg (10/14/2019), 1,040 mg (11/04/2019), 1,040 mg (11/24/2019), 1,040 mg (12/15/2019).  DOCEtaxel (TAXOTERE) 160 mg in sodium chloride 0.9 % 250 mL chemo infusion, 75 mg/m2 = 160 mg, Intravenous,  Once, 4 of 4 cycles. Dose modification: 65 mg/m2 (original dose 75 mg/m2, Cycle 2, Reason: Dose not tolerated). Administration: 160 mg (10/14/2019), 140 mg (11/04/2019), 140 mg (11/24/2019), 140 mg (12/15/2019).   01/31/2020 - 03/16/2020 Radiation Therapy   The patient initially received a dose of 50.4 Gy in 28 fractions to the chest wall and supraclavicular region. This was delivered using a 3-D conformal, 4 field technique. The patient then received a boost to the mastectomy scar. This delivered an additional 10 Gy in 5 fractions using an en face electron field. The total dose was 60.4 Gy.   03/2020 - 03/2027 Anti-estrogen oral therapy   Anastrozole     CHIEF COMPLIANT: Follow-up of right breast cancer on anastrozole  INTERVAL HISTORY: Wendy [name] is a 65 y.o. with above-mentioned history of right breast cancer who underwent a lumpectomy followed  by re-excision and then a mastectomy, adjuvant chemotherapy, radiation, and antiestrogen therapy with anastrozole (stopped on 07/30/20). She presents to the clinic today for follow-up.  She continues to have severe pain and discomfort in the breast implant.   There was a contracture of the implant and because of that she will need to undergo another surgery to remove the implant.  She is debating about whether or not she wants to do a D IEP flap reconstruction or lift without any breast reconstruction. Regarding peripheral neuropathy she rates it as 5-6 out of 10.  It is interfering with her quality of life and making it difficult for her to function.  She has applied for disability but they have not come back to her.  She is struggling but she is continuing to work currently.  ALLERGIES:  is allergic to hydrocodone-acetaminophen, oxycodone, other, vicodin [hydrocodone-acetaminophen], and brimonidine.  MEDICATIONS:  Current Outpatient Medications  Medication Sig Dispense Refill   COVID-19 mRNA Vac-TriS, Pfizer, (PFIZER-BIONT COVID-19 VAC-TRIS) SUSP injection Inject into the muscle. 0.3 mL 0   dorzolamide-timolol (COSOPT) 22.3-6.8 MG/ML ophthalmic solution PLACE 1 DROP INTO BOTH EYES 2 (TWO) TIMES DAILY.     furosemide (LASIX) 20 MG tablet TAKE 1 TABLET BY MOUTH TWICE A DAY 180 tablet 1   gabapentin (NEURONTIN) 300 MG capsule TAKE 1 CAPSULE BY MOUTH twice daily 180 capsule 3   latanoprost (XALATAN) 0.005 % ophthalmic solution 1 drop at bedtime.      meloxicam (MOBIC) 7.5 MG tablet Take 1 tablet (7.5 mg total) by mouth daily as needed for pain. 30 tablet 0   triamcinolone ointment (KENALOG) 0.5 % APPLY TO AFFECTED AREA TWICE A DAY 30 g 0   venlafaxine XR (EFFEXOR-XR) 75 MG 24 hr capsule Take 1 capsule (75 mg total) by mouth daily with breakfast. 90 capsule 1   No current facility-administered medications for this visit.   Facility-Administered Medications Ordered in Other Visits  Medication Dose Route Frequency Provider Last Rate Last Admin   heparin lock flush 100 unit/mL  500 Units Intravenous Once Nicholas Lose, MD       sodium chloride flush (NS) 0.9 % injection 10 mL  10 mL Intravenous PRN Nicholas Lose, MD   10 mL at 10/14/19 1032     PHYSICAL EXAMINATION: ECOG PERFORMANCE STATUS: 2 - Symptomatic, <50% confined to bed  Vitals:   03/04/21 1153  BP: (!) 145/78  Pulse: 76  Resp: 18  Temp: 97.9 F (36.6 C)  SpO2: 99%   Filed Weights   03/04/21 1153  Weight: 215 lb 1.6 oz (97.6 kg)      LABORATORY DATA:  I have reviewed the data as listed CMP Latest Ref Rng & Units 03/27/2020 01/18/2020 12/15/2019  Glucose 70 - 99 mg/dL 90 95 121(H)  BUN 8 - 23 mg/dL 10 10 7(L)  Creatinine 0.44 - 1.00 mg/dL 0.67 0.69 0.65  Sodium 135 - 145 mmol/L 139 144 140  Potassium 3.5 - 5.1 mmol/L 4.2 4.0 3.7  Chloride 98 - 111 mmol/L 103 111 111  CO2 22 - 32 mmol/L '29 24 23  ' Calcium 8.9 - 10.3 mg/dL 9.4 8.8(L) 8.6(L)  Total Protein 6.5 - 8.1 g/dL 7.4 6.1(L) 5.8(L)  Total Bilirubin 0.3 - 1.2 mg/dL 0.5 0.7 0.5  Alkaline Phos 38 - 126 U/L 87 56 58  AST 15 - 41 U/L '19 19 16  ' ALT 0 - 44 U/L '10 11 11    ' Lab Results  Component Value Date   WBC 4.3  03/27/2020   HGB 14.2 03/27/2020   HCT 44.8 03/27/2020   MCV 87.2 03/27/2020   PLT 120 (L) 03/27/2020   NEUTROABS 2.1 03/27/2020    ASSESSMENT & PLAN:  Malignant neoplasm of upper-outer quadrant of right breast in female, estrogen receptor positive ([place]) 06/27/2019: Right lumpectomy [name]): IDC, grade 3, at least 2.5cm, involving a complex sclerosing lesion, 1.1cm, with high grade DCIS, involved margins, 1/3 right axillary lymph nodes positive, 1.4cm.  ER 100%, PR 30%, Ki-67 20%, HER-2 IHC negative T2 N1 stage IIA MammaPrint: High risk, 10-year risk of recurrence untreated: 29% 09/08/2019:Re-excision x2 (08/01/19 and 08/12/19) followed by right mastectomy [name]): residual carcinoma and DCIS, 1/5 lymph nodes positive, clear margins.    Treatment plan: 1. Adjuvant chemotherapy with Taxotere and Cytoxan every 3 weeks x4 completed 12/15/2019 2.  Adjuvant radiation therapy completed 03/16/2020 3.  Follow-up adjuvant antiestrogen therapy with anastrozole 1 mg daily x7 years Genetic  testing ------------------------------------------------------------------------------------------------------ Treatment plan: Plan for adjuvant antiestrogen therapy with anastrozole x7 years started September 2021   Bilateral lower extremity swelling: 01/26/2020: Echocardiogram EF 60 to 65% 01/21/2020: Ultrasound lower extremity: No DVT Continue with Lasix as needed   Chemo-induced peripheral neuropathy: Encouraged her to take gabapentin at bedtime Anastrozole Toxicities: Hot flashes.  She stopped Effexor because she did not think that it was very helpful but she will restart it.  I instructed her not to take black cohosh.   Severe pain in the reconstructed right breast: Capsular contracture.  She sees Dr. Marla Roe.  She is debating between removing the implant versus DIEP flap.   Breast Cancer Surveillance: Mammogram:  At Dr. Kennith Maes office Bone density: 09/12/2020: T score -1.2: Mild osteopenia: Calcium and vitamin D   I recommended participation in the phase 2 [facility] [place] 2102 study of N-Palmitoylrthanolamide vs placebo for the treatment of chemo induced peripheral neuropathy.     No orders of the defined types were placed in this encounter.  The patient has a good understanding of the overall plan. she agrees with it. she will call with any problems that may develop before the next visit here.  Total time spent: 30 mins including face to face time and time spent for planning, charting and coordination of care  Rulon Eisenmenger, MD, MPH 03/04/2021  I, Thana Ates, am acting as scribe for Dr. Nicholas Lose.  I have reviewed the above documentation for accuracy and completeness, and I agree with the above.

## 2021-03-04 ENCOUNTER — Inpatient Hospital Stay: Payer: No Typology Code available for payment source | Admitting: Emergency Medicine

## 2021-03-04 ENCOUNTER — Other Ambulatory Visit: Payer: Self-pay

## 2021-03-04 ENCOUNTER — Inpatient Hospital Stay
Payer: No Typology Code available for payment source | Attending: Hematology and Oncology | Admitting: Hematology and Oncology

## 2021-03-04 DIAGNOSIS — Z170 Estrogen receptor positive status [ER+]: Secondary | ICD-10-CM | POA: Diagnosis not present

## 2021-03-04 DIAGNOSIS — Z79811 Long term (current) use of aromatase inhibitors: Secondary | ICD-10-CM | POA: Insufficient documentation

## 2021-03-04 DIAGNOSIS — C50411 Malignant neoplasm of upper-outer quadrant of right female breast: Secondary | ICD-10-CM

## 2021-03-04 DIAGNOSIS — N6489 Other specified disorders of breast: Secondary | ICD-10-CM | POA: Diagnosis not present

## 2021-03-04 DIAGNOSIS — G629 Polyneuropathy, unspecified: Secondary | ICD-10-CM | POA: Insufficient documentation

## 2021-03-04 DIAGNOSIS — Z885 Allergy status to narcotic agent status: Secondary | ICD-10-CM | POA: Insufficient documentation

## 2021-03-04 DIAGNOSIS — M8580 Other specified disorders of bone density and structure, unspecified site: Secondary | ICD-10-CM | POA: Insufficient documentation

## 2021-03-04 DIAGNOSIS — Z79899 Other long term (current) drug therapy: Secondary | ICD-10-CM | POA: Insufficient documentation

## 2021-03-04 DIAGNOSIS — M7989 Other specified soft tissue disorders: Secondary | ICD-10-CM | POA: Insufficient documentation

## 2021-03-04 DIAGNOSIS — Z9011 Acquired absence of right breast and nipple: Secondary | ICD-10-CM | POA: Insufficient documentation

## 2021-03-04 NOTE — Research (Signed)
ACCRU--2102 - TREATMENT OF ESTABLISHED CHEMOTHERAPY-INDUCED NEUROPATHY WITH N-PALMITOYLETHANOLAMIDE, A CANNABIMIMETIC NUTRACEUTICAL: A RANDOMIZED DOUBLE-BLIND PHASE II PILOT TRIAL  Patient Wendy [name] was identified by MD Lindi Adie as a potential candidate for the above listed study.  This Clinical Research Nurse met with KAYSIA WILLARD, [id number], on 03/04/21 in a manner and location that ensures patient privacy to discuss participation in the above listed research study.  Patient is Accompanied by S.O.  Jenny Reichmann .  A copy of the informed consent document with embedded HIPAA language was provided to the patient.  Patient reads, speaks, and understands [place].   Patient was provided with the business card of this Nurse and encouraged to contact the research team with any questions.  Approximately 20 minutes were spent with the patient reviewing the informed consent documents.  Patient was provided the option of taking informed consent documents home to review and was encouraged to review at their convenience with their support network, including other care providers.  Patient declined to participate in study at this time, instead will acquire study drug commercially on her own to determine if supplement works for her (does not wish to participate due to potential receipt of placebo while on study).  Pt denies any further questions/concerns at this time.  Wells Guiles '[place], RN, BSN Clinical Research Nurse I 03/04/21 1:23 PM

## 2021-03-04 NOTE — Assessment & Plan Note (Signed)
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  Treatment plan: 1.Adjuvant chemotherapy withTaxotere and Cytoxan every 3 weeks x4completed 12/15/2019 2.Adjuvant radiation therapycompleted 03/16/2020 3.Follow-up adjuvant antiestrogen therapywith anastrozole 1 mg daily x7 years Genetic testing ------------------------------------------------------------------------------------------------------ Treatment plan: Plan for adjuvant antiestrogen therapy with anastrozole x7 years started September 2021  Bilateral lower extremity swelling: 01/26/2020: Echocardiogram EF 60 to 65% 01/21/2020: Ultrasound lower extremity: No DVT Continue with Lasixas needed  Chemo-induced peripheral neuropathy:Encouraged her to take gabapentin at bedtime Anastrozole Toxicities: Hot flashes are improved with Effexor Slight blurred vision: Unclear etiology, she discontinued anastrozole but the symptoms have not fully resolved.  I instructed her to see ophthalmology first.  If that exam is fine she will try to restart anastrozole again.  Severe pain in the reconstructed right breast: I suspect it is the radiation which has caused significant contracture.  She has appointment to see Dr. Marla Roe.  I recommended that we add gabapentin to the morning as well.  Breast Cancer Surveillance: 1. Breast Exam: 03/04/2021: Benign 2. Mammogram:  At Dr. Modesto Charon office Bone density: 09/12/2020: T score -1.2: Mild osteopenia: Calcium and vitamin D  RTC in 1 year

## 2021-03-25 ENCOUNTER — Ambulatory Visit: Payer: No Typology Code available for payment source | Admitting: Hematology and Oncology

## 2021-04-17 ENCOUNTER — Other Ambulatory Visit: Payer: Self-pay | Admitting: Hematology and Oncology

## 2021-04-28 ENCOUNTER — Emergency Department (HOSPITAL_BASED_OUTPATIENT_CLINIC_OR_DEPARTMENT_OTHER)
Admission: EM | Admit: 2021-04-28 | Discharge: 2021-04-28 | Disposition: A | Discharge status: Home / Self Care | Payer: No Typology Code available for payment source | Attending: Emergency Medicine | Admitting: Emergency Medicine

## 2021-04-28 ENCOUNTER — Encounter (HOSPITAL_BASED_OUTPATIENT_CLINIC_OR_DEPARTMENT_OTHER): Payer: Self-pay

## 2021-04-28 ENCOUNTER — Other Ambulatory Visit: Payer: Self-pay

## 2021-04-28 ENCOUNTER — Emergency Department (HOSPITAL_BASED_OUTPATIENT_CLINIC_OR_DEPARTMENT_OTHER): Payer: No Typology Code available for payment source

## 2021-04-28 DIAGNOSIS — R079 Chest pain, unspecified: Secondary | ICD-10-CM | POA: Diagnosis present

## 2021-04-28 DIAGNOSIS — Z85038 Personal history of other malignant neoplasm of large intestine: Secondary | ICD-10-CM | POA: Diagnosis not present

## 2021-04-28 DIAGNOSIS — Z853 Personal history of malignant neoplasm of breast: Secondary | ICD-10-CM | POA: Insufficient documentation

## 2021-04-28 DIAGNOSIS — R0789 Other chest pain: Secondary | ICD-10-CM | POA: Diagnosis not present

## 2021-04-28 LAB — BASIC METABOLIC PANEL
Anion gap: 8 (ref 5–15)
BUN: 10 mg/dL (ref 8–23)
CO2: 26 mmol/L (ref 22–32)
Calcium: 9.2 mg/dL (ref 8.9–10.3)
Chloride: 105 mmol/L (ref 98–111)
Creatinine, Ser: 0.63 mg/dL (ref 0.44–1.00)
GFR, Estimated: >60 mL/min (ref >60)
Glucose, Bld: 89 mg/dL (ref 70–99)
Potassium: 3.8 mmol/L (ref 3.5–5.1)
Sodium: 139 mmol/L (ref 135–145)

## 2021-04-28 LAB — TROPONIN I (HIGH SENSITIVITY): Troponin I (High Sensitivity): 3 ng/L (ref <18)

## 2021-04-28 LAB — CBC WITH DIFFERENTIAL/PLATELET
Abs Immature Granulocytes: 0.01 10*3/uL (ref 0.00–0.07)
Basophils Absolute: 0 10*3/uL (ref 0.0–0.1)
Basophils Relative: 0 %
Eosinophils Absolute: 0 10*3/uL (ref 0.0–0.5)
Eosinophils Relative: 1 %
HCT: 43.7 % (ref 36.0–46.0)
Hemoglobin: 14.1 g/dL (ref 12.0–15.0)
Immature Granulocytes: 0 %
Lymphocytes Relative: 33 %
Lymphs Abs: 2.2 10*3/uL (ref 0.7–4.0)
MCH: 27.9 pg (ref 26.0–34.0)
MCHC: 32.3 g/dL (ref 30.0–36.0)
MCV: 86.5 fL (ref 80.0–100.0)
Monocytes Absolute: 0.5 10*3/uL (ref 0.1–1.0)
Monocytes Relative: 8 %
Neutro Abs: 3.7 10*3/uL (ref 1.7–7.7)
Neutrophils Relative %: 58 %
Platelets: 162 10*3/uL (ref 150–400)
RBC: 5.05 MIL/uL (ref 3.87–5.11)
RDW: 13.7 % (ref 11.5–15.5)
WBC: 6.5 10*3/uL (ref 4.0–10.5)
nRBC: 0 % (ref 0.0–0.2)

## 2021-04-28 LAB — D-DIMER, QUANTITATIVE: D-Dimer, Quant: 0.44 ug/mL-FEU (ref 0.00–0.50)

## 2021-04-28 MED ORDER — OXYCODONE HCL 5 MG PO TABS: 5.0000 mg | ORAL_TABLET | Freq: Once | ORAL | Status: DC

## 2021-04-28 MED ORDER — CYCLOBENZAPRINE HCL 10 MG PO TABS: 10.0000 mg | ORAL_TABLET | Freq: Two times a day (BID) | ORAL | 0 refills | Status: DC | PRN

## 2021-04-28 MED ORDER — KETOROLAC TROMETHAMINE 30 MG/ML IJ SOLN
30.0000 mg | Freq: Once | INTRAMUSCULAR | Status: AC
  Administered 2021-04-28: 30 mg via INTRAVENOUS
  Filled 2021-04-28: qty 1

## 2021-04-28 MED ORDER — CYCLOBENZAPRINE HCL 10 MG PO TABS
10.0000 mg | ORAL_TABLET | Freq: Once | ORAL | Status: AC
  Administered 2021-04-28: 10 mg via ORAL
  Filled 2021-04-28: qty 1

## 2021-04-28 MED ORDER — FENTANYL CITRATE PF 50 MCG/ML IJ SOSY
50.0000 ug | PREFILLED_SYRINGE | Freq: Once | INTRAMUSCULAR | Status: AC
  Administered 2021-04-28: 50 ug via INTRAVENOUS
  Filled 2021-04-28: qty 1

## 2021-04-28 MED ORDER — DIPHENHYDRAMINE HCL 25 MG PO CAPS: 25.0000 mg | ORAL_CAPSULE | Freq: Once | ORAL | Status: DC

## 2021-04-28 NOTE — ED Provider Notes (Signed)
[facility] EMERGENCY DEPT Provider Note   CSN: [id number] Arrival date & time: 04/28/21  1041     History Chief Complaint  Patient presents with   Chest Pain    Wendy [name] is a 65 y.o. female.  The history is provided by the patient.  Chest Pain Pain location:  L chest Pain quality: aching   Pain radiates to:  L arm Pain severity:  Mild Onset quality:  Gradual Duration:  5 days Timing:  Constant Progression since onset: waxing and waning. Chronicity:  New Context: lifting, movement and raising an arm   Relieved by:  Nothing Worsened by:  Movement Ineffective treatments: tramadol, ibuprofe, tylenol, lidocaine. Associated symptoms: no abdominal pain, no anorexia, no anxiety, no back pain, no claudication, no cough, no fever, no palpitations, no shortness of breath and no vomiting   Risk factors: high cholesterol   Risk factors: no coronary artery disease, no diabetes mellitus, no prior DVT/PE and no smoking   Risk factors comment:  Breast cancer in remission     Past Medical History:  Diagnosis Date   Breast cancer ([place])    Colon cancer ([place])    DDD (degenerative disc disease), cervical    DDD (degenerative disc disease), lumbar    ESOPHAGITIS, REFLUX    Glaucoma    HELICOBACTER PYLORI INFECTION    HEMORRHOIDS, INTERNAL    History of colon cancer    History of colon polyps 2019   Hyperlipidemia    Radius fracture 2006   Left, Distal   Thoracic spondylosis    Tinnitus, bilateral 2013    Patient Active Problem List   Diagnosis Date Noted   S/P breast reconstruction, right 01/23/2020   History of reduction surgery of left breast 01/23/2020   Port-A-Cath in place 10/20/2019   S/P mastectomy, right 10/19/2019   Acquired absence of breast 09/16/2019   Breast cancer ([place]) 09/08/2019   Genetic testing 06/10/2019   Malignant neoplasm of upper-outer quadrant of right breast in female, estrogen receptor positive ([place]) 06/01/2019   Family  history of breast cancer 06/01/2019   Family history of colon cancer 06/01/2019   Family history of brain cancer 06/01/2019   History of colon cancer    Abnormal colonoscopy 10/30/2017   Chronic glaucoma 32/95/1884   HELICOBACTER PYLORI INFECTION 08/27/2007   GERD 08/27/2007   ESOPHAGITIS, REFLUX 05/17/2007   HEMORRHOIDS, INTERNAL 09/22/2003    Past Surgical History:  Procedure Laterality Date   BREAST LUMPECTOMY WITH RADIOACTIVE SEED AND SENTINEL LYMPH NODE BIOPSY Right 06/27/2019   Procedure: RIGHT BREAST LUMPECTOMY WITH RADIOACTIVE SEED AND SENTINEL LYMPH NODE BIOPSY AND RIGHT TARGETED NODE DISSECTION;  Surgeon: Jovita Kussmaul, MD;  Location: [place];  Service: General;  Laterality: Right;   BREAST RECONSTRUCTION WITH PLACEMENT OF TISSUE EXPANDER AND FLEX HD (ACELLULAR HYDRATED DERMIS) Right 09/08/2019   Procedure: BREAST RECONSTRUCTION WITH PLACEMENT OF TISSUE EXPANDER AND FLEX HD (ACELLULAR HYDRATED DERMIS);  Surgeon: Wallace Going, DO;  Location: [place];  Service: Plastics;  Laterality: Right;   BREAST REDUCTION WITH MASTOPEXY Left 01/02/2020   Procedure: MASTOPEXY / REDUCTION FOR SYMMETRY;  Surgeon: Wallace Going, DO;  Location: [place];  Service: Plastics;  Laterality: Left;   CESAREAN SECTION  1989   COLONOSCOPY  2019   HYSTEROSCOPY WITH D & C N/A 09/17/2018   Procedure: DILATATION AND CURETTAGE Marlene Bast;  Surgeon: Brien Few, MD;  Location: [place];  Service: Gynecology;  Laterality:  N/A;   PORT-A-CATH REMOVAL Left 01/02/2020   Procedure: REMOVAL PORT-A-CATH;  Surgeon: Wallace Going, DO;  Location: [facility];  Service: Plastics;  Laterality: Left;   PORTACATH PLACEMENT Left 08/01/2019   Procedure: INSERTION PORT-A-CATH;  Surgeon: Jovita Kussmaul, MD;  Location: [place];  Service: General;  Laterality: Left;   RE-EXCISION OF BREAST  CANCER,SUPERIOR MARGINS Right 08/12/2019   Procedure: RE-EXCISION OF RIGHT BREAST,SUPERIOR MARGINS;  Surgeon: Jovita Kussmaul, MD;  Location: [place];  Service: General;  Laterality: Right;   RE-EXCISION OF BREAST LUMPECTOMY Right 08/01/2019   Procedure: RE-EXCISION OF RIGHT BREAST CANCER  MARGINS;  Surgeon: Jovita Kussmaul, MD;  Location: [place];  Service: General;  Laterality: Right;   REMOVAL OF TISSUE EXPANDER AND PLACEMENT OF IMPLANT Right 01/02/2020   Procedure: REMOVAL OF TISSUE EXPANDER AND PLACEMENT OF IMPLANT;  Surgeon: Wallace Going, DO;  Location: [place];  Service: Plastics;  Laterality: Right;   SIMPLE MASTECTOMY WITH AXILLARY SENTINEL NODE BIOPSY Right 09/08/2019   Procedure: RIGHT BREAST MASTECTOMY;  Surgeon: Jovita Kussmaul, MD;  Location: [place];  Service: General;  Laterality: Right;   TONSILLECTOMY     age 35 years old   WRIST FRACTURE SURGERY  2006     OB History   No obstetric history on file.     Family History  Problem Relation Age of Onset   Diabetes Mother    Brain cancer Mother        mets   Cancer Mother        breast-right   Breast cancer Mother        d. 19   Diabetes Father    Hypertension Father    Cancer Father        lung   Heart disease Father    Lung cancer Father    Diabetes Sister    Hypertension Sister    Glaucoma Sister    Diabetes Brother    Hypertension Brother    Heart disease Brother        congestive heart failure   Other Son 44       car accident   Colon cancer Maternal Grandmother 32       d. 52   Dementia Paternal Grandfather    Breast cancer Other        MGMs mother   Other Maternal Uncle        possibly drowning   Brain cancer Niece 1       d. 2 years    Social History   Tobacco Use   Smoking status: Never   Smokeless tobacco: Never  Substance Use Topics   Alcohol use: Yes    Comment: occ   Drug use: No    Home Medications Prior to  Admission medications   Medication Sig Start Date End Date Taking? Authorizing Provider  cyclobenzaprine (FLEXERIL) 10 MG tablet Take 1 tablet (10 mg total) by mouth 2 (two) times daily as needed for up to 20 doses for muscle spasms. 04/28/21  Yes Fenna Semel, DO  anastrozole (ARIMIDEX) 1 MG tablet TAKE 1 TABLET BY MOUTH EVERY DAY 04/17/21   Nicholas Lose, MD  COVID-19 mRNA Vac-TriS, Pfizer, (PFIZER-BIONT COVID-19 VAC-TRIS) SUSP injection Inject into the muscle. 01/01/21   Carlyle Basques, MD  dorzolamide-timolol (COSOPT) 22.3-6.8 MG/ML ophthalmic solution PLACE 1 DROP INTO BOTH EYES 2 (TWO) TIMES DAILY. 09/28/13   [provider]  furosemide (LASIX) 20 MG tablet TAKE 1 TABLET BY MOUTH TWICE A DAY 12/20/20   Nicholas Lose, MD  gabapentin (NEURONTIN) 300 MG capsule TAKE 1 CAPSULE BY MOUTH twice daily 09/20/20   Nicholas Lose, MD  latanoprost (XALATAN) 0.005 % ophthalmic solution 1 drop at bedtime.     [provider]  meloxicam (MOBIC) 7.5 MG tablet Take 1 tablet (7.5 mg total) by mouth daily as needed for pain. 09/10/20   [name], MD  triamcinolone ointment (KENALOG) 0.5 % APPLY TO AFFECTED AREA TWICE A DAY 03/01/21   Magrinat, Virgie Dad, MD  venlafaxine XR (EFFEXOR-XR) 75 MG 24 hr capsule Take 1 capsule (75 mg total) by mouth daily with breakfast. 11/12/20   Nicholas Lose, MD  prochlorperazine (COMPAZINE) 10 MG tablet Take 1 tablet (10 mg total) by mouth every 6 (six) hours as needed (Nausea or vomiting). Patient not taking: Reported on 01/10/2020 07/22/19 01/19/20  Nicholas Lose, MD    Allergies    Hydrocodone-acetaminophen, Oxycodone, Other, Vicodin [hydrocodone-acetaminophen], and Brimonidine  Review of Systems   Review of Systems  Constitutional:  Negative for chills and fever.  HENT:  Negative for ear pain and sore throat.   Eyes:  Negative for pain and visual disturbance.  Respiratory:  Negative for cough and shortness of breath.   Cardiovascular:  Positive for chest  pain. Negative for palpitations and claudication.  Gastrointestinal:  Negative for abdominal pain, anorexia and vomiting.  Genitourinary:  Negative for dysuria and hematuria.  Musculoskeletal:  Negative for arthralgias and back pain.  Skin:  Negative for color change and rash.  Neurological:  Negative for seizures and syncope.  All other systems reviewed and are negative.  Physical Exam Updated Vital Signs BP (!) 152/89 (BP Location: Right Arm)   Pulse 84   Temp 98.5 F (36.9 C) (Oral)   Resp 18   SpO2 100%   Physical Exam Vitals and nursing note reviewed.  Constitutional:      General: She is not in acute distress.    Appearance: She is well-developed.  HENT:     Head: Normocephalic and atraumatic.  Eyes:     Conjunctiva/sclera: Conjunctivae normal.  Cardiovascular:     Rate and Rhythm: Normal rate and regular rhythm.     Pulses:          Radial pulses are 2+ on the right side and 2+ on the left side.     Heart sounds: No murmur heard. Pulmonary:     Effort: Pulmonary effort is normal. No respiratory distress.     Breath sounds: Normal breath sounds. No decreased breath sounds, wheezing or rhonchi.  Chest:     Chest wall: Tenderness (TTP across left side of chest wall/shoulder/bicep) present.  Abdominal:     Palpations: Abdomen is soft.     Tenderness: There is no abdominal tenderness.  Musculoskeletal:     Cervical back: Neck supple.  Skin:    General: Skin is warm and dry.     Capillary Refill: Capillary refill takes less than 2 seconds.  Neurological:     General: No focal deficit present.     Mental Status: She is alert.  Psychiatric:        Mood and Affect: Mood normal.    ED Results / Procedures / Treatments   Labs (all labs ordered are listed, but only abnormal results are displayed) Labs Reviewed  CBC WITH DIFFERENTIAL/PLATELET  BASIC METABOLIC PANEL  D-DIMER, QUANTITATIVE  TROPONIN I (HIGH SENSITIVITY)  EKG EKG  Interpretation  Date/Time:  Sunday April 28 2021 11:22:45 EDT Ventricular Rate:  71 PR Interval:  195 QRS Duration: 93 QT Interval:  387 QTC Calculation: 421 R Axis:   -3 Text Interpretation: Sinus rhythm Confirmed by Twanda Stakes ([id number]) on 04/28/2021 11:26:51 AM  Radiology DG Chest Portable 1 View  Result Date: 04/28/2021 CLINICAL DATA:  Chest pain EXAM: PORTABLE CHEST 1 VIEW COMPARISON:  Chest radiograph dated 08/01/2019. FINDINGS: The heart size and mediastinal contours are within normal limits. Both lungs are clear. Degenerative changes are seen in the spine. IMPRESSION: No active disease. Electronically Signed   By: Tyler  Litton M.D.   On: 04/28/2021 12:06    Procedures Procedures   Medications Ordered in ED Medications  fentaNYL (SUBLIMAZE) injection 50 mcg (has no administration in time range)  cyclobenzaprine (FLEXERIL) tablet 10 mg (has no administration in time range)  ketorolac (TORADOL) 30 MG/ML injection 30 mg (has no administration in time range)    ED Course  I have reviewed the triage vital signs and the nursing notes.  Pertinent labs & imaging results that were available during my care of the patient were reviewed by me and considered in my medical decision making (see chart for details).    MDM Rules/Calculators/A&P                           Oniyah T Mustapha is here with chest wall pain for the last for 5 days.  Unremarkable vitals.  No fever.  She appears to have some reproducible chest wall pain.  Pain radiates up to her shoulder and down her arms at times.  Worse with movement and with lifting things.  She denies any specific trauma.  Over-the-counter medications and tramadol at home have not helped much.  Overall have decided that we will do cardiac work-up with EKG and troponin.  We will get a D-dimer given her history of breast cancer.  She is not having any respiratory symptoms.  Neurovascularly she is intact.  She denies any infectious symptoms.  We  will get a chest x-ray.  We will give a dose of IV fentanyl.  Will evaluate for ACS/PE/infectious process.  Suspect that this is muscular in nature however.  Troponin within normal limits.  D-dimer within normal limits.  Doubt ACS or PE.  EKG shows sinus rhythm.  No ischemic changes.  No significant anemia, electrolyte ab Mody, kidney injury.  No pneumonia.  Overall suspect a muscular process.  Gave Toradol and Flexeril.  Recommend treatment at home with ibuprofen, Tylenol, tramadol, muscle relaxant.  Discharged in good condition.  Recommend follow-up with primary care doctor.  This chart was dictated using voice recognition software.  Despite best efforts to proofread,  errors can occur which can change the documentation meaning.   Final Clinical Impression(s) / ED Diagnoses Final diagnoses:  Chest wall pain    Rx / DC Orders ED Discharge Orders          Ordered    cyclobenzaprine (FLEXERIL) 10 MG tablet  2 times daily PRN        10 /23/22 [place], Yobana [place], DO 04/28/21 1211

## 2021-04-29 ENCOUNTER — Telehealth: Payer: Self-pay | Admitting: *Deleted

## 2021-04-29 NOTE — Telephone Encounter (Signed)
Received call from pt with complaint of left sided chest pain radiating down the left arm.  Pt seen in ED over the weekend and all workup negative for PE and heart attack.  Pt requesting advice from MD if symptoms are related to Anastrozole.  Per MD pt to stop taking Anastrozole x2 weeks to see if symptoms improve and pt to f/u with PCP for further evaluation and tx. Pt verbalized understanding.

## 2021-05-01 ENCOUNTER — Inpatient Hospital Stay: Payer: No Typology Code available for payment source | Admitting: Family Medicine

## 2021-05-02 ENCOUNTER — Ambulatory Visit (INDEPENDENT_AMBULATORY_CARE_PROVIDER_SITE_OTHER)
Admission: RE | Admit: 2021-05-02 | Discharge: 2021-05-02 | Disposition: A | Discharge status: Home / Self Care | Payer: No Typology Code available for payment source | Source: Ambulatory Visit | Attending: Family Medicine | Admitting: Family Medicine

## 2021-05-02 ENCOUNTER — Other Ambulatory Visit: Payer: Self-pay

## 2021-05-02 ENCOUNTER — Encounter: Payer: Self-pay | Admitting: Family Medicine

## 2021-05-02 ENCOUNTER — Inpatient Hospital Stay: Payer: No Typology Code available for payment source | Admitting: Family Medicine

## 2021-05-02 ENCOUNTER — Ambulatory Visit (INDEPENDENT_AMBULATORY_CARE_PROVIDER_SITE_OTHER): Payer: No Typology Code available for payment source | Admitting: Family Medicine

## 2021-05-02 VITALS — BP 128/80 | HR 79 | Temp 98.1°F | Resp 16 | Ht 63.0 in | Wt 214.6 lb

## 2021-05-02 DIAGNOSIS — M25512 Pain in left shoulder: Secondary | ICD-10-CM | POA: Diagnosis not present

## 2021-05-02 DIAGNOSIS — M5030 Other cervical disc degeneration, unspecified cervical region: Secondary | ICD-10-CM

## 2021-05-02 DIAGNOSIS — R0789 Other chest pain: Secondary | ICD-10-CM | POA: Diagnosis not present

## 2021-05-02 LAB — SEDIMENTATION RATE: Sed Rate: 42 mm/hr — ABNORMAL HIGH (ref 0–30)

## 2021-05-02 MED ORDER — PREDNISONE 20 MG PO TABS: ORAL_TABLET | ORAL | 0 refills | Status: DC

## 2021-05-02 MED ORDER — TRAMADOL HCL 50 MG PO TABS
50.0000 mg | ORAL_TABLET | Freq: Four times a day (QID) | ORAL | 0 refills | Status: DC | PRN
Start: 2021-05-02 — End: 2022-05-16

## 2021-05-02 NOTE — Progress Notes (Signed)
Subjective:  Patient ID: Wendy [name], female    DOB: 09-19-1955  Age: 65 y.o. MRN: [id number]  CC:  Chief Complaint  Patient presents with   Follow-up    Pt here for ED follow up had Chest wall pain for 3-5 days noted as muscular on ED exam note, advised ibuprofen and muscle relaxer for relief, pt notes no improvement     HPI Wendy [name] presents for   Chest pain: Evaluated through [place] ER on October 23.  Left chest pain with radiation to left arm at that time, 5 days of symptoms.  Per ER notes some association with movement or arm motion.  Had treated with various meds at home including tramadol, ibuprofen, Tylenol, lidocaine.  DVT/PE risk factor of breast cancer in remission.  D-dimer was obtained which was normal, troponin normal, CBC, BMP were normal.  EKG without ischemic changes. Chest x-ray 1 view without active disease, degenerative changes seen in spine.  Lungs were clear. Treated in ER with fentanyl 50 mcg x 1, Flexeril 10 mg x 1, Toradol 30 mg injection.  Reproducible pain at that time, thought to be chest wall pain.  Prescription for Flexeril and outpatient follow-up.  Since ER visit Still persistent pain in left chest, left arm. Hard to sleep during night. Taking flexeril at bedtime. Slight relief, still wakes up. Ibuprofen 400mg  in am. Slight relief.  Pain in upper back, left shoulder area, not neck.  Ache into all of left chest, under left arm. Able to reproduce with pressing on upper chest or with leaning forward. Sore into left breast with leaning down.  No fever, n/v.  No rash or breast changes.  No dyspnea.  No palpitations.  Pain with movement, not changed with activity otherwise.  No preceding injury or change in activity. No weakness, no hand dysesthesias.  Min relief with 50mg  tramadol.   C spine imaging in 02/2016: The cervical vertebrae are slightly straightened in alignment. There is degenerative disc disease at C3-4, C4-5, C5-6, and  C6-7 levels with some loss of disc space and spurring with sclerosis. No prevertebral soft tissue swelling is seen. The odontoid process is intact. The lung apices are clear.     History Patient Active Problem List   Diagnosis Date Noted   S/P breast reconstruction, right 01/23/2020   History of reduction surgery of left breast 01/23/2020   Port-A-Cath in place 10/20/2019   S/P mastectomy, right 10/19/2019   Acquired absence of breast 09/16/2019   Breast cancer ([place]) 09/08/2019   Genetic testing 06/10/2019   Malignant neoplasm of upper-outer quadrant of right breast in female, estrogen receptor positive ([place]) 06/01/2019   Family history of breast cancer 06/01/2019   Family history of colon cancer 06/01/2019   Family history of brain cancer 06/01/2019   History of colon cancer    Abnormal colonoscopy 10/30/2017   Chronic glaucoma 65 y.o.   HELICOBACTER PYLORI INFECTION 08/27/2007   GERD 08/27/2007   ESOPHAGITIS, REFLUX 05/17/2007   HEMORRHOIDS, INTERNAL 09/22/2003   Past Medical History:  Diagnosis Date   Breast cancer ([place])    Colon cancer ([place])    DDD (degenerative disc disease), cervical    DDD (degenerative disc disease), lumbar    ESOPHAGITIS, REFLUX    Glaucoma    HELICOBACTER PYLORI INFECTION    HEMORRHOIDS, INTERNAL    History of colon cancer    History of colon polyps 2019   Hyperlipidemia    Radius fracture 2006   Left, Distal  Thoracic spondylosis    Tinnitus, bilateral 2013   Past Surgical History:  Procedure Laterality Date   BREAST LUMPECTOMY WITH RADIOACTIVE SEED AND SENTINEL LYMPH NODE BIOPSY Right 06/27/2019   Procedure: RIGHT BREAST LUMPECTOMY WITH RADIOACTIVE SEED AND SENTINEL LYMPH NODE BIOPSY AND RIGHT TARGETED NODE DISSECTION;  Surgeon: Jovita Kussmaul, MD;  Location: [facility];  Service: General;  Laterality: Right;   BREAST RECONSTRUCTION WITH PLACEMENT OF TISSUE EXPANDER AND FLEX HD (ACELLULAR HYDRATED DERMIS) Right  09/08/2019   Procedure: BREAST RECONSTRUCTION WITH PLACEMENT OF TISSUE EXPANDER AND FLEX HD (ACELLULAR HYDRATED DERMIS);  Surgeon: Wallace Going, DO;  Location: [facility];  Service: Plastics;  Laterality: Right;   BREAST REDUCTION WITH MASTOPEXY Left 01/02/2020   Procedure: MASTOPEXY / REDUCTION FOR SYMMETRY;  Surgeon: Wallace Going, DO;  Location: [facility];  Service: Plastics;  Laterality: Left;   CESAREAN SECTION  1989   COLONOSCOPY  2019   HYSTEROSCOPY WITH D & C N/A 09/17/2018   Procedure: DILATATION AND CURETTAGE Marlene Bast;  Surgeon: Brien Few, MD;  Location: [facility];  Service: Gynecology;  Laterality: N/A;   PORT-A-CATH REMOVAL Left 01/02/2020   Procedure: REMOVAL PORT-A-CATH;  Surgeon: Wallace Going, DO;  Location: [facility];  Service: Plastics;  Laterality: Left;   PORTACATH PLACEMENT Left 08/01/2019   Procedure: INSERTION PORT-A-CATH;  Surgeon: Jovita Kussmaul, MD;  Location: [facility];  Service: General;  Laterality: Left;   RE-EXCISION OF BREAST CANCER,SUPERIOR MARGINS Right 08/12/2019   Procedure: RE-EXCISION OF RIGHT BREAST,SUPERIOR MARGINS;  Surgeon: Jovita Kussmaul, MD;  Location: [facility];  Service: General;  Laterality: Right;   RE-EXCISION OF BREAST LUMPECTOMY Right 08/01/2019   Procedure: RE-EXCISION OF RIGHT BREAST CANCER  MARGINS;  Surgeon: Jovita Kussmaul, MD;  Location: [facility];  Service: General;  Laterality: Right;   REMOVAL OF TISSUE EXPANDER AND PLACEMENT OF IMPLANT Right 01/02/2020   Procedure: REMOVAL OF TISSUE EXPANDER AND PLACEMENT OF IMPLANT;  Surgeon: Wallace Going, DO;  Location: [facility];  Service: Plastics;  Laterality: Right;   SIMPLE MASTECTOMY WITH AXILLARY SENTINEL NODE BIOPSY Right 09/08/2019   Procedure: RIGHT BREAST MASTECTOMY;  Surgeon: Jovita Kussmaul, MD;  Location: [facility];  Service: General;  Laterality: Right;   TONSILLECTOMY     age 65 years old   WRIST FRACTURE SURGERY  2006   Allergies  Allergen Reactions   Hydrocodone-Acetaminophen Itching and Other (See Comments)    SEVERE     Oxycodone Itching   Other Other (See Comments)    rakuten eyedrops causes eye redness    Vicodin [Hydrocodone-Acetaminophen] Itching    SEVERE    Brimonidine Rash   Prior to Admission medications   Medication Sig Start Date End Date Taking? Authorizing Provider  anastrozole (ARIMIDEX) 1 MG tablet TAKE 1 TABLET BY MOUTH EVERY DAY 04/17/21  Yes Nicholas Lose, MD  cyclobenzaprine (FLEXERIL) 10 MG tablet Take 1 tablet (10 mg total) by mouth 2 (two) times daily as needed for up to 20 doses for muscle spasms. 04/28/21  Yes Curatolo, Adam, DO  dorzolamide-timolol (COSOPT) 22.3-6.8 MG/ML ophthalmic solution PLACE 1 DROP INTO BOTH EYES 2 (TWO) TIMES DAILY. 09/28/13  Yes [provider]  furosemide (LASIX) 20 MG tablet TAKE 1 TABLET BY MOUTH TWICE A DAY 12/20/20  Yes Nicholas Lose, MD  gabapentin (NEURONTIN) 300 MG capsule TAKE 1 CAPSULE BY MOUTH  twice daily 09/20/20  Yes Nicholas Lose, MD  latanoprost (XALATAN) 0.005 % ophthalmic solution 1 drop at bedtime.    Yes [provider]  triamcinolone ointment (KENALOG) 0.5 % APPLY TO AFFECTED AREA TWICE A DAY 03/01/21  Yes Magrinat, Virgie Dad, MD  venlafaxine XR (EFFEXOR-XR) 75 MG 24 hr capsule Take 1 capsule (75 mg total) by mouth daily with breakfast. 11/12/20  Yes Nicholas Lose, MD  meloxicam (MOBIC) 7.5 MG tablet Take 1 tablet (7.5 mg total) by mouth daily as needed for pain. Patient not taking: Reported on 05/02/2021 09/10/20   [name], MD  prochlorperazine (COMPAZINE) 10 MG tablet Take 1 tablet (10 mg total) by mouth every 6 (six) hours as needed (Nausea or vomiting). Patient not taking: Reported on 01/10/2020 07/22/19 01/19/20  Nicholas Lose, MD   Social History   Socioeconomic History   Marital status:  Divorced    Spouse name: Not on file   Number of children: Not on file   Years of education: Not on file   Highest education level: Not on file  Occupational History   Occupation: medical    Employer: [employer]  Tobacco Use   Smoking status: Never   Smokeless tobacco: Never  Substance and Sexual Activity   Alcohol use: Yes    Comment: occ   Drug use: No   Sexual activity: Not Currently  Other Topics Concern   Not on file  Social History Narrative   Not on file   Social Determinants of Health   Financial Resource Strain: Not on file  Food Insecurity: Not on file  Transportation Needs: Not on file  Physical Activity: Not on file  Stress: Not on file  Social Connections: Not on file  Intimate Partner Violence: Not on file    Review of Systems Per HPI.   Objective:   Vitals:   05/02/21 1101  BP: 128/80  Pulse: 79  Resp: 16  Temp: 98.1 F (36.7 C)  TempSrc: Temporal  SpO2: 99%  Weight: 214 lb 9.6 oz (97.3 kg)  Height: 5\' 3"  (1.6 m)    Physical Exam Vitals reviewed.  Constitutional:      Appearance: Normal appearance. She is well-developed.  HENT:     Head: Normocephalic and atraumatic.  Eyes:     Conjunctiva/sclera: Conjunctivae normal.     Pupils: Pupils are equal, round, and reactive to light.  Neck:     Vascular: No carotid bruit.  Cardiovascular:     Rate and Rhythm: Normal rate and regular rhythm.     Heart sounds: Normal heart sounds.  Pulmonary:     Effort: Pulmonary effort is normal.     Breath sounds: Normal breath sounds.  Abdominal:     Palpations: Abdomen is soft. There is no pulsatile mass.     Tenderness: There is no abdominal tenderness.  Musculoskeletal:     Right lower leg: No edema.     Left lower leg: No edema.     Comments: C-spine no midline bony tenderness, decreased left rotation and left lateral flexion with some reproduction of upper chest, shoulder and upper back symptoms.  Not radiating down arm.  Some  spasm of paraspinal muscles into the trapezius, on left.  Left shoulder, full range of motion, slight discomfort at terminal range of motion.  Slight discomfort with empty can but no weakness or arm drop.  Full rotator cuff strength.  Neurovascular intact distally with cap refill less than 1 second at fingertips.  Reproducible  discomfort in the upper left pectoralis with palpation.  Skin intact no rash.  Skin:    General: Skin is warm and dry.  Neurological:     Mental Status: She is alert and oriented to person, place, and time.  Psychiatric:        Mood and Affect: Mood normal.        Behavior: Behavior normal.     45 minutes spent during visit, including chart review including labs from ER as well as imaging results, previous imaging results, counseling and assimilation of information, exam, discussion of plan, and chart completion.    Assessment & Plan:  Wendy [name] is a 65 y.o. female . Chest wall pain - Plan: DG Cervical Spine Complete, traMADol (ULTRAM) 50 MG tablet, Sedimentation rate, predniSONE (DELTASONE) 20 MG tablet  DDD (degenerative disc disease), cervical - Plan: DG Cervical Spine Complete, predniSONE (DELTASONE) 20 MG tablet  Acute pain of left shoulder - Plan: traMADol (ULTRAM) 50 MG tablet, DG Shoulder Left, predniSONE (DELTASONE) 20 MG tablet  Reassuring emergency room evaluation as well as labs.  Less likely pulmonary or cardiac cause.  Some discomfort with leaning forward but less likely pericarditis.  History of degenerative disc disease and some reproduction of symptoms with cervical spine range of motion particularly with left rotation and lateral flexion, possible flare.  Some discomfort with shoulder range of motion, possible degenerative changes as well.  -Check sed rate, C-spine and shoulder imaging ordered  -Continue Flexeril, tramadol refilled with higher dosing as option, as well as option to take additional gabapentin.  -Start prednisone with taper,  potential side effects and risks discussed.  -Update on symptoms in the next week, urgent care/ER precautions given.  Meds ordered this encounter  Medications   traMADol (ULTRAM) 50 MG tablet    Sig: Take 1-2 tablets (50-100 mg total) by mouth every 6 (six) hours as needed.    Dispense:  30 tablet    Refill:  0   predniSONE (DELTASONE) 20 MG tablet    Sig: 3 by mouth for 3 days, then 2 by mouth for 2 days, then 1 by mouth for 2 days, then 1/2 by mouth for 2 days.    Dispense:  16 tablet    Refill:  0   Patient Instructions  Based on the emergency room evaluation, lab work and testing as well as your exam today I still am suspicious of this being more musculoskeletal than heart or lung related.  I will check another blood test but I suspect that will be normal.  Please have x-rays performed at [facility] for your neck and shoulder today.  Start prednisone for possible nerve cause coming from your neck.  Continue muscle relaxant for now.  Tramadol 1-2 up to every 6-8 hours if needed, but you could also consider taking an additional gabapentin in the morning each day.  Give me an update in the next 4 to 5 days.  Return to the clinic or go to the nearest emergency room if any of your symptoms worsen or new symptoms occur.    Signed,   [name], MD [place] 05/02/21 12:29 PM

## 2021-05-02 NOTE — Patient Instructions (Signed)
Based on the emergency room evaluation, lab work and testing as well as your exam today I still am suspicious of this being more musculoskeletal than heart or lung related.  I will check another blood test but I suspect that will be normal.  Please have x-rays performed at [facility] for your neck and shoulder today.  Start prednisone for possible nerve cause coming from your neck.  Continue muscle relaxant for now.  Tramadol 1-2 up to every 6-8 hours if needed, but you could also consider taking an additional gabapentin in the morning each day.  Give me an update in the next 4 to 5 days.  Return to the clinic or go to the nearest emergency room if any of your symptoms worsen or new symptoms occur.

## 2021-05-09 ENCOUNTER — Encounter: Payer: Self-pay | Admitting: Family Medicine

## 2021-05-09 DIAGNOSIS — R0789 Other chest pain: Secondary | ICD-10-CM

## 2021-05-09 DIAGNOSIS — R700 Elevated erythrocyte sedimentation rate: Secondary | ICD-10-CM

## 2021-05-12 ENCOUNTER — Other Ambulatory Visit: Payer: Self-pay | Admitting: Hematology and Oncology

## 2021-05-21 NOTE — Addendum Note (Signed)
Addended by: [name] R on: 05/21/2021 12:05 PM   Modules accepted: Orders

## 2021-05-21 NOTE — Telephone Encounter (Signed)
See prior notes. Slight elevated sed rate, persistent chest symptoms. Echo ordered.

## 2021-05-24 ENCOUNTER — Other Ambulatory Visit: Payer: Self-pay

## 2021-05-24 ENCOUNTER — Ambulatory Visit (HOSPITAL_COMMUNITY): Payer: No Typology Code available for payment source | Attending: Internal Medicine

## 2021-05-24 DIAGNOSIS — R700 Elevated erythrocyte sedimentation rate: Secondary | ICD-10-CM

## 2021-05-24 DIAGNOSIS — R0789 Other chest pain: Secondary | ICD-10-CM | POA: Insufficient documentation

## 2021-05-24 LAB — ECHOCARDIOGRAM LIMITED
Area-P 1/2: 3.91 cm2
S' Lateral: 2.6 cm

## 2021-05-26 ENCOUNTER — Encounter: Payer: Self-pay | Admitting: Family Medicine

## 2021-05-26 DIAGNOSIS — R0789 Other chest pain: Secondary | ICD-10-CM

## 2021-05-26 DIAGNOSIS — M5030 Other cervical disc degeneration, unspecified cervical region: Secondary | ICD-10-CM

## 2021-05-26 DIAGNOSIS — M25512 Pain in left shoulder: Secondary | ICD-10-CM

## 2021-05-27 MED ORDER — CYCLOBENZAPRINE HCL 5 MG PO TABS: 5.0000 mg | ORAL_TABLET | Freq: Three times a day (TID) | ORAL | 1 refills | Status: DC | PRN

## 2021-05-27 MED ORDER — MELOXICAM 7.5 MG PO TABS: 7.5000 mg | ORAL_TABLET | Freq: Every day | ORAL | 0 refills | Status: DC

## 2021-06-07 ENCOUNTER — Other Ambulatory Visit (HOSPITAL_COMMUNITY): Payer: No Typology Code available for payment source

## 2021-06-14 ENCOUNTER — Ambulatory Visit: Payer: No Typology Code available for payment source | Admitting: Plastic Surgery

## 2021-06-19 ENCOUNTER — Other Ambulatory Visit: Payer: Self-pay | Admitting: Hematology and Oncology

## 2021-06-20 ENCOUNTER — Encounter: Payer: Self-pay | Admitting: Family Medicine

## 2021-06-20 NOTE — Telephone Encounter (Signed)
Pt is asking about seeing Neurology due to return of sxs

## 2021-06-21 NOTE — Telephone Encounter (Signed)
Neurology referral may be needed but I would like to see her first as we may be able to try some different medications depending on her exam/symptoms.  Please schedule visit the next week or 2 if available.

## 2021-06-21 NOTE — Telephone Encounter (Signed)
Pt is scheduled for an appt next week

## 2021-06-27 ENCOUNTER — Other Ambulatory Visit: Payer: Self-pay

## 2021-06-27 ENCOUNTER — Ambulatory Visit (INDEPENDENT_AMBULATORY_CARE_PROVIDER_SITE_OTHER): Payer: No Typology Code available for payment source | Admitting: Family Medicine

## 2021-06-27 ENCOUNTER — Encounter: Payer: Self-pay | Admitting: Family Medicine

## 2021-06-27 VITALS — BP 122/79 | HR 86 | Temp 98.1°F | Resp 18 | Ht 63.0 in | Wt 216.6 lb

## 2021-06-27 DIAGNOSIS — M79602 Pain in left arm: Secondary | ICD-10-CM

## 2021-06-27 DIAGNOSIS — M792 Neuralgia and neuritis, unspecified: Secondary | ICD-10-CM | POA: Diagnosis not present

## 2021-06-27 DIAGNOSIS — M5030 Other cervical disc degeneration, unspecified cervical region: Secondary | ICD-10-CM

## 2021-06-27 DIAGNOSIS — M25512 Pain in left shoulder: Secondary | ICD-10-CM | POA: Diagnosis not present

## 2021-06-27 MED ORDER — PREDNISONE 20 MG PO TABS: ORAL_TABLET | ORAL | 0 refills | Status: DC

## 2021-06-27 NOTE — Progress Notes (Signed)
Subjective:  Patient ID: Wendy [name], female    DOB: 11-Nov-1955  Age: 65 y.o. MRN: [id number]  CC:  Chief Complaint  Patient presents with   Follow-up    Patient states she is here for follow up with arm and chest pain . Pt is still having some problems with pain.    HPI Wendy [name] presents for   Chest pain See previous notes, last seen in October.  Initially evaluated through ER with negative D-dimer, normal troponins, CBC, BMP and EKG without ischemic changes.  Chest x-ray without active disease.  Some degenerative changes seen in spine.  Lungs were clear.  Treated with fentanyl, Flexeril, Toradol in the ER with some reproducible pain thought to be chest wall pain at that time.  Some positional pain on my evaluation.  Leaning forward.  Also reproducible with pressure in the upper chest.  Continued on tramadol, C-spine imaging obtained October 27.  Degenerative disc disease in his cervical spine with bilateral bony foraminal encroachment secondary to uncinate spurring.  Prednisone taper given on October 27.  With positional symptoms I did check a sed rate that was mildly elevated at 42,  echocardiogram was ordered to rule out pericarditis or pericardial effusion. This was performed on 05/24/2021.  No pericardial effusion.  EF 60 to 65%.  Left ventricular function normal.  Right ventricular systolic function normal.  No significant change from previous study in July 2021. Patient message November 21, persistent symptoms, treated with cyclobenzaprine, meloxicam. Some relief with tramadol prior.  Took mobic daily at night, flexeril one at night. Improved with this approach, mid chest to left arm, upper back/shoulder area.  Had been improving in past month. Saw Copy. Told xrays were ok.  Trigger finger injection in R thumb.   See note 12/15 - symptoms improved, stopped mobic, flexeril about 2 weeks ago, symptoms returned in past week or 10 days. Less pain in chest, more in  left arm/shoulder.  Restarted mobic, flexeril at night past 3 days - some improvement.  Still notes in evening after work at times.  Soreness.  Some parasthesias in fingers/toes since chemo.     History Patient Active Problem List   Diagnosis Date Noted   S/P breast reconstruction, right 01/23/2020   History of reduction surgery of left breast 01/23/2020   Port-A-Cath in place 10/20/2019   S/P mastectomy, right 10/19/2019   Acquired absence of breast 09/16/2019   Breast cancer ([place]) 09/08/2019   Genetic testing 06/10/2019   Malignant neoplasm of upper-outer quadrant of right breast in female, estrogen receptor positive ([place]) 06/01/2019   Family history of breast cancer 06/01/2019   Family history of colon cancer 06/01/2019   Family history of brain cancer 06/01/2019   History of colon cancer    Abnormal colonoscopy 10/30/2017   Chronic glaucoma 54/62/7035   HELICOBACTER PYLORI INFECTION 08/27/2007   GERD 08/27/2007   ESOPHAGITIS, REFLUX 05/17/2007   HEMORRHOIDS, INTERNAL 09/22/2003   Past Medical History:  Diagnosis Date   Breast cancer ([place])    Colon cancer ([place])    DDD (degenerative disc disease), cervical    DDD (degenerative disc disease), lumbar    ESOPHAGITIS, REFLUX    Glaucoma    HELICOBACTER PYLORI INFECTION    HEMORRHOIDS, INTERNAL    History of colon cancer    History of colon polyps 2019   Hyperlipidemia    Radius fracture 2006   Left, Distal   Thoracic spondylosis    Tinnitus, bilateral 2013  Past Surgical History:  Procedure Laterality Date   BREAST LUMPECTOMY WITH RADIOACTIVE SEED AND SENTINEL LYMPH NODE BIOPSY Right 06/27/2019   Procedure: RIGHT BREAST LUMPECTOMY WITH RADIOACTIVE SEED AND SENTINEL LYMPH NODE BIOPSY AND RIGHT TARGETED NODE DISSECTION;  Surgeon: Jovita Kussmaul, MD;  Location: [place];  Service: General;  Laterality: Right;   BREAST RECONSTRUCTION WITH PLACEMENT OF TISSUE EXPANDER AND FLEX HD (ACELLULAR HYDRATED  DERMIS) Right 09/08/2019   Procedure: BREAST RECONSTRUCTION WITH PLACEMENT OF TISSUE EXPANDER AND FLEX HD (ACELLULAR HYDRATED DERMIS);  Surgeon: Wallace Going, DO;  Location: [place];  Service: Plastics;  Laterality: Right;   BREAST REDUCTION WITH MASTOPEXY Left 01/02/2020   Procedure: MASTOPEXY / REDUCTION FOR SYMMETRY;  Surgeon: Wallace Going, DO;  Location: [place];  Service: Plastics;  Laterality: Left;   CESAREAN SECTION  1989   COLONOSCOPY  2019   HYSTEROSCOPY WITH D & C N/A 09/17/2018   Procedure: DILATATION AND CURETTAGE Marlene Bast;  Surgeon: Brien Few, MD;  Location: [place];  Service: Gynecology;  Laterality: N/A;   PORT-A-CATH REMOVAL Left 01/02/2020   Procedure: REMOVAL PORT-A-CATH;  Surgeon: Wallace Going, DO;  Location: [place] [facility];  Service: Plastics;  Laterality: Left;   PORTACATH PLACEMENT Left 08/01/2019   Procedure: INSERTION PORT-A-CATH;  Surgeon: Jovita Kussmaul, MD;  Location: [place];  Service: General;  Laterality: Left;   RE-EXCISION OF BREAST CANCER,SUPERIOR MARGINS Right 08/12/2019   Procedure: RE-EXCISION OF RIGHT BREAST,SUPERIOR MARGINS;  Surgeon: Jovita Kussmaul, MD;  Location: [place];  Service: General;  Laterality: Right;   RE-EXCISION OF BREAST LUMPECTOMY Right 08/01/2019   Procedure: RE-EXCISION OF RIGHT BREAST CANCER  MARGINS;  Surgeon: Jovita Kussmaul, MD;  Location: [place];  Service: General;  Laterality: Right;   REMOVAL OF TISSUE EXPANDER AND PLACEMENT OF IMPLANT Right 01/02/2020   Procedure: REMOVAL OF TISSUE EXPANDER AND PLACEMENT OF IMPLANT;  Surgeon: Wallace Going, DO;  Location: [place];  Service: Plastics;  Laterality: Right;   SIMPLE MASTECTOMY WITH AXILLARY SENTINEL NODE BIOPSY Right 09/08/2019   Procedure: RIGHT BREAST MASTECTOMY;  Surgeon: Jovita Kussmaul, MD;  Location: [place];  Service: General;  Laterality: Right;   TONSILLECTOMY     age 67 years old   WRIST FRACTURE SURGERY  2006   Allergies  Allergen Reactions   Hydrocodone-Acetaminophen Itching and Other (See Comments)    SEVERE     Oxycodone Itching   Other Other (See Comments)    rakuten eyedrops causes eye redness    Vicodin [Hydrocodone-Acetaminophen] Itching    SEVERE    Brimonidine Rash   Prior to Admission medications   Medication Sig Start Date End Date Taking? Authorizing Provider  anastrozole (ARIMIDEX) 1 MG tablet TAKE 1 TABLET BY MOUTH EVERY DAY 04/17/21  Yes Nicholas Lose, MD  cyclobenzaprine (FLEXERIL) 5 MG tablet Take 1 tablet (5 mg total) by mouth 3 (three) times daily as needed for muscle spasms (start qhs prn due to sedation). 05/27/21  Yes [name], MD  dorzolamide-timolol (COSOPT) 22.3-6.8 MG/ML ophthalmic solution PLACE 1 DROP INTO BOTH EYES 2 (TWO) TIMES DAILY. 09/28/13  Yes [provider]  furosemide (LASIX) 20 MG tablet TAKE 1 TABLET BY MOUTH TWICE A DAY 06/19/21  Yes Nicholas Lose, MD  gabapentin (NEURONTIN) 300 MG capsule TAKE 1 CAPSULE BY MOUTH twice daily 09/20/20  Yes Nicholas Lose, MD  latanoprost (XALATAN) 0.005 % ophthalmic solution 1 drop at bedtime.    Yes [provider]  meloxicam (MOBIC) 7.5 MG tablet Take 1 tablet (7.5 mg total) by mouth daily. 05/27/21  Yes [name], MD  traMADol (ULTRAM) 50 MG tablet Take 1-2 tablets (50-100 mg total) by mouth every 6 (six) hours as needed. 05/02/21  Yes [name], MD  venlafaxine XR (EFFEXOR-XR) 75 MG 24 hr capsule TAKE 1 CAPSULE BY MOUTH DAILY WITH BREAKFAST. 05/13/21  Yes Nicholas Lose, MD  predniSONE (DELTASONE) 20 MG tablet 3 by mouth for 3 days, then 2 by mouth for 2 days, then 1 by mouth for 2 days, then 1/2 by mouth for 2 days. 05/02/21   [name], MD  triamcinolone ointment (KENALOG) 0.5 % APPLY TO AFFECTED AREA TWICE A DAY 03/01/21   Magrinat, Virgie Dad,  MD  prochlorperazine (COMPAZINE) 10 MG tablet Take 1 tablet (10 mg total) by mouth every 6 (six) hours as needed (Nausea or vomiting). Patient not taking: Reported on 01/10/2020 07/22/19 01/19/20  Nicholas Lose, MD   Social History   Socioeconomic History   Marital status: Divorced    Spouse name: Not on file   Number of children: Not on file   Years of education: Not on file   Highest education level: Not on file  Occupational History   Occupation: medical    Employer: [employer]  Tobacco Use   Smoking status: Never   Smokeless tobacco: Never  Substance and Sexual Activity   Alcohol use: Yes    Comment: occ   Drug use: No   Sexual activity: Not Currently  Other Topics Concern   Not on file  Social History Narrative   Not on file   Social Determinants of Health   Financial Resource Strain: Not on file  Food Insecurity: Not on file  Transportation Needs: Not on file  Physical Activity: Not on file  Stress: Not on file  Social Connections: Not on file  Intimate Partner Violence: Not on file    Review of Systems Per HPI.   Objective:   Vitals:   06/27/21 1551  BP: 122/79  Pulse: 86  Resp: 18  Temp: 98.1 F (36.7 C)  TempSrc: Temporal  SpO2: 99%  Weight: 216 lb 9.6 oz (98.2 kg)  Height: 5\' 3"  (1.6 m)     Physical Exam Constitutional:      General: She is not in acute distress.    Appearance: Normal appearance. She is well-developed.  HENT:     Head: Normocephalic and atraumatic.  Cardiovascular:     Rate and Rhythm: Normal rate.  Pulmonary:     Effort: Pulmonary effort is normal.  Musculoskeletal:     Comments: C-spine, no midline bony tenderness but does have some tenderness of the paraspinals, levator, trapezius on the left with slight spasm.  Pain into the left shoulder, left upper arm with left lateral rotation and left lateral flexion which were slightly diminished compared to right.  Strength in the upper extremity was equal  bilaterally.  Difficulty obtaining reflexes bilaterally.  Neurovascular intact distally to fingertips.  Neurological:     Mental Status: She is alert and oriented to person, place, and time.  Psychiatric:        Mood and Affect: Mood normal.       Assessment & Plan:  Wendy [name] is a 65 y.o. female . DDD (degenerative disc disease), cervical - Plan: predniSONE (DELTASONE) 20 MG tablet,  Ambulatory referral to Spine Surgery  Acute pain of left shoulder - Plan: predniSONE (DELTASONE) 20 MG tablet, Ambulatory referral to Spine Surgery  Left arm pain - Plan: predniSONE (DELTASONE) 20 MG tablet, Ambulatory referral to Spine Surgery  Radicular pain in left arm - Plan: predniSONE (DELTASONE) 20 MG tablet, Ambulatory referral to Spine Surgery  Intermittent improvement in symptoms then recurrent.  Cervical degenerative disc disease.  Suspect radicular pain from cervical source. no weakness.  Some improvement with anti-inflammatories previously.  -Based on continued/timing of symptoms will refer to spine specialist to decide on advanced imaging, PT or injection.  -Stop meloxicam, start prednisone, potential side effects discussed.  Continue Flexeril as needed.  -RTC precautions.  Meds ordered this encounter  Medications   predniSONE (DELTASONE) 20 MG tablet    Sig: 3 by mouth for 3 days, then 2 by mouth for 2 days, then 1 by mouth for 2 days, then 1/2 by mouth for 2 days.    Dispense:  16 tablet    Refill:  0   Patient Instructions  Pain appears to be coming from your neck. Stop meloxicam, start prednisone.  I will refer you to a back specialist to decide on possible further imaging like an MRI, physical therapy or other treatment.  Okay to use Flexeril for now.  Follow-up if any worsening symptoms prior to seeing orthopedics or if there are questions.   If you have lab work done today you will be contacted with your lab results within the next 2 weeks.  If you have not heard from [place]  then please contact us. The fastest way to get your results is to register for My Chart.   IF you received an x-ray today, you will receive an invoice from [facility]. Please contact [facility] at [phone] with questions or concerns regarding your invoice.   IF you received labwork today, you will receive an invoice from [facility]. Please contact [facility] at [phone] with questions or concerns regarding your invoice.   Our billing staff will not be able to assist you with questions regarding bills from these companies.  You will be contacted with the lab results as soon as they are available. The fastest way to get your results is to activate your My Chart account. Instructions are located on the last page of this paperwork. If you have not heard from [place] regarding the results in 2 weeks, please contact this office.       Signed,   [name], MD [place] 06/27/21 6:32 PM

## 2021-06-27 NOTE — Patient Instructions (Addendum)
Pain appears to be coming from your neck. Stop meloxicam, start prednisone.  I will refer you to a back specialist to decide on possible further imaging like an MRI, physical therapy or other treatment.  Okay to use Flexeril for now.  Follow-up if any worsening symptoms prior to seeing orthopedics or if there are questions.   If you have lab work done today you will be contacted with your lab results within the next 2 weeks.  If you have not heard from [place] then please contact us. The fastest way to get your results is to register for My Chart.   IF you received an x-ray today, you will receive an invoice from [facility]. Please contact [facility] at [phone] with questions or concerns regarding your invoice.   IF you received labwork today, you will receive an invoice from [facility]. Please contact [facility] at [phone] with questions or concerns regarding your invoice.   Our billing staff will not be able to assist you with questions regarding bills from these companies.  You will be contacted with the lab results as soon as they are available. The fastest way to get your results is to activate your My Chart account. Instructions are located on the last page of this paperwork. If you have not heard from [place] regarding the results in 2 weeks, please contact this office.

## 2021-07-16 ENCOUNTER — Other Ambulatory Visit: Payer: Self-pay | Admitting: Oncology

## 2021-11-01 HISTORY — PX: TRIGGER FINGER RELEASE: SHX641

## 2021-11-06 LAB — HM MAMMOGRAPHY

## 2021-11-13 ENCOUNTER — Other Ambulatory Visit: Payer: Self-pay | Admitting: Hematology and Oncology

## 2021-11-13 DIAGNOSIS — C50411 Malignant neoplasm of upper-outer quadrant of right female breast: Secondary | ICD-10-CM

## 2021-11-21 LAB — HM COLONOSCOPY

## 2021-12-20 ENCOUNTER — Other Ambulatory Visit: Payer: Self-pay | Admitting: Hematology and Oncology

## 2022-04-25 ENCOUNTER — Other Ambulatory Visit: Payer: Self-pay | Admitting: Hematology and Oncology

## 2022-05-08 ENCOUNTER — Other Ambulatory Visit: Payer: Self-pay | Admitting: Hematology and Oncology

## 2022-05-08 DIAGNOSIS — C50411 Malignant neoplasm of upper-outer quadrant of right female breast: Secondary | ICD-10-CM

## 2022-05-12 ENCOUNTER — Telehealth: Payer: Self-pay | Admitting: Hematology and Oncology

## 2022-05-12 NOTE — Telephone Encounter (Signed)
Called patient to schedule annual follow-up per 11/6 in basket. Patient had not been seen this year for follow-up. Notified patient of updated appointment.

## 2022-05-16 ENCOUNTER — Ambulatory Visit (INDEPENDENT_AMBULATORY_CARE_PROVIDER_SITE_OTHER): Payer: No Typology Code available for payment source | Admitting: Family Medicine

## 2022-05-16 ENCOUNTER — Encounter: Payer: Self-pay | Admitting: Family Medicine

## 2022-05-16 VITALS — BP 130/82 | HR 81 | Temp 98.9°F | Resp 17 | Ht 63.0 in | Wt 219.5 lb

## 2022-05-16 DIAGNOSIS — R29898 Other symptoms and signs involving the musculoskeletal system: Secondary | ICD-10-CM | POA: Diagnosis not present

## 2022-05-16 DIAGNOSIS — R200 Anesthesia of skin: Secondary | ICD-10-CM | POA: Diagnosis not present

## 2022-05-16 DIAGNOSIS — Z23 Encounter for immunization: Secondary | ICD-10-CM

## 2022-05-16 MED ORDER — PREDNISONE 10 MG PO TABS: ORAL_TABLET | ORAL | 0 refills | Status: DC

## 2022-05-16 NOTE — Patient Instructions (Signed)
Follow up as needed or as scheduled START the Prednisone- 3 tabs at the same time x3 days, then 2 tabs x3 days, and then 1 tab daily- take w/ food HEAT the neck Gentle massage if someone is willing or you have a pillow/machine If no improvement in the pain or numbness- please let us know and we'll get you back in w/ Dr Rolena Infante Call with any questions or concerns Hang in there!!!

## 2022-05-16 NOTE — Progress Notes (Signed)
   Subjective:    Patient ID: Wendy [name], female    DOB: 1955-12-24, 66 y.o.   MRN: [id number]  HPI Arm weakness/numbness- pt reports sxs started ~1 week ago.  'pain and tingling numbness'.  Sxs are from elbow down into wrists.  Sxs are bilateral.  Pt has hx of neck issues and has seen Dr Rolena Infante- dx'd w/ DDD.  Xrays of Cspine in Oct 2022 showed 'DDD w/ bilateral bony foraminal encroachment secondary to spurring'.     Review of Systems For ROS see HPI     Objective:   Physical Exam Vitals reviewed.  Constitutional:      General: She is not in acute distress.    Appearance: Normal appearance. She is not ill-appearing.  HENT:     Head: Normocephalic and atraumatic.  Eyes:     Extraocular Movements: Extraocular movements intact.     Conjunctiva/sclera: Conjunctivae normal.     Pupils: Pupils are equal, round, and reactive to light.  Musculoskeletal:     Cervical back: Normal range of motion. No rigidity.  Lymphadenopathy:     Cervical: No cervical adenopathy.  Skin:    General: Skin is warm and dry.  Neurological:     General: No focal deficit present.     Mental Status: She is alert and oriented to person, place, and time.     Sensory: No sensory deficit.     Motor: No weakness.     Coordination: Coordination normal.  Psychiatric:        Mood and Affect: Mood normal.        Behavior: Behavior normal.        Thought Content: Thought content normal.           Assessment & Plan:  Arm numbness/weakness- new.  Sxs are bilateral which points towards C spine issue rather than an arm or shoulder issue.  Will start Prednisone taper.  Encouraged heat and massage.  If no improvement, will need to get back in to see Dr Rolena Infante.  Reviewed supportive care and red flags that should prompt return.  Pt expressed understanding and is in agreement w/ plan.

## 2022-06-01 NOTE — Progress Notes (Signed)
Patient Care Team: [name], MD as PCP - General (Family Medicine) Jovita Kussmaul, MD as Consulting Physician (General Surgery) Kyung Rudd, MD as Consulting Physician (Radiation Oncology) Nicholas Lose, MD as Consulting Physician (Hematology and Oncology)  DIAGNOSIS:  Encounter Diagnoses  Name Primary?   Malignant neoplasm of upper-outer quadrant of right breast in female, estrogen receptor positive ([place]) Yes   Postmenopausal     SUMMARY OF ONCOLOGIC HISTORY: Oncology History  Malignant neoplasm of upper-outer quadrant of right breast in female, estrogen receptor positive ([place])  05/20/2019 Initial Diagnosis   Palpable right breast mass superficial, ultrasound 11 o'clock position 1.7 cm: Biopsy revealed grade 3 IDC with DCIS ER 100%, PR 30%, Ki-67 20%, HER-2 by IHC negative, axilla lymph node biopsy positive   06/01/2019 Cancer Staging   Staging form: Breast, AJCC 8th Edition - Clinical stage from 06/01/2019: Stage IIA (cT1c, cN1, cM0, G3, ER+, PR+, HER2-)   06/27/2019 Surgery   Right lumpectomy [name]) [phone]): IDC, grade 3, at least 2.5cm, involving a complex sclerosing lesion, 1.1cm, with high grade DCIS, involved margins, 1/3 right axillary lymph nodes positive, 1.4cm.    07/11/2019 Cancer Staging   Staging form: Breast, AJCC 8th Edition - Pathologic stage from 07/11/2019: Stage IIA (pT2, pN1a, cM0, G3, ER+, PR+, HER2-)   07/19/2019 Oncotype testing   Mammaprint: high risk, 29% chance of recurrence in 10 years without systemic treatment.    09/08/2019 Surgery   Re-excision x2 (08/01/19 and 08/12/19) [id number] and [id number])   Right mastectomy [name]) [phone]): residual carcinoma and DCIS, 1/5 lymph nodes positive, clear margins.    10/14/2019 - 12/15/2019 Chemotherapy   dexamethasone (DECADRON) 4 MG tablet, 4 mg (100 % of original dose 4 mg), Oral, Daily, 1 of 1 cycle, Start date: 07/22/2019, End date: 11/24/2019. Dose modification: 4 mg (original dose 4  mg, Cycle 0).  palonosetron (ALOXI) injection 0.25 mg, 0.25 mg, Intravenous,  Once, 4 of 4 cycles. Administration: 0.25 mg (10/14/2019), 0.25 mg (11/04/2019), 0.25 mg (11/24/2019), 0.25 mg (12/15/2019).  pegfilgrastim (NEULASTA ONPRO KIT) injection 6 mg, 6 mg, Subcutaneous, Once, 4 of 4 cycles. Administration: 6 mg (10/14/2019), 6 mg (11/04/2019), 6 mg (11/24/2019), 6 mg (12/15/2019).  cyclophosphamide (CYTOXAN) 1,260 mg in sodium chloride 0.9 % 250 mL chemo infusion, 600 mg/m2 = 1,260 mg, Intravenous,  Once, 4 of 4 cycles. Dose modification: 500 mg/m2 (original dose 600 mg/m2, Cycle 2, Reason: Dose not tolerated). Administration: 1,260 mg (10/14/2019), 1,040 mg (11/04/2019), 1,040 mg (11/24/2019), 1,040 mg (12/15/2019).  DOCEtaxel (TAXOTERE) 160 mg in sodium chloride 0.9 % 250 mL chemo infusion, 75 mg/m2 = 160 mg, Intravenous,  Once, 4 of 4 cycles. Dose modification: 65 mg/m2 (original dose 75 mg/m2, Cycle 2, Reason: Dose not tolerated). Administration: 160 mg (10/14/2019), 140 mg (11/04/2019), 140 mg (11/24/2019), 140 mg (12/15/2019).   01/31/2020 - 03/16/2020 Radiation Therapy   The patient initially received a dose of 50.4 Gy in 28 fractions to the chest wall and supraclavicular region. This was delivered using a 3-D conformal, 4 field technique. The patient then received a boost to the mastectomy scar. This delivered an additional 10 Gy in 5 fractions using an en face electron field. The total dose was 60.4 Gy.   03/2020 - 03/2027 Anti-estrogen oral therapy   Anastrozole     CHIEF COMPLIANT: Follow-up of right breast cancer on anastrozole   INTERVAL HISTORY: Wendy [name] is a 66 y.o. with above-mentioned history of right breast cancer who underwent a lumpectomy followed by re-excision  and then a mastectomy, adjuvant chemotherapy, radiation, and antiestrogen therapy with anastrozole (stopped on 07/30/20). She presents to the clinic today for follow-up. She states that she is tolerating the anastrozole but the hot  flashes and neuropathy are still there. She says the leg swelling still happen occasionally. She did go to physical therapy.   ALLERGIES:  is allergic to hydrocodone-acetaminophen, oxycodone, other, vicodin [hydrocodone-acetaminophen], and brimonidine.  MEDICATIONS:  Current Outpatient Medications  Medication Sig Dispense Refill   anastrozole (ARIMIDEX) 1 MG tablet TAKE 1 TABLET BY MOUTH EVERY DAY 90 tablet 3   dorzolamide-timolol (COSOPT) 22.3-6.8 MG/ML ophthalmic solution PLACE 1 DROP INTO BOTH EYES 2 (TWO) TIMES DAILY.     Fezolinetant (VEOZAH) 45 MG TABS Take 1 tablet by mouth daily. 30 tablet 6   furosemide (LASIX) 20 MG tablet TAKE 1 TABLET BY MOUTH TWICE A DAY 180 tablet 1   gabapentin (NEURONTIN) 300 MG capsule TAKE 1 CAPSULE BY MOUTH TWICE A DAY 180 capsule 3   latanoprost (XALATAN) 0.005 % ophthalmic solution 1 drop at bedtime.      venlafaxine XR (EFFEXOR-XR) 75 MG 24 hr capsule TAKE 1 CAPSULE BY MOUTH DAILY WITH BREAKFAST. 90 capsule 0   No current facility-administered medications for this visit.   Facility-Administered Medications Ordered in Other Visits  Medication Dose Route Frequency Provider Last Rate Last Admin   heparin lock flush 100 unit/mL  500 Units Intravenous Once Nicholas Lose, MD       sodium chloride flush (NS) 0.9 % injection 10 mL  10 mL Intravenous PRN Nicholas Lose, MD   10 mL at 10/14/19 1032    PHYSICAL EXAMINATION: ECOG PERFORMANCE STATUS: 1 - Symptomatic but completely ambulatory  Vitals:   06/06/22 1107  BP: (!) 157/81  Pulse: 85  Resp: 18  Temp: 98.5 F (36.9 C)  SpO2: 98%   Filed Weights   06/06/22 1107  Weight: 222 lb 4.8 oz (100.8 kg)    BREAST: No palpable lumps or nodules in the right reconstructed breast.  Left breast has been reduced there is a palpable nodule around the central left breast which was apparently proven to be fat necrosis.  (exam performed in the presence of a chaperone)  LABORATORY DATA:  I have reviewed the  data as listed    Latest Ref Rng & Units 04/28/2021   11:22 AM 03/27/2020    2:51 PM 01/18/2020    1:53 PM  CMP  Glucose 70 - 99 mg/dL 89  90  95   BUN 8 - 23 mg/dL _0 Creatinine 0.44 - 1.00 mg/dL 0.63  0.67  0.69   Sodium 135 - 145 mmol/L 139  139  144   Potassium 3.5 - 5.1 mmol/L 3.8  4.2  4.0   Chloride 98 - 111 mmol/L 105  103  111   CO2 22 - 32 mmol/L _1 Calcium 8.9 - 10.3 mg/dL 9.2  9.4  8.8   Total Protein 6.5 - 8.1 g/dL  7.4  6.1   Total Bilirubin 0.3 - 1.2 mg/dL  0.5  0.7   Alkaline Phos 38 - 126 U/L  87  56   AST 15 - 41 U/L  19  19   ALT 0 - 44 U/L  10  11     Lab Results  Component Value Date   WBC 6.5 04/28/2021   HGB 14.1 04/28/2021   HCT 43.7 04/28/2021   MCV 86.5  04/28/2021   PLT 162 04/28/2021   NEUTROABS 3.7 04/28/2021    ASSESSMENT & PLAN:  Malignant neoplasm of upper-outer quadrant of right breast in female, estrogen receptor positive ([place]) 06/27/2019: Right lumpectomy [name]): IDC, grade 3, at least 2.5cm, involving a complex sclerosing lesion, 1.1cm, with high grade DCIS, involved margins, 1/3 right axillary lymph nodes positive, 1.4cm.  ER 100%, PR 30%, Ki-67 20%, HER-2 IHC negative T2 N1 stage IIA MammaPrint: High risk, 10-year risk of recurrence untreated: 29% 09/08/2019:Re-excision x2 (08/01/19 and 08/12/19) followed by right mastectomy [name]): residual carcinoma and DCIS, 1/5 lymph nodes positive, clear margins.    Treatment plan: 1. Adjuvant chemotherapy with Taxotere and Cytoxan every 3 weeks x4 completed 12/15/2019 2.  Adjuvant radiation therapy completed 03/16/2020 3.  Follow-up adjuvant antiestrogen therapy with anastrozole 1 mg daily x7 years Genetic testing ------------------------------------------------------------------------------------------------------ Treatment plan: Plan for adjuvant antiestrogen therapy with anastrozole x7 years started September 2021   Bilateral lower extremity swelling: 01/26/2020: Echocardiogram EF  60 to 65% 01/21/2020: Ultrasound lower extremity: No DVT Continue with Lasix as needed   Chemo-induced peripheral neuropathy:  Anastrozole Toxicities: Hot flashes.  Sent prescription for [employer].   Severe pain in the reconstructed right breast: Capsular contracture.   She had revision reconstruction at [facility] and a reduction.  She is doing much better.  Small area of fat necrosis was present   Breast Cancer Surveillance: Mammogram:  At Dr. Kennith Maes office Bone density: 09/12/2020: T score -1.2: Mild osteopenia: Calcium and vitamin D we will request another bone density test in March 2024 at the breast center. Breast exam 06/06/2022: Benign  Return to clinic in 1 year for follow-up    Orders Placed This Encounter  Procedures   DG Bone Density    Standing Status:   Future    Standing Expiration Date:   06/06/2023    Order Specific Question:   Reason for Exam (SYMPTOM  OR DIAGNOSIS REQUIRED)    Answer:   postmenopausal    Order Specific Question:   Preferred imaging location?    Answer:   [facility]   The patient has a good understanding of the overall plan. she agrees with it. she will call with any problems that may develop before the next visit here. Total time spent: 30 mins including face to face time and time spent for planning, charting and co-ordination of care   Harriette Ohara, MD 06/06/22    I Gardiner Coins am scribing for Dr. Lindi Adie  I have reviewed the above documentation for accuracy and completeness, and I agree with the above.

## 2022-06-06 ENCOUNTER — Other Ambulatory Visit: Payer: Self-pay | Admitting: Hematology and Oncology

## 2022-06-06 ENCOUNTER — Other Ambulatory Visit: Payer: Self-pay

## 2022-06-06 ENCOUNTER — Inpatient Hospital Stay
Payer: No Typology Code available for payment source | Attending: Hematology and Oncology | Admitting: Hematology and Oncology

## 2022-06-06 ENCOUNTER — Telehealth: Payer: Self-pay

## 2022-06-06 VITALS — BP 157/81 | HR 85 | Temp 98.5°F | Resp 18 | Ht 63.0 in | Wt 222.3 lb

## 2022-06-06 DIAGNOSIS — Z9221 Personal history of antineoplastic chemotherapy: Secondary | ICD-10-CM | POA: Insufficient documentation

## 2022-06-06 DIAGNOSIS — Z79899 Other long term (current) drug therapy: Secondary | ICD-10-CM | POA: Diagnosis not present

## 2022-06-06 DIAGNOSIS — Z923 Personal history of irradiation: Secondary | ICD-10-CM | POA: Insufficient documentation

## 2022-06-06 DIAGNOSIS — Z9011 Acquired absence of right breast and nipple: Secondary | ICD-10-CM | POA: Insufficient documentation

## 2022-06-06 DIAGNOSIS — Z780 Asymptomatic menopausal state: Secondary | ICD-10-CM | POA: Diagnosis not present

## 2022-06-06 DIAGNOSIS — T451X5A Adverse effect of antineoplastic and immunosuppressive drugs, initial encounter: Secondary | ICD-10-CM | POA: Insufficient documentation

## 2022-06-06 DIAGNOSIS — G620 Drug-induced polyneuropathy: Secondary | ICD-10-CM | POA: Diagnosis not present

## 2022-06-06 DIAGNOSIS — C50411 Malignant neoplasm of upper-outer quadrant of right female breast: Secondary | ICD-10-CM | POA: Diagnosis not present

## 2022-06-06 DIAGNOSIS — Z170 Estrogen receptor positive status [ER+]: Secondary | ICD-10-CM | POA: Diagnosis not present

## 2022-06-06 MED ORDER — VEOZAH 45 MG PO TABS: 1.0000 | ORAL_TABLET | Freq: Every day | ORAL | 6 refills | Status: DC

## 2022-06-06 NOTE — Assessment & Plan Note (Addendum)
06/27/2019: Right lumpectomy [name]): IDC, grade 3, at least 2.5cm, involving a complex sclerosing lesion, 1.1cm, with high grade DCIS, involved margins, 1/3 right axillary lymph nodes positive, 1.4cm.  ER 100%, PR 30%, Ki-67 20%, HER-2 IHC negative T2 N1 stage IIA MammaPrint: High risk, 10-year risk of recurrence untreated: 29% 09/08/2019:Re-excision x2 (08/01/19 and 08/12/19) followed by right mastectomy [name]): residual carcinoma and DCIS, 1/5 lymph nodes positive, clear margins.    Treatment plan: 1. Adjuvant chemotherapy with Taxotere and Cytoxan every 3 weeks x4 completed 12/15/2019 2.  Adjuvant radiation therapy completed 03/16/2020 3.  Follow-up adjuvant antiestrogen therapy with anastrozole 1 mg daily x7 years Genetic testing ------------------------------------------------------------------------------------------------------ Treatment plan: Plan for adjuvant antiestrogen therapy with anastrozole x7 years started September 2021   Bilateral lower extremity swelling: 01/26/2020: Echocardiogram EF 60 to 65% 01/21/2020: Ultrasound lower extremity: No DVT Continue with Lasix as needed   Chemo-induced peripheral neuropathy:  Anastrozole Toxicities: Hot flashes.  Sent prescription for [employer].   Severe pain in the reconstructed right breast: Capsular contracture.   She had revision reconstruction at [facility] and a reduction.  She is doing much better.  Small area of fat necrosis was present   Breast Cancer Surveillance: Mammogram:  At Dr. Kennith Maes office Bone density: 09/12/2020: T score -1.2: Mild osteopenia: Calcium and vitamin D Breast exam 06/06/2022: Benign  Return to clinic in 1 year for follow-up

## 2022-06-06 NOTE — Telephone Encounter (Signed)
Notified Patient of prior authorization approval for Veozah '45mg'$  Tablets. Medication is approved 06/06/2022 through 06/07/2023. No other needs or concerns voiced at this time.

## 2022-07-25 DIAGNOSIS — R87610 Atypical squamous cells of undetermined significance on cytologic smear of cervix (ASC-US): Secondary | ICD-10-CM | POA: Insufficient documentation

## 2022-07-25 DIAGNOSIS — R87810 Cervical high risk human papillomavirus (HPV) DNA test positive: Secondary | ICD-10-CM | POA: Insufficient documentation

## 2022-07-30 LAB — HM PAP SMEAR
HPV 16/18/45 genotyping: NEGATIVE
HPV, high-risk: POSITIVE

## 2022-08-13 ENCOUNTER — Ambulatory Visit: Payer: No Typology Code available for payment source | Admitting: Family Medicine

## 2022-08-13 ENCOUNTER — Encounter: Payer: Self-pay | Admitting: Family Medicine

## 2022-08-13 VITALS — BP 122/72 | HR 78 | Temp 99.2°F | Ht 63.0 in | Wt 218.2 lb

## 2022-08-13 DIAGNOSIS — U071 COVID-19: Secondary | ICD-10-CM

## 2022-08-13 DIAGNOSIS — R051 Acute cough: Secondary | ICD-10-CM

## 2022-08-13 LAB — POCT INFLUENZA A/B
Influenza A, POC: NEGATIVE
Influenza B, POC: NEGATIVE

## 2022-08-13 LAB — POC COVID19 BINAXNOW: SARS Coronavirus 2 Ag: POSITIVE — AB

## 2022-08-13 MED ORDER — BENZONATATE 100 MG PO CAPS: 100.0000 mg | ORAL_CAPSULE | Freq: Three times a day (TID) | ORAL | 0 refills | Status: DC | PRN

## 2022-08-13 NOTE — Progress Notes (Signed)
Subjective:  Patient ID: Wendy [name], female    DOB: 05/16/1956  Age: 67 y.o. MRN: [id number]  CC:  Chief Complaint  Patient presents with   Cough    Pt notes cough, sinus drainage, congestion, sore throat, Chills, home COVID test Monday was negative     HPI ANITRA DOXTATER presents for   Cough: Initial sore throat 2 days ago. As above, cough, sinus drainage, congestion, sore throat and chills.  Body aches yesterday. Home test for COVID was negative 2 days ago. Sore with cough, no chest pain or dyspnea.  Drinking fluids, no confusion.  Medical hx discussed and antiviral discussed with r/b. As feeling better today, she declines antiviral.  Tx: tylenol, cold and flu - helps some.  Min cough at night.   History Patient Active Problem List   Diagnosis Date Noted   S/P breast reconstruction, right 01/23/2020   History of reduction surgery of left breast 01/23/2020   Port-A-Cath in place 10/20/2019   S/P mastectomy, right 10/19/2019   Acquired absence of breast 09/16/2019   Breast cancer ([place]) 09/08/2019   Genetic testing 06/10/2019   Malignant neoplasm of upper-outer quadrant of right breast in female, estrogen receptor positive ([place]) 06/01/2019   Family history of breast cancer 06/01/2019   Family history of colon cancer 06/01/2019   Family history of brain cancer 06/01/2019   History of colon cancer    Abnormal colonoscopy 10/30/2017   Chronic glaucoma 41/32/4401   HELICOBACTER PYLORI INFECTION 08/27/2007   GERD 08/27/2007   ESOPHAGITIS, REFLUX 05/17/2007   HEMORRHOIDS, INTERNAL 09/22/2003   Past Medical History:  Diagnosis Date   Breast cancer ([place])    Colon cancer ([place])    DDD (degenerative disc disease), cervical    DDD (degenerative disc disease), lumbar    ESOPHAGITIS, REFLUX    Glaucoma    HELICOBACTER PYLORI INFECTION    HEMORRHOIDS, INTERNAL    History of colon cancer    History of colon polyps 2019   Hyperlipidemia    Radius fracture 2006   Left,  Distal   Thoracic spondylosis    Tinnitus, bilateral 2013   Past Surgical History:  Procedure Laterality Date   BREAST LUMPECTOMY WITH RADIOACTIVE SEED AND SENTINEL LYMPH NODE BIOPSY Right 06/27/2019   Procedure: RIGHT BREAST LUMPECTOMY WITH RADIOACTIVE SEED AND SENTINEL LYMPH NODE BIOPSY AND RIGHT TARGETED NODE DISSECTION;  Surgeon: Jovita Kussmaul, MD;  Location: [place];  Service: General;  Laterality: Right;   BREAST RECONSTRUCTION WITH PLACEMENT OF TISSUE EXPANDER AND FLEX HD (ACELLULAR HYDRATED DERMIS) Right 09/08/2019   Procedure: BREAST RECONSTRUCTION WITH PLACEMENT OF TISSUE EXPANDER AND FLEX HD (ACELLULAR HYDRATED DERMIS);  Surgeon: Wallace Going, DO;  Location: [place];  Service: Plastics;  Laterality: Right;   BREAST REDUCTION WITH MASTOPEXY Left 01/02/2020   Procedure: MASTOPEXY / REDUCTION FOR SYMMETRY;  Surgeon: Wallace Going, DO;  Location: [place];  Service: Plastics;  Laterality: Left;   CESAREAN SECTION  1989   COLONOSCOPY  2019   HYSTEROSCOPY WITH D & C N/A 09/17/2018   Procedure: DILATATION AND CURETTAGE Marlene Bast;  Surgeon: Brien Few, MD;  Location: [place];  Service: Gynecology;  Laterality: N/A;   PORT-A-CATH REMOVAL Left 01/02/2020   Procedure: REMOVAL PORT-A-CATH;  Surgeon: Wallace Going, DO;  Location: [place];  Service: Plastics;  Laterality: Left;   PORTACATH PLACEMENT Left 08/01/2019   Procedure: INSERTION PORT-A-CATH;  Surgeon: Autumn Messing  III, MD;  Location: [place];  Service: General;  Laterality: Left;   RE-EXCISION OF BREAST CANCER,SUPERIOR MARGINS Right 08/12/2019   Procedure: RE-EXCISION OF RIGHT BREAST,SUPERIOR MARGINS;  Surgeon: Jovita Kussmaul, MD;  Location: [place];  Service: General;  Laterality: Right;   RE-EXCISION OF BREAST LUMPECTOMY Right 08/01/2019   Procedure: RE-EXCISION OF RIGHT  BREAST CANCER  MARGINS;  Surgeon: Jovita Kussmaul, MD;  Location: [place];  Service: General;  Laterality: Right;   REMOVAL OF TISSUE EXPANDER AND PLACEMENT OF IMPLANT Right 01/02/2020   Procedure: REMOVAL OF TISSUE EXPANDER AND PLACEMENT OF IMPLANT;  Surgeon: Wallace Going, DO;  Location: [place];  Service: Plastics;  Laterality: Right;   SIMPLE MASTECTOMY WITH AXILLARY SENTINEL NODE BIOPSY Right 09/08/2019   Procedure: RIGHT BREAST MASTECTOMY;  Surgeon: Jovita Kussmaul, MD;  Location: [place];  Service: General;  Laterality: Right;   TONSILLECTOMY     age 36 years old   TRIGGER FINGER RELEASE Right 11/01/2021   WRIST FRACTURE SURGERY  2006   Allergies  Allergen Reactions   Hydrocodone-Acetaminophen Itching and Other (See Comments)    SEVERE     Oxycodone Itching   Other Other (See Comments)    rakuten eyedrops causes eye redness    Vicodin [Hydrocodone-Acetaminophen] Itching    SEVERE    Brimonidine Rash   Prior to Admission medications   Medication Sig Start Date End Date Taking? Authorizing Provider  anastrozole (ARIMIDEX) 1 MG tablet TAKE 1 TABLET BY MOUTH EVERY DAY 04/25/22  Yes Nicholas Lose, MD  dorzolamide-timolol (COSOPT) 22.3-6.8 MG/ML ophthalmic solution PLACE 1 DROP INTO BOTH EYES 2 (TWO) TIMES DAILY. 09/28/13  Yes [provider]  furosemide (LASIX) 20 MG tablet TAKE 1 TABLET BY MOUTH TWICE A DAY 12/20/21  Yes Nicholas Lose, MD  gabapentin (NEURONTIN) 300 MG capsule TAKE 1 CAPSULE BY MOUTH TWICE A DAY 11/15/21  Yes Nicholas Lose, MD  latanoprost (XALATAN) 0.005 % ophthalmic solution 1 drop at bedtime.    Yes [provider]  venlafaxine XR (EFFEXOR-XR) 75 MG 24 hr capsule TAKE 1 CAPSULE BY MOUTH DAILY WITH BREAKFAST. 05/08/22  Yes Nicholas Lose, MD  VEOZAH 45 MG TABS TAKE 1 TABLET BY MOUTH EVERY DAY 06/06/22  Yes Nicholas Lose, MD  prochlorperazine (COMPAZINE) 10 MG tablet Take 1 tablet (10 mg total)  by mouth every 6 (six) hours as needed (Nausea or vomiting). Patient not taking: Reported on 01/10/2020 07/22/19 01/19/20  Nicholas Lose, MD   Social History   Socioeconomic History   Marital status: Divorced    Spouse name: Not on file   Number of children: Not on file   Years of education: Not on file   Highest education level: Not on file  Occupational History   Occupation: medical    Employer: [employer]  Tobacco Use   Smoking status: Never   Smokeless tobacco: Never  Substance and Sexual Activity   Alcohol use: Yes    Comment: occ   Drug use: No   Sexual activity: Not Currently  Other Topics Concern   Not on file  Social History Narrative   Not on file   Social Determinants of Health   Financial Resource Strain: Not on file  Food Insecurity: Not on file  Transportation Needs: Not on file  Physical Activity: Not on file  Stress: Not on file  Social Connections: Not on file  Intimate Partner Violence: Not on file  Review of Systems   Objective:   Vitals:   08/13/22 1140  BP: 122/72  Pulse: 78  Temp: 99.2 F (37.3 C)  TempSrc: Temporal  SpO2: 97%  Weight: 218 lb 3.2 oz (99 kg)  Height: '5\' 3"'$  (1.6 m)     Physical Exam Vitals reviewed.  Constitutional:      General: She is not in acute distress.    Appearance: She is well-developed.  HENT:     Head: Normocephalic and atraumatic.     Right Ear: Hearing, tympanic membrane, ear canal and external ear normal.     Left Ear: Hearing, tympanic membrane, ear canal and external ear normal.     Nose: Nose normal.     Mouth/Throat:     Pharynx: No posterior oropharyngeal erythema.  Eyes:     Conjunctiva/sclera: Conjunctivae normal.     Pupils: Pupils are equal, round, and reactive to light.  Cardiovascular:     Rate and Rhythm: Normal rate and regular rhythm.     Heart sounds: Normal heart sounds. No murmur heard. Pulmonary:     Effort: Pulmonary effort is normal. No respiratory distress.      Breath sounds: Normal breath sounds. No wheezing or rhonchi.  Skin:    General: Skin is warm and dry.     Findings: No rash.  Neurological:     Mental Status: She is alert and oriented to person, place, and time.  Psychiatric:        Mood and Affect: Mood normal.        Behavior: Behavior normal.      Results for orders placed or performed in visit on 08/13/22  POCT Influenza A/B  Result Value Ref Range   Influenza A, POC Negative Negative   Influenza B, POC Negative Negative  POC COVID-19 BinaxNow  Result Value Ref Range   SARS Coronavirus 2 Ag Positive (A) Negative     Assessment & Plan:  DALLAS SCORSONE is a 67 y.o. female . Acute cough - Plan: POCT Influenza A/B, POC COVID-19 BinaxNow, benzonatate (TESSALON) 100 MG capsule  COVID-19 virus infection - Plan: benzonatate (TESSALON) 100 MG capsule COVID-19 infection, day 2 of symptoms.  No red flags on exam or history, symptoms have improved today.  We discussed risks, benefits, side effects and risk of rebound COVID with antiviral as well as possible interaction with medications.  Discussed this in the context of her medical history as well.  At this time she has declined antiviral but we do have up to day 5.  ER/urgent care precautions given.  Tessalon Perles if needed for cough, symptomatic care discussed.  Meds ordered this encounter  Medications   benzonatate (TESSALON) 100 MG capsule    Sig: Take 1 capsule (100 mg total) by mouth 3 (three) times daily as needed for cough.    Dispense:  20 capsule    Refill:  0   Patient Instructions  Mucinex or tessalon for cough.  Fluids, rest. Tylenol if needed for body aches and fever as needed.  Let me know if you would like me to prescribe the antiviral and I will need to make sure there are no interactions with your medications.  As we discussed we have until day 5 to start that medicine if you chose to do so.  Glad to hear you are feeling better today.  Continue rest and let  me know if you have any questions.  Hang in there!  Your employer may have specific requirements  for return to work, but this information is provided from the CDC. There is a link provided below for more information if needed.   Everyone who has presumed or confirmed COVID-19 should stay home and isolate from other people for at least 5 full days (day 0 is the first day of symptoms or the date of the day of the positive viral test for asymptomatic persons). You can end isolation after 5 full days if you are fever-free for 24 hours without the use of fever-reducing medication and your other symptoms have improved (Loss of taste and smell may persist for weeks or months after recovery and need not delay the end of isolation). You should continue to wear a well-fitting mask around others at home and in public for 5 additional days (day 6 through day 10) after the end of your 5-day isolation period. If you are unable to wear a mask when around others, you should continue to isolate for a full 10 days. Avoid people who have weakened immune systems or are more likely to get very sick from COVID-19, and nursing homes and other high-risk settings, until after at least 10 days.  If you continue to have fever or your other symptoms have not improved after 5 days of isolation, you should wait to end your isolation until you are fever-free for 24 hours without the use of fever-reducing medication and your other symptoms have improved. Continue to wear a well-fitting mask through day 10.   [email or web address].[email or web address]     Signed,   [name], MD [place], [facility] 08/13/22 12:31 PM

## 2022-08-13 NOTE — Patient Instructions (Addendum)
Mucinex or tessalon for cough.  Fluids, rest. Tylenol if needed for body aches and fever as needed.  Let me know if you would like me to prescribe the antiviral and I will need to make sure there are no interactions with your medications.  As we discussed we have until day 5 to start that medicine if you chose to do so.  Glad to hear you are feeling better today.  Continue rest and let me know if you have any questions.  Hang in there!  Your employer may have specific requirements for return to work, but this information is provided from the CDC. There is a link provided below for more information if needed.   Everyone who has presumed or confirmed COVID-19 should stay home and isolate from other people for at least 5 full days (day 0 is the first day of symptoms or the date of the day of the positive viral test for asymptomatic persons). You can end isolation after 5 full days if you are fever-free for 24 hours without the use of fever-reducing medication and your other symptoms have improved (Loss of taste and smell may persist for weeks or months after recovery and need not delay the end of isolation). You should continue to wear a well-fitting mask around others at home and in public for 5 additional days (day 6 through day 10) after the end of your 5-day isolation period. If you are unable to wear a mask when around others, you should continue to isolate for a full 10 days. Avoid people who have weakened immune systems or are more likely to get very sick from COVID-19, and nursing homes and other high-risk settings, until after at least 10 days.  If you continue to have fever or your other symptoms have not improved after 5 days of isolation, you should wait to end your isolation until you are fever-free for 24 hours without the use of fever-reducing medication and your other symptoms have improved. Continue to wear a well-fitting mask through day 10.    https://brown.org/.html

## 2022-08-15 ENCOUNTER — Ambulatory Visit: Payer: No Typology Code available for payment source | Admitting: Hematology and Oncology

## 2022-08-25 ENCOUNTER — Telehealth: Payer: Self-pay | Admitting: Family Medicine

## 2022-08-25 ENCOUNTER — Encounter: Payer: Self-pay | Admitting: Family Medicine

## 2022-08-25 NOTE — Telephone Encounter (Signed)
Caller name: KARELYN OAS  On DPR?: Yes  Call back number: [phone] (mobile)  Provider they see: [name], MD  Reason for call: Post Covid congestion and cough advise

## 2022-08-25 NOTE — Telephone Encounter (Signed)
Pt is asking for advice on the lingering cough from having COVID recently anything to advise?

## 2022-08-26 MED ORDER — PROMETHAZINE-DM 6.25-15 MG/5ML PO SYRP: 2.5000 mL | ORAL_SOLUTION | Freq: Four times a day (QID) | ORAL | 0 refills | Status: DC | PRN

## 2022-08-26 NOTE — Telephone Encounter (Signed)
Last visit 13 days ago for COVID, prescribed Tessalon Perles at that time.  Is she taking Tessalon?  If she was and is out of that medicine was it helping?  Mucinex DM may also be helpful, or if just some residual but improving cough, can also send in a cough syrup if that is primarily an issue at night.  If any new or worsening symptoms should be evaluated to make sure there is not a secondary infection.  Let me know and I can adjust or send in new meds if needed.  Thanks.

## 2022-08-26 NOTE — Telephone Encounter (Signed)
Pt Is improving but cough has lingered, Tessalon was not helpful, has been taking Mucinex, notes not necessarily night time issues seems pretty consistent throughout the day please advise

## 2022-08-26 NOTE — Telephone Encounter (Signed)
Caller states Wendy [name] she left a message this morning and didn't hear back. She has symptoms of covid. Additional Comment Provided office hours. Caller declined triage Disp. Time Disposition Final User 08/25/2022 4:35:01 PM General Information Provided Yes Wendy [name] Call Closed By: Wendy [name] Transaction Date/Time: 08/25/2022 4:32:17 PM (ET).

## 2022-08-26 NOTE — Telephone Encounter (Signed)
Unfortunately cough can be one of the last symptoms to improve.  Mucinex DM has a bit of a cough blocker if she is just taking plain Mucinex.  I can also write for some Phenergan cough syrup that can help block cough but that can be very sedating, so typically use that at night.  If any fever, shortness of breath or worsening cough should be seen and possible x-ray.  Let me know if she would like to try that cough syrup or just Mucinex DM first.  Happy to see her in the next few days if needed as well.

## 2022-08-26 NOTE — Addendum Note (Signed)
Addended by: [name] R on: 08/26/2022 12:04 PM   Modules accepted: Orders

## 2022-08-26 NOTE — Telephone Encounter (Signed)
Pt also sent a mychart message please address this when you have the chance today

## 2022-08-26 NOTE — Telephone Encounter (Signed)
Okay to send cough syrup

## 2022-08-26 NOTE — Telephone Encounter (Signed)
Phenergan DM sent.

## 2022-09-11 ENCOUNTER — Other Ambulatory Visit: Payer: Self-pay | Admitting: Hematology and Oncology

## 2022-09-11 DIAGNOSIS — Z170 Estrogen receptor positive status [ER+]: Secondary | ICD-10-CM

## 2022-09-16 ENCOUNTER — Encounter: Payer: Self-pay | Admitting: Family Medicine

## 2022-09-16 ENCOUNTER — Telehealth: Payer: Self-pay

## 2022-09-16 NOTE — Telephone Encounter (Signed)
Please see that patient's chart. I have already responded to this request last month. Does not look like appointment has been scheduled yet. Thanks.

## 2022-09-16 NOTE — Telephone Encounter (Signed)
Pt son OKAY to be scheduled as a new patient with Dr Carlota Raspberry

## 2022-09-16 NOTE — Telephone Encounter (Signed)
Pt is asking if you would tak on her son as new Pt Wendy [name] 07/05/1997

## 2022-11-07 ENCOUNTER — Encounter: Payer: No Typology Code available for payment source | Admitting: Family Medicine

## 2022-11-10 ENCOUNTER — Encounter: Payer: Self-pay | Admitting: Family Medicine

## 2022-11-10 ENCOUNTER — Ambulatory Visit (INDEPENDENT_AMBULATORY_CARE_PROVIDER_SITE_OTHER): Payer: No Typology Code available for payment source | Admitting: Family Medicine

## 2022-11-10 VITALS — BP 122/82 | HR 72 | Temp 98.9°F | Ht 63.0 in | Wt 214.8 lb

## 2022-11-10 DIAGNOSIS — Z23 Encounter for immunization: Secondary | ICD-10-CM | POA: Diagnosis not present

## 2022-11-10 DIAGNOSIS — R0789 Other chest pain: Secondary | ICD-10-CM | POA: Diagnosis not present

## 2022-11-10 DIAGNOSIS — R600 Localized edema: Secondary | ICD-10-CM

## 2022-11-10 DIAGNOSIS — E669 Obesity, unspecified: Secondary | ICD-10-CM

## 2022-11-10 DIAGNOSIS — Z0001 Encounter for general adult medical examination with abnormal findings: Secondary | ICD-10-CM | POA: Diagnosis not present

## 2022-11-10 DIAGNOSIS — Z131 Encounter for screening for diabetes mellitus: Secondary | ICD-10-CM | POA: Diagnosis not present

## 2022-11-10 DIAGNOSIS — Z9011 Acquired absence of right breast and nipple: Secondary | ICD-10-CM

## 2022-11-10 DIAGNOSIS — E785 Hyperlipidemia, unspecified: Secondary | ICD-10-CM

## 2022-11-10 DIAGNOSIS — Z6838 Body mass index (BMI) 38.0-38.9, adult: Secondary | ICD-10-CM

## 2022-11-10 DIAGNOSIS — Z0000 Encounter for general adult medical examination without abnormal findings: Secondary | ICD-10-CM

## 2022-11-10 LAB — COMPREHENSIVE METABOLIC PANEL
ALT: 12 U/L (ref 0–35)
AST: 21 U/L (ref 0–37)
Albumin: 4.3 g/dL (ref 3.5–5.2)
Alkaline Phosphatase: 104 U/L (ref 39–117)
BUN: 10 mg/dL (ref 6–23)
CO2: 26 mEq/L (ref 19–32)
Calcium: 9.9 mg/dL (ref 8.4–10.5)
Chloride: 104 mEq/L (ref 96–112)
Creatinine, Ser: 0.72 mg/dL (ref 0.40–1.20)
GFR: 88.01 mL/min (ref >60.00)
Glucose, Bld: 86 mg/dL (ref 70–99)
Potassium: 4.7 mEq/L (ref 3.5–5.1)
Sodium: 141 mEq/L (ref 135–145)
Total Bilirubin: 0.7 mg/dL (ref 0.2–1.2)
Total Protein: 7.8 g/dL (ref 6.0–8.3)

## 2022-11-10 LAB — CBC
HCT: 46.9 % — ABNORMAL HIGH (ref 36.0–46.0)
Hemoglobin: 15.1 g/dL — ABNORMAL HIGH (ref 12.0–15.0)
MCHC: 32.2 g/dL (ref 30.0–36.0)
MCV: 86.2 fl (ref 78.0–100.0)
Platelets: 180 10*3/uL (ref 150.0–400.0)
RBC: 5.44 Mil/uL — ABNORMAL HIGH (ref 3.87–5.11)
RDW: 14.1 % (ref 11.5–15.5)
WBC: 6.9 10*3/uL (ref 4.0–10.5)

## 2022-11-10 LAB — LIPID PANEL
Cholesterol: 230 mg/dL — ABNORMAL HIGH (ref 0–200)
HDL: 70.8 mg/dL (ref >39.00)
LDL Cholesterol: 143 mg/dL — ABNORMAL HIGH (ref 0–99)
NonHDL: 159.59
Total CHOL/HDL Ratio: 3
Triglycerides: 85 mg/dL (ref 0.0–149.0)
VLDL: 17 mg/dL (ref 0.0–40.0)

## 2022-11-10 LAB — HEMOGLOBIN A1C: Hgb A1c MFr Bld: 6.3 % (ref 4.6–6.5)

## 2022-11-10 LAB — TSH: TSH: 1.06 u[IU]/mL (ref 0.35–5.50)

## 2022-11-10 NOTE — Patient Instructions (Addendum)
I do recommend discussing the chest wall soreness with your surgeon. I will check labs for swelling. 1/2 pill of furosemide may help if needed for swelling. See info below.  Return to the clinic or go to the nearest emergency room if any of your symptoms worsen or new symptoms occur.  I will check labs and let you know if there are any concerns.  If any worsening swelling or new symptoms please be seen.  Recheck in 6 months for repeat shingles vaccine at that time.   Peripheral Edema  Peripheral edema is swelling that is caused by a buildup of fluid. Peripheral edema most often affects the lower legs, ankles, and feet. It can also develop in the arms, hands, and face. The area of the body that has peripheral edema will look swollen. It may also feel heavy or warm. Your clothes may start to feel tight. Pressing on the area may make a temporary dent in your skin (pitting edema). You may not be able to move your swollen arm or leg as much as usual. There are many causes of peripheral edema. It can happen because of a complication of other conditions such as heart failure, kidney disease, or a problem with your circulation. It also can be a side effect of certain medicines or happen because of an infection. It often happens to women during pregnancy. Sometimes, the cause is not known. Follow these instructions at home: Managing pain, stiffness, and swelling  Raise (elevate) your legs while you are sitting or lying down. Move around often to prevent stiffness and to reduce swelling. Do not sit or stand for long periods of time. Do not wear tight clothing. Do not wear garters on your upper legs. Exercise your legs to get your circulation going. This helps to move the fluid back into your blood vessels, and it may help the swelling go down. Wear compression stockings as told by your health care provider. These stockings help to prevent blood clots and reduce swelling in your legs. It is important that  these are the correct size. These stockings should be prescribed by your doctor to prevent possible injuries. If elastic bandages or wraps are recommended, use them as told by your health care provider. Medicines Take over-the-counter and prescription medicines only as told by your health care provider. Your health care provider may prescribe medicine to help your body get rid of excess water (diuretic). Take this medicine if you are told to take it. General instructions Eat a low-salt (low-sodium) diet as told by your health care provider. Sometimes, eating less salt may reduce swelling. Pay attention to any changes in your symptoms. Moisturize your skin daily to help prevent skin from cracking and draining. Keep all follow-up visits. This is important. Contact a health care provider if: You have a fever. You have swelling in only one leg. You have increased swelling, redness, or pain in one or both of your legs. You have drainage or sores at the area where you have edema. Get help right away if: You have edema that starts suddenly or is getting worse, especially if you are pregnant or have a medical condition. You develop shortness of breath, especially when you are lying down. You have pain in your chest or abdomen. You feel weak. You feel like you will faint. These symptoms may be an emergency. Get help right away. Call 911. Do not wait to see if the symptoms will go away. Do not drive yourself to the hospital. Summary  Peripheral edema is swelling that is caused by a buildup of fluid. Peripheral edema most often affects the lower legs, ankles, and feet. Move around often to prevent stiffness and to reduce swelling. Do not sit or stand for long periods of time. Pay attention to any changes in your symptoms. Contact a health care provider if you have edema that starts suddenly or is getting worse, especially if you are pregnant or have a medical condition. Get help right away if you  develop shortness of breath, especially when lying down. This information is not intended to replace advice given to you by your health care provider. Make sure you discuss any questions you have with your health care provider. Document Revised: 02/25/2021 Document Reviewed: 02/25/2021 Elsevier Patient Education  2023 [employer]   Preventive Care 25-78 Years Old, Female Preventive care refers to lifestyle choices and visits with your health care provider that can promote health and wellness. Preventive care visits are also called wellness exams. What can I expect for my preventive care visit? Counseling Your health care provider may ask you questions about your: Medical history, including: Past medical problems. Family medical history. Pregnancy history. Current health, including: Menstrual cycle. Method of birth control. Emotional well-being. Home life and relationship well-being. Sexual activity and sexual health. Lifestyle, including: Alcohol, nicotine or tobacco, and drug use. Access to firearms. Diet, exercise, and sleep habits. Work and work Astron[employer]. Sunscreen use. Safety issues such as seatbelt and bike helmet use. Physical exam Your health care provider will check your: Height and weight. These may be used to calculate your BMI (body mass index). BMI is a measurement that tells if you are at a healthy weight. Waist circumference. This measures the distance around your waistline. This measurement also tells if you are at a healthy weight and may help predict your risk of certain diseases, such as type 2 diabetes and high blood pressure. Heart rate and blood pressure. Body temperature. Skin for abnormal spots. What immunizations do I need?  Vaccines are usually given at various ages, according to a schedule. Your health care provider will recommend vaccines for you based on your age, medical history, and lifestyle or other factors, such as travel or where you  work. What tests do I need? Screening Your health care provider may recommend screening tests for certain conditions. This may include: Lipid and cholesterol levels. Diabetes screening. This is done by checking your blood sugar (glucose) after you have not eaten for a while (fasting). Pelvic exam and Pap test. Hepatitis B test. Hepatitis C test. HIV (human immunodeficiency virus) test. STI (sexually transmitted infection) testing, if you are at risk. Lung cancer screening. Colorectal cancer screening. Mammogram. Talk with your health care provider about when you should start having regular mammograms. This may depend on whether you have a family history of breast cancer. BRCA-related cancer screening. This may be done if you have a family history of breast, ovarian, tubal, or peritoneal cancers. Bone density scan. This is done to screen for osteoporosis. Talk with your health care provider about your test results, treatment options, and if necessary, the need for more tests. Follow these instructions at home: Eating and drinking  Eat a diet that includes fresh fruits and vegetables, whole grains, lean protein, and low-fat dairy products. Take vitamin and mineral supplements as recommended by your health care provider. Do not drink alcohol if: Your health care provider tells you not to drink. You are pregnant, may be pregnant, or are planning to become  pregnant. If you drink alcohol: Limit how much you have to 0-1 drink a day. Know how much alcohol is in your drink. In the [place] one drink equals one 12 oz bottle of beer (355 mL), one 5 oz glass of wine (148 mL), or one 1 oz glass of hard liquor (44 mL). Lifestyle Brush your teeth every morning and night with fluoride toothpaste. Floss one time each day. Exercise for at least 30 minutes 5 or more days each week. Do not use any products that contain nicotine or tobacco. These products include cigarettes, chewing tobacco, and vaping  devices, such as e-cigarettes. If you need help quitting, ask your health care provider. Do not use drugs. If you are sexually active, practice safe sex. Use a condom or other form of protection to prevent STIs. If you do not wish to become pregnant, use a form of birth control. If you plan to become pregnant, see your health care provider for a prepregnancy visit. Take aspirin only as told by your health care provider. Make sure that you understand how much to take and what form to take. Work with your health care provider to find out whether it is safe and beneficial for you to take aspirin daily. Find healthy ways to manage stress, such as: Meditation, yoga, or listening to music. Journaling. Talking to a trusted person. Spending time with friends and family. Minimize exposure to UV radiation to reduce your risk of skin cancer. Safety Always wear your seat belt while driving or riding in a vehicle. Do not drive: If you have been drinking alcohol. Do not ride with someone who has been drinking. When you are tired or distracted. While texting. If you have been using any mind-altering substances or drugs. Wear a helmet and other protective equipment during sports activities. If you have firearms in your house, make sure you follow all gun safety procedures. Seek help if you have been physically or sexually abused. What's next? Visit your health care provider once a year for an annual wellness visit. Ask your health care provider how often you should have your eyes and teeth checked. Stay up to date on all vaccines. This information is not intended to replace advice given to you by your health care provider. Make sure you discuss any questions you have with your health care provider. Document Revised: 12/19/2020 Document Reviewed: 12/19/2020 Elsevier Patient Education  2023 [employer].

## 2022-11-10 NOTE — Progress Notes (Unsigned)
Subjective:  Patient ID: Wendy [name], female    DOB: 1955/08/17  Age: 67 y.o. MRN: [id number]  CC:  Chief Complaint  Patient presents with   Chest Pain    Right side chest pain and tightness after mastectomy  Edema in right arm and both ankles     HPI Wendy [name] presents for Annual Exam  Initially presented for physical today, but with symptoms above as well.   Right-sided chest pain Notes continued pain -  soreness since her mastectomy, lateral R breast/chest area, toward axilla. Daily, no recent swelling or skin redness.  Right breast surgery, May 2023 for asymmetry  - capsular contracture after breast reconstruction, history of right mastectomy March 2021 -DCIS.  Chemotherapy, radiation therapy, then antiestrogen oral therapy with anastrozole.  Has not discussed with Dr. Wyline Mood.  No fever/dyspnea or other new symptoms with chest pain. No rash.  On gabapentin for neuropathy form chemo - stable.   also reports edema in both ankles. Chronic issue, R more than left. No calf pain. No dyspnea. No hx of DVT. No recent changes. Has furosemied if needed form oncology - only uses once per month for R arm or hand swelling typically. Arm swelling thought to be due to mastectomy. Uses lymphedema pump daily. Normal EF on 2021 echo, and no DVT on ultrasound in 2021.  Min salt in diet - avoids processed foods.   On veozah for hot flushes, worked well, but not voered by insurance - will be changing back to effexor.     11/10/2022    9:18 AM 08/13/2022   11:38 AM 05/16/2022    2:36 PM 06/27/2021    3:50 PM 05/02/2021   11:05 AM  Depression screen PHQ 2/9  Decreased Interest 0 0 0 0 0  Down, Depressed, Hopeless 0 0 0 0 0  PHQ - 2 Score 0 0 0 0 0  Altered sleeping 0  0 0   Tired, decreased energy 0  0 0   Change in appetite 0  0 0   Feeling bad or failure about yourself  0  0 0   Trouble concentrating 0  0 0   Moving slowly or fidgety/restless 0  0 0   Suicidal thoughts 0  0 0    PHQ-9 Score 0  0 0   Difficult doing work/chores Not difficult at all  Not difficult at all Not difficult at all     Health Maintenance  Topic Date Due   Zoster Vaccines- Shingrix (1 of 2) Never done   INFLUENZA VACCINE  02/05/2023   MAMMOGRAM  11/07/2023   PAP SMEAR-Modifier  07/30/2025   DTaP/Tdap/Td (2 - Td or Tdap) 09/01/2027   COLONOSCOPY (Pts 45-69yrs Insurance coverage will need to be confirmed)  10/31/2027   Hepatitis C Screening  Completed   HIV Screening  Completed   HPV VACCINES  Aged Out   COVID-19 Vaccine  Discontinued  Colonoscopy 2019 - again in 2021?  [facility].  Mammogram 11/2021 - scheduled this Friday.  Dr. [facility] - UTD on pap testing.   Immunization History  Administered Date(s) Administered   Influenza Split 04/06/2012   Influenza,inj,Quad PF,6+ Mos 08/31/2017, 04/30/2018, 05/13/2019   Influenza-Unspecified 04/18/2015   PFIZER Comirnaty(Gray Top)Covid-19 Tri-Sucrose Vaccine 01/01/2021   PFIZER(Purple Top)SARS-COV-2 Vaccination 09/20/2019, 10/11/2019   Tdap 08/31/2017  Covid booster and flu vaccine this fall.  RSV vaccine declined.  Shingrix - initial today.   No results found. Regular optho care - glaucoma -  latanoprost and cosopt.   Dental: every 6 months  Alcohol: rare  Tobacco: none  Exercise/obesity.  Body mass index is 38.05 kg/m. Walking 3 days per week. 15 min. Commended on weight loss.  Wt Readings from Last 3 Encounters:  11/10/22 214 lb 12.8 oz (97.4 kg)  08/13/22 218 lb 3.2 oz (99 kg)  06/06/22 222 lb 4.8 oz (100.8 kg)     History Patient Active Problem List   Diagnosis Date Noted   S/P breast reconstruction, right 01/23/2020   History of reduction surgery of left breast 01/23/2020   Port-A-Cath in place 10/20/2019   S/P mastectomy, right 10/19/2019   Acquired absence of breast 09/16/2019   Breast cancer (HCC) 09/08/2019   Genetic testing 06/10/2019   Malignant neoplasm of upper-outer quadrant of right breast in female,  estrogen receptor positive (HCC) 06/01/2019   Family history of breast cancer 06/01/2019   Family history of colon cancer 06/01/2019   Family history of brain cancer 06/01/2019   History of colon cancer    Abnormal colonoscopy 10/30/2017   Chronic glaucoma 08/11/2013   HELICOBACTER PYLORI INFECTION 08/27/2007   GERD 08/27/2007   ESOPHAGITIS, REFLUX 05/17/2007   HEMORRHOIDS, INTERNAL 09/22/2003   Past Medical History:  Diagnosis Date   Breast cancer (HCC)    Colon cancer (HCC)    DDD (degenerative disc disease), cervical    DDD (degenerative disc disease), lumbar    ESOPHAGITIS, REFLUX    Glaucoma    HELICOBACTER PYLORI INFECTION    HEMORRHOIDS, INTERNAL    History of colon cancer    History of colon polyps 2019   Hyperlipidemia    Radius fracture 2006   Left, Distal   Thoracic spondylosis    Tinnitus, bilateral 2013   Past Surgical History:  Procedure Laterality Date   BREAST LUMPECTOMY WITH RADIOACTIVE SEED AND SENTINEL LYMPH NODE BIOPSY Right 06/27/2019   Procedure: RIGHT BREAST LUMPECTOMY WITH RADIOACTIVE SEED AND SENTINEL LYMPH NODE BIOPSY AND RIGHT TARGETED NODE DISSECTION;  Surgeon: Griselda Miner, MD;  Location: [facility];  Service: General;  Laterality: Right;   BREAST RECONSTRUCTION WITH PLACEMENT OF TISSUE EXPANDER AND FLEX HD (ACELLULAR HYDRATED DERMIS) Right 09/08/2019   Procedure: BREAST RECONSTRUCTION WITH PLACEMENT OF TISSUE EXPANDER AND FLEX HD (ACELLULAR HYDRATED DERMIS);  Surgeon: Peggye Form, DO;  Location: [facility];  Service: Plastics;  Laterality: Right;   BREAST REDUCTION WITH MASTOPEXY Left 01/02/2020   Procedure: MASTOPEXY / REDUCTION FOR SYMMETRY;  Surgeon: Peggye Form, DO;  Location: [facility];  Service: Plastics;  Laterality: Left;   CESAREAN SECTION  1989   COLONOSCOPY  2019   HYSTEROSCOPY WITH D & C N/A 09/17/2018   Procedure: DILATATION AND CURETTAGE Wendy [name];   Surgeon: Olivia Mackie, MD;  Location: [facility];  Service: Gynecology;  Laterality: N/A;   PORT-A-CATH REMOVAL Left 01/02/2020   Procedure: REMOVAL PORT-A-CATH;  Surgeon: Peggye Form, DO;  Location: [facility];  Service: Plastics;  Laterality: Left;   PORTACATH PLACEMENT Left 08/01/2019   Procedure: INSERTION PORT-A-CATH;  Surgeon: Griselda Miner, MD;  Location: [facility];  Service: General;  Laterality: Left;   RE-EXCISION OF BREAST CANCER,SUPERIOR MARGINS Right 08/12/2019   Procedure: RE-EXCISION OF RIGHT BREAST,SUPERIOR MARGINS;  Surgeon: Griselda Miner, MD;  Location: [facility];  Service: General;  Laterality: Right;   RE-EXCISION OF BREAST LUMPECTOMY Right 08/01/2019   Procedure: RE-EXCISION OF RIGHT BREAST CANCER  MARGINS;  Surgeon: Griselda Miner, MD;  Location: [facility];  Service: General;  Laterality: Right;   REMOVAL OF TISSUE EXPANDER AND PLACEMENT OF IMPLANT Right 01/02/2020   Procedure: REMOVAL OF TISSUE EXPANDER AND PLACEMENT OF IMPLANT;  Surgeon: Peggye Form, DO;  Location: [facility];  Service: Plastics;  Laterality: Right;   SIMPLE MASTECTOMY WITH AXILLARY SENTINEL NODE BIOPSY Right 09/08/2019   Procedure: RIGHT BREAST MASTECTOMY;  Surgeon: Griselda Miner, MD;  Location: [facility];  Service: General;  Laterality: Right;   TONSILLECTOMY     age 60 years old   TRIGGER FINGER RELEASE Right 11/01/2021   WRIST FRACTURE SURGERY  2006   Allergies  Allergen Reactions   Hydrocodone-Acetaminophen Itching and Other (See Comments)    SEVERE     Oxycodone Itching   Other Other (See Comments)    rakuten eyedrops causes eye redness    Vicodin [Hydrocodone-Acetaminophen] Itching    SEVERE    Brimonidine Rash   Prior to Admission medications   Medication Sig Start Date End Date Taking? Authorizing Provider  anastrozole (ARIMIDEX) 1 MG tablet TAKE 1  TABLET BY MOUTH EVERY DAY 04/25/22  Yes Serena Croissant, MD  benzonatate (TESSALON) 100 MG capsule Take 1 capsule (100 mg total) by mouth 3 (three) times daily as needed for cough. 08/13/22  Yes Shade Flood, MD  dorzolamide-timolol (COSOPT) 22.3-6.8 MG/ML ophthalmic solution PLACE 1 DROP INTO BOTH EYES 2 (TWO) TIMES DAILY. 09/28/13  Yes [provider]  furosemide (LASIX) 20 MG tablet TAKE 1 TABLET BY MOUTH TWICE A DAY 12/20/21  Yes Serena Croissant, MD  gabapentin (NEURONTIN) 300 MG capsule TAKE 1 CAPSULE BY MOUTH TWICE A DAY 11/15/21  Yes Serena Croissant, MD  latanoprost (XALATAN) 0.005 % ophthalmic solution 1 drop at bedtime.    Yes [provider]  promethazine-dextromethorphan (PROMETHAZINE-DM) 6.25-15 MG/5ML syrup Take 2.5-5 mLs by mouth 4 (four) times daily as needed for cough. 08/26/22  Yes Shade Flood, MD  venlafaxine XR (EFFEXOR-XR) 75 MG 24 hr capsule TAKE 1 CAPSULE BY MOUTH DAILY WITH BREAKFAST. 09/11/22  Yes Serena Croissant, MD  VEOZAH 45 MG TABS TAKE 1 TABLET BY MOUTH EVERY DAY 06/06/22  Yes Serena Croissant, MD  prochlorperazine (COMPAZINE) 10 MG tablet Take 1 tablet (10 mg total) by mouth every 6 (six) hours as needed (Nausea or vomiting). Patient not taking: Reported on 01/10/2020 07/22/19 01/19/20  Serena Croissant, MD   Social History   Socioeconomic History   Marital status: Divorced    Spouse name: Not on file   Number of children: Not on file   Years of education: Not on file   Highest education level: Not on file  Occupational History   Occupation: medical    Employer: [employer]  Tobacco Use   Smoking status: Never   Smokeless tobacco: Never  Substance and Sexual Activity   Alcohol use: Yes    Comment: occ   Drug use: No   Sexual activity: Not Currently  Other Topics Concern   Not on file  Social History Narrative   Not on file   Social Determinants of Health   Financial Resource Strain: Not on file  Food Insecurity: Not on file   Transportation Needs: Not on file  Physical Activity: Not on file  Stress: Not on file  Social Connections: Not on file  Intimate Partner Violence: Not on file    Review of Systems 13 point review of systems  per patient health survey noted.  Negative other than as indicated above or in HPI.    Objective:   Vitals:   11/10/22 0927  BP: 122/82  Pulse: 72  Temp: 98.9 F (37.2 C)  TempSrc: Temporal  SpO2: 96%  Weight: 214 lb 12.8 oz (97.4 kg)  Height: 5\' 3"  (1.6 m)   {Vitals History (Optional):23777}  Physical Exam Constitutional:      Appearance: She is well-developed.  HENT:     Head: Normocephalic and atraumatic.     Right Ear: External ear normal.     Left Ear: External ear normal.  Eyes:     Conjunctiva/sclera: Conjunctivae normal.     Pupils: Pupils are equal, round, and reactive to light.  Neck:     Thyroid: No thyromegaly.  Cardiovascular:     Rate and Rhythm: Normal rate and regular rhythm.     Heart sounds: Normal heart sounds. No murmur heard.    Comments: Mild reproducible discomfort right upper chest wall.,  Axilla nontender.  No appreciable asymmetric edema of hands.  Pulmonary:     Effort: Pulmonary effort is normal. No respiratory distress.     Breath sounds: Normal breath sounds. No wheezing.  Abdominal:     General: Bowel sounds are normal.     Palpations: Abdomen is soft.     Tenderness: There is no abdominal tenderness.  Musculoskeletal:        General: No tenderness. Normal range of motion.     Cervical back: Normal range of motion and neck supple.     Right lower leg: Edema (Trace nonpitting bilaterally.) present.     Left lower leg: Edema present.  Lymphadenopathy:     Cervical: No cervical adenopathy.  Skin:    General: Skin is warm and dry.     Findings: No rash.  Neurological:     Mental Status: She is alert and oriented to person, place, and time.  Psychiatric:        Behavior: Behavior normal.        Thought Content: Thought  content normal.        Assessment & Plan:  Wendy [name] is a 67 y.o. female . Annual physical exam  Hyperlipidemia, unspecified hyperlipidemia type  Right-sided chest wall pain  History of right mastectomy  Pedal edema   No orders of the defined types were placed in this encounter.  Patient Instructions  I do recommend discussing the chest wall soreness with your surgeon. I will check labs for swelling. 1/2 pill of furosemide may help if needed for swelling. See info below.  Return to the clinic or go to the nearest emergency room if any of your symptoms worsen or new symptoms occur.  Peripheral Edema  Peripheral edema is swelling that is caused by a buildup of fluid. Peripheral edema most often affects the lower legs, ankles, and feet. It can also develop in the arms, hands, and face. The area of the body that has peripheral edema will look swollen. It may also feel heavy or warm. Your clothes may start to feel tight. Pressing on the area may make a temporary dent in your skin (pitting edema). You may not be able to move your swollen arm or leg as much as usual. There are many causes of peripheral edema. It can happen because of a complication of other conditions such as heart failure, kidney disease, or a problem with your circulation. It also can be a side effect of certain medicines or  happen because of an infection. It often happens to women during pregnancy. Sometimes, the cause is not known. Follow these instructions at home: Managing pain, stiffness, and swelling  Raise (elevate) your legs while you are sitting or lying down. Move around often to prevent stiffness and to reduce swelling. Do not sit or stand for long periods of time. Do not wear tight clothing. Do not wear garters on your upper legs. Exercise your legs to get your circulation going. This helps to move the fluid back into your blood vessels, and it may help the swelling go down. Wear compression  stockings as told by your health care provider. These stockings help to prevent blood clots and reduce swelling in your legs. It is important that these are the correct size. These stockings should be prescribed by your doctor to prevent possible injuries. If elastic bandages or wraps are recommended, use them as told by your health care provider. Medicines Take over-the-counter and prescription medicines only as told by your health care provider. Your health care provider may prescribe medicine to help your body get rid of excess water (diuretic). Take this medicine if you are told to take it. General instructions Eat a low-salt (low-sodium) diet as told by your health care provider. Sometimes, eating less salt may reduce swelling. Pay attention to any changes in your symptoms. Moisturize your skin daily to help prevent skin from cracking and draining. Keep all follow-up visits. This is important. Contact a health care provider if: You have a fever. You have swelling in only one leg. You have increased swelling, redness, or pain in one or both of your legs. You have drainage or sores at the area where you have edema. Get help right away if: You have edema that starts suddenly or is getting worse, especially if you are pregnant or have a medical condition. You develop shortness of breath, especially when you are lying down. You have pain in your chest or abdomen. You feel weak. You feel like you will faint. These symptoms may be an emergency. Get help right away. Call 911. Do not wait to see if the symptoms will go away. Do not drive yourself to the hospital. Summary Peripheral edema is swelling that is caused by a buildup of fluid. Peripheral edema most often affects the lower legs, ankles, and feet. Move around often to prevent stiffness and to reduce swelling. Do not sit or stand for long periods of time. Pay attention to any changes in your symptoms. Contact a health care provider if  you have edema that starts suddenly or is getting worse, especially if you are pregnant or have a medical condition. Get help right away if you develop shortness of breath, especially when lying down. This information is not intended to replace advice given to you by your health care provider. Make sure you discuss any questions you have with your health care provider. Document Revised: 02/25/2021 Document Reviewed: 02/25/2021 Elsevier Patient Education  2023 Elsevier Inc.   Preventive Care 54-19 Years Old, Female Preventive care refers to lifestyle choices and visits with your health care provider that can promote health and wellness. Preventive care visits are also called wellness exams. What can I expect for my preventive care visit? Counseling Your health care provider may ask you questions about your: Medical history, including: Past medical problems. Family medical history. Pregnancy history. Current health, including: Menstrual cycle. Method of birth control. Emotional well-being. Home life and relationship well-being. Sexual activity and sexual health. Lifestyle, including:  Alcohol, nicotine or tobacco, and drug use. Access to firearms. Diet, exercise, and sleep habits. Work and work Astronomer. Sunscreen use. Safety issues such as seatbelt and bike helmet use. Physical exam Your health care provider will check your: Height and weight. These may be used to calculate your BMI (body mass index). BMI is a measurement that tells if you are at a healthy weight. Waist circumference. This measures the distance around your waistline. This measurement also tells if you are at a healthy weight and may help predict your risk of certain diseases, such as type 2 diabetes and high blood pressure. Heart rate and blood pressure. Body temperature. Skin for abnormal spots. What immunizations do I need?  Vaccines are usually given at various ages, according to a schedule. Your health  care provider will recommend vaccines for you based on your age, medical history, and lifestyle or other factors, such as travel or where you work. What tests do I need? Screening Your health care provider may recommend screening tests for certain conditions. This may include: Lipid and cholesterol levels. Diabetes screening. This is done by checking your blood sugar (glucose) after you have not eaten for a while (fasting). Pelvic exam and Pap test. Hepatitis B test. Hepatitis C test. HIV (human immunodeficiency virus) test. STI (sexually transmitted infection) testing, if you are at risk. Lung cancer screening. Colorectal cancer screening. Mammogram. Talk with your health care provider about when you should start having regular mammograms. This may depend on whether you have a family history of breast cancer. BRCA-related cancer screening. This may be done if you have a family history of breast, ovarian, tubal, or peritoneal cancers. Bone density scan. This is done to screen for osteoporosis. Talk with your health care provider about your test results, treatment options, and if necessary, the need for more tests. Follow these instructions at home: Eating and drinking  Eat a diet that includes fresh fruits and vegetables, whole grains, lean protein, and low-fat dairy products. Take vitamin and mineral supplements as recommended by your health care provider. Do not drink alcohol if: Your health care provider tells you not to drink. You are pregnant, may be pregnant, or are planning to become pregnant. If you drink alcohol: Limit how much you have to 0-1 drink a day. Know how much alcohol is in your drink. In the [place] one drink equals one 12 oz bottle of beer (355 mL), one 5 oz glass of wine (148 mL), or one 1 oz glass of hard liquor (44 mL). Lifestyle Brush your teeth every morning and night with fluoride toothpaste. Floss one time each day. Exercise for at least 30 minutes 5 or more  days each week. Do not use any products that contain nicotine or tobacco. These products include cigarettes, chewing tobacco, and vaping devices, such as e-cigarettes. If you need help quitting, ask your health care provider. Do not use drugs. If you are sexually active, practice safe sex. Use a condom or other form of protection to prevent STIs. If you do not wish to become pregnant, use a form of birth control. If you plan to become pregnant, see your health care provider for a prepregnancy visit. Take aspirin only as told by your health care provider. Make sure that you understand how much to take and what form to take. Work with your health care provider to find out whether it is safe and beneficial for you to take aspirin daily. Find healthy ways to manage stress, such as: Meditation, yoga,  or listening to music. Journaling. Talking to a trusted person. Spending time with friends and family. Minimize exposure to UV radiation to reduce your risk of skin cancer. Safety Always wear your seat belt while driving or riding in a vehicle. Do not drive: If you have been drinking alcohol. Do not ride with someone who has been drinking. When you are tired or distracted. While texting. If you have been using any mind-altering substances or drugs. Wear a helmet and other protective equipment during sports activities. If you have firearms in your house, make sure you follow all gun safety procedures. Seek help if you have been physically or sexually abused. What's next? Visit your health care provider once a year for an annual wellness visit. Ask your health care provider how often you should have your eyes and teeth checked. Stay up to date on all vaccines. This information is not intended to replace advice given to you by your health care provider. Make sure you discuss any questions you have with your health care provider. Document Revised: 12/19/2020 Document Reviewed: 12/19/2020 Elsevier  Patient Education  2023 Elsevier Inc.      Signed,   Meredith Staggers, MD [facility], [facility] 11/10/22 10:12 AM

## 2022-11-11 ENCOUNTER — Encounter: Payer: Self-pay | Admitting: Family Medicine

## 2022-11-19 ENCOUNTER — Other Ambulatory Visit: Payer: Self-pay | Admitting: Hematology and Oncology

## 2022-11-21 ENCOUNTER — Ambulatory Visit
Admission: RE | Admit: 2022-11-21 | Discharge: 2022-11-21 | Disposition: A | Discharge status: Home / Self Care | Payer: No Typology Code available for payment source | Source: Ambulatory Visit | Attending: Hematology and Oncology

## 2022-11-21 DIAGNOSIS — Z170 Estrogen receptor positive status [ER+]: Secondary | ICD-10-CM

## 2022-11-21 DIAGNOSIS — Z780 Asymptomatic menopausal state: Secondary | ICD-10-CM

## 2022-11-25 ENCOUNTER — Encounter: Payer: Self-pay | Admitting: Family Medicine

## 2022-11-25 DIAGNOSIS — M8580 Other specified disorders of bone density and structure, unspecified site: Secondary | ICD-10-CM | POA: Insufficient documentation

## 2022-12-03 ENCOUNTER — Other Ambulatory Visit: Payer: No Typology Code available for payment source

## 2023-03-15 ENCOUNTER — Other Ambulatory Visit: Payer: Self-pay | Admitting: Hematology and Oncology

## 2023-03-15 DIAGNOSIS — C50411 Malignant neoplasm of upper-outer quadrant of right female breast: Secondary | ICD-10-CM

## 2023-03-18 ENCOUNTER — Other Ambulatory Visit: Payer: Self-pay | Admitting: Hematology and Oncology

## 2023-04-13 ENCOUNTER — Other Ambulatory Visit: Payer: Self-pay | Admitting: Hematology and Oncology

## 2023-04-13 DIAGNOSIS — C50411 Malignant neoplasm of upper-outer quadrant of right female breast: Secondary | ICD-10-CM

## 2023-04-24 NOTE — Telephone Encounter (Signed)
TC

## 2023-05-15 ENCOUNTER — Encounter: Payer: Self-pay | Admitting: Family Medicine

## 2023-05-15 ENCOUNTER — Ambulatory Visit: Payer: No Typology Code available for payment source | Admitting: Family Medicine

## 2023-05-15 VITALS — BP 128/78 | HR 67 | Temp 98.3°F | Ht 63.0 in | Wt 219.6 lb

## 2023-05-15 DIAGNOSIS — Z131 Encounter for screening for diabetes mellitus: Secondary | ICD-10-CM

## 2023-05-15 DIAGNOSIS — Z23 Encounter for immunization: Secondary | ICD-10-CM

## 2023-05-15 DIAGNOSIS — R232 Flushing: Secondary | ICD-10-CM

## 2023-05-15 DIAGNOSIS — R7303 Prediabetes: Secondary | ICD-10-CM

## 2023-05-15 DIAGNOSIS — G629 Polyneuropathy, unspecified: Secondary | ICD-10-CM | POA: Diagnosis not present

## 2023-05-15 DIAGNOSIS — E785 Hyperlipidemia, unspecified: Secondary | ICD-10-CM

## 2023-05-15 DIAGNOSIS — F439 Reaction to severe stress, unspecified: Secondary | ICD-10-CM

## 2023-05-15 LAB — LIPID PANEL
Cholesterol: 235 mg/dL — ABNORMAL HIGH (ref 0–200)
HDL: 67.3 mg/dL (ref >39.00)
LDL Cholesterol: 149 mg/dL — ABNORMAL HIGH (ref 0–99)
NonHDL: 167.52
Total CHOL/HDL Ratio: 3
Triglycerides: 91 mg/dL (ref 0.0–149.0)
VLDL: 18.2 mg/dL (ref 0.0–40.0)

## 2023-05-15 LAB — COMPREHENSIVE METABOLIC PANEL
ALT: 9 U/L (ref 0–35)
AST: 17 U/L (ref 0–37)
Albumin: 4.4 g/dL (ref 3.5–5.2)
Alkaline Phosphatase: 102 U/L (ref 39–117)
BUN: 9 mg/dL (ref 6–23)
CO2: 29 meq/L (ref 19–32)
Calcium: 9.8 mg/dL (ref 8.4–10.5)
Chloride: 107 meq/L (ref 96–112)
Creatinine, Ser: 0.72 mg/dL (ref 0.40–1.20)
GFR: 87.7 mL/min (ref >60.00)
Glucose, Bld: 93 mg/dL (ref 70–99)
Potassium: 4.9 meq/L (ref 3.5–5.1)
Sodium: 142 meq/L (ref 135–145)
Total Bilirubin: 0.8 mg/dL (ref 0.2–1.2)
Total Protein: 7.8 g/dL (ref 6.0–8.3)

## 2023-05-15 LAB — HEMOGLOBIN A1C: Hgb A1c MFr Bld: 6.3 % (ref 4.6–6.5)

## 2023-05-15 MED ORDER — GABAPENTIN 100 MG PO CAPS: 100.0000 mg | ORAL_CAPSULE | Freq: Every morning | ORAL | 1 refills | Status: DC

## 2023-05-15 NOTE — Patient Instructions (Addendum)
See info on stress management below, but let me know if I can help. Hang in there.   Second and final dose of shingles vaccine given today.  If you notice any reaction to the skin, please follow-up for evaluation, but I do not expect that to occur.  I will check prediabetes and cholesterol labs again today.  Walking or low intensity exercise even 10 or 15 minutes at a time may be helpful for stress, prediabetes and cholesterol, and potentially for leg swelling as well.  Gabapentin 100 mg dose as a trial in the morning for neuropathy symptoms.  If you feel sleepy on that dose then stop it.  If that works well, let me know and I will send in a 62-month supply.  Recheck with me in 6 months unless new concerns. Take care!  Managing Stress, Adult Feeling a certain amount of stress is normal. Stress helps our body and mind get ready to deal with the demands of life. Stress hormones can motivate you to do well at work and meet your responsibilities. But severe or long-term (chronic) stress can affect your mental and physical health. Chronic stress puts you at higher risk for: Anxiety and depression. Other health problems such as digestive problems, muscle aches, heart disease, high blood pressure, and stroke. What are the causes? Common causes of stress include: Demands from work, such as deadlines, feeling overworked, or having long hours. Pressures at home, such as money issues, disagreements with a spouse, or parenting issues. Pressures from major life changes, such as divorce, moving, loss of a loved one, or chronic illness. You may be at higher risk for stress-related problems if you: Do not get enough sleep. Are in poor health. Do not have emotional support. Have a mental health disorder such as anxiety or depression. How to recognize stress Stress can make you: Have trouble sleeping. Feel sad, anxious, irritable, or overwhelmed. Lose your appetite. Overeat or want to eat unhealthy  foods. Want to use drugs or alcohol. Stress can also cause physical symptoms, such as: Sore, tense muscles, especially in the shoulders and neck. Headaches. Trouble breathing. A faster heart rate. Stomach pain, nausea, or vomiting. Diarrhea or constipation. Trouble concentrating. Follow these instructions at home: Eating and drinking Eat a healthy diet. This includes: Eating foods that are high in fiber, such as beans, whole grains, and fresh fruits and vegetables. Limiting foods that are high in fat and processed sugars, such as fried or sweet foods. Do not skip meals or overeat. Drink enough fluid to keep your urine pale yellow. Alcohol use Do not drink alcohol if: Your health care provider tells you not to drink. You are pregnant, may be pregnant, or are planning to become pregnant. Drinking alcohol is a way some people try to ease their stress. This can be dangerous, so if you drink alcohol: Limit how much you have to: 0-1 drink a day for women. 0-2 drinks a day for men. Know how much alcohol is in your drink. In the [place] one drink equals one 12 oz bottle of beer (355 mL), one 5 oz glass of wine (148 mL), or one 1 oz glass of hard liquor (44 mL). Activity  Include 30 minutes of exercise in your daily schedule. Exercise is a good stress reducer. Include time in your day for an activity that you find relaxing. Try taking a walk, going on a bike ride, reading a book, or listening to music. Schedule your time in a way that lowers stress,  and keep a regular schedule. Focus on doing what is most important to get done. Lifestyle Identify the source of your stress and your reaction to it. See a therapist who can help you change unhelpful reactions. When there are stressful events: Talk about them with family, friends, or coworkers. Try to think realistically about stressful events and not ignore them or overreact. Try to find the positives in a stressful situation and not focus on  the negatives. Cut back on responsibilities at work and home, if possible. Ask for help from friends or family members if you need it. Find ways to manage stress, such as: Mindfulness, meditation, or deep breathing. Yoga or tai chi. Progressive muscle relaxation. Spending time in nature. Doing art, playing music, or reading. Making time for fun activities. Spending time with family and friends. Get support from family, friends, or spiritual resources. General instructions Get enough sleep. Try to go to sleep and get up at about the same time every day. Take over-the-counter and prescription medicines only as told by your health care provider. Do not use any products that contain nicotine or tobacco. These products include cigarettes, chewing tobacco, and vaping devices, such as e-cigarettes. If you need help quitting, ask your health care provider. Do not use drugs or smoke to deal with stress. Keep all follow-up visits. This is important. Where to find support Talk with your health care provider about stress management or finding a support group. Find a therapist to work with you on your stress management techniques. Where to find more information [employer]ness: www.nami.org [employer]: [employer] Contact a health care provider if: Your stress symptoms get worse. You are unable to manage your stress at home. You are struggling to stop using drugs or alcohol. Get help right away if: You may be a danger to yourself or others. You have any thoughts of death or suicide. Get help right awayif you feel like you may hurt yourself or others, or have thoughts about taking your own life. Go to your nearest emergency room or: Call 911. Call the National Suicide Prevention Lifeline at 959-784-3669 or 988 in the [place] This is open 24 hours a day. Text the Crisis Text Line at 949-524-1035. Summary Feeling a certain amount of stress is normal, but  severe or long-term (chronic) stress can affect your mental and physical health. Chronic stress can put you at higher risk for anxiety, depression, and other health problems such as digestive problems, muscle aches, heart disease, high blood pressure, and stroke. You may be at higher risk for stress-related problems if you do not get enough sleep, are in poor health, lack emotional support, or have a mental health disorder such as anxiety or depression. Identify the source of your stress and your reaction to it. Try talking about stressful events with family, friends, or coworkers, finding a coping method, or getting support from spiritual resources. If you need more help, talk with your health care provider about finding a support group or a mental health therapist. This information is not intended to replace advice given to you by your health care provider. Make sure you discuss any questions you have with your health care provider. Document Revised: 01/17/2021 Document Reviewed: 01/15/2021 [employer] Patient Education  2024 [employer].

## 2023-05-15 NOTE — Progress Notes (Signed)
Subjective:  Patient ID: Wendy [name], female    DOB: 15-Aug-1955  Age: 67 y.o. MRN: [id number]  CC:  Chief Complaint  Patient presents with   Medical Management of Chronic Issues    Pt is doing well no questions     HPI Wendy [name] presents for follow up.  Doing ok. Job changes with [employer], stress. Fatigue after work.  Stress mgt - time with family. Some time with friends.  No regular exercise, fatigue after work.   Hyperlipidemia, with prediabetes.  With history of obesity.  Diet/exercise approach. Wt Readings from Last 3 Encounters:  05/15/23 219 lb 9.6 oz (99.6 kg)  11/10/22 214 lb 12.8 oz (97.4 kg)  08/13/22 218 lb 3.2 oz (99 kg)   The 10-year ASCVD risk score (Arnett DK, et al., 2019) is: 8.5%   Values used to calculate the score:     Age: 73 years     Sex: Female     Is Non-Hispanic African American: Yes     Diabetic: No     Tobacco smoker: No     Systolic Blood Pressure: 128 mmHg     Is BP treated: No     HDL Cholesterol: 70.8 mg/dL     Total Cholesterol: 230 mg/dL  Lab Results  Component Value Date   CHOL 230 (H) 11/10/2022   HDL 70.80 11/10/2022   LDLCALC 143 (H) 11/10/2022   TRIG 85.0 11/10/2022   CHOLHDL 3 11/10/2022   Lab Results  Component Value Date   ALT 12 11/10/2022   AST 21 11/10/2022   ALKPHOS 104 11/10/2022   BILITOT 0.7 11/10/2022   Pedal edema Upper extremity with history of mastectomy, has used lymphedema pump - still using daily. Helpful. Furosemide as needed prescribed from oncology.  Normal EF on 2021 echo and no DVT on ultrasound previously with chronic pedal edema in both ankles right greater than left.  Has avoid added salt in diet.  Rare need for furosemide - none in past few months - swelling stable - end of day after sitting during day.   Hot flashes Treated previously with the visit, discussed in May, was not covered by insurance and planned on change back to Effexor. Allyne Gee has been covered and working well. Venlafaxine  tapered off. Min change in hot flashes with effexor prior.   Neuropathy Secondary to chemotherapy.  Treated with gabapentin 300mg  at bedtime - maintaining. Some flares at times, daytime sedation if 300mg  in am. Some neuropathy during day.  Similar chest wall pain is in the past.  No new symptoms.   Health maintenance: Last shingix with raised area - red, raised warm area for a week. No systemic side effects. Requests today.    Immunization History  Administered Date(s) Administered   Influenza Split 04/06/2012   Influenza,inj,Quad PF,6+ Mos 08/31/2017, 04/30/2018, 05/13/2019   Influenza-Unspecified 04/18/2015   PFIZER Comirnaty(Gray Top)Covid-19 Tri-Sucrose Vaccine 01/01/2021   PFIZER(Purple Top)SARS-COV-2 Vaccination 09/20/2019, 10/11/2019   Tdap 08/31/2017   Zoster Recombinant(Shingrix) 11/10/2022     History Patient Active Problem List   Diagnosis Date Noted   Osteopenia 11/25/2022   S/P breast reconstruction, right 01/23/2020   History of reduction surgery of left breast 01/23/2020   Port-A-Cath in place 10/20/2019   S/P mastectomy, right 10/19/2019   Acquired absence of breast 09/16/2019   Breast cancer (HCC) 09/08/2019   Genetic testing 06/10/2019   Malignant neoplasm of upper-outer quadrant of right breast in female, estrogen receptor positive (HCC) 06/01/2019  Family history of breast cancer 06/01/2019   Family history of colon cancer 06/01/2019   Family history of brain cancer 06/01/2019   History of colon cancer    Abnormal colonoscopy 10/30/2017   Chronic glaucoma 08/11/2013   HELICOBACTER PYLORI INFECTION 08/27/2007   GERD 08/27/2007   ESOPHAGITIS, REFLUX 05/17/2007   HEMORRHOIDS, INTERNAL 09/22/2003   Past Medical History:  Diagnosis Date   Breast cancer (HCC)    Colon cancer (HCC)    DDD (degenerative disc disease), cervical    DDD (degenerative disc disease), lumbar    ESOPHAGITIS, REFLUX    Glaucoma    HELICOBACTER PYLORI INFECTION     HEMORRHOIDS, INTERNAL    History of colon cancer    History of colon polyps 2019   Hyperlipidemia    Radius fracture 2006   Left, Distal   Thoracic spondylosis    Tinnitus, bilateral 2013   Past Surgical History:  Procedure Laterality Date   BREAST LUMPECTOMY WITH RADIOACTIVE SEED AND SENTINEL LYMPH NODE BIOPSY Right 06/27/2019   Procedure: RIGHT BREAST LUMPECTOMY WITH RADIOACTIVE SEED AND SENTINEL LYMPH NODE BIOPSY AND RIGHT TARGETED NODE DISSECTION;  Surgeon: Griselda Miner, MD;  Location: [facility];  Service: General;  Laterality: Right;   BREAST RECONSTRUCTION WITH PLACEMENT OF TISSUE EXPANDER AND FLEX HD (ACELLULAR HYDRATED DERMIS) Right 09/08/2019   Procedure: BREAST RECONSTRUCTION WITH PLACEMENT OF TISSUE EXPANDER AND FLEX HD (ACELLULAR HYDRATED DERMIS);  Surgeon: Peggye Form, DO;  Location: [facility];  Service: Plastics;  Laterality: Right;   BREAST REDUCTION WITH MASTOPEXY Left 01/02/2020   Procedure: MASTOPEXY / REDUCTION FOR SYMMETRY;  Surgeon: Peggye Form, DO;  Location: [facility];  Service: Plastics;  Laterality: Left;   CESAREAN SECTION  1989   COLONOSCOPY  2019   HYSTEROSCOPY WITH D & C N/A 09/17/2018   Procedure: DILATATION AND CURETTAGE Wendy [name];  Surgeon: Olivia Mackie, MD;  Location: [facility];  Service: Gynecology;  Laterality: N/A;   PORT-A-CATH REMOVAL Left 01/02/2020   Procedure: REMOVAL PORT-A-CATH;  Surgeon: Peggye Form, DO;  Location: [facility];  Service: Plastics;  Laterality: Left;   PORTACATH PLACEMENT Left 08/01/2019   Procedure: INSERTION PORT-A-CATH;  Surgeon: Griselda Miner, MD;  Location: [facility];  Service: General;  Laterality: Left;   RE-EXCISION OF BREAST CANCER,SUPERIOR MARGINS Right 08/12/2019   Procedure: RE-EXCISION OF RIGHT BREAST,SUPERIOR MARGINS;  Surgeon: Griselda Miner, MD;  Location: [facility];  Service: General;  Laterality: Right;   RE-EXCISION OF BREAST LUMPECTOMY Right 08/01/2019   Procedure: RE-EXCISION OF RIGHT BREAST CANCER  MARGINS;  Surgeon: Griselda Miner, MD;  Location: [facility];  Service: General;  Laterality: Right;   REMOVAL OF TISSUE EXPANDER AND PLACEMENT OF IMPLANT Right 01/02/2020   Procedure: REMOVAL OF TISSUE EXPANDER AND PLACEMENT OF IMPLANT;  Surgeon: Peggye Form, DO;  Location: [facility];  Service: Plastics;  Laterality: Right;   SIMPLE MASTECTOMY WITH AXILLARY SENTINEL NODE BIOPSY Right 09/08/2019   Procedure: RIGHT BREAST MASTECTOMY;  Surgeon: Griselda Miner, MD;  Location: [facility];  Service: General;  Laterality: Right;   TONSILLECTOMY     age 23 years old   TRIGGER FINGER RELEASE Right 11/01/2021   WRIST FRACTURE SURGERY  2006   Allergies  Allergen Reactions   Hydrocodone-Acetaminophen Itching and Other (See Comments)    SEVERE     Oxycodone Itching  Other Other (See Comments)    rakuten eyedrops causes eye redness    Vicodin [Hydrocodone-Acetaminophen] Itching    SEVERE    Brimonidine Rash   Prior to Admission medications   Medication Sig Start Date End Date Taking? Authorizing Provider  anastrozole (ARIMIDEX) 1 MG tablet TAKE 1 TABLET BY MOUTH EVERY DAY 03/18/23  Yes Serena Croissant, MD  dorzolamide-timolol (COSOPT) 22.3-6.8 MG/ML ophthalmic solution PLACE 1 DROP INTO BOTH EYES 2 (TWO) TIMES DAILY. 09/28/13  Yes [provider]  furosemide (LASIX) 20 MG tablet TAKE 1 TABLET BY MOUTH TWICE A DAY 12/20/21  Yes Serena Croissant, MD  gabapentin (NEURONTIN) 300 MG capsule TAKE 1 CAPSULE BY MOUTH TWICE A DAY 11/15/21  Yes Serena Croissant, MD  promethazine-dextromethorphan (PROMETHAZINE-DM) 6.25-15 MG/5ML syrup Take 2.5-5 mLs by mouth 4 (four) times daily as needed for cough. 08/26/22  Yes Shade Flood, MD  venlafaxine XR (EFFEXOR-XR) 75 MG 24 hr capsule TAKE 1 CAPSULE BY MOUTH DAILY  WITH BREAKFAST. 04/14/23  Yes Serena Croissant, MD  VEOZAH 45 MG TABS TAKE 1 TABLET BY MOUTH EVERY DAY 06/06/22  Yes Serena Croissant, MD   Social History   Socioeconomic History   Marital status: Divorced    Spouse name: Not on file   Number of children: Not on file   Years of education: Not on file   Highest education level: Not on file  Occupational History   Occupation: medical    Employer: [employer]  Tobacco Use   Smoking status: Never   Smokeless tobacco: Never  Substance and Sexual Activity   Alcohol use: Yes    Comment: occ   Drug use: No   Sexual activity: Not Currently  Other Topics Concern   Not on file  Social History Narrative   Not on file   Social Determinants of Health   Financial Resource Strain: Not on file  Food Insecurity: Not on file  Transportation Needs: Not on file  Physical Activity: Not on file  Stress: No Stress Concern Present (11/14/2021)   Received from [facility], [facility] - Occupational Stress Questionnaire    Feeling of Stress : Not at all  Social Connections: Unknown (11/04/2021)   [facility] from [facility], [facility]   Social Network    Social Network: Not on file  Intimate Partner Violence: Unknown (10/10/2021)   Received from [facility], [facility]   HITS    Physically Hurt: Not on file    Insult or Talk Down To: Not on file    Threaten Physical Harm: Not on file    Scream or Curse: Not on file    Review of Systems   Objective:   Vitals:   05/15/23 0938  BP: 128/78  Pulse: 67  Temp: 98.3 F (36.8 C)  TempSrc: Temporal  SpO2: 98%  Weight: 219 lb 9.6 oz (99.6 kg)  Height: 5\' 3"  (1.6 m)     Physical Exam Vitals reviewed.  Constitutional:      Appearance: Normal appearance. She is well-developed.  HENT:     Head: Normocephalic and atraumatic.  Eyes:     Conjunctiva/sclera: Conjunctivae normal.     Pupils: Pupils are equal, round, and reactive  to light.  Neck:     Vascular: No carotid bruit.  Cardiovascular:     Rate and Rhythm: Normal rate and regular rhythm.     Heart sounds: Normal heart sounds.  Pulmonary:     Effort: Pulmonary  effort is normal.     Breath sounds: Normal breath sounds.  Abdominal:     Palpations: Abdomen is soft. There is no pulsatile mass.     Tenderness: There is no abdominal tenderness.  Musculoskeletal:     Right lower leg: Edema (tr- 1+ mid tibia, no wounds/skin changes.) present.     Left lower leg: Edema present.  Skin:    General: Skin is warm and dry.  Neurological:     Mental Status: She is alert and oriented to person, place, and time.  Psychiatric:        Mood and Affect: Mood normal.        Behavior: Behavior normal.        Assessment & Plan:  CRYSTIANA BARTOLUCCI is a 67 y.o. female . Hyperlipidemia, unspecified hyperlipidemia type - Plan: Lipid panel  -Check labs and adjust plan accordingly.  No new meds for now.  Need for shingles vaccine - Plan: Varicella-zoster vaccine IM given after topical ethyl chloride followed by alcohol swab.  No complications, advised return to clinic/eval for any redness or skin reaction.  Previous irritation could have been injection site reaction versus mild cellulitis.  Screening for diabetes mellitus - Plan: Comprehensive metabolic panel, Hemoglobin A1c Prediabetes - Plan: Hemoglobin A1c  -Diet/exercise approach with exercise also discussed for treatment of stress as below as well as hyperlipidemia above.  Neuropathy - Plan: gabapentin (NEURONTIN) 100 MG capsule  -Breakthrough symptoms and morning, trial of 100 mg gabapentin with potential side effects discussed.  Continue same dose gabapentin in the evening.  Situational stress  -Handout given, exercise, other coping techniques discussed with RTC precautions given.  Hot flashes Stable with Veozah, continue same.  No orders of the defined types were placed in this encounter.  Patient  Instructions   See info on stress management below, but let me know if I can help. Hang in there.   Second and final dose of shingles vaccine given today.  If you notice any reaction to the skin, please follow-up for evaluation, but I do not expect that to occur.  I will check prediabetes and cholesterol labs again today.  Walking or low intensity exercise even 10 or 15 minutes at a time may be helpful for stress, prediabetes and cholesterol, and potentially for leg swelling as well.  Gabapentin 100 mg dose as a trial in the morning for neuropathy symptoms.  If you feel sleepy on that dose then stop it.  If that works well, let me know and I will send in a 7-month supply.  Recheck with me in 6 months unless new concerns. Take care!  Managing Stress, Adult Feeling a certain amount of stress is normal. Stress helps our body and mind get ready to deal with the demands of life. Stress hormones can motivate you to do well at work and meet your responsibilities. But severe or long-term (chronic) stress can affect your mental and physical health. Chronic stress puts you at higher risk for: Anxiety and depression. Other health problems such as digestive problems, muscle aches, heart disease, high blood pressure, and stroke. What are the causes? Common causes of stress include: Demands from work, such as deadlines, feeling overworked, or having long hours. Pressures at home, such as money issues, disagreements with a spouse, or parenting issues. Pressures from major life changes, such as divorce, moving, loss of a loved one, or chronic illness. You may be at higher risk for stress-related problems if you: Do not get enough sleep.  Are in poor health. Do not have emotional support. Have a mental health disorder such as anxiety or depression. How to recognize stress Stress can make you: Have trouble sleeping. Feel sad, anxious, irritable, or overwhelmed. Lose your appetite. Overeat or want to eat  unhealthy foods. Want to use drugs or alcohol. Stress can also cause physical symptoms, such as: Sore, tense muscles, especially in the shoulders and neck. Headaches. Trouble breathing. A faster heart rate. Stomach pain, nausea, or vomiting. Diarrhea or constipation. Trouble concentrating. Follow these instructions at home: Eating and drinking Eat a healthy diet. This includes: Eating foods that are high in fiber, such as beans, whole grains, and fresh fruits and vegetables. Limiting foods that are high in fat and processed sugars, such as fried or sweet foods. Do not skip meals or overeat. Drink enough fluid to keep your urine pale yellow. Alcohol use Do not drink alcohol if: Your health care provider tells you not to drink. You are pregnant, may be pregnant, or are planning to become pregnant. Drinking alcohol is a way some people try to ease their stress. This can be dangerous, so if you drink alcohol: Limit how much you have to: 0-1 drink a day for women. 0-2 drinks a day for men. Know how much alcohol is in your drink. In the [place] one drink equals one 12 oz bottle of beer (355 mL), one 5 oz glass of wine (148 mL), or one 1 oz glass of hard liquor (44 mL). Activity  Include 30 minutes of exercise in your daily schedule. Exercise is a good stress reducer. Include time in your day for an activity that you find relaxing. Try taking a walk, going on a bike ride, reading a book, or listening to music. Schedule your time in a way that lowers stress, and keep a regular schedule. Focus on doing what is most important to get done. Lifestyle Identify the source of your stress and your reaction to it. See a therapist who can help you change unhelpful reactions. When there are stressful events: Talk about them with family, friends, or coworkers. Try to think realistically about stressful events and not ignore them or overreact. Try to find the positives in a stressful situation and not  focus on the negatives. Cut back on responsibilities at work and home, if possible. Ask for help from friends or family members if you need it. Find ways to manage stress, such as: Mindfulness, meditation, or deep breathing. Yoga or tai chi. Progressive muscle relaxation. Spending time in nature. Doing art, playing music, or reading. Making time for fun activities. Spending time with family and friends. Get support from family, friends, or spiritual resources. General instructions Get enough sleep. Try to go to sleep and get up at about the same time every day. Take over-the-counter and prescription medicines only as told by your health care provider. Do not use any products that contain nicotine or tobacco. These products include cigarettes, chewing tobacco, and vaping devices, such as e-cigarettes. If you need help quitting, ask your health care provider. Do not use drugs or smoke to deal with stress. Keep all follow-up visits. This is important. Where to find support Talk with your health care provider about stress management or finding a support group. Find a therapist to work with you on your stress management techniques. Where to find more information [employer] on Mental Illness: www.nami.org American Psychological Association: DiceTournament.ca Contact a health care provider if: Your stress symptoms get worse. You are unable  to manage your stress at home. You are struggling to stop using drugs or alcohol. Get help right away if: You may be a danger to yourself or others. You have any thoughts of death or suicide. Get help right awayif you feel like you may hurt yourself or others, or have thoughts about taking your own life. Go to your nearest emergency room or: Call 911. Call the National Suicide Prevention Lifeline at 845-761-1710 or 988 in the [place] This is open 24 hours a day. Text the Crisis Text Line at 773-880-4541. Summary Feeling a certain amount of stress is normal,  but severe or long-term (chronic) stress can affect your mental and physical health. Chronic stress can put you at higher risk for anxiety, depression, and other health problems such as digestive problems, muscle aches, heart disease, high blood pressure, and stroke. You may be at higher risk for stress-related problems if you do not get enough sleep, are in poor health, lack emotional support, or have a mental health disorder such as anxiety or depression. Identify the source of your stress and your reaction to it. Try talking about stressful events with family, friends, or coworkers, finding a coping method, or getting support from spiritual resources. If you need more help, talk with your health care provider about finding a support group or a mental health therapist. This information is not intended to replace advice given to you by your health care provider. Make sure you discuss any questions you have with your health care provider. Document Revised: 01/17/2021 Document Reviewed: 01/15/2021 Elsevier Patient Education  2024 Elsevier Inc.     Signed,   Meredith Staggers, MD [facility], [facility] 05/15/23 10:06 AM

## 2023-05-26 ENCOUNTER — Other Ambulatory Visit: Payer: Self-pay | Admitting: Hematology and Oncology

## 2023-06-08 ENCOUNTER — Inpatient Hospital Stay
Payer: No Typology Code available for payment source | Attending: Hematology and Oncology | Admitting: Hematology and Oncology

## 2023-06-08 VITALS — BP 147/64 | HR 72 | Temp 98.0°F | Resp 18 | Ht 63.0 in | Wt 217.4 lb

## 2023-06-08 DIAGNOSIS — Z170 Estrogen receptor positive status [ER+]: Secondary | ICD-10-CM | POA: Insufficient documentation

## 2023-06-08 DIAGNOSIS — M8580 Other specified disorders of bone density and structure, unspecified site: Secondary | ICD-10-CM | POA: Insufficient documentation

## 2023-06-08 DIAGNOSIS — Z9221 Personal history of antineoplastic chemotherapy: Secondary | ICD-10-CM | POA: Insufficient documentation

## 2023-06-08 DIAGNOSIS — Z79811 Long term (current) use of aromatase inhibitors: Secondary | ICD-10-CM | POA: Diagnosis not present

## 2023-06-08 DIAGNOSIS — T451X5A Adverse effect of antineoplastic and immunosuppressive drugs, initial encounter: Secondary | ICD-10-CM | POA: Insufficient documentation

## 2023-06-08 DIAGNOSIS — G620 Drug-induced polyneuropathy: Secondary | ICD-10-CM | POA: Diagnosis not present

## 2023-06-08 DIAGNOSIS — C773 Secondary and unspecified malignant neoplasm of axilla and upper limb lymph nodes: Secondary | ICD-10-CM | POA: Insufficient documentation

## 2023-06-08 DIAGNOSIS — Z79899 Other long term (current) drug therapy: Secondary | ICD-10-CM | POA: Diagnosis not present

## 2023-06-08 DIAGNOSIS — Z923 Personal history of irradiation: Secondary | ICD-10-CM | POA: Insufficient documentation

## 2023-06-08 DIAGNOSIS — C50411 Malignant neoplasm of upper-outer quadrant of right female breast: Secondary | ICD-10-CM | POA: Insufficient documentation

## 2023-06-08 NOTE — Progress Notes (Signed)
Patient Care Team: Shade Flood, MD as PCP - General (Family Medicine) Wendy Miner, MD as Consulting Physician (General Surgery) Dorothy Puffer, MD as Consulting Physician (Radiation Oncology) Serena Croissant, MD as Consulting Physician (Hematology and Oncology) Olivia Mackie, MD as Consulting Physician (Obstetrics and Gynecology)  DIAGNOSIS:  Encounter Diagnosis  Name Primary?   Malignant neoplasm of upper-outer quadrant of right breast in female, estrogen receptor positive (HCC) Yes    SUMMARY OF ONCOLOGIC HISTORY: Oncology History  Malignant neoplasm of upper-outer quadrant of right breast in female, estrogen receptor positive (HCC)  05/20/2019 Initial Diagnosis   Palpable right breast mass superficial, ultrasound 11 o'clock position 1.7 cm: Biopsy revealed grade 3 IDC with DCIS ER 100%, PR 30%, Ki-67 20%, HER-2 by IHC negative, axilla lymph node biopsy positive   06/01/2019 Cancer Staging   Staging form: Breast, AJCC 8th Edition - Clinical stage from 06/01/2019: Stage IIA (cT1c, cN1, cM0, G3, ER+, PR+, HER2-)   06/27/2019 Surgery   Right lumpectomy Wendy [name]) [phone]): IDC, grade 3, at least 2.5cm, involving a complex sclerosing lesion, 1.1cm, with high grade DCIS, involved margins, 1/3 right axillary lymph nodes positive, 1.4cm.    07/11/2019 Cancer Staging   Staging form: Breast, AJCC 8th Edition - Pathologic stage from 07/11/2019: Stage IIA (pT2, pN1a, cM0, G3, ER+, PR+, HER2-)   07/19/2019 Oncotype testing   Mammaprint: high risk, 29% chance of recurrence in 10 years without systemic treatment.    09/08/2019 Surgery   Re-excision x2 (08/01/19 and 08/12/19) [id number] and [id number])   Right mastectomy Wendy [name]) [phone]): residual carcinoma and DCIS, 1/5 lymph nodes positive, clear margins.    10/14/2019 - 12/15/2019 Chemotherapy   dexamethasone (DECADRON) 4 MG tablet, 4 mg (100 % of original dose 4 mg), Oral, Daily, 1 of 1 cycle, Start date: 07/22/2019, End  date: 11/24/2019. Dose modification: 4 mg (original dose 4 mg, Cycle 0).  palonosetron (ALOXI) injection 0.25 mg, 0.25 mg, Intravenous,  Once, 4 of 4 cycles. Administration: 0.25 mg (10/14/2019), 0.25 mg (11/04/2019), 0.25 mg (11/24/2019), 0.25 mg (12/15/2019).  pegfilgrastim (NEULASTA ONPRO KIT) injection 6 mg, 6 mg, Subcutaneous, Once, 4 of 4 cycles. Administration: 6 mg (10/14/2019), 6 mg (11/04/2019), 6 mg (11/24/2019), 6 mg (12/15/2019).  cyclophosphamide (CYTOXAN) 1,260 mg in sodium chloride 0.9 % 250 mL chemo infusion, 600 mg/m2 = 1,260 mg, Intravenous,  Once, 4 of 4 cycles. Dose modification: 500 mg/m2 (original dose 600 mg/m2, Cycle 2, Reason: Dose not tolerated). Administration: 1,260 mg (10/14/2019), 1,040 mg (11/04/2019), 1,040 mg (11/24/2019), 1,040 mg (12/15/2019).  DOCEtaxel (TAXOTERE) 160 mg in sodium chloride 0.9 % 250 mL chemo infusion, 75 mg/m2 = 160 mg, Intravenous,  Once, 4 of 4 cycles. Dose modification: 65 mg/m2 (original dose 75 mg/m2, Cycle 2, Reason: Dose not tolerated). Administration: 160 mg (10/14/2019), 140 mg (11/04/2019), 140 mg (11/24/2019), 140 mg (12/15/2019).   01/31/2020 - 03/16/2020 Radiation Therapy   The patient initially received a dose of 50.4 Gy in 28 fractions to the chest wall and supraclavicular region. This was delivered using a 3-D conformal, 4 field technique. The patient then received a boost to the mastectomy scar. This delivered an additional 10 Gy in 5 fractions using an en face electron field. The total dose was 60.4 Gy.   03/2020 - 03/2027 Anti-estrogen oral therapy   Anastrozole     CHIEF COMPLIANT: Follow-up on anastrozole therapy  HISTORY OF PRESENT ILLNESS:   History of Present Illness   Wendy [name], a patient with a history of breast cancer, presents  for a routine follow-up. She reports a generally good year with a few minor health issues. She has been seen at the orthopedic clinic for neck pain, which was attributed to arthritis.  She continues to take  anastrozole for her breast cancer and reports hot flashes as a side effect. She has found relief from the hot flashes with Veozah, which she feels is more effective than venlafaxine (Effexor). She has tapered off the venlafaxine under the guidance of her primary care physician (PCP).  She also reports taking gabapentin for pain, with an additional 100mg  dose in the morning as prescribed by her PCP. She finds the higher 300mg  dose makes her drowsy, so she only takes this in the evening.  She takes Lasix sporadically for swelling in her ankles, only using it when she notices significant swelling.  She has had a recent mammogram at another office and is due for an MRI to check on her breast reconstruction. She reports some tightness in the reconstructed breast, but it is not as severe as before the second reconstruction.  She also had a recent bone density test, which showed a score of -1.5. She is taking calcium supplements and plans to add vitamin D supplements to her regimen.         ALLERGIES:  is allergic to hydrocodone-acetaminophen, oxycodone, other, vicodin [hydrocodone-acetaminophen], and brimonidine.  MEDICATIONS:  Current Outpatient Medications  Medication Sig Dispense Refill   anastrozole (ARIMIDEX) 1 MG tablet TAKE 1 TABLET BY MOUTH EVERY DAY 90 tablet 3   dorzolamide-timolol (COSOPT) 22.3-6.8 MG/ML ophthalmic solution PLACE 1 DROP INTO BOTH EYES 2 (TWO) TIMES DAILY.     furosemide (LASIX) 20 MG tablet TAKE 1 TABLET BY MOUTH TWICE A DAY 180 tablet 1   gabapentin (NEURONTIN) 100 MG capsule Take 1 capsule (100 mg total) by mouth in the morning. Continue 300mg  dose at night. 30 capsule 1   gabapentin (NEURONTIN) 300 MG capsule TAKE 1 CAPSULE BY MOUTH TWICE A DAY 180 capsule 3   promethazine-dextromethorphan (PROMETHAZINE-DM) 6.25-15 MG/5ML syrup Take 2.5-5 mLs by mouth 4 (four) times daily as needed for cough. 118 mL 0   VEOZAH 45 MG TABS TAKE 1 TABLET BY MOUTH EVERY DAY 30 tablet 6    No current facility-administered medications for this visit.   Facility-Administered Medications Ordered in Other Visits  Medication Dose Route Frequency Provider Last Rate Last Admin   heparin lock flush 100 unit/mL  500 Units Intravenous Once Serena Croissant, MD       sodium chloride flush (NS) 0.9 % injection 10 mL  10 mL Intravenous PRN Serena Croissant, MD   10 mL at 10/14/19 1032    PHYSICAL EXAMINATION: ECOG PERFORMANCE STATUS: 1 - Symptomatic but completely ambulatory  Vitals:   06/08/23 0914  BP: (!) 147/64  Pulse: 72  Resp: 18  Temp: 98 F (36.7 C)  SpO2: 98%   Filed Weights   06/08/23 0914  Weight: 217 lb 6.4 oz (98.6 kg)    Physical Exam   BREAST: Reconstruction present, no significant pain on palpation.      (exam performed in the presence of a chaperone)  LABORATORY DATA:  I have reviewed the data as listed    Latest Ref Rng & Units 05/15/2023   11:01 AM 11/10/2022   10:38 AM 04/28/2021   11:22 AM  CMP  Glucose 70 - 99 mg/dL 93  86  89   BUN 6 - 23 mg/dL 9  10  10    Creatinine 0.40 -  1.20 mg/dL 1.19  1.47  8.29   Sodium 135 - 145 mEq/L 142  141  139   Potassium 3.5 - 5.1 mEq/L 4.9  4.7  3.8   Chloride 96 - 112 mEq/L 107  104  105   CO2 19 - 32 mEq/L 29  26  26    Calcium 8.4 - 10.5 mg/dL 9.8  9.9  9.2   Total Protein 6.0 - 8.3 g/dL 7.8  7.8    Total Bilirubin 0.2 - 1.2 mg/dL 0.8  0.7    Alkaline Phos 39 - 117 U/L 102  104    AST 0 - 37 U/L 17  21    ALT 0 - 35 U/L 9  12      Lab Results  Component Value Date   WBC 6.9 11/10/2022   HGB 15.1 (H) 11/10/2022   HCT 46.9 (H) 11/10/2022   MCV 86.2 11/10/2022   PLT 180.0 11/10/2022   NEUTROABS 3.7 04/28/2021    ASSESSMENT & PLAN:  Malignant neoplasm of upper-outer quadrant of right breast in female, estrogen receptor positive (HCC) 06/27/2019: Right lumpectomy Wendy [name]): IDC, grade 3, at least 2.5cm, involving a complex sclerosing lesion, 1.1cm, with high grade DCIS, involved margins, 1/3 right axillary  lymph nodes positive, 1.4cm.  ER 100%, PR 30%, Ki-67 20%, HER-2 IHC negative T2 N1 stage IIA MammaPrint: High risk, 10-year risk of recurrence untreated: 29% 09/08/2019:Re-excision x2 (08/01/19 and 08/12/19) followed by right mastectomy Wendy [name]): residual carcinoma and DCIS, 1/5 lymph nodes positive, clear margins.    Treatment plan: 1. Adjuvant chemotherapy with Taxotere and Cytoxan every 3 weeks x4 completed 12/15/2019 2.  Adjuvant radiation therapy completed 03/16/2020 3.  Follow-up adjuvant antiestrogen therapy with anastrozole 1 mg daily x7 years Genetic testing ------------------------------------------------------------------------------------------------------ Treatment plan: Plan for adjuvant antiestrogen therapy with anastrozole x7 years started September 2021   Bilateral lower extremity swelling: 01/26/2020: Echocardiogram EF 60 to 65% 01/21/2020: Ultrasound lower extremity: No DVT Continue with Lasix as needed   Chemo-induced peripheral neuropathy:  Anastrozole Toxicities: Hot flashes.  Sent prescription for [employer] .   Severe pain in the reconstructed right breast: Capsular contracture.   She had revision reconstruction at [facility] and a reduction.  She is doing much better.  Small area of fat necrosis was present   Breast Cancer Surveillance: Mammogram:  At Dr. Jorene Minors office Bone density: 11/21/2022 T-score: -1.5 (in 2022: T score -1.2:) Mild osteopenia: Calcium and vitamin D  Breast exam 06/08/2023: Benign  I recommended that we obtain a breast MRI every 3 years because of her breast reconstruction.  She is willing to do that in January or February 2025.   Return to clinic in 1 year for follow-up     Orders Placed This Encounter  Procedures   MR BREAST BILATERAL W WO CONTRAST INC CAD    Standing Status:   Future    Standing Expiration Date:   08/09/2023    Order Specific Question:   If indicated for the ordered procedure, I authorize the administration of contrast media  per Radiology protocol    Answer:   Yes    Order Specific Question:   What is the patient's sedation requirement?    Answer:   No Sedation    Order Specific Question:   Does the patient have a pacemaker or implanted devices?    Answer:   No    Order Specific Question:   Preferred imaging location?    Answer:   [facility] (table limit-550lbs)  Order Specific Question:   Release to patient    Answer:   Immediate   The patient has a good understanding of the overall plan. she agrees with it. she will call with any problems that may develop before the next visit here. Total time spent: 30 mins including face to face time and time spent for planning, charting and co-ordination of care   Tamsen Meek, MD 06/08/23

## 2023-06-08 NOTE — Assessment & Plan Note (Signed)
06/27/2019: Right lumpectomy Carolynne Edouard): IDC, grade 3, at least 2.5cm, involving a complex sclerosing lesion, 1.1cm, with high grade DCIS, involved margins, 1/3 right axillary lymph nodes positive, 1.4cm.  ER 100%, PR 30%, Ki-67 20%, HER-2 IHC negative T2 N1 stage IIA MammaPrint: High risk, 10-year risk of recurrence untreated: 29% 09/08/2019:Re-excision x2 (08/01/19 and 08/12/19) followed by right mastectomy Carolynne Edouard): residual carcinoma and DCIS, 1/5 lymph nodes positive, clear margins.    Treatment plan: 1. Adjuvant chemotherapy with Taxotere and Cytoxan every 3 weeks x4 completed 12/15/2019 2.  Adjuvant radiation therapy completed 03/16/2020 3.  Follow-up adjuvant antiestrogen therapy with anastrozole 1 mg daily x7 years Genetic testing ------------------------------------------------------------------------------------------------------ Treatment plan: Plan for adjuvant antiestrogen therapy with anastrozole x7 years started September 2021   Bilateral lower extremity swelling: 01/26/2020: Echocardiogram EF 60 to 65% 01/21/2020: Ultrasound lower extremity: No DVT Continue with Lasix as needed   Chemo-induced peripheral neuropathy:  Anastrozole Toxicities: Hot flashes.  Sent prescription for [employer] .   Severe pain in the reconstructed right breast: Capsular contracture.   She had revision reconstruction at [facility] and a reduction.  She is doing much better.  Small area of fat necrosis was present   Breast Cancer Surveillance: Mammogram:  At Dr. Jorene Minors office Bone density: 11/21/2022 T-score: -1.5 (in 2022: T score -1.2:) Mild osteopenia: Calcium and vitamin D  Breast exam 06/08/2023: Benign   Return to clinic in 1 year for follow-up

## 2023-06-25 ENCOUNTER — Other Ambulatory Visit: Payer: Self-pay | Admitting: Hematology and Oncology

## 2023-06-29 ENCOUNTER — Telehealth: Payer: Self-pay

## 2023-06-29 NOTE — Telephone Encounter (Signed)
Notified Patient by voicemail of prior authorization for Veozah 45 mg Tablets. Medication is approved through 06/28/2024. Pharmacy notified. No other needs or concerns voiced at this time.

## 2023-07-28 ENCOUNTER — Encounter: Payer: Self-pay | Admitting: Hematology and Oncology

## 2023-07-30 ENCOUNTER — Encounter: Payer: Self-pay | Admitting: Hematology and Oncology

## 2023-08-04 ENCOUNTER — Ambulatory Visit
Admission: RE | Admit: 2023-08-04 | Discharge: 2023-08-04 | Disposition: A | Discharge status: Home / Self Care | Payer: No Typology Code available for payment source | Source: Ambulatory Visit | Attending: Hematology and Oncology

## 2023-08-04 DIAGNOSIS — Z170 Estrogen receptor positive status [ER+]: Secondary | ICD-10-CM

## 2023-08-07 ENCOUNTER — Ambulatory Visit (HOSPITAL_COMMUNITY)
Admission: RE | Admit: 2023-08-07 | Discharge: 2023-08-07 | Disposition: A | Discharge status: Home / Self Care | Payer: No Typology Code available for payment source | Source: Ambulatory Visit | Attending: Hematology and Oncology | Admitting: Hematology and Oncology

## 2023-08-07 ENCOUNTER — Other Ambulatory Visit: Payer: No Typology Code available for payment source

## 2023-08-07 DIAGNOSIS — C50411 Malignant neoplasm of upper-outer quadrant of right female breast: Secondary | ICD-10-CM | POA: Diagnosis present

## 2023-08-07 DIAGNOSIS — Z170 Estrogen receptor positive status [ER+]: Secondary | ICD-10-CM | POA: Insufficient documentation

## 2023-08-07 MED ORDER — GADOBUTROL 1 MMOL/ML IV SOLN
10.0000 mL | Freq: Once | INTRAVENOUS | Status: AC | PRN
  Administered 2023-08-07: 10 mL via INTRAVENOUS

## 2023-08-10 ENCOUNTER — Telehealth: Payer: Self-pay | Admitting: Hematology and Oncology

## 2023-08-10 NOTE — Telephone Encounter (Signed)
I left a voicemail for the patient that the breast MRI was normal.

## 2023-09-05 HISTORY — PX: CATARACT EXTRACTION: SUR2

## 2023-09-22 DIAGNOSIS — N951 Menopausal and female climacteric states: Secondary | ICD-10-CM | POA: Insufficient documentation

## 2023-12-22 DIAGNOSIS — Z853 Personal history of malignant neoplasm of breast: Secondary | ICD-10-CM | POA: Insufficient documentation

## 2023-12-22 HISTORY — PX: BREAST RECONSTRUCTION: SHX9

## 2023-12-26 ENCOUNTER — Other Ambulatory Visit: Payer: Self-pay | Admitting: Hematology and Oncology

## 2024-02-29 DIAGNOSIS — M65312 Trigger thumb, left thumb: Secondary | ICD-10-CM | POA: Insufficient documentation

## 2024-02-29 DIAGNOSIS — M79645 Pain in left finger(s): Secondary | ICD-10-CM | POA: Insufficient documentation

## 2024-04-26 ENCOUNTER — Other Ambulatory Visit: Payer: Self-pay | Admitting: Hematology and Oncology

## 2024-04-26 DIAGNOSIS — C50411 Malignant neoplasm of upper-outer quadrant of right female breast: Secondary | ICD-10-CM

## 2024-04-28 NOTE — Telephone Encounter (Signed)
 MD discontinued 06/08/2023.  RN attempt to contact pt, no answer.

## 2024-05-10 ENCOUNTER — Other Ambulatory Visit: Payer: Self-pay | Admitting: Hematology and Oncology

## 2024-05-27 ENCOUNTER — Ambulatory Visit (INDEPENDENT_AMBULATORY_CARE_PROVIDER_SITE_OTHER): Payer: No Typology Code available for payment source | Admitting: Family Medicine

## 2024-05-27 ENCOUNTER — Encounter: Payer: Self-pay | Admitting: Family Medicine

## 2024-05-27 VITALS — BP 128/68 | HR 62 | Temp 98.2°F | Resp 15 | Ht 66.25 in | Wt 210.6 lb

## 2024-05-27 DIAGNOSIS — Z860100 Personal history of colon polyps, unspecified: Secondary | ICD-10-CM | POA: Insufficient documentation

## 2024-05-27 DIAGNOSIS — Z85038 Personal history of other malignant neoplasm of large intestine: Secondary | ICD-10-CM | POA: Insufficient documentation

## 2024-05-27 DIAGNOSIS — M2550 Pain in unspecified joint: Secondary | ICD-10-CM

## 2024-05-27 DIAGNOSIS — Z0000 Encounter for general adult medical examination without abnormal findings: Secondary | ICD-10-CM

## 2024-05-27 DIAGNOSIS — L853 Xerosis cutis: Secondary | ICD-10-CM | POA: Diagnosis not present

## 2024-05-27 DIAGNOSIS — E785 Hyperlipidemia, unspecified: Secondary | ICD-10-CM | POA: Diagnosis not present

## 2024-05-27 DIAGNOSIS — R7303 Prediabetes: Secondary | ICD-10-CM

## 2024-05-27 DIAGNOSIS — G629 Polyneuropathy, unspecified: Secondary | ICD-10-CM

## 2024-05-27 DIAGNOSIS — R232 Flushing: Secondary | ICD-10-CM

## 2024-05-27 LAB — COMPREHENSIVE METABOLIC PANEL WITH GFR
ALT: 8 U/L (ref 0–35)
AST: 21 U/L (ref 0–37)
Albumin: 4.3 g/dL (ref 3.5–5.2)
Alkaline Phosphatase: 86 U/L (ref 39–117)
BUN: 7 mg/dL (ref 6–23)
CO2: 27 meq/L (ref 19–32)
Calcium: 9.6 mg/dL (ref 8.4–10.5)
Chloride: 107 meq/L (ref 96–112)
Creatinine, Ser: 0.66 mg/dL (ref 0.40–1.20)
GFR: 91.34 mL/min (ref >60.00)
Glucose, Bld: 100 mg/dL — ABNORMAL HIGH (ref 70–99)
Potassium: 4.7 meq/L (ref 3.5–5.1)
Sodium: 143 meq/L (ref 135–145)
Total Bilirubin: 0.7 mg/dL (ref 0.2–1.2)
Total Protein: 7.6 g/dL (ref 6.0–8.3)

## 2024-05-27 LAB — LIPID PANEL
Cholesterol: 223 mg/dL — ABNORMAL HIGH (ref 0–200)
HDL: 68.3 mg/dL (ref >39.00)
LDL Cholesterol: 138 mg/dL — ABNORMAL HIGH (ref 0–99)
NonHDL: 154.49
Total CHOL/HDL Ratio: 3
Triglycerides: 84 mg/dL (ref 0.0–149.0)
VLDL: 16.8 mg/dL (ref 0.0–40.0)

## 2024-05-27 LAB — HEMOGLOBIN A1C: Hgb A1c MFr Bld: 6.2 % (ref 4.6–6.5)

## 2024-05-27 NOTE — Patient Instructions (Signed)
 Tylenol  is fine for episodic joint aches.  Some patients and found glucosamine/chondroitin or turmeric helpful for arthritis pain.  That would be reasonable, but clear any new medications with your oncologist including any herbal supplements with your current medication.  I did place a referral to dermatology but I do not see any definitive rash on the extremities that would suggest psoriasis.  Make sure to ask the dermatologist about that concern when being evaluated for the dry skin of the scalp and ears.  Hydrating lotion to those areas for now like CeraVe, Eucerin or Cetaphil. If any concerns on labs I will let you know.  Thank you for coming in today and please let me know if there are questions.  Health Maintenance After Age 3 After age 64, you are at a higher risk for certain long-term diseases and infections as well as injuries from falls. Falls are a major cause of broken bones and head injuries in people who are older than age 97. Getting regular preventive care can help to keep you healthy and well. Preventive care includes getting regular testing and making lifestyle changes as recommended by your health care provider. Talk with your health care provider about: Which screenings and tests you should have. A screening is a test that checks for a disease when you have no symptoms. A diet and exercise plan that is right for you. What should I know about screenings and tests to prevent falls? Screening and testing are the best ways to find a health problem early. Early diagnosis and treatment give you the best chance of managing medical conditions that are common after age 38. Certain conditions and lifestyle choices may make you more likely to have a fall. Your health care provider may recommend: Regular vision checks. Poor vision and conditions such as cataracts can make you more likely to have a fall. If you wear glasses, make sure to get your prescription updated if your vision  changes. Medicine review. Work with your health care provider to regularly review all of the medicines you are taking, including over-the-counter medicines. Ask your health care provider about any side effects that may make you more likely to have a fall. Tell your health care provider if any medicines that you take make you feel dizzy or sleepy. Strength and balance checks. Your health care provider may recommend certain tests to check your strength and balance while standing, walking, or changing positions. Foot health exam. Foot pain and numbness, as well as not wearing proper footwear, can make you more likely to have a fall. Screenings, including: Osteoporosis screening. Osteoporosis is a condition that causes the bones to get weaker and break more easily. Blood pressure screening. Blood pressure changes and medicines to control blood pressure can make you feel dizzy. Depression screening. You may be more likely to have a fall if you have a fear of falling, feel depressed, or feel unable to do activities that you used to do. Alcohol use screening. Using too much alcohol can affect your balance and may make you more likely to have a fall. Follow these instructions at home: Lifestyle Do not drink alcohol if: Your health care provider tells you not to drink. If you drink alcohol: Limit how much you have to: 0-1 drink a day for women. 0-2 drinks a day for men. Know how much alcohol is in your drink. In the [place] one drink equals one 12 oz bottle of beer (355 mL), one 5 oz glass of wine (148 mL),  or one 1 oz glass of hard liquor (44 mL). Do not use any products that contain nicotine or tobacco. These products include cigarettes, chewing tobacco, and vaping devices, such as e-cigarettes. If you need help quitting, ask your health care provider. Activity  Follow a regular exercise program to stay fit. This will help you maintain your balance. Ask your health care provider what types of exercise  are appropriate for you. If you need a cane or walker, use it as recommended by your health care provider. Wear supportive shoes that have nonskid soles. Safety  Remove any tripping hazards, such as rugs, cords, and clutter. Install safety equipment such as grab bars in bathrooms and safety rails on stairs. Keep rooms and walkways well-lit. General instructions Talk with your health care provider about your risks for falling. Tell your health care provider if: You fall. Be sure to tell your health care provider about all falls, even ones that seem minor. You feel dizzy, tiredness (fatigue), or off-balance. Take over-the-counter and prescription medicines only as told by your health care provider. These include supplements. Eat a healthy diet and maintain a healthy weight. A healthy diet includes low-fat dairy products, low-fat (lean) meats, and fiber from whole grains, beans, and lots of fruits and vegetables. Stay current with your vaccines. Schedule regular health, dental, and eye exams. Summary Having a healthy lifestyle and getting preventive care can help to protect your health and wellness after age 75. Screening and testing are the best way to find a health problem early and help you avoid having a fall. Early diagnosis and treatment give you the best chance for managing medical conditions that are more common for people who are older than age 80. Falls are a major cause of broken bones and head injuries in people who are older than age 41. Take precautions to prevent a fall at home. Work with your health care provider to learn what changes you can make to improve your health and wellness and to prevent falls. This information is not intended to replace advice given to you by your health care provider. Make sure you discuss any questions you have with your health care provider. Document Revised: 11/12/2020 Document Reviewed: 11/12/2020 Elsevier Patient Education  2024 [employer].

## 2024-05-27 NOTE — Progress Notes (Signed)
 Subjective:  Patient ID: Wendy [name], female    DOB: June 14, 1956  Age: 68 y.o. MRN: [id number]  CC:  Chief Complaint  Patient presents with   Annual Exam    Pt notes some aches and swelling in her joints, notes she has arthritis didn't know if there was a recommendation for managing this, otherwise doing well, pt is fasting     HPI Wendy [name] presents for Annual Exam  Various arthralgias as above but no acute injuries or acute concerns otherwise. Knees, hips, stiff in am, better during day, sore at end of day.  Dry scalp for years - has seen derm - no diagnosis of psoriasis. 3 years ago.  No new rash.  Tx: tylenol  2-3 times per week - helps.  No herbal supplements.   PCP, me Ortho/hand, Dr. Jackye, trigger finger, left thumb.  Plastic surgery, Dr. Merita at [facility].  History of breast reconstruction.  June 17. Oncology, Dr. Odean, right breast cancer diagnosed in 2020.  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 Did see in May. Had pap and mammogram. Has not seen new GYN yet.   Hyperlipidemia: Diet/exercise approach.  Borderline/slight elevated ASCVD risk score based on prior labs, has lost weight.  The 10-year ASCVD risk score (Arnett DK, et al., 2019) is: 9.6%   Values used to calculate the score:     Age: 43 years     Clincally relevant sex: Female     Is Non-Hispanic African American: Yes     Diabetic: No     Tobacco smoker: No     Systolic Blood Pressure: 128 mmHg     Is BP treated: No     HDL Cholesterol: 67.3 mg/dL     Total Cholesterol: 235 mg/dL  Lab Results  Component Value Date   CHOL 235 (H) 05/15/2023   HDL 67.30 05/15/2023   LDLCALC 149 (H) 05/15/2023   TRIG 91.0 05/15/2023   CHOLHDL 3 05/15/2023   Lab Results  Component Value Date   ALT 9 05/15/2023   AST 17 05/15/2023   ALKPHOS 102 05/15/2023   BILITOT 0.8  05/15/2023    Prediabetes: As above, diet/exercise approach.  Weight has improved from last year. Lab Results  Component Value Date   HGBA1C 6.3 05/15/2023   Wt Readings from Last 3 Encounters:  05/27/24 210 lb 9.6 oz (95.5 kg)  06/08/23 217 lb 6.4 oz (98.6 kg)  05/15/23 219 lb 9.6 oz (99.6 kg)   History of edema Prior mastectomy, has used lymphedema pump for upper extremity edema along with furosemide  as needed from oncology previously.  Normal EF on echo and no DVT on ultrasound with previous pedal edema and legs.  Rare use of furosemide  and stable when discussed last November. No recent need for furosemide . Using lymphedema pump.   History of hot flashes Treated with Veozah .  Stable.   Neuropathy secondary to chemotherapy Treat with gabapentin  300 mg at bedtime when last discussed a year ago, daytime somnolence if a.m. dosing. Still effective.       05/15/2023    9:43 AM 11/10/2022    9:18 AM 08/13/2022   11:38 AM 05/16/2022    2:36 PM 06/27/2021    3:50 PM  Depression screen PHQ 2/9  Decreased Interest 0 0 0 0 0  Down, Depressed, Hopeless 0 0 0 0 0  PHQ - 2 Score 0 0 0  0 0  Altered sleeping 0 0  0 0  Tired, decreased energy 1 0  0 0  Change in appetite 0 0  0 0  Feeling bad or failure about yourself  0 0  0 0  Trouble concentrating 0 0  0 0  Moving slowly or fidgety/restless 0 0  0 0  Suicidal thoughts 0 0  0 0  PHQ-9 Score 1  0   0  0   Difficult doing work/chores  Not difficult at all  Not difficult at all Not difficult at all     Data saved with a previous flowsheet row definition    Health Maintenance  Topic Date Due   Influenza Vaccine  10/04/2024 (Originally 02/05/2024)   Pneumococcal Vaccine: 50+ Years (1 of 1 - PCV) 05/27/2025 (Originally 11/18/2007)   Mammogram  08/06/2024   Bone Density Scan  11/20/2024   DTaP/Tdap/Td (2 - Td or Tdap) 09/01/2027   Colonoscopy  11/22/2031   Hepatitis C Screening  Completed   Zoster Vaccines- Shingrix   Completed    Meningococcal B Vaccine  Aged Out   COVID-19 Vaccine  Discontinued  Colonoscopy 11/21/2021. Repeat 3 yrs, [facility].  Mammogram: Breast MRI 08/04/2023.  No suspicious enhancement.mammogram at GYN in May - no concerns.  Pap in May - GYN. Told was ok.   Immunization History  Administered Date(s) Administered   Influenza Split 04/06/2012   Influenza,inj,Quad PF,6+ Mos 08/31/2017, 04/30/2018, 05/13/2019   Influenza-Unspecified 04/18/2015   PFIZER Comirnaty(Gray Top)Covid-19 Tri-Sucrose Vaccine 01/01/2021   PFIZER(Purple Top)SARS-COV-2 Vaccination 09/20/2019, 10/11/2019   Tdap 08/31/2017   Tetanus 07/07/2002   Zoster Recombinant(Shingrix ) 11/10/2022, 05/15/2023  Flu vaccine - declines.  Covid booster - declines.  Declines pneumonia vaccine.   No results found.  Optho in March - Dr. Medford Gaudy, prior cataracts.   Dental: every 6 months  Alcohol: rare.   Tobacco: none  Exercise: none. Busy with work.    History Patient Active Problem List   Diagnosis Date Noted   History of colonic polyps 05/27/2024   Personal history of other malignant neoplasm of large intestine 05/27/2024   Pain of left thumb 02/29/2024   Trigger thumb of left hand 02/29/2024   History of malignant neoplasm of breast 12/22/2023   Menopausal and female climacteric states 09/22/2023   Osteopenia 11/25/2022   Atypical squamous cells of undetermined significance (ASCUS) on Papanicolaou smear of cervix 07/25/2022   High-risk human papillomavirus (HPV) DNA detected in cervical specimen 07/25/2022   Genital herpes simplex 08/02/2020   Mixed hyperlipidemia 08/02/2020   Postmenopausal bleeding 08/02/2020   S/P breast reconstruction, right 01/23/2020   History of reduction surgery of left breast 01/23/2020   Port-A-Cath in place 10/20/2019   S/P mastectomy, right 10/19/2019   Acquired absence of breast 09/16/2019   Breast cancer (HCC) 09/08/2019   Genetic testing 06/10/2019   Malignant neoplasm of upper-outer  quadrant of right breast in female, estrogen receptor positive (HCC) 06/01/2019   Family history of breast cancer 06/01/2019   Family history of colon cancer 06/01/2019   Family history of brain cancer 06/01/2019   History of colon cancer    Abnormal colonoscopy 10/30/2017   Chronic glaucoma 08/11/2013   HELICOBACTER PYLORI INFECTION 08/27/2007   GERD 08/27/2007   ESOPHAGITIS, REFLUX 05/17/2007   HEMORRHOIDS, INTERNAL 09/22/2003   Past Medical History:  Diagnosis Date   Breast cancer (HCC)    Colon cancer (HCC)    DDD (degenerative disc disease), cervical    DDD (degenerative disc disease),  lumbar    ESOPHAGITIS, REFLUX    Glaucoma    HELICOBACTER PYLORI INFECTION    HEMORRHOIDS, INTERNAL    History of colon cancer    History of colon polyps 2019   Hyperlipidemia    Radius fracture 2006   Left, Distal   Thoracic spondylosis    Tinnitus, bilateral 2013   Past Surgical History:  Procedure Laterality Date   BREAST LUMPECTOMY WITH RADIOACTIVE SEED AND SENTINEL LYMPH NODE BIOPSY Right 06/27/2019   Procedure: RIGHT BREAST LUMPECTOMY WITH RADIOACTIVE SEED AND SENTINEL LYMPH NODE BIOPSY AND RIGHT TARGETED NODE DISSECTION;  Surgeon: Curvin Deward MOULD, MD;  Location: [facility];  Service: General;  Laterality: Right;   BREAST RECONSTRUCTION Right 12/22/2023   BREAST RECONSTRUCTION WITH PLACEMENT OF TISSUE EXPANDER AND FLEX HD (ACELLULAR HYDRATED DERMIS) Right 09/08/2019   Procedure: BREAST RECONSTRUCTION WITH PLACEMENT OF TISSUE EXPANDER AND FLEX HD (ACELLULAR HYDRATED DERMIS);  Surgeon: Lowery Estefana RAMAN, DO;  Location: [facility];  Service: Plastics;  Laterality: Right;   BREAST REDUCTION WITH MASTOPEXY Left 01/02/2020   Procedure: MASTOPEXY / REDUCTION FOR SYMMETRY;  Surgeon: Lowery Estefana RAMAN, DO;  Location: [facility];  Service: Plastics;  Laterality: Left;   CATARACT EXTRACTION Bilateral 09/2023   CESAREAN SECTION  1989    COLONOSCOPY  2019   HYSTEROSCOPY WITH D & C N/A 09/17/2018   Procedure: DILATATION AND CURETTAGE LELDON NAIL;  Surgeon: Gorge Ade, MD;  Location: [facility];  Service: Gynecology;  Laterality: N/A;   PORT-A-CATH REMOVAL Left 01/02/2020   Procedure: REMOVAL PORT-A-CATH;  Surgeon: Lowery Estefana RAMAN, DO;  Location: [facility];  Service: Plastics;  Laterality: Left;   PORTACATH PLACEMENT Left 08/01/2019   Procedure: INSERTION PORT-A-CATH;  Surgeon: Curvin Deward MOULD, MD;  Location: [facility];  Service: General;  Laterality: Left;   RE-EXCISION OF BREAST CANCER,SUPERIOR MARGINS Right 08/12/2019   Procedure: RE-EXCISION OF RIGHT BREAST,SUPERIOR MARGINS;  Surgeon: Curvin Deward MOULD, MD;  Location: [facility];  Service: General;  Laterality: Right;   RE-EXCISION OF BREAST LUMPECTOMY Right 08/01/2019   Procedure: RE-EXCISION OF RIGHT BREAST CANCER  MARGINS;  Surgeon: Curvin Deward MOULD, MD;  Location: [facility];  Service: General;  Laterality: Right;   REMOVAL OF TISSUE EXPANDER AND PLACEMENT OF IMPLANT Right 01/02/2020   Procedure: REMOVAL OF TISSUE EXPANDER AND PLACEMENT OF IMPLANT;  Surgeon: Lowery Estefana RAMAN, DO;  Location: [facility];  Service: Plastics;  Laterality: Right;   SIMPLE MASTECTOMY WITH AXILLARY SENTINEL NODE BIOPSY Right 09/08/2019   Procedure: RIGHT BREAST MASTECTOMY;  Surgeon: Curvin Deward MOULD, MD;  Location:  SURGERY CENTER;  Service: General;  Laterality: Right;   TONSILLECTOMY     age 26 years old   TRIGGER FINGER RELEASE Right 11/01/2021   WRIST FRACTURE SURGERY  2006   Allergies  Allergen Reactions   Hydrocodone -Acetaminophen  Itching and Other (See Comments)    SEVERE     Oxycodone  Itching   Other Other (See Comments)    rakuten eyedrops causes eye redness    Vicodin [Hydrocodone -Acetaminophen ] Itching    SEVERE    Brimonidine Rash   Prior to Admission  medications   Medication Sig Start Date End Date Taking? Authorizing Provider  anastrozole  (ARIMIDEX ) 1 MG tablet TAKE 1 TABLET BY MOUTH EVERY DAY 05/10/24  Yes Gudena, Vinay, MD  dorzolamide -timolol  (COSOPT ) 22.3-6.8 MG/ML ophthalmic solution PLACE 1 DROP INTO BOTH EYES 2 (TWO) TIMES DAILY. 09/28/13  Yes [provider]  gabapentin  (NEURONTIN ) 300 MG capsule TAKE 1 CAPSULE BY MOUTH TWICE A DAY 11/15/21  Yes Gudena, Vinay, MD  furosemide  (LASIX ) 20 MG tablet TAKE 1 TABLET BY MOUTH TWICE A DAY Patient not taking: Reported on 05/27/2024 12/20/21   Gudena, Vinay, MD  gabapentin  (NEURONTIN ) 100 MG capsule Take 1 capsule (100 mg total) by mouth in the morning. Continue 300mg  dose at night. Patient not taking: Reported on 05/27/2024 05/15/23   Levora Reyes SAUNDERS, MD  Latanoprostene Bunod (VYZULTA) 0.024 % SOLN INSTILL 1 DROP INTO BOTH EYES EVERY NIGHT AT BEDTIME    [provider]  promethazine -dextromethorphan (PROMETHAZINE -DM) 6.25-15 MG/5ML syrup Take 2.5-5 mLs by mouth 4 (four) times daily as needed for cough. Patient not taking: Reported on 05/27/2024 08/26/22   Levora Reyes SAUNDERS, MD  VEOZAH  45 MG TABS TAKE 1 TABLET BY MOUTH EVERY DAY 12/28/23   Odean Potts, MD   Social History   Socioeconomic History   Marital status: Divorced    Spouse name: Not on file   Number of children: Not on file   Years of education: Not on file   Highest education level: Not on file  Occupational History   Occupation: medical    Employer: [employer]  Tobacco Use   Smoking status: Never   Smokeless tobacco: Never  Substance and Sexual Activity   Alcohol use: Yes    Comment: occ   Drug use: No   Sexual activity: Not Currently  Other Topics Concern   Not on file  Social History Narrative   Not on file   Social Drivers of Health   Financial Resource Strain: Not on file  Food Insecurity: Not on file  Transportation Needs: Not on file  Physical Activity: Not on file  Stress:  No Stress Concern Present (11/14/2021)   Received from [facility] - Occupational Stress Questionnaire    Feeling of Stress : Not at all  Social Connections: Not on file  Intimate Partner Violence: Not on file    Review of Systems 13 point review of systems per patient health survey noted.  Negative other than as indicated above or in HPI.    Objective:   Vitals:   05/27/24 0802  BP: 128/68  Pulse: 62  Resp: 15  Temp: 98.2 F (36.8 C)  TempSrc: Temporal  SpO2: 100%  Weight: 210 lb 9.6 oz (95.5 kg)  Height: 5' 6.25 (1.683 m)     Physical Exam Vitals reviewed.  Constitutional:      Appearance: She is well-developed.  HENT:     Head: Normocephalic and atraumatic.     Right Ear: External ear normal.     Left Ear: External ear normal.  Eyes:     Conjunctiva/sclera: Conjunctivae normal.     Pupils: Pupils are equal, round, and reactive to light.  Neck:     Thyroid : No thyromegaly.  Cardiovascular:     Rate and Rhythm: Normal rate and regular rhythm.     Heart sounds: Normal heart sounds. No murmur heard. Pulmonary:     Effort: Pulmonary effort is normal. No respiratory distress.     Breath sounds: Normal breath sounds. No wheezing.  Abdominal:     General: Bowel sounds are normal.     Palpations: Abdomen is soft.     Tenderness: There is no abdominal tenderness.  Musculoskeletal:        General: No tenderness. Normal range of motion.  Cervical back: Normal range of motion and neck supple.  Lymphadenopathy:     Cervical: No cervical adenopathy.  Skin:    General: Skin is warm and dry.     Comments: Few patches dry skin of scalp, posterior and entrance of auditory canals. No other rash or rash of extremities appreciated.   Neurological:     Mental Status: She is alert and oriented to person, place, and time.  Psychiatric:        Behavior: Behavior normal.        Thought Content: Thought content normal.         Assessment & Plan:  Wendy [name] is a 68 y.o. female . Annual physical exam  - -anticipatory guidance as below in AVS, screening labs above. Health maintenance items as above in HPI discussed/recommended as applicable.   Dry skin - Plan: Ambulatory referral to Dermatology  - Referred to dermatology, without extremity rash less likely psoriasis but advised that she discuss this with dermatology, especially with concern of arthralgias and her concern of possible psoriatic arthritis but that would be less likely.  Arthralgia, unspecified joint - Plan: Ambulatory referral to Dermatology  - Based on timing of symptoms, suspected osteoarthritis.  Tylenol  is fine for now, over-the-counter glucosamine, conjoint and or turmeric it are an option but asked that she clear that with her oncologist.  Follow-up if not improving for specific visit for those areas.  Prediabetes - Plan: Hemoglobin A1c  - Commended on weight loss, check A1c.  Low intensity exercise discussed with start low/go slow approach as well as FITT principle and adjust plan accordingly based on labs.  Hyperlipidemia, unspecified hyperlipidemia type - Plan: Comprehensive metabolic panel with GFR, Lipid panel  - Borderline ASCVD risk score, check labs and adjust plan accordingly, anticipate improvement with weight loss.  Commended on efforts as above.  Neuropathy  - Stable with gabapentin , continue same  Hot flashes  - Stable on Veozah , continue same  No orders of the defined types were placed in this encounter.  Patient Instructions  Tylenol  is fine for episodic joint aches.  Some patients and found glucosamine/chondroitin or turmeric helpful for arthritis pain.  That would be reasonable, but clear any new medications with your oncologist including any herbal supplements with your current medication.  I did place a referral to dermatology but I do not see any definitive rash on the extremities that would suggest  psoriasis.  Make sure to ask the dermatologist about that concern when being evaluated for the dry skin of the scalp and ears.  Hydrating lotion to those areas for now like CeraVe, Eucerin or Cetaphil. If any concerns on labs I will let you know.  Thank you for coming in today and please let me know if there are questions.  Health Maintenance After Age 42 After age 26, you are at a higher risk for certain long-term diseases and infections as well as injuries from falls. Falls are a major cause of broken bones and head injuries in people who are older than age 21. Getting regular preventive care can help to keep you healthy and well. Preventive care includes getting regular testing and making lifestyle changes as recommended by your health care provider. Talk with your health care provider about: Which screenings and tests you should have. A screening is a test that checks for a disease when you have no symptoms. A diet and exercise plan that is right for you. What should I know about screenings and tests  to prevent falls? Screening and testing are the best ways to find a health problem early. Early diagnosis and treatment give you the best chance of managing medical conditions that are common after age 44. Certain conditions and lifestyle choices may make you more likely to have a fall. Your health care provider may recommend: Regular vision checks. Poor vision and conditions such as cataracts can make you more likely to have a fall. If you wear glasses, make sure to get your prescription updated if your vision changes. Medicine review. Work with your health care provider to regularly review all of the medicines you are taking, including over-the-counter medicines. Ask your health care provider about any side effects that may make you more likely to have a fall. Tell your health care provider if any medicines that you take make you feel dizzy or sleepy. Strength and balance checks. Your health care  provider may recommend certain tests to check your strength and balance while standing, walking, or changing positions. Foot health exam. Foot pain and numbness, as well as not wearing proper footwear, can make you more likely to have a fall. Screenings, including: Osteoporosis screening. Osteoporosis is a condition that causes the bones to get weaker and break more easily. Blood pressure screening. Blood pressure changes and medicines to control blood pressure can make you feel dizzy. Depression screening. You may be more likely to have a fall if you have a fear of falling, feel depressed, or feel unable to do activities that you used to do. Alcohol use screening. Using too much alcohol can affect your balance and may make you more likely to have a fall. Follow these instructions at home: Lifestyle Do not drink alcohol if: Your health care provider tells you not to drink. If you drink alcohol: Limit how much you have to: 0-1 drink a day for women. 0-2 drinks a day for men. Know how much alcohol is in your drink. In the [place] one drink equals one 12 oz bottle of beer (355 mL), one 5 oz glass of wine (148 mL), or one 1 oz glass of hard liquor (44 mL). Do not use any products that contain nicotine or tobacco. These products include cigarettes, chewing tobacco, and vaping devices, such as e-cigarettes. If you need help quitting, ask your health care provider. Activity  Follow a regular exercise program to stay fit. This will help you maintain your balance. Ask your health care provider what types of exercise are appropriate for you. If you need a cane or walker, use it as recommended by your health care provider. Wear supportive shoes that have nonskid soles. Safety  Remove any tripping hazards, such as rugs, cords, and clutter. Install safety equipment such as grab bars in bathrooms and safety rails on stairs. Keep rooms and walkways well-lit. General instructions Talk with your health  care provider about your risks for falling. Tell your health care provider if: You fall. Be sure to tell your health care provider about all falls, even ones that seem minor. You feel dizzy, tiredness (fatigue), or off-balance. Take over-the-counter and prescription medicines only as told by your health care provider. These include supplements. Eat a healthy diet and maintain a healthy weight. A healthy diet includes low-fat dairy products, low-fat (lean) meats, and fiber from whole grains, beans, and lots of fruits and vegetables. Stay current with your vaccines. Schedule regular health, dental, and eye exams. Summary Having a healthy lifestyle and getting preventive care can help to protect your health and  wellness after age 67. Screening and testing are the best way to find a health problem early and help you avoid having a fall. Early diagnosis and treatment give you the best chance for managing medical conditions that are more common for people who are older than age 90. Falls are a major cause of broken bones and head injuries in people who are older than age 39. Take precautions to prevent a fall at home. Work with your health care provider to learn what changes you can make to improve your health and wellness and to prevent falls. This information is not intended to replace advice given to you by your health care provider. Make sure you discuss any questions you have with your health care provider. Document Revised: 11/12/2020 Document Reviewed: 11/12/2020 Elsevier Patient Education  2024 Elsevier Inc.    Signed,   Reyes Pines, MD [facility], [facility] 05/27/24 8:42 AM

## 2024-05-30 ENCOUNTER — Other Ambulatory Visit (HOSPITAL_COMMUNITY): Payer: Self-pay

## 2024-05-30 ENCOUNTER — Telehealth: Payer: Self-pay

## 2024-05-30 NOTE — Telephone Encounter (Signed)
 Oral Oncology Patient Advocate Encounter   Received notification that prior authorization for VEOZAH   is due for renewal.   PA submitted on 05/30/2024 Key [id number] Status is pending      Charlott Hamilton,  CPhT-Adv  she/her/hers [facility]  [facility] Pharmacy Technician Patient Advocate Specialist III [facility] Phone: [phone]  Fax: [phone] [email or web address]

## 2024-05-30 NOTE — Telephone Encounter (Signed)
 Oral Oncology Patient Advocate Encounter  Prior Authorization RENEWAL  for VEOZAH   has been approved.    PA# [id number] Effective dates: 05/30/2024 through 05/30/2025    Charlott Hamilton,  CPhT-Adv  she/her/hers [place]  [facility] Pharmacy Technician Patient Advocate Specialist III [facility] Phone: [phone]  Fax: [phone] [email or web address]

## 2024-05-31 ENCOUNTER — Ambulatory Visit: Payer: Self-pay | Admitting: Family Medicine

## 2024-06-06 NOTE — Assessment & Plan Note (Signed)
 06/27/2019: Right lumpectomy [facility]): IDC, grade 3, at least 2.5cm, involving a complex sclerosing lesion, 1.1cm, with high grade DCIS, involved margins, 1/3 right axillary lymph nodes positive, 1.4cm.  ER 100%, PR 30%, Ki-67 20%, HER-2 IHC negative T2 N1 stage IIA MammaPrint: High risk, 10-year risk of recurrence untreated: 29% 09/08/2019:Re-excision x2 (08/01/19 and 08/12/19) followed by right mastectomy [facility]): residual carcinoma and DCIS, 1/5 lymph nodes positive, clear margins.    Treatment plan: 1. Adjuvant chemotherapy with Taxotere  and Cytoxan  every 3 weeks x4 completed 12/15/2019 2.  Adjuvant radiation therapy completed 03/16/2020 3.  Follow-up adjuvant antiestrogen therapy with anastrozole  1 mg daily x7 years Genetic testing ------------------------------------------------------------------------------------------------------ Treatment plan: Plan for adjuvant antiestrogen therapy with anastrozole  x7 years started September 2021   Bilateral lower extremity swelling: 01/26/2020: Echocardiogram EF 60 to 65% 01/21/2020: Ultrasound lower extremity: No DVT Continue with Lasix  as needed   Chemo-induced peripheral neuropathy:  Anastrozole  Toxicities: Hot flashes.  Sent prescription for Veozah .   Severe pain in the reconstructed right breast: Capsular contracture.   She had revision reconstruction at [facility] and a reduction.  She is doing much better.  Small area of fat necrosis was present   Breast Cancer Surveillance: Mammogram:  At Dr. Valri office Bone density: 11/21/2022 T-score: -1.5 (in 2022: T score -1.2:) Mild osteopenia: Calcium and vitamin D  Breast exam 06/07/2024: Benign Breast MRI 08/07/2023: Benign breast density category B   I recommended that we obtain a breast MRI every 3 years because of her breast reconstruction.  Next breast MRI will be done January 2028    Return to clinic in 1 year for follow-up

## 2024-06-06 NOTE — Progress Notes (Signed)
 Lab results have been discussed.   Verbalized understanding? Yes  Are there any questions? No

## 2024-06-07 ENCOUNTER — Inpatient Hospital Stay
Payer: No Typology Code available for payment source | Attending: Hematology and Oncology | Admitting: Hematology and Oncology

## 2024-06-07 VITALS — BP 143/72 | HR 78 | Temp 98.0°F | Resp 18 | Ht 66.25 in | Wt 209.0 lb

## 2024-06-07 DIAGNOSIS — C773 Secondary and unspecified malignant neoplasm of axilla and upper limb lymph nodes: Secondary | ICD-10-CM | POA: Insufficient documentation

## 2024-06-07 DIAGNOSIS — Z1732 Human epidermal growth factor receptor 2 negative status: Secondary | ICD-10-CM | POA: Diagnosis not present

## 2024-06-07 DIAGNOSIS — Z79811 Long term (current) use of aromatase inhibitors: Secondary | ICD-10-CM | POA: Insufficient documentation

## 2024-06-07 DIAGNOSIS — Z170 Estrogen receptor positive status [ER+]: Secondary | ICD-10-CM | POA: Insufficient documentation

## 2024-06-07 DIAGNOSIS — Z780 Asymptomatic menopausal state: Secondary | ICD-10-CM

## 2024-06-07 DIAGNOSIS — Z1721 Progesterone receptor positive status: Secondary | ICD-10-CM | POA: Diagnosis not present

## 2024-06-07 DIAGNOSIS — I890 Lymphedema, not elsewhere classified: Secondary | ICD-10-CM | POA: Insufficient documentation

## 2024-06-07 DIAGNOSIS — N951 Menopausal and female climacteric states: Secondary | ICD-10-CM | POA: Insufficient documentation

## 2024-06-07 DIAGNOSIS — C50411 Malignant neoplasm of upper-outer quadrant of right female breast: Secondary | ICD-10-CM | POA: Insufficient documentation

## 2024-06-07 DIAGNOSIS — M8580 Other specified disorders of bone density and structure, unspecified site: Secondary | ICD-10-CM | POA: Insufficient documentation

## 2024-06-07 NOTE — Progress Notes (Signed)
 Patient Care Team: Levora Reyes SAUNDERS, MD as PCP - General (Family Medicine) Curvin Deward MOULD, MD as Consulting Physician (General Surgery) Dewey Rush, MD as Consulting Physician (Radiation Oncology) Odean Potts, MD as Consulting Physician (Hematology and Oncology) Gorge Ade, MD as Consulting Physician (Obstetrics and Gynecology)  DIAGNOSIS:  Encounter Diagnosis  Name Primary?   Malignant neoplasm of upper-outer quadrant of right breast in female, estrogen receptor positive (HCC) Yes    SUMMARY OF ONCOLOGIC HISTORY: Oncology History  Malignant neoplasm of upper-outer quadrant of right breast in female, estrogen receptor positive (HCC)  05/20/2019 Initial Diagnosis   Palpable right breast mass superficial, ultrasound 11 o'clock position 1.7 cm: Biopsy revealed grade 3 IDC with DCIS ER 100%, PR 30%, Ki-67 20%, HER-2 by IHC negative, axilla lymph node biopsy positive   06/01/2019 Cancer Staging   Staging form: Breast, AJCC 8th Edition - Clinical stage from 06/01/2019: Stage IIA (cT1c, cN1, cM0, G3, ER+, PR+, HER2-)   06/27/2019 Surgery   Right lumpectomy [facility]) [id number]): IDC, grade 3, at least 2.5cm, involving a complex sclerosing lesion, 1.1cm, with high grade DCIS, involved margins, 1/3 right axillary lymph nodes positive, 1.4cm.    07/11/2019 Cancer Staging   Staging form: Breast, AJCC 8th Edition - Pathologic stage from 07/11/2019: Stage IIA (pT2, pN1a, cM0, G3, ER+, PR+, HER2-)   07/19/2019 Oncotype testing   Mammaprint: high risk, 29% chance of recurrence in 10 years without systemic treatment.    09/08/2019 Surgery   Re-excision x2 (08/01/19 and 08/12/19) [phone] and [id number])   Right mastectomy [facility]) [phone]): residual carcinoma and DCIS, 1/5 lymph nodes positive, clear margins.    10/14/2019 - 12/15/2019 Chemotherapy   dexamethasone  (DECADRON ) 4 MG tablet, 4 mg (100 % of original dose 4 mg), Oral, Daily, 1 of 1 cycle, Start date: 07/22/2019, End  date: 11/24/2019. Dose modification: 4 mg (original dose 4 mg, Cycle 0).  palonosetron  (ALOXI ) injection 0.25 mg, 0.25 mg, Intravenous,  Once, 4 of 4 cycles. Administration: 0.25 mg (10/14/2019), 0.25 mg (11/04/2019), 0.25 mg (11/24/2019), 0.25 mg (12/15/2019).  pegfilgrastim  (NEULASTA  ONPRO KIT) injection 6 mg, 6 mg, Subcutaneous, Once, 4 of 4 cycles. Administration: 6 mg (10/14/2019), 6 mg (11/04/2019), 6 mg (11/24/2019), 6 mg (12/15/2019).  cyclophosphamide  (CYTOXAN ) 1,260 mg in sodium chloride  0.9 % 250 mL chemo infusion, 600 mg/m2 = 1,260 mg, Intravenous,  Once, 4 of 4 cycles. Dose modification: 500 mg/m2 (original dose 600 mg/m2, Cycle 2, Reason: Dose not tolerated). Administration: 1,260 mg (10/14/2019), 1,040 mg (11/04/2019), 1,040 mg (11/24/2019), 1,040 mg (12/15/2019).  DOCEtaxel  (TAXOTERE ) 160 mg in sodium chloride  0.9 % 250 mL chemo infusion, 75 mg/m2 = 160 mg, Intravenous,  Once, 4 of 4 cycles. Dose modification: 65 mg/m2 (original dose 75 mg/m2, Cycle 2, Reason: Dose not tolerated). Administration: 160 mg (10/14/2019), 140 mg (11/04/2019), 140 mg (11/24/2019), 140 mg (12/15/2019).   01/31/2020 - 03/16/2020 Radiation Therapy   The patient initially received a dose of 50.4 Gy in 28 fractions to the chest wall and supraclavicular region. This was delivered using a 3-D conformal, 4 field technique. The patient then received a boost to the mastectomy scar. This delivered an additional 10 Gy in 5 fractions using an en face electron field. The total dose was 60.4 Gy.   03/2020 - 03/2027 Anti-estrogen oral therapy   Anastrozole      CHIEF COMPLIANT:   HISTORY OF PRESENT ILLNESS: Surveillance of breast cancer on anastrozole  therapy  History of Present Illness Wendy [name] is a 68 year old female  who presents for follow-up regarding her breast reconstruction and ongoing leg swelling.  She has chronic pain from capsular contracture after breast reconstruction. In June 2025 she had a DIEP flap with capsule  removal and now has markedly less pain and has recovered her baseline function.  She has taken anastrozole  since 2021 without significant toxicity, with only mild, manageable hot flashes. She also receives Vabysmo and takes Beoza.  She has persistent leg swelling. She uses compression socks intermittently but finds them hard to use regularly.  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     ALLERGIES:  is allergic to hydrocodone -acetaminophen , oxycodone , other, vicodin [hydrocodone -acetaminophen ], and brimonidine.  MEDICATIONS:  Current Outpatient Medications  Medication Sig Dispense Refill   anastrozole  (ARIMIDEX ) 1 MG tablet TAKE 1 TABLET BY MOUTH EVERY DAY 90 tablet 3   dorzolamide -timolol  (COSOPT ) 22.3-6.8 MG/ML ophthalmic solution PLACE 1 DROP INTO BOTH EYES 2 (TWO) TIMES DAILY.     gabapentin  (NEURONTIN ) 300 MG capsule TAKE 1 CAPSULE BY MOUTH TWICE A DAY 180 capsule 3   VEOZAH  45 MG TABS TAKE 1 TABLET BY MOUTH EVERY DAY 30 tablet 6   No current facility-administered medications for this visit.   Facility-Administered Medications Ordered in Other Visits  Medication Dose Route Frequency Provider Last Rate Last Admin   heparin  lock flush 100 unit/mL  500 Units Intravenous Once Betzaida Cremeens, MD       sodium chloride  flush (NS) 0.9 % injection 10 mL  10 mL Intravenous PRN Odean Potts, MD   10 mL at 10/14/19 1032    PHYSICAL EXAMINATION: ECOG PERFORMANCE STATUS: 1 - Symptomatic but completely ambulatory  Vitals:   06/07/24 0935  BP: (!) 143/72  Pulse: 78  Resp: 18  Temp: 98 F (36.7 C)  SpO2: 100%   Filed Weights   06/07/24 0935  Weight: 209 lb (94.8 kg)    Physical Exam Breast exam: No palpable lumps or nodules.  Right breast has been reconstructed with a DIEP flap.     (exam performed in the presence of a chaperone)  LABORATORY DATA:  I  have reviewed the data as listed    Latest Ref Rng & Units 05/27/2024    8:55 AM 05/15/2023   11:01 AM 11/10/2022   10:38 AM  CMP  Glucose 70 - 99 mg/dL 899  93  86   BUN 6 - 23 mg/dL 7  9  10    Creatinine 0.40 - 1.20 mg/dL 9.33  9.27  9.27   Sodium 135 - 145 mEq/L 143  142  141   Potassium 3.5 - 5.1 mEq/L 4.7  4.9  4.7   Chloride 96 - 112 mEq/L 107  107  104   CO2 19 - 32 mEq/L 27  29  26    Calcium 8.4 - 10.5 mg/dL 9.6  9.8  9.9   Total Protein 6.0 - 8.3 g/dL 7.6  7.8  7.8   Total Bilirubin 0.2 - 1.2 mg/dL 0.7  0.8  0.7   Alkaline Phos 39 - 117 U/L 86  102  104   AST 0 - 37 U/L 21  17  21    ALT 0 - 35 U/L 8  9  12      Lab Results  Component Value Date   WBC 6.9 11/10/2022   HGB 15.1 (H) 11/10/2022   HCT 46.9 (H) 11/10/2022   MCV  86.2 11/10/2022   PLT 180.0 11/10/2022   NEUTROABS 3.7 04/28/2021    ASSESSMENT & PLAN:  Malignant neoplasm of upper-outer quadrant of right breast in female, estrogen receptor positive (HCC) 06/27/2019: Right lumpectomy [facility]): IDC, grade 3, at least 2.5cm, involving a complex sclerosing lesion, 1.1cm, with high grade DCIS, involved margins, 1/3 right axillary lymph nodes positive, 1.4cm.  ER 100%, PR 30%, Ki-67 20%, HER-2 IHC negative T2 N1 stage IIA MammaPrint: High risk, 10-year risk of recurrence untreated: 29% 09/08/2019:Re-excision x2 (08/01/19 and 08/12/19) followed by right mastectomy [facility]): residual carcinoma and DCIS, 1/5 lymph nodes positive, clear margins.    Treatment plan: 1. Adjuvant chemotherapy with Taxotere  and Cytoxan  every 3 weeks x4 completed 12/15/2019 2.  Adjuvant radiation therapy completed 03/16/2020 3.  Follow-up adjuvant antiestrogen therapy with anastrozole  1 mg daily x7 years Genetic testing ------------------------------------------------------------------------------------------------------ Treatment plan: Plan for adjuvant antiestrogen therapy with anastrozole  x7 years started September 2021   Bilateral lower extremity  swelling: 01/26/2020: Echocardiogram EF 60 to 65% 01/21/2020: Ultrasound lower extremity: No DVT Continue with Lasix  as needed   Chemo-induced peripheral neuropathy:  Anastrozole  Toxicities: Hot flashes.  Sent prescription for Veozah .   Severe pain in the reconstructed right breast: Capsular contracture.   She had plastic surgery at [facility] where the implant was removed and DIEP flap surgery was performed   Breast Cancer Surveillance: Mammogram:  At Dr. Valri office Bone density: 11/21/2022 T-score: -1.5 (in 2022: T score -1.2:) Mild osteopenia: Calcium and vitamin D  Breast exam 06/07/2024: Benign Breast MRI 08/07/2023: Benign breast density category B   Since she had a DIEP flap I did not recommend any further MRIs. Return to clinic in 1 year for follow-up   ------------------------------------- Assessment and Plan Assessment & Plan Estrogen receptor positive right breast cancer, status post reconstruction On anastrozole  since 2021 without issues. Hot flashes managed with Veozah . Significant pain reduction post-DIEP flap reconstruction. Normal MRI in January 2025; no further MRIs needed. Eligible for clinical trial with Dr. Olam Sailors. - Continue anastrozole  1 MG oral daily. - Continue Veozah  45 MG oral daily. - Contact Dr. Olam Carrie's navigator for clinical trial appointment.  Bilateral lower extremity edema Ongoing edema. Compression socks occasionally used but difficult to wear. - Consider new compression socks that are easier to wear.  Osteopenia Bone density scan last done in May 2025. Next scan due next year. - Schedule bone density scan for next year.      No orders of the defined types were placed in this encounter.  The patient has a good understanding of the overall plan. she agrees with it. she will call with any problems that may develop before the next visit here.  I personally spent a total of 30 minutes in the care of the patient today including preparing  to see the patient, getting/reviewing separately obtained history, performing a medically appropriate exam/evaluation, counseling and educating, placing orders, referring and communicating with other health care professionals, documenting clinical information in the EHR, independently interpreting results, communicating results, and coordinating care.   Viinay K Meital Riehl, MD 06/07/24

## 2024-07-31 ENCOUNTER — Other Ambulatory Visit: Payer: Self-pay | Admitting: Hematology and Oncology

## 2024-11-24 ENCOUNTER — Ambulatory Visit: Admitting: Family Medicine

## 2025-06-07 ENCOUNTER — Inpatient Hospital Stay: Admitting: Hematology and Oncology
